# Patient Record
Sex: Female | Born: 1970
Health system: Southern US, Community
[De-identification: ages and names within clinical notes are randomized; demographics above are authoritative.]

## PROBLEM LIST (undated history)

## (undated) DIAGNOSIS — A599 Trichomoniasis, unspecified: Secondary | ICD-10-CM

## (undated) DIAGNOSIS — K219 Gastro-esophageal reflux disease without esophagitis: Secondary | ICD-10-CM

## (undated) DIAGNOSIS — Z9889 Other specified postprocedural states: Secondary | ICD-10-CM

## (undated) DIAGNOSIS — E559 Vitamin D deficiency, unspecified: Secondary | ICD-10-CM

## (undated) DIAGNOSIS — I639 Cerebral infarction, unspecified: Secondary | ICD-10-CM

## (undated) DIAGNOSIS — F4310 Post-traumatic stress disorder, unspecified: Secondary | ICD-10-CM

## (undated) DIAGNOSIS — I739 Peripheral vascular disease, unspecified: Secondary | ICD-10-CM

## (undated) DIAGNOSIS — I83891 Varicose veins of right lower extremities with other complications: Secondary | ICD-10-CM

## (undated) DIAGNOSIS — F32A Depression, unspecified: Secondary | ICD-10-CM

## (undated) DIAGNOSIS — G43909 Migraine, unspecified, not intractable, without status migrainosus: Secondary | ICD-10-CM

## (undated) DIAGNOSIS — R1031 Right lower quadrant pain: Secondary | ICD-10-CM

## (undated) DIAGNOSIS — F419 Anxiety disorder, unspecified: Secondary | ICD-10-CM

## (undated) DIAGNOSIS — E669 Obesity, unspecified: Secondary | ICD-10-CM

## (undated) DIAGNOSIS — M797 Fibromyalgia: Secondary | ICD-10-CM

## (undated) DIAGNOSIS — R112 Nausea with vomiting, unspecified: Secondary | ICD-10-CM

## (undated) DIAGNOSIS — F329 Major depressive disorder, single episode, unspecified: Secondary | ICD-10-CM

## (undated) DIAGNOSIS — M1990 Unspecified osteoarthritis, unspecified site: Secondary | ICD-10-CM

## (undated) HISTORY — DX: Right lower quadrant pain: R10.31

## (undated) HISTORY — PX: FRACTURE SURGERY: SHX138

## (undated) HISTORY — DX: Obesity, unspecified: E66.9

## (undated) HISTORY — DX: Vitamin D deficiency, unspecified: E55.9

## (undated) HISTORY — PX: VEIN SURGERY: SHX48

---

## 1997-11-10 ENCOUNTER — Other Ambulatory Visit: Admission: RE | Admit: 1997-11-10 | Discharge: 1997-11-10 | Payer: Self-pay | Admitting: Family Medicine

## 1997-11-11 ENCOUNTER — Inpatient Hospital Stay (HOSPITAL_COMMUNITY): Admission: EM | Admit: 1997-11-11 | Discharge: 1997-11-13 | Payer: Self-pay | Admitting: Emergency Medicine

## 1998-01-28 ENCOUNTER — Emergency Department (HOSPITAL_COMMUNITY): Admission: EM | Admit: 1998-01-28 | Discharge: 1998-01-28 | Payer: Self-pay | Admitting: Emergency Medicine

## 1998-12-10 ENCOUNTER — Emergency Department (HOSPITAL_COMMUNITY): Admission: EM | Admit: 1998-12-10 | Discharge: 1998-12-10 | Payer: Self-pay | Admitting: Emergency Medicine

## 1998-12-25 ENCOUNTER — Emergency Department (HOSPITAL_COMMUNITY): Admission: EM | Admit: 1998-12-25 | Discharge: 1998-12-25 | Payer: Self-pay | Admitting: Emergency Medicine

## 1999-07-19 ENCOUNTER — Encounter: Payer: Self-pay | Admitting: Surgery

## 1999-07-20 ENCOUNTER — Encounter (INDEPENDENT_AMBULATORY_CARE_PROVIDER_SITE_OTHER): Payer: Self-pay | Admitting: *Deleted

## 1999-07-20 ENCOUNTER — Ambulatory Visit (HOSPITAL_COMMUNITY): Admission: RE | Admit: 1999-07-20 | Discharge: 1999-07-20 | Payer: Self-pay | Admitting: Surgery

## 2000-01-12 ENCOUNTER — Emergency Department (HOSPITAL_COMMUNITY): Admission: EM | Admit: 2000-01-12 | Discharge: 2000-01-12 | Payer: Self-pay | Admitting: Internal Medicine

## 2000-02-21 ENCOUNTER — Emergency Department (HOSPITAL_COMMUNITY): Admission: EM | Admit: 2000-02-21 | Discharge: 2000-02-21 | Payer: Self-pay | Admitting: Emergency Medicine

## 2000-05-05 ENCOUNTER — Emergency Department (HOSPITAL_COMMUNITY): Admission: EM | Admit: 2000-05-05 | Discharge: 2000-05-06 | Payer: Self-pay | Admitting: Emergency Medicine

## 2000-05-06 ENCOUNTER — Other Ambulatory Visit: Admission: RE | Admit: 2000-05-06 | Discharge: 2000-05-06 | Payer: Self-pay | Admitting: Obstetrics

## 2002-07-11 ENCOUNTER — Emergency Department (HOSPITAL_COMMUNITY): Admission: EM | Admit: 2002-07-11 | Discharge: 2002-07-11 | Payer: Self-pay | Admitting: *Deleted

## 2002-07-11 ENCOUNTER — Encounter: Payer: Self-pay | Admitting: *Deleted

## 2003-01-05 ENCOUNTER — Emergency Department (HOSPITAL_COMMUNITY): Admission: EM | Admit: 2003-01-05 | Discharge: 2003-01-05 | Payer: Self-pay | Admitting: Emergency Medicine

## 2003-01-05 ENCOUNTER — Encounter: Payer: Self-pay | Admitting: Emergency Medicine

## 2003-08-19 ENCOUNTER — Emergency Department (HOSPITAL_COMMUNITY): Admission: EM | Admit: 2003-08-19 | Discharge: 2003-08-19 | Payer: Self-pay | Admitting: Emergency Medicine

## 2004-10-18 ENCOUNTER — Encounter: Admission: RE | Admit: 2004-10-18 | Discharge: 2004-10-18 | Payer: Self-pay | Admitting: Internal Medicine

## 2004-11-10 ENCOUNTER — Inpatient Hospital Stay (HOSPITAL_COMMUNITY): Admission: EM | Admit: 2004-11-10 | Discharge: 2004-11-13 | Payer: Self-pay | Admitting: Emergency Medicine

## 2005-07-21 ENCOUNTER — Emergency Department (HOSPITAL_COMMUNITY): Admission: EM | Admit: 2005-07-21 | Discharge: 2005-07-21 | Payer: Self-pay | Admitting: Emergency Medicine

## 2007-10-06 ENCOUNTER — Inpatient Hospital Stay (HOSPITAL_COMMUNITY): Admission: AD | Admit: 2007-10-06 | Discharge: 2007-10-06 | Payer: Self-pay | Admitting: Obstetrics & Gynecology

## 2009-07-15 DIAGNOSIS — M797 Fibromyalgia: Secondary | ICD-10-CM | POA: Insufficient documentation

## 2010-02-19 ENCOUNTER — Emergency Department (HOSPITAL_COMMUNITY): Admission: EM | Admit: 2010-02-19 | Discharge: 2010-02-20 | Payer: Self-pay | Admitting: Emergency Medicine

## 2010-07-15 DIAGNOSIS — A599 Trichomoniasis, unspecified: Secondary | ICD-10-CM

## 2010-07-15 HISTORY — DX: Trichomoniasis, unspecified: A59.9

## 2010-10-08 ENCOUNTER — Other Ambulatory Visit: Payer: Self-pay | Admitting: Obstetrics & Gynecology

## 2010-10-08 DIAGNOSIS — R1031 Right lower quadrant pain: Secondary | ICD-10-CM

## 2010-10-09 ENCOUNTER — Other Ambulatory Visit: Payer: Self-pay | Admitting: Internal Medicine

## 2010-10-09 DIAGNOSIS — K8020 Calculus of gallbladder without cholecystitis without obstruction: Secondary | ICD-10-CM

## 2010-10-12 ENCOUNTER — Inpatient Hospital Stay: Admission: RE | Admit: 2010-10-12 | Payer: Self-pay | Source: Ambulatory Visit

## 2010-10-19 ENCOUNTER — Ambulatory Visit (HOSPITAL_COMMUNITY)
Admission: RE | Admit: 2010-10-19 | Discharge: 2010-10-19 | Disposition: A | Discharge status: Home / Self Care | Payer: Self-pay | Source: Ambulatory Visit | Attending: Obstetrics & Gynecology | Admitting: Obstetrics & Gynecology

## 2010-10-19 DIAGNOSIS — R1031 Right lower quadrant pain: Secondary | ICD-10-CM | POA: Insufficient documentation

## 2010-10-19 MED ORDER — IOHEXOL 300 MG/ML  SOLN
100.0000 mL | Freq: Once | INTRAMUSCULAR | Status: AC | PRN
  Administered 2010-10-19: 100 mL via INTRAVENOUS

## 2010-11-20 ENCOUNTER — Ambulatory Visit: Payer: Self-pay | Admitting: Gastroenterology

## 2010-11-30 NOTE — Discharge Summary (Signed)
NAMEMARISELLA, PUCCIO                 ACCOUNT NO.:  [id number]   MEDICAL RECORD NO.:  [id number]          PATIENT TYPE:  INP   LOCATION:  0469                         FACILITY:  [facility]   PHYSICIAN:  Fleet Contras, M.D.    DATE OF BIRTH:  09-Oct-1970   DATE OF ADMISSION:  11/10/2004  DATE OF DISCHARGE:  11/13/2004                                 DISCHARGE SUMMARY   PRESENTATION:  Caitlin Valdez is a 40 year old African-American lady who was  admitted via the emergency room at [facility] with one day of  nausea, vomiting, and abdominal pain associated with diarrhea. She was seen  by Dr. Jeri Cos and felt to have developed some gastritis secondary  to alcohol use. She admitted to using alcohol the night before in the night  club of up to about four drinks. She has no history of peptic ulcer disease  and had not been using any NSAIDs. Due to the persistent nature of her  symptomatology, a CT scan of the abdomen was obtained, and this was  essentially within normal limits. Her initial laboratory data showed a white  count of 11.1, hemoglobin was 11.8, platelets 244, potassium 3.6, BUN 7,  creatinine 1.0, glucose 108. She was therefore admitted for intravenous IV  fluids and protein pump inhibitors.   HOSPITAL COURSE:  On admission, the patient was started on intravenous  fluids with D5 normal saline. She was initially kept on clear liquids only,  and this was slowly advanced as tolerated. Protonix 40 mg IV was started.  This was changed to p.o. She reported that she was feeling depressed, had  been out of her medication Paxil, and this was restarted. Also received  Phenergan 12.5 mg q.6h. for nausea and vomiting. Lipase and amylase were  within normal limits. Her symptoms improved, and on Nov 13, 2004, he had no  further nausea or vomiting. She still has some burning pain in her stomach.  No further diarrhea. She was tolerating full oral intake. Vital signs were  stable, and she was  well hydrated. She was slightly tender in the  epigastrium with no masses, no guarding, and no rebound. She was therefore  considered stable for discharge.   DISCHARGE DIAGNOSES:  1.  Acute alcoholic gastritis.  2.  Dehydration.  3.  History of depression.   CONDITION ON DISCHARGE:  Stable.   DISPOSITION:  For home.   FOLLOW UP:  With Dr. Fleet Contras in one week.   DISCHARGE MEDICATIONS:  1.  Protonix 40 mg once a day.  2.  Imodium 2 mg po qloose bm.   She was to continue her regular home medications that included Paxil 40 mg  once a day.      EA/MEDQ  D:  11/28/2004  T:  11/28/2004  Job:  [id number]

## 2011-01-22 ENCOUNTER — Emergency Department (HOSPITAL_COMMUNITY)
Admission: EM | Admit: 2011-01-22 | Discharge: 2011-01-22 | Disposition: A | Discharge status: Home / Self Care | Payer: Self-pay | Attending: Emergency Medicine | Admitting: Emergency Medicine

## 2011-01-22 DIAGNOSIS — R209 Unspecified disturbances of skin sensation: Secondary | ICD-10-CM | POA: Insufficient documentation

## 2011-01-22 DIAGNOSIS — IMO0001 Reserved for inherently not codable concepts without codable children: Secondary | ICD-10-CM | POA: Insufficient documentation

## 2011-01-22 DIAGNOSIS — M79609 Pain in unspecified limb: Secondary | ICD-10-CM | POA: Insufficient documentation

## 2011-03-24 ENCOUNTER — Emergency Department (HOSPITAL_COMMUNITY): Payer: Self-pay

## 2011-03-24 ENCOUNTER — Emergency Department (HOSPITAL_COMMUNITY)
Admission: EM | Admit: 2011-03-24 | Discharge: 2011-03-24 | Disposition: A | Discharge status: Home / Self Care | Payer: Self-pay | Attending: Emergency Medicine | Admitting: Emergency Medicine

## 2011-03-24 DIAGNOSIS — J069 Acute upper respiratory infection, unspecified: Secondary | ICD-10-CM | POA: Insufficient documentation

## 2011-03-24 DIAGNOSIS — IMO0001 Reserved for inherently not codable concepts without codable children: Secondary | ICD-10-CM | POA: Insufficient documentation

## 2011-03-24 DIAGNOSIS — A499 Bacterial infection, unspecified: Secondary | ICD-10-CM | POA: Insufficient documentation

## 2011-03-24 DIAGNOSIS — H9209 Otalgia, unspecified ear: Secondary | ICD-10-CM | POA: Insufficient documentation

## 2011-03-24 DIAGNOSIS — B9689 Other specified bacterial agents as the cause of diseases classified elsewhere: Secondary | ICD-10-CM | POA: Insufficient documentation

## 2011-03-24 DIAGNOSIS — N760 Acute vaginitis: Secondary | ICD-10-CM | POA: Insufficient documentation

## 2011-03-24 DIAGNOSIS — N949 Unspecified condition associated with female genital organs and menstrual cycle: Secondary | ICD-10-CM | POA: Insufficient documentation

## 2011-03-24 DIAGNOSIS — B9789 Other viral agents as the cause of diseases classified elsewhere: Secondary | ICD-10-CM | POA: Insufficient documentation

## 2011-03-24 DIAGNOSIS — N83209 Unspecified ovarian cyst, unspecified side: Secondary | ICD-10-CM | POA: Insufficient documentation

## 2011-03-24 LAB — WET PREP, GENITAL
Trich, Wet Prep: NONE SEEN
Yeast Wet Prep HPF POC: NONE SEEN

## 2011-03-24 LAB — URINALYSIS, ROUTINE W REFLEX MICROSCOPIC
Bilirubin Urine: NEGATIVE
Glucose, UA: NEGATIVE mg/dL
Ketones, ur: NEGATIVE mg/dL
Leukocytes, UA: NEGATIVE
Nitrite: NEGATIVE
Protein, ur: NEGATIVE mg/dL
Specific Gravity, Urine: 1.02 (ref 1.005–1.030)
Urobilinogen, UA: 0.2 mg/dL (ref 0.0–1.0)
pH: 6 (ref 5.0–8.0)

## 2011-03-24 LAB — DIFFERENTIAL
Basophils Absolute: 0 10*3/uL (ref 0.0–0.1)
Basophils Relative: 0 % (ref 0–1)
Eosinophils Absolute: 0.2 10*3/uL (ref 0.0–0.7)
Eosinophils Relative: 2 % (ref 0–5)
Lymphocytes Relative: 41 % (ref 12–46)
Lymphs Abs: 3.5 10*3/uL (ref 0.7–4.0)
Monocytes Absolute: 1 10*3/uL (ref 0.1–1.0)
Monocytes Relative: 11 % (ref 3–12)
Neutro Abs: 3.8 10*3/uL (ref 1.7–7.7)
Neutrophils Relative %: 45 % (ref 43–77)

## 2011-03-24 LAB — CBC
HCT: 39.6 % (ref 36.0–46.0)
Hemoglobin: 14 g/dL (ref 12.0–15.0)
MCH: 33.2 pg (ref 26.0–34.0)
MCHC: 35.4 g/dL (ref 30.0–36.0)
MCV: 93.8 fL (ref 78.0–100.0)
Platelets: 277 10*3/uL (ref 150–400)
RBC: 4.22 MIL/uL (ref 3.87–5.11)
RDW: 12.7 % (ref 11.5–15.5)
WBC: 8.6 10*3/uL (ref 4.0–10.5)

## 2011-03-24 LAB — BASIC METABOLIC PANEL
BUN: 11 mg/dL (ref 6–23)
CO2: 25 mEq/L (ref 19–32)
Calcium: 9.7 mg/dL (ref 8.4–10.5)
Chloride: 99 mEq/L (ref 96–112)
Creatinine, Ser: 0.78 mg/dL (ref 0.50–1.10)
GFR calc Af Amer: >60 mL/min (ref >60)
GFR calc non Af Amer: >60 mL/min (ref >60)
Glucose, Bld: 91 mg/dL (ref 70–99)
Potassium: 4.1 mEq/L (ref 3.5–5.1)
Sodium: 133 mEq/L — ABNORMAL LOW (ref 135–145)

## 2011-03-24 LAB — POCT PREGNANCY, URINE: Preg Test, Ur: NEGATIVE

## 2011-03-24 LAB — URINE MICROSCOPIC-ADD ON

## 2011-03-25 LAB — GC/CHLAMYDIA PROBE AMP, GENITAL
Chlamydia, DNA Probe: NEGATIVE
GC Probe Amp, Genital: NEGATIVE

## 2011-04-08 LAB — URINALYSIS, ROUTINE W REFLEX MICROSCOPIC
Bilirubin Urine: NEGATIVE
Glucose, UA: NEGATIVE
Ketones, ur: NEGATIVE
Leukocytes, UA: NEGATIVE
Nitrite: NEGATIVE
Protein, ur: NEGATIVE
Specific Gravity, Urine: 1.025
Urobilinogen, UA: 0.2
pH: 6

## 2011-04-08 LAB — URINE MICROSCOPIC-ADD ON

## 2011-04-08 LAB — POCT PREGNANCY, URINE
Operator id: 22333
Preg Test, Ur: NEGATIVE

## 2011-06-19 ENCOUNTER — Encounter (HOSPITAL_COMMUNITY): Payer: Self-pay | Admitting: Emergency Medicine

## 2011-06-19 ENCOUNTER — Emergency Department (HOSPITAL_COMMUNITY)
Admission: EM | Admit: 2011-06-19 | Discharge: 2011-06-19 | Disposition: A | Discharge status: Home / Self Care | Payer: Self-pay | Attending: Emergency Medicine | Admitting: Emergency Medicine

## 2011-06-19 DIAGNOSIS — B86 Scabies: Secondary | ICD-10-CM | POA: Insufficient documentation

## 2011-06-19 DIAGNOSIS — F17200 Nicotine dependence, unspecified, uncomplicated: Secondary | ICD-10-CM | POA: Insufficient documentation

## 2011-06-19 DIAGNOSIS — M5412 Radiculopathy, cervical region: Secondary | ICD-10-CM | POA: Insufficient documentation

## 2011-06-19 HISTORY — DX: Fibromyalgia: M79.7

## 2011-06-19 MED ORDER — PREDNISONE 50 MG PO TABS: 50.0000 mg | ORAL_TABLET | Freq: Every day | ORAL | Status: AC

## 2011-06-19 MED ORDER — NAPROXEN 500 MG PO TABS: 500.0000 mg | ORAL_TABLET | Freq: Two times a day (BID) | ORAL | Status: DC

## 2011-06-19 MED ORDER — PERMETHRIN 5 % EX CREA: TOPICAL_CREAM | CUTANEOUS | Status: AC

## 2011-06-19 MED ORDER — HYDROCODONE-ACETAMINOPHEN 5-325 MG PO TABS: 1.0000 | ORAL_TABLET | ORAL | Status: AC | PRN

## 2011-06-19 MED ORDER — DIAZEPAM 5 MG PO TABS: 5.0000 mg | ORAL_TABLET | Freq: Two times a day (BID) | ORAL | Status: AC

## 2011-06-19 MED ORDER — PREDNISONE 20 MG PO TABS
60.0000 mg | ORAL_TABLET | Freq: Once | ORAL | Status: AC
  Administered 2011-06-19: 60 mg via ORAL
  Filled 2011-06-19: qty 3

## 2011-06-19 NOTE — ED Notes (Signed)
States that she has a tingling sensation that originates in her hands, up her arms, down to legs. States that she also has some areas of dry skin generalized over her body that itch. States that when she got into a tub of water to soothe tingling in body she felt as though her body was on fire.

## 2011-06-19 NOTE — ED Notes (Signed)
Pt reports one week of tingling and burning in left extremities; Pt also has small dark "sores" developing on various parts of body; reports she is concerned she has lupus

## 2011-06-19 NOTE — ED Notes (Signed)
Pt. Resting quietly. Awaiting initial MD assessment. Continues to c/o tingling to L arm.

## 2011-06-19 NOTE — ED Provider Notes (Signed)
History     CSN: [id number] Arrival date & time: 06/19/2011  4:51 PM   First MD Initiated Contact with Patient 06/19/11 1904      Chief Complaint  Patient presents with  . Skin Ulcer  . Tingling    (Consider location/radiation/quality/duration/timing/severity/associated sxs/prior treatment) HPI Comments: Patient complains of one to 2 weeks of diffuse rash over her body that is pruritic in nature.  She states she initially noticed it between her fingers and on her heels.  She is now noted coming up her wrists upper arms and on her back as well.  She has no fevers, nausea, vomiting, abdominal pain or other systemic symptoms of illness with this.  Patient has not tried any medications to help with this besides moisturizers at home.  Patient secondarily also complains of left arm tingling and pain.  She notes that certain positions make it worse such as raising her arm overhead.  She's not a trauma or injuries.  No prior neck surgeries or injuries.  Patient has no weakness in that arm but is noting that she's difficulty sleeping due to the pain.  Patient is a 40 y.o. female presenting with rash. The history is provided by the patient.  Rash  This is a new problem. The problem has been gradually worsening. The problem is associated with nothing. There has been no fever.    Past Medical History  Diagnosis Date  . Abdominal pain, right lower quadrant   . Fibromyalgia     Past Surgical History  Procedure Date  . Cesarean section   . Vein surgery   . Fracture surgery     No family history on file.  History  Substance Use Topics  . Smoking status: Current Everyday Smoker -- 1.0 packs/day  . Smokeless tobacco: Not on file  . Alcohol Use: No    OB History    Grav Para Term Preterm Abortions TAB SAB Ect Mult Living                  Review of Systems  Constitutional: Negative.  Negative for fever and chills.  HENT: Negative.   Eyes: Negative.  Negative for discharge and  redness.  Respiratory: Negative.  Negative for cough and shortness of breath.   Cardiovascular: Negative.  Negative for chest pain.  Gastrointestinal: Negative.  Negative for nausea, vomiting, abdominal pain and diarrhea.  Genitourinary: Negative.  Negative for dysuria and vaginal discharge.  Musculoskeletal: Negative for back pain.  Skin: Positive for rash. Negative for color change.  Neurological: Negative.  Negative for syncope and headaches.  Hematological: Negative.  Negative for adenopathy.  Psychiatric/Behavioral: Negative.  Negative for confusion.  All other systems reviewed and are negative.    Allergies  Review of patient's allergies indicates no known allergies.  Home Medications   Current Outpatient Rx  Name Route Sig Dispense Refill  . CYCLOBENZAPRINE HCL 10 MG PO TABS Oral Take 10 mg by mouth 3 (three) times daily as needed. For pain.    [name] GABAPENTIN 300 MG PO CAPS Oral Take 300 mg by mouth 3 (three) times daily.      Carma Leaven M PLUS PO TABS Oral Take 1 tablet by mouth daily.        BP 136/90  Pulse 85  Temp(Src) 97.9 F (36.6 C) (Oral)  Resp 18  Ht 5\' 3"  (1.6 m)  Wt 165 lb (74.844 kg)  BMI 29.23 kg/m2  SpO2 100%  LMP 05/26/2011  Physical Exam  Constitutional: She  is oriented to person, place, and time. She appears well-developed and well-nourished.  Non-toxic appearance. She does not have a sickly appearance.  HENT:  Head: Normocephalic and atraumatic.  Eyes: Conjunctivae, EOM and lids are normal. Pupils are equal, round, and reactive to light. No scleral icterus.  Neck: Trachea normal and normal range of motion. Neck supple.  Cardiovascular: Normal rate, regular rhythm and normal heart sounds.   Pulmonary/Chest: Effort normal and breath sounds normal. No respiratory distress. She has no wheezes. She has no rales.  Abdominal: Soft. Normal appearance. There is no tenderness. There is no rebound, no guarding and no CVA tenderness.  Musculoskeletal: Normal  range of motion.       Patient with no significant change in pain with axial loading but does have worsening left arm pain with raising her arm above shoulder height.  Palpable radial pulse left arm.  Capillary refill less than 2 seconds.  Sensation is intact.  Neurological: She is alert and oriented to person, place, and time. She has normal strength.  Skin: Skin is warm, dry and intact. Rash noted.       Patient has diffuse rash between her fingers on her wrists and upper arms, legs and on her back.  There is no significant fluctuance or erythema to these wounds.  They're small bumps with mild excoriation and a few spots where she admits to scratching more frequent  Psychiatric: She has a normal mood and affect. Her behavior is normal. Thought content normal.    ED Course  Procedures (including critical care time)  Labs Reviewed - No data to display No results found.   No diagnosis found.    MDM  Patient presents with symptoms concerning for possible scabies.  Patient has diffuse rash and itching that she initially noticed between her fingers and toes.  Patient has no fevers or other systemic signs of illness.  I will treat patient with permethrin cream.  She secondarily also notes some tingling and pain down her left arm that is worse with certain positions such as raising her arm overhead.  This could be consistent with a possible cervical radiculopathy patient has no weakness or neurologic deficit at this time to necessitate emergent imaging.  Will place patient on steroids and pain medication and I've advised her to followup with her primary care physician if this is not improving.        Nat Christen, MD 06/19/11 [phone]

## 2011-06-19 NOTE — ED Notes (Signed)
MD at bedside. 

## 2011-08-12 ENCOUNTER — Encounter (HOSPITAL_COMMUNITY): Payer: Self-pay | Admitting: Emergency Medicine

## 2011-08-12 ENCOUNTER — Emergency Department (HOSPITAL_COMMUNITY)
Admission: EM | Admit: 2011-08-12 | Discharge: 2011-08-12 | Disposition: A | Discharge status: Home / Self Care | Payer: Self-pay | Attending: Emergency Medicine | Admitting: Emergency Medicine

## 2011-08-12 DIAGNOSIS — F17200 Nicotine dependence, unspecified, uncomplicated: Secondary | ICD-10-CM | POA: Insufficient documentation

## 2011-08-12 DIAGNOSIS — R112 Nausea with vomiting, unspecified: Secondary | ICD-10-CM | POA: Insufficient documentation

## 2011-08-12 LAB — DIFFERENTIAL
Basophils Absolute: 0 10*3/uL (ref 0.0–0.1)
Basophils Relative: 0 % (ref 0–1)
Eosinophils Absolute: 0 10*3/uL (ref 0.0–0.7)
Eosinophils Relative: 1 % (ref 0–5)
Lymphocytes Relative: 12 % (ref 12–46)
Lymphs Abs: 0.7 10*3/uL (ref 0.7–4.0)
Monocytes Absolute: 0.5 10*3/uL (ref 0.1–1.0)
Monocytes Relative: 9 % (ref 3–12)
Neutro Abs: 4.7 10*3/uL (ref 1.7–7.7)
Neutrophils Relative %: 79 % — ABNORMAL HIGH (ref 43–77)

## 2011-08-12 LAB — URINALYSIS, ROUTINE W REFLEX MICROSCOPIC
Bilirubin Urine: NEGATIVE
Glucose, UA: NEGATIVE mg/dL
Leukocytes, UA: NEGATIVE
Nitrite: NEGATIVE
Protein, ur: NEGATIVE mg/dL
Specific Gravity, Urine: 1.026 (ref 1.005–1.030)
Urobilinogen, UA: 0.2 mg/dL (ref 0.0–1.0)
pH: 5.5 (ref 5.0–8.0)

## 2011-08-12 LAB — COMPREHENSIVE METABOLIC PANEL
ALT: 17 U/L (ref 0–35)
AST: 21 U/L (ref 0–37)
Albumin: 4.1 g/dL (ref 3.5–5.2)
Alkaline Phosphatase: 86 U/L (ref 39–117)
BUN: 9 mg/dL (ref 6–23)
CO2: 23 mEq/L (ref 19–32)
Calcium: 9.5 mg/dL (ref 8.4–10.5)
Chloride: 100 mEq/L (ref 96–112)
Creatinine, Ser: 0.76 mg/dL (ref 0.50–1.10)
GFR calc Af Amer: >90 mL/min (ref >90)
GFR calc non Af Amer: >90 mL/min (ref >90)
Glucose, Bld: 90 mg/dL (ref 70–99)
Potassium: 4.2 mEq/L (ref 3.5–5.1)
Sodium: 133 mEq/L — ABNORMAL LOW (ref 135–145)
Total Bilirubin: 0.3 mg/dL (ref 0.3–1.2)
Total Protein: 8.3 g/dL (ref 6.0–8.3)

## 2011-08-12 LAB — CBC
HCT: 37.8 % (ref 36.0–46.0)
Hemoglobin: 12.7 g/dL (ref 12.0–15.0)
MCH: 31.4 pg (ref 26.0–34.0)
MCHC: 33.6 g/dL (ref 30.0–36.0)
MCV: 93.3 fL (ref 78.0–100.0)
Platelets: 241 10*3/uL (ref 150–400)
RBC: 4.05 MIL/uL (ref 3.87–5.11)
RDW: 12 % (ref 11.5–15.5)
WBC: 5.9 10*3/uL (ref 4.0–10.5)

## 2011-08-12 LAB — URINE MICROSCOPIC-ADD ON

## 2011-08-12 LAB — PREGNANCY, URINE: Preg Test, Ur: NEGATIVE

## 2011-08-12 MED ORDER — ONDANSETRON HCL 4 MG/2ML IJ SOLN
4.0000 mg | Freq: Once | INTRAMUSCULAR | Status: AC
  Administered 2011-08-12: 4 mg via INTRAVENOUS
  Filled 2011-08-12: qty 2

## 2011-08-12 MED ORDER — DIPHENHYDRAMINE HCL 50 MG/ML IJ SOLN
25.0000 mg | Freq: Once | INTRAMUSCULAR | Status: AC
  Administered 2011-08-12: 25 mg via INTRAVENOUS
  Filled 2011-08-12: qty 1

## 2011-08-12 MED ORDER — DROPERIDOL 2.5 MG/ML IJ SOLN
1.2500 mg | Freq: Once | INTRAMUSCULAR | Status: AC
  Administered 2011-08-12: 1.25 mg via INTRAVENOUS
  Filled 2011-08-12: qty 0.5

## 2011-08-12 MED ORDER — LORAZEPAM 1 MG PO TABS
1.0000 mg | ORAL_TABLET | Freq: Once | ORAL | Status: AC
  Administered 2011-08-12: 1 mg via ORAL
  Filled 2011-08-12: qty 1

## 2011-08-12 MED ORDER — PANTOPRAZOLE SODIUM 40 MG IV SOLR
40.0000 mg | Freq: Once | INTRAVENOUS | Status: AC
  Administered 2011-08-12: 40 mg via INTRAVENOUS
  Filled 2011-08-12: qty 40

## 2011-08-12 MED ORDER — ONDANSETRON HCL 4 MG PO TABS: 4.0000 mg | ORAL_TABLET | Freq: Four times a day (QID) | ORAL | Status: AC

## 2011-08-12 MED ORDER — KETOROLAC TROMETHAMINE 30 MG/ML IJ SOLN
30.0000 mg | Freq: Once | INTRAMUSCULAR | Status: AC
  Administered 2011-08-12: 30 mg via INTRAVENOUS
  Filled 2011-08-12: qty 1

## 2011-08-12 NOTE — ED Notes (Signed)
Per ems, the patient ate some food that she cooked last night and was fine with it until 3pm today when she ate it.. States took 8 mg zofran at home prior to coming in this evening  1830.

## 2011-08-12 NOTE — ED Provider Notes (Signed)
History     CSN: [id number]  Arrival date & time 08/12/11  1903   First MD Initiated Contact with Patient 08/12/11 1915      Chief Complaint  Patient presents with  . Emesis    (Consider location/radiation/quality/duration/timing/severity/associated sxs/prior treatment) HPI Comments: Patient arrives via EMS with nausea and vomiting started around 3 PM today states she ate some ribs that she cut herself. No one else has gotten sick. She denies any diarrhea, abdominal pain, fever, chest pain or shortness of breath. She denies any difficulty with urination about vaginal bleeding or discharge. She's vomited countless times and seen some streaks of blood.  The history is provided by the patient.    Past Medical History  Diagnosis Date  . Abdominal pain, right lower quadrant   . Fibromyalgia     Past Surgical History  Procedure Date  . Cesarean section   . Vein surgery   . Fracture surgery     History reviewed. No pertinent family history.  History  Substance Use Topics  . Smoking status: Current Everyday Smoker -- 1.0 packs/day  . Smokeless tobacco: Not on file  . Alcohol Use: No    OB History    Grav Para Term Preterm Abortions TAB SAB Ect Mult Living                  Review of Systems  Constitutional: Positive for activity change and appetite change. Negative for fever and fatigue.  HENT: Negative for congestion and rhinorrhea.   Respiratory: Negative for cough, chest tightness and shortness of breath.   Cardiovascular: Negative for chest pain.  Gastrointestinal: Positive for nausea and vomiting. Negative for abdominal pain and diarrhea.  Genitourinary: Negative for dysuria, hematuria, vaginal bleeding and vaginal discharge.  Musculoskeletal: Negative for back pain.  Skin: Negative for rash.  Neurological: Negative for weakness and headaches.    Allergies  Review of patient's allergies indicates no known allergies.  Home Medications   Current Outpatient  Rx  Name Route Sig Dispense Refill  . CYCLOBENZAPRINE HCL 10 MG PO TABS Oral Take 10 mg by mouth 3 (three) times daily as needed. For pain.    [name] GABAPENTIN 300 MG PO CAPS Oral Take 300 mg by mouth 3 (three) times daily.      Carma Leaven M PLUS PO TABS Oral Take 1 tablet by mouth daily.      [name] NAPROXEN 500 MG PO TABS Oral Take 1 tablet (500 mg total) by mouth 2 (two) times daily. 30 tablet 0  . ONDANSETRON HCL 4 MG PO TABS Oral Take 1 tablet (4 mg total) by mouth every 6 (six) hours. 12 tablet 0    BP 107/57  Pulse 93  Temp(Src) 98.3 F (36.8 C) (Oral)  Resp 20  SpO2 100%  Physical Exam  Constitutional: She is oriented to person, place, and time. She appears well-developed and well-nourished. No distress.  HENT:  Head: Normocephalic and atraumatic.  Mouth/Throat: Oropharynx is clear and moist. No oropharyngeal exudate.  Eyes: Conjunctivae are normal. Pupils are equal, round, and reactive to light.  Neck: Normal range of motion. Neck supple.  Cardiovascular: Normal rate, regular rhythm and normal heart sounds.   Pulmonary/Chest: Effort normal and breath sounds normal. No respiratory distress.  Abdominal: Soft. There is no tenderness. There is no rebound and no guarding.  Musculoskeletal: Normal range of motion. She exhibits no edema and no tenderness.  Neurological: She is alert and oriented to person, place, and time. No cranial  nerve deficit.  Skin: Skin is warm.    ED Course  Procedures (including critical care time)  Labs Reviewed  DIFFERENTIAL - Abnormal; Notable for the following:    Neutrophils Relative 79 (*)    All other components within normal limits  COMPREHENSIVE METABOLIC PANEL - Abnormal; Notable for the following:    Sodium 133 (*)    All other components within normal limits  URINALYSIS, ROUTINE W REFLEX MICROSCOPIC - Abnormal; Notable for the following:    Hgb urine dipstick LARGE (*)    Ketones, ur TRACE (*)    All other components within normal limits  URINE  MICROSCOPIC-ADD ON - Abnormal; Notable for the following:    Squamous Epithelial / LPF FEW (*)    Bacteria, UA MANY (*)    All other components within normal limits  CBC  PREGNANCY, URINE   No results found.   1. Nausea and vomiting       MDM  Nausea and vomiting. Abdominal exam is soft and benign.  We'll initiate IV hydration, antiemetics.  Patient given IV hydration and his has tolerated by mouth in ED. No further vomiting. Abdomen is soft and nontender.       Glynn Octave, MD 08/12/11 [phone]

## 2011-08-12 NOTE — Discharge Instructions (Signed)
Nausea and Vomiting Nausea is a sick feeling that often comes before throwing up (vomiting). Vomiting is a reflex where stomach contents come out of your mouth. Vomiting can cause severe loss of body fluids (dehydration). Children and elderly adults can become dehydrated quickly, especially if they also have diarrhea. Nausea and vomiting are symptoms of a condition or disease. It is important to find the cause of your symptoms. CAUSES   Direct irritation of the stomach lining. This irritation can result from increased acid production (gastroesophageal reflux disease), infection, food poisoning, taking certain medicines (such as nonsteroidal anti-inflammatory drugs), alcohol use, or tobacco use.   Signals from the brain.These signals could be caused by a headache, heat exposure, an inner ear disturbance, increased pressure in the brain from injury, infection, a tumor, or a concussion, pain, emotional stimulus, or metabolic problems.   An obstruction in the gastrointestinal tract (bowel obstruction).   Illnesses such as diabetes, hepatitis, gallbladder problems, appendicitis, kidney problems, cancer, sepsis, atypical symptoms of a heart attack, or eating disorders.   Medical treatments such as chemotherapy and radiation.   Receiving medicine that makes you sleep (general anesthetic) during surgery.  DIAGNOSIS Your caregiver may ask for tests to be done if the problems do not improve after a few days. Tests may also be done if symptoms are severe or if the reason for the nausea and vomiting is not clear. Tests may include:  Urine tests.   Blood tests.   Stool tests.   Cultures (to look for evidence of infection).   X-rays or other imaging studies.  Test results can help your caregiver make decisions about treatment or the need for additional tests. TREATMENT You need to stay well hydrated. Drink frequently but in small amounts.You may wish to drink water, sports drinks, clear broth, or  eat frozen ice pops or gelatin dessert to help stay hydrated.When you eat, eating slowly may help prevent nausea.There are also some antinausea medicines that may help prevent nausea. HOME CARE INSTRUCTIONS   Take all medicine as directed by your caregiver.   If you do not have an appetite, do not force yourself to eat. However, you must continue to drink fluids.   If you have an appetite, eat a normal diet unless your caregiver tells you differently.   Eat a variety of complex carbohydrates (rice, wheat, potatoes, bread), lean meats, yogurt, fruits, and vegetables.   Avoid high-fat foods because they are more difficult to digest.   Drink enough water and fluids to keep your urine clear or pale yellow.   If you are dehydrated, ask your caregiver for specific rehydration instructions. Signs of dehydration may include:   Severe thirst.   Dry lips and mouth.   Dizziness.   Dark urine.   Decreasing urine frequency and amount.   Confusion.   Rapid breathing or pulse.  SEEK IMMEDIATE MEDICAL CARE IF:   You have blood or brown flecks (like coffee grounds) in your vomit.   You have black or bloody stools.   You have a severe headache or stiff neck.   You are confused.   You have severe abdominal pain.   You have chest pain or trouble breathing.   You do not urinate at least once every 8 hours.   You develop cold or clammy skin.   You continue to vomit for longer than 24 to 48 hours.   You have a fever.  MAKE SURE YOU:   Understand these instructions.   Will watch your   condition.   Will get help right away if you are not doing well or get worse.  Document Released: 07/01/2005 Document Revised: 03/13/2011 Document Reviewed: 11/28/2010 [employer] Patient Information 2012 [employer]. 

## 2011-08-12 NOTE — ED Notes (Signed)
Patient discharged home with son

## 2011-12-18 ENCOUNTER — Encounter (HOSPITAL_COMMUNITY): Payer: Self-pay

## 2011-12-18 ENCOUNTER — Emergency Department (HOSPITAL_COMMUNITY)
Admission: EM | Admit: 2011-12-18 | Discharge: 2011-12-18 | Disposition: A | Discharge status: Home / Self Care | Payer: Self-pay | Attending: Emergency Medicine | Admitting: Emergency Medicine

## 2011-12-18 ENCOUNTER — Emergency Department (HOSPITAL_COMMUNITY): Payer: Self-pay

## 2011-12-18 DIAGNOSIS — R110 Nausea: Secondary | ICD-10-CM | POA: Insufficient documentation

## 2011-12-18 DIAGNOSIS — B86 Scabies: Secondary | ICD-10-CM | POA: Insufficient documentation

## 2011-12-18 DIAGNOSIS — F17200 Nicotine dependence, unspecified, uncomplicated: Secondary | ICD-10-CM | POA: Insufficient documentation

## 2011-12-18 DIAGNOSIS — R51 Headache: Secondary | ICD-10-CM | POA: Insufficient documentation

## 2011-12-18 HISTORY — DX: Migraine, unspecified, not intractable, without status migrainosus: G43.909

## 2011-12-18 LAB — URINE MICROSCOPIC-ADD ON

## 2011-12-18 LAB — POCT PREGNANCY, URINE: Preg Test, Ur: NEGATIVE

## 2011-12-18 LAB — URINALYSIS, ROUTINE W REFLEX MICROSCOPIC
Bilirubin Urine: NEGATIVE
Glucose, UA: NEGATIVE mg/dL
Ketones, ur: NEGATIVE mg/dL
Leukocytes, UA: NEGATIVE
Nitrite: NEGATIVE
Protein, ur: NEGATIVE mg/dL
Specific Gravity, Urine: 1.019 (ref 1.005–1.030)
Urobilinogen, UA: 0.2 mg/dL (ref 0.0–1.0)
pH: 5.5 (ref 5.0–8.0)

## 2011-12-18 LAB — POCT I-STAT, CHEM 8
BUN: 4 mg/dL — ABNORMAL LOW (ref 6–23)
Calcium, Ion: 1.28 mmol/L (ref 1.12–1.32)
Chloride: 106 mEq/L (ref 96–112)
Creatinine, Ser: 0.8 mg/dL (ref 0.50–1.10)
Glucose, Bld: 87 mg/dL (ref 70–99)
HCT: 41 % (ref 36.0–46.0)
Hemoglobin: 13.9 g/dL (ref 12.0–15.0)
Potassium: 4.3 mEq/L (ref 3.5–5.1)
Sodium: 141 mEq/L (ref 135–145)
TCO2: 25 mmol/L (ref 0–100)

## 2011-12-18 MED ORDER — PERMETHRIN 5 % EX CREA: TOPICAL_CREAM | CUTANEOUS | Status: AC

## 2011-12-18 MED ORDER — BUTALBITAL-APAP-CAFFEINE 50-325-40 MG PO TABS: 1.0000 | ORAL_TABLET | Freq: Four times a day (QID) | ORAL | Status: DC | PRN

## 2011-12-18 NOTE — Discharge Instructions (Signed)

## 2011-12-18 NOTE — ED Notes (Signed)
Pt informed Caitlin Valdez, Charity fundraiser, that she had run out of her headache medications and had a migraine and that is why she is here.

## 2011-12-18 NOTE — ED Notes (Signed)
Pt given ginger ale and crackers per request.  

## 2011-12-18 NOTE — ED Notes (Signed)
Pt. Had a white drainage coming from her breast yesterday and they are tender

## 2011-12-18 NOTE — ED Notes (Signed)
Pt returned from CT scan; asking for ginger ale with no ice

## 2011-12-18 NOTE — ED Provider Notes (Signed)
History     CSN: [id number]  Arrival date & time 12/18/11  1019   None     Chief Complaint  Patient presents with  . Headache    (Consider location/radiation/quality/duration/timing/severity/associated sxs/prior treatment) Patient is a 41 y.o. female presenting with headaches. The history is provided by the patient. A language interpreter was used.  Headache  This is a new problem. The problem has been gradually worsening. The headache is associated with bright light. The pain is located in the frontal region. The quality of the pain is described as sharp. The pain is at a severity of 8/10. The pain is moderate. The pain radiates to the upper back. Associated symptoms include nausea. Pertinent negatives include no anorexia, no orthopnea and no palpitations. She has tried nothing for the symptoms.  Pt has multiple concerns.  Pt has an itching rash on fingers.  Pt has a history of scabies earlier this year.  Pt is worried that she is diabetic.  Pt is worried that she has lupus. Pt concerned about headaches.  Pt has seen Dr. Concepcion Elk in the past.  Pt also reports notice milk from breast when she squeezes.    Past Medical History  Diagnosis Date  . Abdominal pain, right lower quadrant   . Fibromyalgia   . Migraine     Past Surgical History  Procedure Date  . Cesarean section   . Vein surgery   . Fracture surgery     History reviewed. No pertinent family history.  History  Substance Use Topics  . Smoking status: Current Everyday Smoker -- 1.0 packs/day  . Smokeless tobacco: Not on file  . Alcohol Use: No    OB History    Grav Para Term Preterm Abortions TAB SAB Ect Mult Living                  Review of Systems  Constitutional: Positive for fatigue.  Cardiovascular: Negative for palpitations and orthopnea.  Gastrointestinal: Positive for nausea. Negative for anorexia.  Skin: Positive for rash.  Neurological: Positive for light-headedness and headaches.    Allergies    Review of patient's allergies indicates no known allergies.  Home Medications   Current Outpatient Rx  Name Route Sig Dispense Refill  . EXCEDRIN MIGRAINE PO Oral Take 1-2 tablets by mouth 3 (three) times daily as needed. For headache    . GABAPENTIN 300 MG PO CAPS Oral Take 300 mg by mouth 3 (three) times daily.        BP 120/62  Pulse 75  Temp(Src) 98.7 F (37.1 C) (Oral)  Resp 18  SpO2 99%  LMP 12/06/2011  Physical Exam  Nursing note and vitals reviewed. Constitutional: She is oriented to person, place, and time. She appears well-developed and well-nourished.  HENT:  Head: Normocephalic and atraumatic.  Right Ear: External ear normal.  Left Ear: External ear normal.  Nose: Nose normal.  Mouth/Throat: Oropharynx is clear and moist.  Eyes: Conjunctivae and EOM are normal. Pupils are equal, round, and reactive to light.  Neck: Normal range of motion. Neck supple.  Cardiovascular: Normal rate and normal heart sounds.   Pulmonary/Chest: Effort normal and breath sounds normal.  Abdominal: Soft. Bowel sounds are normal.  Musculoskeletal: Normal range of motion.  Neurological: She is alert and oriented to person, place, and time. She has normal reflexes. She displays normal reflexes. No cranial nerve deficit. Coordination normal.  Skin: Skin is warm and dry.       Rash between fingers and  on hands  Psychiatric: She has a normal mood and affect.    ED Course  Procedures (including critical care time)   Labs Reviewed  POCT PREGNANCY, URINE  URINALYSIS, ROUTINE W REFLEX MICROSCOPIC   No results found.   No diagnosis found.    MDM  Rash looks like scabies,   Pt's labs are normal.   Glucose is 87.  I advised we do not do lupus testing in ED.  Ct scan reviewed.  I advised follow up with Dr. Concepcion Elk for recheck, referral for mamogram, lupus testing.   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

## 2011-12-18 NOTE — ED Notes (Signed)
Pt. Having intermittent headaches since May 5th, hx of migraines and fibromyalgia also was in her garden this week and now has a rash on her hands.

## 2011-12-18 NOTE — ED Notes (Signed)
Pt. Also having vaginal discharge

## 2012-01-20 ENCOUNTER — Inpatient Hospital Stay (HOSPITAL_COMMUNITY)
Admission: AD | Admit: 2012-01-20 | Discharge: 2012-01-20 | Disposition: A | Discharge status: Home / Self Care | Payer: Self-pay | Source: Ambulatory Visit | Attending: Obstetrics & Gynecology | Admitting: Obstetrics & Gynecology

## 2012-01-20 ENCOUNTER — Encounter (HOSPITAL_COMMUNITY): Payer: Self-pay | Admitting: *Deleted

## 2012-01-20 DIAGNOSIS — N760 Acute vaginitis: Secondary | ICD-10-CM | POA: Insufficient documentation

## 2012-01-20 DIAGNOSIS — B9689 Other specified bacterial agents as the cause of diseases classified elsewhere: Secondary | ICD-10-CM | POA: Insufficient documentation

## 2012-01-20 DIAGNOSIS — R109 Unspecified abdominal pain: Secondary | ICD-10-CM | POA: Insufficient documentation

## 2012-01-20 DIAGNOSIS — A499 Bacterial infection, unspecified: Secondary | ICD-10-CM

## 2012-01-20 HISTORY — DX: Trichomoniasis, unspecified: A59.9

## 2012-01-20 LAB — WET PREP, GENITAL
Trich, Wet Prep: NONE SEEN
Yeast Wet Prep HPF POC: NONE SEEN

## 2012-01-20 LAB — URINALYSIS, ROUTINE W REFLEX MICROSCOPIC
Bilirubin Urine: NEGATIVE
Glucose, UA: NEGATIVE mg/dL
Ketones, ur: NEGATIVE mg/dL
Leukocytes, UA: NEGATIVE
Nitrite: NEGATIVE
Protein, ur: NEGATIVE mg/dL
Specific Gravity, Urine: <1.005 — ABNORMAL LOW (ref 1.005–1.030)
Urobilinogen, UA: 0.2 mg/dL (ref 0.0–1.0)
pH: 6.5 (ref 5.0–8.0)

## 2012-01-20 LAB — POCT PREGNANCY, URINE: Preg Test, Ur: NEGATIVE

## 2012-01-20 LAB — URINE MICROSCOPIC-ADD ON

## 2012-01-20 MED ORDER — METRONIDAZOLE 500 MG PO TABS: 500.0000 mg | ORAL_TABLET | Freq: Two times a day (BID) | ORAL | Status: AC

## 2012-01-20 NOTE — MAU Note (Signed)
Pt shut rt index finger in door last Thurs.  Pt able to move finger and swelling has decrease but would like the nail looked at.  Nail is trying to come off.

## 2012-01-20 NOTE — MAU Provider Note (Signed)
History     CSN: [id number]  Arrival date & time 01/20/12  0948   None     Chief Complaint  Patient presents with  . Abdominal Pain  . Nausea  . Nail Problem   HPI Caitlin Valdez is a 41 y.o. female who presents to MAU for abdominal pain. The pain started this morning about 6 o'clock. Woke with the pain. She describes the pain as sharp that comes and goes. She rates the pain as 7/10 when it comes lasting for a few seconds.  Frequent urination but no pain. Nausea but no vomiting. Had sex with a friend 4 days ago and he removed the condom without the patient knowing. She is having some vaginal discharge since then and now the abdominal pain. No birth control other than condoms. Patient has been very upset since the episode and states she has been crying all weekend. LMP 01/06/12, last pap smear one year ago and was normal. Hx of trichomonas last year. The history was provided by the patient.  BP 132/96  Pulse 75  Temp 98 F (36.7 C) (Oral)  Resp 16  Ht 5\' 3"  (1.6 m)  Wt 150 lb (68.04 kg)  BMI 26.57 kg/m2  SpO2 100%  LMP 01/06/2012   Past Medical History  Diagnosis Date  . Abdominal pain, right lower quadrant   . Fibromyalgia   . Migraine     Past Surgical History  Procedure Date  . Cesarean section   . Vein surgery   . Fracture surgery     Family History  Problem Relation Age of Onset  . Other Neg Hx     History  Substance Use Topics  . Smoking status: Current Everyday Smoker -- 0.2 packs/day    Types: Cigarettes  . Smokeless tobacco: Never Used  . Alcohol Use: No    OB History    Grav Para Term Preterm Abortions TAB SAB Ect Mult Living   2 2 2       2       Review of Systems  Constitutional: Negative for fever, chills, diaphoresis and fatigue.  HENT: Negative for ear pain, congestion, sore throat, facial swelling, neck pain, neck stiffness, dental problem and sinus pressure.   Eyes: Negative for photophobia, pain, discharge and visual disturbance.    Respiratory: Negative for cough, chest tightness and wheezing.   Cardiovascular: Negative for chest pain and palpitations.  Gastrointestinal: Positive for nausea and abdominal pain. Negative for vomiting, diarrhea, constipation and abdominal distention.  Genitourinary: Positive for frequency, vaginal discharge and vaginal pain. Negative for dysuria, urgency, flank pain, vaginal bleeding and difficulty urinating.  Musculoskeletal: Negative for myalgias, back pain and gait problem.  Skin: Negative for color change and rash.  Neurological: Negative for dizziness, speech difficulty, weakness, light-headedness, numbness and headaches.  Psychiatric/Behavioral: Negative for confusion and agitation. Nervous/anxious: hx of anxiety.     Allergies  Review of patient's allergies indicates no known allergies.  Home Medications  No current outpatient prescriptions on file.  BP 132/96  Pulse 75  Temp 98 F (36.7 C) (Oral)  Resp 16  Ht 5\' 3"  (1.6 m)  Wt 150 lb (68.04 kg)  BMI 26.57 kg/m2  SpO2 100%  LMP 01/06/2012  Physical Exam  Nursing note and vitals reviewed. Constitutional: She is oriented to person, place, and time. She appears well-developed and well-nourished. No distress.  HENT:  Head: Normocephalic.  Eyes: EOM are normal.  Neck: Neck supple.  Cardiovascular: Normal rate and regular rhythm.  No murmur heard. Pulmonary/Chest: Effort normal and breath sounds normal. She has no wheezes. She has no rales.  Abdominal: Soft. Bowel sounds are normal. She exhibits no distension and no mass. There is no rebound, no guarding and no CVA tenderness.       Minimal tenderness LLQ.  Genitourinary:       External genitalia without lesions. Small amount of white discharge vaginal vault. No CMT, no adnexal mass or tenderness palpated. Uterus without palpable enlargement.  Musculoskeletal: Normal range of motion.  Neurological: She is alert and oriented to person, place, and time. No cranial nerve  deficit.  Skin: Skin is warm and dry.  Psychiatric: She has a normal mood and affect. Her behavior is normal. Judgment and thought content normal.   Results for orders placed during the hospital encounter of 01/20/12 (from the past 24 hour(s))  URINALYSIS, ROUTINE W REFLEX MICROSCOPIC     Status: Abnormal   Collection Time   01/20/12 10:08 AM      Component Value Range   Color, Urine YELLOW  YELLOW   APPearance CLEAR  CLEAR   Specific Gravity, Urine <1.005 (*) 1.005 - 1.030   pH 6.5  5.0 - 8.0   Glucose, UA NEGATIVE  NEGATIVE mg/dL   Hgb urine dipstick MODERATE (*) NEGATIVE   Bilirubin Urine NEGATIVE  NEGATIVE   Ketones, ur NEGATIVE  NEGATIVE mg/dL   Protein, ur NEGATIVE  NEGATIVE mg/dL   Urobilinogen, UA 0.2  0.0 - 1.0 mg/dL   Nitrite NEGATIVE  NEGATIVE   Leukocytes, UA NEGATIVE  NEGATIVE  URINE MICROSCOPIC-ADD ON     Status: Abnormal   Collection Time   01/20/12 10:08 AM      Component Value Range   Squamous Epithelial / LPF FEW (*) RARE   RBC / HPF 0-2  <3 RBC/hpf  POCT PREGNANCY, URINE     Status: Normal   Collection Time   01/20/12 10:24 AM      Component Value Range   Preg Test, Ur NEGATIVE  NEGATIVE  WET PREP, GENITAL     Status: Abnormal   Collection Time   01/20/12 10:40 AM      Component Value Range   Yeast Wet Prep HPF POC NONE SEEN  NONE SEEN   Trich, Wet Prep NONE SEEN  NONE SEEN   Clue Cells Wet Prep HPF POC MODERATE (*) NONE SEEN   WBC, Wet Prep HPF POC FEW (*) NONE SEEN   Assessment: Unprotected intercourse   Bacterial vaginosis  Plan:  Rx Flagyl   Follow up with PCP @ [facility]   Return here as needed. ED Course  Procedures    MDM

## 2012-01-20 NOTE — MAU Provider Note (Signed)
Attestation of Attending Supervision of Advanced Practitioner (CNM/NP): Evaluation and management procedures were performed by the Advanced Practitioner under my supervision and collaboration.  I have reviewed the Advanced Practitioner's note and chart, and I agree with the management and plan.  Jaynie Collins, M.D. 01/20/2012 1:26 PM

## 2012-01-20 NOTE — MAU Note (Signed)
Lower Abd pain started this am at 0600.  Pt describes as intermittent sharp pains.  Pt had unprotected intercourse without her knowledge until after ejaculation this past Thurs. (Started intercourse with condom and friend took it off without her knowledge).  Pt denies any vaginal bleeding but states she has had vaginal discharge since contact.

## 2012-01-21 LAB — GC/CHLAMYDIA PROBE AMP, GENITAL
Chlamydia, DNA Probe: NEGATIVE
GC Probe Amp, Genital: NEGATIVE

## 2012-04-29 ENCOUNTER — Emergency Department (HOSPITAL_COMMUNITY)
Admission: EM | Admit: 2012-04-29 | Discharge: 2012-04-29 | Disposition: A | Discharge status: Home / Self Care | Payer: Self-pay | Attending: Emergency Medicine | Admitting: Emergency Medicine

## 2012-04-29 ENCOUNTER — Encounter (HOSPITAL_COMMUNITY): Payer: Self-pay | Admitting: *Deleted

## 2012-04-29 DIAGNOSIS — R51 Headache: Secondary | ICD-10-CM | POA: Insufficient documentation

## 2012-04-29 DIAGNOSIS — IMO0001 Reserved for inherently not codable concepts without codable children: Secondary | ICD-10-CM | POA: Insufficient documentation

## 2012-04-29 NOTE — ED Notes (Signed)
Pt sts she is going to [facility]

## 2012-04-29 NOTE — ED Notes (Signed)
The pt has fibromyalgia and she has been hurting all over her body for some months and has been seen here in the past for the same.  Headache.

## 2012-07-23 ENCOUNTER — Encounter (HOSPITAL_COMMUNITY): Payer: Self-pay | Admitting: Emergency Medicine

## 2012-07-23 ENCOUNTER — Emergency Department (HOSPITAL_COMMUNITY)
Admission: EM | Admit: 2012-07-23 | Discharge: 2012-07-23 | Disposition: A | Discharge status: Home / Self Care | Payer: Self-pay | Attending: Emergency Medicine | Admitting: Emergency Medicine

## 2012-07-23 DIAGNOSIS — F17200 Nicotine dependence, unspecified, uncomplicated: Secondary | ICD-10-CM | POA: Insufficient documentation

## 2012-07-23 DIAGNOSIS — IMO0001 Reserved for inherently not codable concepts without codable children: Secondary | ICD-10-CM | POA: Insufficient documentation

## 2012-07-23 DIAGNOSIS — Z8619 Personal history of other infectious and parasitic diseases: Secondary | ICD-10-CM | POA: Insufficient documentation

## 2012-07-23 DIAGNOSIS — M797 Fibromyalgia: Secondary | ICD-10-CM

## 2012-07-23 DIAGNOSIS — Z8679 Personal history of other diseases of the circulatory system: Secondary | ICD-10-CM | POA: Insufficient documentation

## 2012-07-23 MED ORDER — OXYCODONE-ACETAMINOPHEN 5-325 MG PO TABS
2.0000 | ORAL_TABLET | Freq: Once | ORAL | Status: AC
  Administered 2012-07-23: 2 via ORAL
  Filled 2012-07-23: qty 2

## 2012-07-23 MED ORDER — OXYCODONE-ACETAMINOPHEN 5-325 MG PO TABS: ORAL_TABLET | ORAL | Status: DC

## 2012-07-23 NOTE — ED Provider Notes (Signed)
Medical screening examination/treatment/procedure(s) were performed by non-physician practitioner and as supervising physician I was immediately available for consultation/collaboration.   Richardean Canal, MD 07/23/12 [phone]

## 2012-07-23 NOTE — ED Provider Notes (Signed)
History     CSN: [id number]  Arrival date & time 07/23/12  4098   First MD Initiated Contact with Patient 07/23/12 [phone]      Chief Complaint  Patient presents with  . Fibromyalgia    (Consider location/radiation/quality/duration/timing/severity/associated sxs/prior treatment) HPI  Caitlin Valdez is a 42 y.o. female complaining of exacerbation of typical myalgia pain. Patient has been uninsured and unable to afford primary care and pain management. Pain is typical of her prior exacerbations occurring mainly on the left side it is associated with a aching numbness rated at 7/10 and not exacerbated by movement it is exacerbated by palpation. Patient denies any fever, cough, chest pain, abdominal pain, N/V, change in bowel or bladder habits.  Past Medical History  Diagnosis Date  . Abdominal pain, right lower quadrant   . Fibromyalgia   . Migraine   . Trichomonas 2012    Past Surgical History  Procedure Date  . Cesarean section   . Vein surgery   . Fracture surgery     Family History  Problem Relation Age of Onset  . Other Neg Hx     History  Substance Use Topics  . Smoking status: Current Every Day Smoker -- 0.2 packs/day    Types: Cigarettes  . Smokeless tobacco: Never Used  . Alcohol Use: No    OB History    Grav Para Term Preterm Abortions TAB SAB Ect Mult Living   2 2 2       2       Review of Systems  Constitutional: Negative for fever.  Respiratory: Negative for shortness of breath.   Cardiovascular: Negative for chest pain.  Gastrointestinal: Negative for nausea, vomiting, abdominal pain and diarrhea.  All other systems reviewed and are negative.    Allergies  Review of patient's allergies indicates no known allergies.  Home Medications   Current Outpatient Rx  Name  Route  Sig  Dispense  Refill  . OXYCODONE-ACETAMINOPHEN 5-325 MG PO TABS      1 to 2 tabs PO q6hrs  PRN for pain   15 tablet   0     BP 158/100  Pulse 82  Temp 98.1 F (36.7  C) (Oral)  Resp 18  SpO2 100%  Physical Exam  Nursing note and vitals reviewed. Constitutional: She is oriented to person, place, and time. She appears well-developed and well-nourished. No distress.  HENT:  Head: Normocephalic.  Eyes: Conjunctivae normal and EOM are normal.  Cardiovascular: Normal rate and intact distal pulses.   Pulmonary/Chest: Effort normal and breath sounds normal. No stridor. No respiratory distress. She has no wheezes. She has no rales. She exhibits no tenderness.  Abdominal: Soft. Bowel sounds are normal. She exhibits no distension and no mass. There is no tenderness. There is no rebound and no guarding.  Musculoskeletal: Normal range of motion.       Tender to palpation of left occiput, left shoulder, left hip.  Neurological: She is alert and oriented to person, place, and time.  Psychiatric: She has a normal mood and affect.    ED Course  Procedures (including critical care time)  Labs Reviewed - No data to display No results found.   1. Fibromyalgia       MDM  Patient with exacerbation of chronic fibromyalgia, do not suspect any new or acute etiologies.   Pt verbalized understanding and agrees with care plan. Outpatient follow-up and return precautions given.    New Prescriptions   OXYCODONE-ACETAMINOPHEN (PERCOCET/ROXICET) 5-325  MG PER TABLET    1 to 2 tabs PO q6hrs  PRN for pain          Wynetta Emery, PA-C 07/23/12 [phone]

## 2012-07-23 NOTE — ED Notes (Signed)
Onset last night general body achy numbness stats history of fibromyalgia Pain currently 8-10/10 achy dull and intermittent tearful due to pain.

## 2012-10-05 ENCOUNTER — Inpatient Hospital Stay (HOSPITAL_COMMUNITY): Payer: Self-pay

## 2012-10-05 ENCOUNTER — Inpatient Hospital Stay (HOSPITAL_COMMUNITY)
Admission: AD | Admit: 2012-10-05 | Discharge: 2012-10-05 | Disposition: A | Discharge status: Home / Self Care | Payer: Self-pay | Source: Ambulatory Visit | Attending: Obstetrics & Gynecology | Admitting: Obstetrics & Gynecology

## 2012-10-05 DIAGNOSIS — N926 Irregular menstruation, unspecified: Secondary | ICD-10-CM | POA: Insufficient documentation

## 2012-10-05 DIAGNOSIS — R109 Unspecified abdominal pain: Secondary | ICD-10-CM | POA: Insufficient documentation

## 2012-10-05 DIAGNOSIS — B9689 Other specified bacterial agents as the cause of diseases classified elsewhere: Secondary | ICD-10-CM

## 2012-10-05 DIAGNOSIS — G43909 Migraine, unspecified, not intractable, without status migrainosus: Secondary | ICD-10-CM

## 2012-10-05 DIAGNOSIS — N921 Excessive and frequent menstruation with irregular cycle: Secondary | ICD-10-CM

## 2012-10-05 DIAGNOSIS — R51 Headache: Secondary | ICD-10-CM | POA: Insufficient documentation

## 2012-10-05 DIAGNOSIS — IMO0001 Reserved for inherently not codable concepts without codable children: Secondary | ICD-10-CM | POA: Insufficient documentation

## 2012-10-05 DIAGNOSIS — N920 Excessive and frequent menstruation with regular cycle: Secondary | ICD-10-CM

## 2012-10-05 LAB — URINALYSIS, ROUTINE W REFLEX MICROSCOPIC
Bilirubin Urine: NEGATIVE
Glucose, UA: NEGATIVE mg/dL
Ketones, ur: NEGATIVE mg/dL
Leukocytes, UA: NEGATIVE
Nitrite: NEGATIVE
Protein, ur: NEGATIVE mg/dL
Specific Gravity, Urine: <1.005 — ABNORMAL LOW (ref 1.005–1.030)
Urobilinogen, UA: 0.2 mg/dL (ref 0.0–1.0)
pH: 5.5 (ref 5.0–8.0)

## 2012-10-05 LAB — WET PREP, GENITAL
Trich, Wet Prep: NONE SEEN
WBC, Wet Prep HPF POC: NONE SEEN
Yeast Wet Prep HPF POC: NONE SEEN

## 2012-10-05 LAB — POCT PREGNANCY, URINE: Preg Test, Ur: NEGATIVE

## 2012-10-05 LAB — URINE MICROSCOPIC-ADD ON

## 2012-10-05 MED ORDER — SODIUM CHLORIDE 0.9 % IV BOLUS (SEPSIS)
1000.0000 mL | Freq: Once | INTRAVENOUS | Status: AC
  Administered 2012-10-05: 1000 mL via INTRAVENOUS

## 2012-10-05 MED ORDER — METOCLOPRAMIDE HCL 5 MG/ML IJ SOLN
10.0000 mg | INTRAMUSCULAR | Status: AC
  Administered 2012-10-05: 10 mg via INTRAVENOUS
  Filled 2012-10-05: qty 2

## 2012-10-05 MED ORDER — DEXAMETHASONE SODIUM PHOSPHATE 10 MG/ML IJ SOLN
10.0000 mg | INTRAMUSCULAR | Status: AC
  Administered 2012-10-05: 10 mg via INTRAVENOUS
  Filled 2012-10-05: qty 1

## 2012-10-05 MED ORDER — METRONIDAZOLE 500 MG PO TABS: 500.0000 mg | ORAL_TABLET | Freq: Two times a day (BID) | ORAL | Status: DC

## 2012-10-05 MED ORDER — DIPHENHYDRAMINE HCL 50 MG/ML IJ SOLN
25.0000 mg | INTRAMUSCULAR | Status: AC
  Administered 2012-10-05: 25 mg via INTRAVENOUS
  Filled 2012-10-05: qty 1

## 2012-10-05 NOTE — MAU Note (Signed)
Patient states she has had irregular period since January. States they will last for seven days, stop then start again, at times very heavy. Having slight right lower abdominal pain. Patient states she has fibromyalgia and has been having headaches.

## 2012-10-05 NOTE — MAU Provider Note (Signed)
History     CSN: [id number]  Arrival date and time: 10/05/12 1148   First Provider Initiated Contact with Patient 10/05/12 1359      Chief Complaint  Patient presents with  . Headache  . Abdominal Pain   HPI Ms. Ek is a 42 y/o female G2P2002 w/ PMHx of migraines and fibromyalgia who presents to MAU w/ complaint of headache and abnormal periods. She has been experiencing a throbbing, left frontal headache that began gradually 2 nights ago and has persisted since. The pain is currently 7/10. Yesterday she was concerned because the pain was radiating down the left side of her body w/ associated numbness and tingling. She denies any currently altered sensation. She has tried Aleve w/o relief. No improvement w/ sleep. She has been attempting to remain adequately hydrated. She is currently not on any regular migraine therapy. She notes associated photophobia, phonophobia, blurry vision, nausea and vomiting yesterday. She has history of migraines, reports 8 over the last several months. This episode concerned her due to the altered sensation that occurred yesterday.  She also reports that she has had abnormal menstrual cycle since January. Previously she would have a regular, 3 day period with heavy bleeding and cramping. Since January she has had heavy vaginal bleeding and cramping that will last for 7 days, subside, and then resume within a few days. She notes associated right lower quadrant abdominal pain. No history of similar episodes of abnormal bleeding.   OB History   Grav Para Term Preterm Abortions TAB SAB Ect Mult Living   2 2 2       2       Past Medical History  Diagnosis Date  . Abdominal pain, right lower quadrant   . Fibromyalgia   . Migraine   . Trichomonas 2012    Past Surgical History  Procedure Laterality Date  . Cesarean section    . Vein surgery    . Fracture surgery      Family History  Problem Relation Age of Onset  . Other Neg Hx     History   Substance Use Topics  . Smoking status: Current Every Day Smoker -- 0.25 packs/day    Types: Cigarettes  . Smokeless tobacco: Never Used  . Alcohol Use: No    Allergies: No Known Allergies  Prescriptions prior to admission  Medication Sig Dispense Refill  . naproxen sodium (ANAPROX) 220 MG tablet Take 660 mg by mouth 3 (three) times daily as needed (headaches).        Review of Systems  Constitutional: Negative for fever, chills, weight loss and diaphoresis.  HENT: Negative for hearing loss and tinnitus.   Eyes: Positive for blurred vision and photophobia. Negative for discharge and redness.  Respiratory: Negative for cough, hemoptysis, sputum production, shortness of breath and wheezing.   Cardiovascular: Negative for chest pain, palpitations, orthopnea, leg swelling and PND.  Gastrointestinal: Positive for nausea and vomiting. Negative for heartburn, diarrhea, constipation, blood in stool and melena.  Genitourinary: Negative for dysuria, urgency, frequency, hematuria and flank pain.  Skin: Negative for itching and rash.  Neurological: Positive for tingling (left side) and headaches. Negative for dizziness, speech change, focal weakness, seizures and loss of consciousness.  Endo/Heme/Allergies: Negative for polydipsia. Does not bruise/bleed easily.   Physical Exam   Blood pressure 138/102, pulse 72, temperature 98.1 F (36.7 C), temperature source Oral, resp. rate 18, height 5\' 4"  (1.626 m), weight 85.458 kg (188 lb 6.4 oz), last menstrual period 09/25/2012, SpO2 100.00%.  Physical Exam  Nursing note and vitals reviewed. Constitutional: She is oriented to person, place, and time. She appears well-developed and well-nourished. No distress.  HENT:  Head: Normocephalic and atraumatic.  Mouth/Throat: No oropharyngeal exudate.  Eyes: Conjunctivae and EOM are normal. Pupils are equal, round, and reactive to light. Right eye exhibits no discharge. Left eye exhibits no discharge.   Neck: No JVD present.  Cardiovascular: Normal rate, regular rhythm, normal heart sounds and intact distal pulses.   Respiratory: Effort normal and breath sounds normal. No respiratory distress. She has no wheezes. She has no rales. She exhibits no tenderness.  GI: Soft. Bowel sounds are normal. She exhibits no distension and no mass. There is tenderness in the right lower quadrant and epigastric area. There is no rigidity, no rebound, no guarding, no CVA tenderness, no tenderness at McBurney's point and negative Murphy's sign.  Genitourinary: There is no tenderness on the right labia. There is no tenderness on the left labia. Uterus is tender. Cervix exhibits no motion tenderness, no discharge and no friability. Right adnexum displays tenderness. Right adnexum displays no mass. Left adnexum displays no mass and no tenderness. No erythema, tenderness or bleeding around the vagina. No vaginal discharge found.  Neurological: She is alert and oriented to person, place, and time. She has normal strength. No cranial nerve deficit or sensory deficit. She displays a negative Romberg sign. Coordination and gait normal. GCS eye subscore is 4. GCS verbal subscore is 5. GCS motor subscore is 6.  Skin: Skin is warm and dry.  Psychiatric: She has a normal mood and affect. Her behavior is normal. Judgment and thought content normal.    Results for orders placed during the hospital encounter of 10/05/12 (from the past 24 hour(s))  URINALYSIS, ROUTINE W REFLEX MICROSCOPIC     Status: Abnormal   Collection Time    10/05/12 12:10 PM      Result Value Range   Color, Urine YELLOW  YELLOW   APPearance CLEAR  CLEAR   Specific Gravity, Urine <1.005 (*) 1.005 - 1.030   pH 5.5  5.0 - 8.0   Glucose, UA NEGATIVE  NEGATIVE mg/dL   Hgb urine dipstick MODERATE (*) NEGATIVE   Bilirubin Urine NEGATIVE  NEGATIVE   Ketones, ur NEGATIVE  NEGATIVE mg/dL   Protein, ur NEGATIVE  NEGATIVE mg/dL   Urobilinogen, UA 0.2  0.0 - 1.0  mg/dL   Nitrite NEGATIVE  NEGATIVE   Leukocytes, UA NEGATIVE  NEGATIVE  URINE MICROSCOPIC-ADD ON     Status: Abnormal   Collection Time    10/05/12 12:10 PM      Result Value Range   Squamous Epithelial / LPF FEW (*) RARE   WBC, UA 0-2  <3 WBC/hpf   RBC / HPF 3-6  <3 RBC/hpf  POCT PREGNANCY, URINE     Status: None   Collection Time    10/05/12 12:24 PM      Result Value Range   Preg Test, Ur NEGATIVE  NEGATIVE  WET PREP, GENITAL     Status: Abnormal   Collection Time    10/05/12  3:06 PM      Result Value Range   Yeast Wet Prep HPF POC NONE SEEN  NONE SEEN   Trich, Wet Prep NONE SEEN  NONE SEEN   Clue Cells Wet Prep HPF POC MODERATE (*) NONE SEEN   WBC, Wet Prep HPF POC NONE SEEN  NONE SEEN   Ultrasound *RADIOLOGY REPORT*  Clinical Data: Irregular menses, pain  TRANSABDOMINAL AND TRANSVAGINAL ULTRASOUND OF PELVIS  Technique: Both transabdominal and transvaginal ultrasound  examinations of the pelvis were performed. Transabdominal  technique was performed for global imaging of the pelvis including  uterus, ovaries, adnexal regions, and pelvic cul-de-sac.  It was necessary to proceed with endovaginal exam following the  transabdominal exam to visualize the endometrium and ovaries.  Comparison: 03/24/2011  Findings:  Uterus: Anteverted, anteflexed. 8.0 x 3.5 x 3.5 cm. No focal  abnormality.  Endometrium: 9 mm. Trilaminar in appearance without focal  abnormality.  Right ovary: 3.0 x 2.0 x 1.5 cm. Normal.  Left ovary: 2.9 x 2.6 x 1.6 cm. Normal.  Other Findings: Trace free fluid identified.  IMPRESSION:  Normal study. No evidence of pelvic mass or other significant  abnormality.  Original Report Authenticated By: Christiana Pellant, M.D.   MAU Course  Procedures  Assessment and Plan   1. Menometrorrhagia   2. Headache, migraine   3. BV (bacterial vaginosis)     Meds ordered this encounter  Medications  . naproxen sodium (ANAPROX) 220 MG tablet    Sig: Take 660 mg  by mouth 3 (three) times daily as needed (headaches).  . sodium chloride 0.9 % bolus 1,000 mL    Sig:   . diphenhydrAMINE (BENADRYL) injection 25 mg    Sig:   . dexamethasone (DECADRON) injection 10 mg    Sig:   . metoCLOPramide (REGLAN) injection 10 mg    Sig:   . metroNIDAZOLE (FLAGYL) 500 MG tablet    Sig: Take 1 tablet (500 mg total) by mouth 2 (two) times daily.    Dispense:  14 tablet    Refill:  0    Order Specific Question:  Supervising Provider    Answer:  Elsie Lincoln H [[id number]]   Follow-up Information   Follow up with [facility]. (The clinic will call you or call the number below.  )    Contact information:   [address] [place] [address] [phone]      Schedule an appointment as soon as possible for a visit with [facility]. (Please make a primary care appointment as needed)    Contact information:   [address] [place] [address] [phone]      Dorrene German 10/05/2012, 4:46 PM   I have seen this patient and agree with the above PA student's note.  Pt had full relief of h/a with MAU management.  Message sent to gyn clinic for f/u on irregular bleeding.  Pt not bleeding today in MAU so no treatment given.  Pt to f/u with primary care as she has recently moved here and will need management for migraines and fibromyalgia.   LEFTWICH-KIRBY, Jerett Odonohue Certified Nurse-Midwife

## 2012-10-06 ENCOUNTER — Other Ambulatory Visit: Payer: Self-pay | Admitting: Advanced Practice Midwife

## 2012-10-06 ENCOUNTER — Telehealth (HOSPITAL_COMMUNITY): Payer: Self-pay | Admitting: *Deleted

## 2012-10-06 LAB — GC/CHLAMYDIA PROBE AMP
CT Probe RNA: NEGATIVE
GC Probe RNA: NEGATIVE

## 2012-10-06 MED ORDER — NAPROXEN SODIUM 220 MG PO TABS: 220.0000 mg | ORAL_TABLET | Freq: Two times a day (BID) | ORAL | Status: DC

## 2012-10-06 MED ORDER — BUTALBITAL-APAP-CAFFEINE 50-325-40 MG PO TABS: 1.0000 | ORAL_TABLET | Freq: Four times a day (QID) | ORAL | Status: DC | PRN

## 2012-10-06 NOTE — Progress Notes (Signed)
Pt contacted MAU to report migraine h/a continues, desires medication until she can get in to primary care.  Fioricet 20 tabs printed, pt to come by MAU and pick up.

## 2012-10-08 NOTE — MAU Provider Note (Signed)
Attestation of Attending Supervision of Advanced Practitioner (CNM/NP): Evaluation and management procedures were performed by the Advanced Practitioner under my supervision and collaboration. I have reviewed the Advanced Practitioner's note and chart, and I agree with the management and plan.  Christhoper Busbee H. 9:55 AM

## 2013-01-08 ENCOUNTER — Encounter (HOSPITAL_COMMUNITY): Payer: Self-pay | Admitting: *Deleted

## 2013-01-08 ENCOUNTER — Emergency Department (HOSPITAL_COMMUNITY)
Admission: EM | Admit: 2013-01-08 | Discharge: 2013-01-08 | Disposition: A | Discharge status: Home / Self Care | Payer: Self-pay | Attending: Emergency Medicine | Admitting: Emergency Medicine

## 2013-01-08 DIAGNOSIS — Z8619 Personal history of other infectious and parasitic diseases: Secondary | ICD-10-CM | POA: Insufficient documentation

## 2013-01-08 DIAGNOSIS — M545 Low back pain, unspecified: Secondary | ICD-10-CM | POA: Insufficient documentation

## 2013-01-08 DIAGNOSIS — IMO0001 Reserved for inherently not codable concepts without codable children: Secondary | ICD-10-CM | POA: Insufficient documentation

## 2013-01-08 DIAGNOSIS — M79605 Pain in left leg: Secondary | ICD-10-CM

## 2013-01-08 DIAGNOSIS — R5381 Other malaise: Secondary | ICD-10-CM | POA: Insufficient documentation

## 2013-01-08 DIAGNOSIS — G43909 Migraine, unspecified, not intractable, without status migrainosus: Secondary | ICD-10-CM | POA: Insufficient documentation

## 2013-01-08 DIAGNOSIS — IMO0002 Reserved for concepts with insufficient information to code with codable children: Secondary | ICD-10-CM | POA: Insufficient documentation

## 2013-01-08 DIAGNOSIS — M797 Fibromyalgia: Secondary | ICD-10-CM

## 2013-01-08 DIAGNOSIS — R209 Unspecified disturbances of skin sensation: Secondary | ICD-10-CM | POA: Insufficient documentation

## 2013-01-08 DIAGNOSIS — M79604 Pain in right leg: Secondary | ICD-10-CM

## 2013-01-08 DIAGNOSIS — F17200 Nicotine dependence, unspecified, uncomplicated: Secondary | ICD-10-CM | POA: Insufficient documentation

## 2013-01-08 DIAGNOSIS — H53149 Visual discomfort, unspecified: Secondary | ICD-10-CM | POA: Insufficient documentation

## 2013-01-08 MED ORDER — OXYCODONE-ACETAMINOPHEN 5-325 MG PO TABS: ORAL_TABLET | ORAL | Status: DC

## 2013-01-08 MED ORDER — PROCHLORPERAZINE EDISYLATE 5 MG/ML IJ SOLN
10.0000 mg | Freq: Once | INTRAMUSCULAR | Status: AC
  Administered 2013-01-08: 10 mg via INTRAMUSCULAR
  Filled 2013-01-08: qty 2

## 2013-01-08 MED ORDER — SODIUM CHLORIDE 0.9 % IV BOLUS (SEPSIS)
1000.0000 mL | Freq: Once | INTRAVENOUS | Status: AC
  Administered 2013-01-08: 1000 mL via INTRAVENOUS

## 2013-01-08 MED ORDER — HYDROMORPHONE HCL PF 1 MG/ML IJ SOLN
1.0000 mg | Freq: Once | INTRAMUSCULAR | Status: AC
  Administered 2013-01-08: 1 mg via INTRAVENOUS
  Filled 2013-01-08: qty 1

## 2013-01-08 MED ORDER — DIPHENHYDRAMINE HCL 50 MG/ML IJ SOLN
25.0000 mg | Freq: Once | INTRAMUSCULAR | Status: AC
  Administered 2013-01-08: 25 mg via INTRAVENOUS
  Filled 2013-01-08: qty 1

## 2013-01-08 NOTE — ED Provider Notes (Signed)
History    CSN: [id number] Arrival date & time 01/08/13  1153  First MD Initiated Contact with Patient 01/08/13 1242     Chief Complaint  Patient presents with  . Fibromyalgia   (Consider location/radiation/quality/duration/timing/severity/associated sxs/prior Treatment) HPI Pt is a 42yo female with hx of migraine and fibromyalgia c/o LBP and migraine that started when she woke up this morning.  Pt states LBP is intermittent sharp pains, 8/10, causing it difficult to move. Pt had a hard time getting out of bed due to pain.  Pt also c/o gradual onset of migraine that is typical per pt, 7/10, sharp throbbing pain in front of head, made worse with light.  Pt states she has tried Advil w/o relief.  She is out of her percocet which normally controls her pain but she does not have PCP to get refills due to lack of insurance.  Denies change in vision or balance. Denies fever, n/v/d.   Past Medical History  Diagnosis Date  . Abdominal pain, right lower quadrant   . Fibromyalgia   . Migraine   . Trichomonas 2012   Past Surgical History  Procedure Laterality Date  . Cesarean section    . Vein surgery    . Fracture surgery     Family History  Problem Relation Age of Onset  . Other Neg Hx    History  Substance Use Topics  . Smoking status: Current Every Day Smoker -- 0.25 packs/day    Types: Cigarettes  . Smokeless tobacco: Never Used  . Alcohol Use: No   OB History   Grav Para Term Preterm Abortions TAB SAB Ect Mult Living   2 2 2       2      Review of Systems  Constitutional: Positive for fatigue. Negative for fever, chills and diaphoresis.  Eyes: Positive for photophobia. Negative for pain, redness and visual disturbance.  Musculoskeletal: Positive for myalgias, back pain and gait problem ( difficult, due to pain). Negative for joint swelling and arthralgias.  Neurological: Positive for numbness and headaches. Negative for dizziness, tremors, seizures, syncope, speech  difficulty, weakness and light-headedness.  All other systems reviewed and are negative.    Allergies  Review of patient's allergies indicates no known allergies.  Home Medications   Current Outpatient Rx  Name  Route  Sig  Dispense  Refill  . oxyCODONE-acetaminophen (PERCOCET/ROXICET) 5-325 MG per tablet      Take 1-2 pills every 6 hours as needed for pain.   6 tablet   0    BP 101/53  Pulse 68  Temp(Src) 98.5 F (36.9 C) (Oral)  Resp 18  SpO2 100%  LMP 12/25/2012 Physical Exam  Nursing note and vitals reviewed. Constitutional: She is oriented to person, place, and time. She appears well-developed and well-nourished. No distress.  HENT:  Head: Normocephalic and atraumatic.  Mouth/Throat: Oropharynx is clear and moist. No oropharyngeal exudate.  Eyes: Conjunctivae and EOM are normal. Pupils are equal, round, and reactive to light. Right eye exhibits no discharge. Left eye exhibits no discharge. No scleral icterus.  Neck: Normal range of motion. Neck supple.  No nuchal rigidity or meningeal signs.    Cardiovascular: Normal rate, regular rhythm and normal heart sounds.   Pulmonary/Chest: Effort normal and breath sounds normal. No respiratory distress. She has no wheezes. She has no rales. She exhibits no tenderness.  Musculoskeletal: Normal range of motion. She exhibits tenderness ( across muscles of lower back).       Arms:  No bony tenderness, step offs or crepitus  Neurological: She is alert and oriented to person, place, and time. No cranial nerve deficit. Coordination normal.  CN II-XII in tact, no focal deficit, nl finger to nose coordination. Nl sensation, 5/5 strength in all major muscle groups. Neg romberg and nl gait.    Skin: Skin is warm and dry. She is not diaphoretic.  Psychiatric: She has a normal mood and affect. Her behavior is normal.    ED Course  Procedures (including critical care time) Labs Reviewed - No data to display No results found. 1.  Fibromyalgia   2. Migraine   3. LBP radiating to both legs     MDM  Pt presenting with flare of fibromyalgia with LBP and Migraine. HA is typical per pt.  Neuro exam: unremarkable.  LBP tender along lumbar muscles. Pt has antalgic gait.  Will tx pain and discharge pt home with f/[facility] with [facility].    Pt states feeling better after pain tx.  Rx: percocet (6) Pt is to f/u with [facility] info provided for ongoing medical care and management of fibromyalgia. Return precautions given. Pt verbalized understanding and agreement with tx plan. Vitals: unremarkable. Discharged in stable condition.       Junius Finner, PA-C 01/08/13 1405

## 2013-01-08 NOTE — ED Notes (Signed)
[id number]<IO> Expected date:<BR> Expected time:<BR> Means of arrival:<BR> Comments:<BR> Chronic pain

## 2013-01-08 NOTE — ED Notes (Signed)
Per EMS pt having pain from fibromyalgia, been out of percocet's, having pain in back and legs, pain started yesterday. BP 124/78, HR 80.

## 2013-01-08 NOTE — ED Notes (Signed)
Pt states woke up this morning and was unable to get out of bed d/t pain in lower back and legs from fibromyalgia, pt also states she has a migraine, has a hx of both, states she is out of her medications, states she usually comes here and gets a prescription, does not have a PCP or insurance to see a PCP.

## 2013-01-09 NOTE — ED Provider Notes (Signed)
Medical screening examination/treatment/procedure(s) were performed by non-physician practitioner and as supervising physician I was immediately available for consultation/collaboration.  Raeford Razor, MD 01/09/13 [id number]

## 2013-03-27 ENCOUNTER — Emergency Department (HOSPITAL_COMMUNITY)
Admission: EM | Admit: 2013-03-27 | Discharge: 2013-03-27 | Disposition: A | Discharge status: Home / Self Care | Payer: Self-pay | Attending: Emergency Medicine | Admitting: Emergency Medicine

## 2013-03-27 ENCOUNTER — Encounter (HOSPITAL_COMMUNITY): Payer: Self-pay | Admitting: Emergency Medicine

## 2013-03-27 DIAGNOSIS — Z8619 Personal history of other infectious and parasitic diseases: Secondary | ICD-10-CM | POA: Insufficient documentation

## 2013-03-27 DIAGNOSIS — M549 Dorsalgia, unspecified: Secondary | ICD-10-CM | POA: Insufficient documentation

## 2013-03-27 DIAGNOSIS — Z8679 Personal history of other diseases of the circulatory system: Secondary | ICD-10-CM | POA: Insufficient documentation

## 2013-03-27 DIAGNOSIS — IMO0001 Reserved for inherently not codable concepts without codable children: Secondary | ICD-10-CM | POA: Insufficient documentation

## 2013-03-27 DIAGNOSIS — M791 Myalgia, unspecified site: Secondary | ICD-10-CM

## 2013-03-27 DIAGNOSIS — J329 Chronic sinusitis, unspecified: Secondary | ICD-10-CM | POA: Insufficient documentation

## 2013-03-27 DIAGNOSIS — F17200 Nicotine dependence, unspecified, uncomplicated: Secondary | ICD-10-CM | POA: Insufficient documentation

## 2013-03-27 MED ORDER — IBUPROFEN 800 MG PO TABS: 800.0000 mg | ORAL_TABLET | Freq: Three times a day (TID) | ORAL | Status: DC

## 2013-03-27 MED ORDER — HYDROCODONE-ACETAMINOPHEN 7.5-325 MG/15ML PO SOLN: 10.0000 mL | Freq: Two times a day (BID) | ORAL | Status: DC

## 2013-03-27 MED ORDER — SULFAMETHOXAZOLE-TRIMETHOPRIM 400-80 MG PO TABS: 1.0000 | ORAL_TABLET | Freq: Two times a day (BID) | ORAL | Status: DC

## 2013-03-27 MED ORDER — IBUPROFEN 800 MG PO TABS
800.0000 mg | ORAL_TABLET | Freq: Once | ORAL | Status: AC
  Administered 2013-03-27: 800 mg via ORAL
  Filled 2013-03-27: qty 1

## 2013-03-27 MED ORDER — SULFAMETHOXAZOLE-TMP DS 800-160 MG PO TABS
1.0000 | ORAL_TABLET | Freq: Once | ORAL | Status: AC
  Administered 2013-03-27: 1 via ORAL
  Filled 2013-03-27: qty 1

## 2013-03-27 NOTE — ED Provider Notes (Signed)
Medical screening examination/treatment/procedure(s) were performed by non-physician practitioner and as supervising physician I was immediately available for consultation/collaboration.  Shon Baton, MD 03/27/13 2024

## 2013-03-27 NOTE — ED Provider Notes (Signed)
CSN: [id number]     Arrival date & time 03/27/13  1421 History   First MD Initiated Contact with Patient 03/27/13 1456     Chief Complaint  Patient presents with  . Facial Pain  . Back Pain   (Consider location/radiation/quality/duration/timing/severity/associated sxs/prior Treatment) Patient is a 42 y.o. female presenting with URI. The history is provided by the patient. No language interpreter was used.  URI Presenting symptoms: congestion, cough, facial pain and rhinorrhea   Associated symptoms: myalgias   Associated symptoms comment:  URI symptoms as above including productive cough, chills and muscle aches to upper back, shoulders and arms that feel like exacerbation of her fibromyalgia.   Past Medical History  Diagnosis Date  . Abdominal pain, right lower quadrant   . Fibromyalgia   . Migraine   . Trichomonas 2012   Past Surgical History  Procedure Laterality Date  . Cesarean section    . Vein surgery    . Fracture surgery     Family History  Problem Relation Age of Onset  . Other Neg Hx   . Diabetes Mother   . Hypertension Mother   . Diabetes Father   . Hypertension Father   . Diabetes Other   . Hypertension Other    History  Substance Use Topics  . Smoking status: Current Every Day Smoker -- 0.25 packs/day    Types: Cigarettes  . Smokeless tobacco: Never Used  . Alcohol Use: No   OB History   Grav Para Term Preterm Abortions TAB SAB Ect Mult Living   2 2 2       2      Review of Systems  HENT: Positive for congestion, rhinorrhea and sinus pressure.   Respiratory: Positive for cough. Negative for shortness of breath.   Cardiovascular: Negative for chest pain.  Gastrointestinal: Negative for nausea and abdominal pain.  Musculoskeletal: Positive for myalgias and back pain.    Allergies  Review of patient's allergies indicates no known allergies.  Home Medications   Current Outpatient Rx  Name  Route  Sig  Dispense  Refill  . Aspirin-Acetaminophen  (GOODYS BODY PAIN PO)   Oral   Take 1 packet by mouth 3 (three) times daily as needed.          BP 124/77  Pulse 66  Temp(Src) 98.3 F (36.8 C) (Oral)  Resp 16  SpO2 100%  LMP 03/21/2013 Physical Exam  Constitutional: She is oriented to person, place, and time. She appears well-developed and well-nourished.  HENT:  Head: Normocephalic.  Right Ear: External ear normal.  Left Ear: External ear normal.  Nose: Mucosal edema present. Right sinus exhibits frontal sinus tenderness. Left sinus exhibits frontal sinus tenderness.  Mouth/Throat: Oropharynx is clear and moist.  Neck: Normal range of motion. Neck supple.  Cardiovascular: Normal rate and normal heart sounds.   No murmur heard. Pulmonary/Chest: Effort normal and breath sounds normal. She has no wheezes. She has no rales.  Abdominal: Soft. Bowel sounds are normal. She exhibits no distension. There is no tenderness.  Musculoskeletal: Normal range of motion.  Lymphadenopathy:    She has no cervical adenopathy.  Neurological: She is alert and oriented to person, place, and time.  Skin: Skin is warm and dry. No pallor.    ED Course  Procedures (including critical care time) Labs Review Labs Reviewed - No data to display Imaging Review No results found.  MDM  No diagnosis found. 1. Sinusitis 2. Myalgia  Will treat with abx, supportive care  and PCP follow up prn.    Arnoldo Hooker, PA-C 03/27/13 1537

## 2013-03-27 NOTE — ED Notes (Signed)
Pain and pressure in nasal sinus x 2 days

## 2013-04-02 ENCOUNTER — Encounter (HOSPITAL_COMMUNITY): Payer: Self-pay | Admitting: *Deleted

## 2013-04-02 ENCOUNTER — Inpatient Hospital Stay (HOSPITAL_COMMUNITY)
Admission: AD | Admit: 2013-04-02 | Discharge: 2013-04-02 | Disposition: A | Discharge status: Home / Self Care | Payer: Self-pay | Source: Ambulatory Visit | Attending: Obstetrics & Gynecology | Admitting: Obstetrics & Gynecology

## 2013-04-02 DIAGNOSIS — N898 Other specified noninflammatory disorders of vagina: Secondary | ICD-10-CM | POA: Insufficient documentation

## 2013-04-02 DIAGNOSIS — N949 Unspecified condition associated with female genital organs and menstrual cycle: Secondary | ICD-10-CM | POA: Insufficient documentation

## 2013-04-02 LAB — URINALYSIS, ROUTINE W REFLEX MICROSCOPIC
Bilirubin Urine: NEGATIVE
Glucose, UA: NEGATIVE mg/dL
Ketones, ur: NEGATIVE mg/dL
Leukocytes, UA: NEGATIVE
Nitrite: NEGATIVE
Protein, ur: NEGATIVE mg/dL
Specific Gravity, Urine: <1.005 — ABNORMAL LOW (ref 1.005–1.030)
Urobilinogen, UA: 0.2 mg/dL (ref 0.0–1.0)
pH: 6 (ref 5.0–8.0)

## 2013-04-02 LAB — POCT PREGNANCY, URINE: Preg Test, Ur: NEGATIVE

## 2013-04-02 LAB — WET PREP, GENITAL
Clue Cells Wet Prep HPF POC: NONE SEEN
Trich, Wet Prep: NONE SEEN
Yeast Wet Prep HPF POC: NONE SEEN

## 2013-04-02 LAB — URINE MICROSCOPIC-ADD ON

## 2013-04-02 MED ORDER — LIDOCAINE 5 % EX OINT: TOPICAL_OINTMENT | CUTANEOUS | Status: DC | PRN

## 2013-04-02 NOTE — MAU Note (Signed)
Pt states she shaved her perineum last week, then had her period & thinks the pads have irritated her.  Is very sore & irritated, ? Cut.

## 2013-04-02 NOTE — MAU Provider Note (Signed)
History     CSN: [id number]  Arrival date and time: 04/02/13 4098   First Provider Initiated Contact with Patient 04/02/13 1040      Chief Complaint  Patient presents with  . Vaginal Pain   HPI Caitlin Valdez is a 42yo G2P2 who presents to the MAU with vaginal pain after shaving 1 week ago. The lesion is located between her labia anteriorly. The pain worsened yesterday with irritation from the pad she was wearing for her period and is also worse when wiping after urination. The pain is burning in nature, and feels similar to a cut. She has not tried anything to relieve the symptoms. She also has some tenderness of her perineum and reports vaginal d/c. She is sexually active and uses condoms. Denies feeling dehydrated, feverish or flu-like. Denies dysuria, and swelling of the lesion.    OB History   Grav Para Term Preterm Abortions TAB SAB Ect Mult Living   2 2 2       2       Past Medical History  Diagnosis Date  . Abdominal pain, right lower quadrant   . Fibromyalgia   . Migraine   . Trichomonas 2012    Past Surgical History  Procedure Laterality Date  . Cesarean section    . Vein surgery    . Fracture surgery      Family History  Problem Relation Age of Onset  . Other Neg Hx   . Diabetes Mother   . Hypertension Mother   . Diabetes Father   . Hypertension Father   . Diabetes Other   . Hypertension Other     History  Substance Use Topics  . Smoking status: Current Every Day Smoker -- 0.25 packs/day    Types: Cigarettes  . Smokeless tobacco: Never Used  . Alcohol Use: No    Allergies: No Known Allergies  Prescriptions prior to admission  Medication Sig Dispense Refill  . Aspirin-Acetaminophen (GOODYS BODY PAIN PO) Take 1 packet by mouth 3 (three) times daily as needed (pain).       [name] OVER THE COUNTER MEDICATION Take 1 tablet by mouth 2 (two) times daily. Otc, allergy relief medication      . HYDROcodone-acetaminophen (HYCET) 7.5-325 mg/15 ml solution Take  10 mLs by mouth 2 (two) times daily.  90 mL  0  . ibuprofen (ADVIL,MOTRIN) 800 MG tablet Take 1 tablet (800 mg total) by mouth 3 (three) times daily.  21 tablet  0  . sulfamethoxazole-trimethoprim (BACTRIM) 400-80 MG per tablet Take 1 tablet by mouth 2 (two) times daily.  14 tablet  0    Review of Systems  Constitutional: Negative for fever, malaise/fatigue and diaphoresis.  Cardiovascular: Negative for chest pain and palpitations.  Gastrointestinal: Negative for abdominal pain.  Genitourinary: Negative for dysuria.  Neurological: Negative for dizziness.   Physical Exam   Blood pressure 108/74, pulse 89, temperature 98.5 F (36.9 C), temperature source Oral, resp. rate 18, height 5\' 3"  (1.6 m), weight 181 lb (82.101 kg), last menstrual period 03/21/2013.  Physical Exam  Constitutional: She appears well-developed and well-nourished. No distress.  Genitourinary:    There is no rash, tenderness, lesion or injury on the right labia. There is rash, tenderness and lesion on the left labia. No vaginal discharge found.  Skin: She is not diaphoretic.    MAU Course  Procedures Results for orders placed during the hospital encounter of 04/02/13 (from the past 24 hour(s))  URINALYSIS, ROUTINE W REFLEX MICROSCOPIC  Status: Abnormal   Collection Time    04/02/13 10:03 AM      Result Value Range   Color, Urine YELLOW  YELLOW   APPearance CLEAR  CLEAR   Specific Gravity, Urine <1.005 (*) 1.005 - 1.030   pH 6.0  5.0 - 8.0   Glucose, UA NEGATIVE  NEGATIVE mg/dL   Hgb urine dipstick SMALL (*) NEGATIVE   Bilirubin Urine NEGATIVE  NEGATIVE   Ketones, ur NEGATIVE  NEGATIVE mg/dL   Protein, ur NEGATIVE  NEGATIVE mg/dL   Urobilinogen, UA 0.2  0.0 - 1.0 mg/dL   Nitrite NEGATIVE  NEGATIVE   Leukocytes, UA NEGATIVE  NEGATIVE  URINE MICROSCOPIC-ADD ON     Status: Abnormal   Collection Time    04/02/13 10:03 AM      Result Value Range   Squamous Epithelial / LPF FEW (*) RARE   WBC, UA 0-2  <3  WBC/hpf   RBC / HPF 3-6  <3 RBC/hpf  POCT PREGNANCY, URINE     Status: None   Collection Time    04/02/13 10:13 AM      Result Value Range   Preg Test, Ur NEGATIVE  NEGATIVE  WET PREP, GENITAL     Status: Abnormal   Collection Time    04/02/13 12:13 PM      Result Value Range   Yeast Wet Prep HPF POC NONE SEEN  NONE SEEN   Trich, Wet Prep NONE SEEN  NONE SEEN   Clue Cells Wet Prep HPF POC NONE SEEN  NONE SEEN   WBC, Wet Prep HPF POC FEW (*) NONE SEEN    MDM Lesion is most likely due to trauma from shaving; however it also vaguely resembles an early herpetic outbreak. A herpes culture was obtained. Due to her reporting recent vaginal d/c, a wet prep was also obtained and was negative.   Assessment and Plan  #Vaginal lesion -Follow results of herpes culture, and treat appropriately -Begina Lidocaine gel for pain relief -Educated on proper methods of hygiene  Joellyn Rued, Student-PA  04/02/2013, 12:36 PM

## 2013-04-05 LAB — HERPES SIMPLEX VIRUS CULTURE: Culture: NOT DETECTED

## 2013-04-06 ENCOUNTER — Emergency Department (HOSPITAL_COMMUNITY)
Admission: EM | Admit: 2013-04-06 | Discharge: 2013-04-06 | Disposition: A | Discharge status: Home / Self Care | Payer: Self-pay | Attending: Emergency Medicine | Admitting: Emergency Medicine

## 2013-04-06 ENCOUNTER — Encounter (HOSPITAL_COMMUNITY): Payer: Self-pay | Admitting: Emergency Medicine

## 2013-04-06 DIAGNOSIS — Z3202 Encounter for pregnancy test, result negative: Secondary | ICD-10-CM | POA: Insufficient documentation

## 2013-04-06 DIAGNOSIS — R209 Unspecified disturbances of skin sensation: Secondary | ICD-10-CM | POA: Insufficient documentation

## 2013-04-06 DIAGNOSIS — F17200 Nicotine dependence, unspecified, uncomplicated: Secondary | ICD-10-CM | POA: Insufficient documentation

## 2013-04-06 DIAGNOSIS — Z792 Long term (current) use of antibiotics: Secondary | ICD-10-CM | POA: Insufficient documentation

## 2013-04-06 DIAGNOSIS — R202 Paresthesia of skin: Secondary | ICD-10-CM

## 2013-04-06 DIAGNOSIS — IMO0001 Reserved for inherently not codable concepts without codable children: Secondary | ICD-10-CM | POA: Insufficient documentation

## 2013-04-06 DIAGNOSIS — J3489 Other specified disorders of nose and nasal sinuses: Secondary | ICD-10-CM | POA: Insufficient documentation

## 2013-04-06 DIAGNOSIS — Z8619 Personal history of other infectious and parasitic diseases: Secondary | ICD-10-CM | POA: Insufficient documentation

## 2013-04-06 DIAGNOSIS — Z791 Long term (current) use of non-steroidal anti-inflammatories (NSAID): Secondary | ICD-10-CM | POA: Insufficient documentation

## 2013-04-06 DIAGNOSIS — Z8679 Personal history of other diseases of the circulatory system: Secondary | ICD-10-CM | POA: Insufficient documentation

## 2013-04-06 DIAGNOSIS — Z79899 Other long term (current) drug therapy: Secondary | ICD-10-CM | POA: Insufficient documentation

## 2013-04-06 LAB — POCT I-STAT, CHEM 8
BUN: 5 mg/dL — ABNORMAL LOW (ref 6–23)
Calcium, Ion: 1.17 mmol/L (ref 1.12–1.23)
Chloride: 108 meq/L (ref 96–112)
Creatinine, Ser: 1.1 mg/dL (ref 0.50–1.10)
Glucose, Bld: 104 mg/dL — ABNORMAL HIGH (ref 70–99)
HCT: 43 % (ref 36.0–46.0)
Hemoglobin: 14.6 g/dL (ref 12.0–15.0)
Potassium: 4.3 meq/L (ref 3.5–5.1)
Sodium: 137 meq/L (ref 135–145)
TCO2: 21 mmol/L (ref 0–100)

## 2013-04-06 LAB — URINALYSIS, ROUTINE W REFLEX MICROSCOPIC
Bilirubin Urine: NEGATIVE
Glucose, UA: NEGATIVE mg/dL
Ketones, ur: NEGATIVE mg/dL
Leukocytes, UA: NEGATIVE
Nitrite: NEGATIVE
Protein, ur: NEGATIVE mg/dL
Specific Gravity, Urine: 1.007 (ref 1.005–1.030)
Urobilinogen, UA: 0.2 mg/dL (ref 0.0–1.0)
pH: 7 (ref 5.0–8.0)

## 2013-04-06 LAB — URINE MICROSCOPIC-ADD ON

## 2013-04-06 LAB — POCT PREGNANCY, URINE: Preg Test, Ur: NEGATIVE

## 2013-04-06 NOTE — Progress Notes (Signed)
[facility] provided pt with a list of primary care resources.  °

## 2013-04-06 NOTE — ED Notes (Signed)
Pt states yesterday was on a train coming back in town when she went to get up to get coffee and got a sharp pain in L thigh, states then when she got off the train both her feet started burning and she had to take her shoes off, then once she got home she felt like her whole body was numb/tingling especially her L side, states she's had some numbness in both her hands for a while but hasn't seen anyone about it, pt states she feels like she has shooting pains throughout her whole body, states at this time both her thighs hurt, pt in no distress, stroke scale negative.

## 2013-04-06 NOTE — ED Notes (Signed)
For the past 2 days now pt has been having a sinus infection and then noticed lt side facial tingling and numbness to lt side. Alert x4, no headache, no n/v no hx HTN, able to walk in triage. No obvious facial droop, no slurred speech.

## 2013-04-06 NOTE — ED Provider Notes (Addendum)
CSN: [id number]     Arrival date & time 04/06/13  1115 History   First MD Initiated Contact with Patient 04/06/13 1134     Chief Complaint  Patient presents with  . Numbness  . Tingling   (Consider location/radiation/quality/duration/timing/severity/associated sxs/prior Treatment) HPI  Patient complains of spasm in both hands and both feet for approximately one month, and a burning sensation in both feet for one month. She presents today she's developed numbness over her entire body left side greater than right, gradual onset 2 days ago. No weakness. No other complaint. Nothing makes symptoms better or worse. No treatment prior to coming here. No headache. No other associated symptoms. Patient currently under treatment for sinusitis which she's had for a week. She fell her prescription 2 days ago is. States sinus congestion is improving. Past Medical History  Diagnosis Date  . Abdominal pain, right lower quadrant   . Fibromyalgia   . Migraine   . Trichomonas 2012   Past Surgical History  Procedure Laterality Date  . Cesarean section    . Vein surgery    . Fracture surgery     Family History  Problem Relation Age of Onset  . Other Neg Hx   . Diabetes Mother   . Hypertension Mother   . Diabetes Father   . Hypertension Father   . Diabetes Other   . Hypertension Other    History  Substance Use Topics  . Smoking status: Current Every Day Smoker -- 0.25 packs/day    Types: Cigarettes  . Smokeless tobacco: Never Used  . Alcohol Use: No   OB History   Grav Para Term Preterm Abortions TAB SAB Ect Mult Living   2 2 2       2      Review of Systems  Constitutional: Negative.   HENT: Positive for congestion.   Respiratory: Negative.   Cardiovascular: Negative.   Gastrointestinal: Negative.   Genitourinary:       Irregular menses  Musculoskeletal: Positive for myalgias.  Skin: Negative.   Neurological: Positive for numbness.  Psychiatric/Behavioral: Negative.   All other  systems reviewed and are negative.    Allergies  Review of patient's allergies indicates no known allergies.  Home Medications   Current Outpatient Rx  Name  Route  Sig  Dispense  Refill  . Aspirin-Acetaminophen (GOODYS BODY PAIN PO)   Oral   Take 1 packet by mouth 3 (three) times daily as needed (pain).          [name] HYDROcodone-acetaminophen (HYCET) 7.5-325 mg/15 ml solution   Oral   Take 10 mLs by mouth 2 (two) times daily.   90 mL   0   . ibuprofen (ADVIL,MOTRIN) 800 MG tablet   Oral   Take 1 tablet (800 mg total) by mouth 3 (three) times daily.   21 tablet   0   . lidocaine (XYLOCAINE) 5 % ointment   Topical   Apply topically as needed.   35.44 g   0   . OVER THE COUNTER MEDICATION   Oral   Take 1 tablet by mouth 2 (two) times daily. Otc, allergy relief medication         . sulfamethoxazole-trimethoprim (BACTRIM) 400-80 MG per tablet   Oral   Take 1 tablet by mouth 2 (two) times daily.   14 tablet   0    BP 119/76  Pulse 81  Temp(Src) 97.7 F (36.5 C) (Oral)  Resp 16  SpO2 100%  LMP 03/21/2013  Physical Exam  Nursing note and vitals reviewed. Constitutional: She is oriented to person, place, and time. She appears well-developed and well-nourished.  HENT:  Head: Normocephalic and atraumatic.  Eyes: Conjunctivae are normal. Pupils are equal, round, and reactive to light.  Neck: Neck supple. No tracheal deviation present. No thyromegaly present.  Cardiovascular: Normal rate and regular rhythm.   No murmur heard. Pulmonary/Chest: Effort normal and breath sounds normal.  Abdominal: Soft. Bowel sounds are normal. She exhibits no distension. There is no tenderness.  Musculoskeletal: Normal range of motion. She exhibits no edema and no tenderness.  Neurological: She is alert and oriented to person, place, and time. No cranial nerve deficit. Coordination normal.  Motor strength 5 over 5 overall gait normal Romberg normal pronator drift normal  Skin: Skin  is warm and dry. No rash noted.  Psychiatric: She has a normal mood and affect.    ED Course  Procedures (including critical care time) Labs Review Labs Reviewed - No data to display Imaging Review  1:30 PM patient resting comfortably No results found.  Results for orders placed during the hospital encounter of 04/06/13  URINALYSIS, ROUTINE W REFLEX MICROSCOPIC      Result Value Range   Color, Urine YELLOW  YELLOW   APPearance CLEAR  CLEAR   Specific Gravity, Urine 1.007  1.005 - 1.030   pH 7.0  5.0 - 8.0   Glucose, UA NEGATIVE  NEGATIVE mg/dL   Hgb urine dipstick SMALL (*) NEGATIVE   Bilirubin Urine NEGATIVE  NEGATIVE   Ketones, ur NEGATIVE  NEGATIVE mg/dL   Protein, ur NEGATIVE  NEGATIVE mg/dL   Urobilinogen, UA 0.2  0.0 - 1.0 mg/dL   Nitrite NEGATIVE  NEGATIVE   Leukocytes, UA NEGATIVE  NEGATIVE  URINE MICROSCOPIC-ADD ON      Result Value Range   Squamous Epithelial / LPF RARE  RARE   RBC / HPF 3-6  <3 RBC/hpf   Bacteria, UA RARE  RARE  POCT PREGNANCY, URINE      Result Value Range   Preg Test, Ur NEGATIVE  NEGATIVE  POCT I-STAT, CHEM 8      Result Value Range   Sodium 137  135 - 145 mEq/L   Potassium 4.3  3.5 - 5.1 mEq/L   Chloride 108  96 - 112 mEq/L   BUN 5 (*) 6 - 23 mg/dL   Creatinine, Ser 4.78  0.50 - 1.10 mg/dL   Glucose, Bld 295 (*) 70 - 99 mg/dL   Calcium, Ion 6.21  3.08 - 1.23 mmol/L   TCO2 21  0 - 100 mmol/L   Hemoglobin 14.6  12.0 - 15.0 g/dL   HCT 65.7  84.6 - 96.2 %   No results found.  MDM  No diagnosis found.  Etiology of symptoms not obvious. She has a normal physical exam. Plan referral [facility] Consultation for 5 minutes on smoking cesation.  diagnosis #1 paresthesias #2 myalgias #3 tobacco abuse   Doug Sou, MD 04/06/13 1340  Doug Sou, MD 04/06/13 1341

## 2013-04-14 ENCOUNTER — Ambulatory Visit: Payer: Self-pay

## 2013-04-23 ENCOUNTER — Ambulatory Visit: Payer: Self-pay | Attending: Internal Medicine | Admitting: Internal Medicine

## 2013-04-23 ENCOUNTER — Encounter: Payer: Self-pay | Admitting: Internal Medicine

## 2013-04-23 VITALS — BP 118/82 | HR 75 | Temp 98.6°F | Resp 18 | Ht 64.57 in | Wt 178.0 lb

## 2013-04-23 DIAGNOSIS — IMO0002 Reserved for concepts with insufficient information to code with codable children: Secondary | ICD-10-CM

## 2013-04-23 DIAGNOSIS — Z0000 Encounter for general adult medical examination without abnormal findings: Secondary | ICD-10-CM

## 2013-04-23 DIAGNOSIS — M329 Systemic lupus erythematosus, unspecified: Secondary | ICD-10-CM

## 2013-04-23 LAB — BASIC METABOLIC PANEL
BUN: 10 mg/dL (ref 6–23)
CO2: 24 mEq/L (ref 19–32)
Calcium: 9.5 mg/dL (ref 8.4–10.5)
Chloride: 104 mEq/L (ref 96–112)
Creat: 0.87 mg/dL (ref 0.50–1.10)
Glucose, Bld: 64 mg/dL — ABNORMAL LOW (ref 70–99)
Potassium: 4.3 mEq/L (ref 3.5–5.3)
Sodium: 138 mEq/L (ref 135–145)

## 2013-04-23 LAB — CBC WITH DIFFERENTIAL/PLATELET
Basophils Absolute: 0 10*3/uL (ref 0.0–0.1)
Basophils Relative: 0 % (ref 0–1)
Eosinophils Absolute: 0.1 10*3/uL (ref 0.0–0.7)
Eosinophils Relative: 1 % (ref 0–5)
HCT: 37.4 % (ref 36.0–46.0)
Hemoglobin: 13.4 g/dL (ref 12.0–15.0)
Lymphocytes Relative: 47 % — ABNORMAL HIGH (ref 12–46)
Lymphs Abs: 3.2 10*3/uL (ref 0.7–4.0)
MCH: 33.3 pg (ref 26.0–34.0)
MCHC: 35.8 g/dL (ref 30.0–36.0)
MCV: 93 fL (ref 78.0–100.0)
Monocytes Absolute: 0.6 10*3/uL (ref 0.1–1.0)
Monocytes Relative: 9 % (ref 3–12)
Neutro Abs: 3 10*3/uL (ref 1.7–7.7)
Neutrophils Relative %: 43 % (ref 43–77)
Platelets: 309 10*3/uL (ref 150–400)
RBC: 4.02 MIL/uL (ref 3.87–5.11)
RDW: 12.6 % (ref 11.5–15.5)
WBC: 6.8 10*3/uL (ref 4.0–10.5)

## 2013-04-23 LAB — LIPID PANEL
Cholesterol: 170 mg/dL (ref 0–200)
HDL: 68 mg/dL (ref >39)
LDL Cholesterol: 84 mg/dL (ref 0–99)
Total CHOL/HDL Ratio: 2.5 Ratio
Triglycerides: 89 mg/dL (ref <150)
VLDL: 18 mg/dL (ref 0–40)

## 2013-04-23 LAB — RHEUMATOID FACTOR: Rhuematoid fact SerPl-aCnc: <10 IU/mL (ref <=14)

## 2013-04-23 LAB — TSH: TSH: 0.761 u[IU]/mL (ref 0.350–4.500)

## 2013-04-23 MED ORDER — PREGABALIN 50 MG PO CAPS: 50.0000 mg | ORAL_CAPSULE | Freq: Two times a day (BID) | ORAL | Status: DC

## 2013-04-23 NOTE — Progress Notes (Signed)
Patient ID: Caitlin Valdez, female   DOB: [birth year], 42 y.o.   MRN: [id number]  CC: New patient  HPI: 42 year old female with no significant past medical history who presented to clinic for evaluation of ongoing muscular cramps in hands and legs for past couple of months. Patient reports having tremors in hands. She also reports having lost strength in upper extremities and sometimes is unable to hold a cup in her hands. Patient reports different bumps on the skin and was told by emergency room physician some time ago she may be having lupus. Patient does not have a discoid type of rash. Patient reports no fevers or chills. No heat or cold intolerance. No chest pain or shortness of breath.  No Known Allergies Past Medical History  Diagnosis Date  . Abdominal pain, right lower quadrant   . Fibromyalgia   . Migraine   . Trichomonas 2012   No current outpatient prescriptions on file prior to visit.   No current facility-administered medications on file prior to visit.   Family History  Problem Relation Age of Onset  . Other Neg Hx   . Diabetes Mother   . Hypertension Mother   . Diabetes Father   . Hypertension Father   . Diabetes Other   . Hypertension Other    History   Social History  . Marital Status: Divorced    Spouse Name: N/A    Number of Children: N/A  . Years of Education: N/A   Occupational History  . Not on file.   Social History Main Topics  . Smoking status: Current Every Day Smoker -- 0.25 packs/day    Types: Cigarettes  . Smokeless tobacco: Never Used  . Alcohol Use: No  . Drug Use: No  . Sexual Activity: Yes    Birth Control/ Protection: Condom   Other Topics Concern  . Not on file   Social History Narrative  . No narrative on file    Review of Systems  Constitutional: Negative for fever, chills, diaphoresis, activity change, appetite change and fatigue.  HENT: Negative for ear pain, nosebleeds, congestion, facial swelling, rhinorrhea, neck pain,  neck stiffness and ear discharge.   Eyes: Negative for pain, discharge, redness, itching and visual disturbance.  Respiratory: Negative for cough, choking, chest tightness, shortness of breath, wheezing and stridor.   Cardiovascular: Negative for chest pain, palpitations and leg swelling.  Gastrointestinal: Negative for abdominal distention.  Genitourinary: Negative for dysuria, urgency, frequency, hematuria, flank pain, decreased urine volume, difficulty urinating and dyspareunia.  Musculoskeletal: Positive for muscular weakness, cramping in upper and lower extremities, no falls.  Neurological: Positive for tingling sensation in feet and hands, positive for tremors. No seizures.  Hematological: Negative for adenopathy. Does not bruise/bleed easily.  Psychiatric/Behavioral: Negative for hallucinations, behavioral problems, confusion, dysphoric mood, decreased concentration and agitation.    Objective:   Filed Vitals:   04/23/13 1122  BP: 118/82  Pulse: 75  Temp: 98.6 F (37 C)  Resp: 18    Physical Exam  Constitutional: Appears well-developed and well-nourished. No distress.  HENT: Normocephalic. External right and left ear normal. Oropharynx is clear and moist.  Eyes: Conjunctivae and EOM are normal. PERRLA, no scleral icterus.  Neck: Normal ROM. Neck supple. No JVD. No tracheal deviation. No thyromegaly.  CVS: RRR, S1/S2 +, no murmurs, no gallops, no carotid bruit.  Pulmonary: Effort and breath sounds normal, no stridor, rhonchi, wheezes, rales.  Abdominal: Soft. BS +,  no distension, tenderness, rebound or guarding.  Musculoskeletal: Normal  range of motion. No edema and no tenderness.  Lymphadenopathy: No lymphadenopathy noted, cervical, inguinal. Neuro: Alert. Normal reflexes, muscle tone coordination. No cranial nerve deficit. Skin: Skin is warm and dry. No rash noted. Not diaphoretic. No erythema. No pallor.  Psychiatric: Normal mood and affect. Behavior, judgment, thought  content normal.   Lab Results  Component Value Date   WBC 5.9 08/12/2011   HGB 14.6 04/06/2013   HCT 43.0 04/06/2013   MCV 93.3 08/12/2011   PLT 241 08/12/2011   Lab Results  Component Value Date   CREATININE 1.10 04/06/2013   BUN 5* 04/06/2013   NA 137 04/06/2013   K 4.3 04/06/2013   CL 108 04/06/2013   CO2 23 08/12/2011    No results found for this basename: HGBA1C   Lipid Panel  No results found for this basename: chol, trig, hdl, cholhdl, vldl, ldlcalc       Assessment and plan:   Patient Active Problem List   Diagnosis Date Noted  . Preventative health care 04/23/2013    Priority: High - Concerns for possible lupus and/or multiple sclerosis  - Obtain ANA, this antibody panel, rheumatoid factor  - Referral provided for neurology  - Patient reports previous history of fibromyalgia but has not been taking any medications and would prefer to be started on something because of ongoing pain. Prescription provided for Lyrica 50 mg twice daily.

## 2013-04-23 NOTE — Patient Instructions (Signed)

## 2013-04-23 NOTE — Progress Notes (Signed)
Pt is here today to establish care. Pt reports having fibromyalgia. Pain is always an 8, aching, shooting, stabbing and tingling.

## 2013-04-26 LAB — ANTI-DNA ANTIBODY, DOUBLE-STRANDED: ds DNA Ab: 1 IU/mL (ref <30)

## 2013-04-26 LAB — LUPUS ANTICOAGULANT PANEL
DRVVT: 29.7 secs (ref <42.9)
Lupus Anticoagulant: NOT DETECTED
PTT Lupus Anticoagulant: 27.5 secs — ABNORMAL LOW (ref 28.0–43.0)

## 2013-04-26 LAB — ANA: Anti Nuclear Antibody(ANA): NEGATIVE

## 2013-05-03 ENCOUNTER — Telehealth: Payer: Self-pay | Admitting: Internal Medicine

## 2013-05-03 NOTE — Telephone Encounter (Signed)
Pt requesting lab results.

## 2013-05-03 NOTE — Telephone Encounter (Signed)
Pt calling for blood work results.  Please f/u with pt.  °

## 2013-05-03 NOTE — Telephone Encounter (Signed)
Pt calling about lab results.  Says she was talking to nurse but call dropped.

## 2013-05-03 NOTE — Telephone Encounter (Signed)
Pt given lab results. Pt concerned with Lupus test due to increased numbness and pain

## 2013-05-04 NOTE — Telephone Encounter (Signed)
Pt given negative lab results. States she is having so much nerve pain all over. Pt has hx. Fibromyalgia and takes  Lyrica 50 mg BID. Scheduled f/u appt.

## 2013-05-04 NOTE — Telephone Encounter (Signed)
Please call to inform patient that all her her lab results are within normal limits. The high labs for lupus are negative, and rheumatoid factor all is also negative for rheumatoid arthritis. In summary, from the lab results there is no evidence that she has lupus or rheumatoid arthritis

## 2013-05-05 ENCOUNTER — Telehealth: Payer: Self-pay

## 2013-05-11 NOTE — Telephone Encounter (Signed)
Pt called in again requesting script for c/o pain all over uncontrolled with Lyrica 50 mg tab.

## 2013-05-27 ENCOUNTER — Ambulatory Visit: Payer: Self-pay | Admitting: Internal Medicine

## 2013-06-02 NOTE — Telephone Encounter (Signed)
Patient will need to be reevaluated

## 2013-06-16 ENCOUNTER — Encounter (HOSPITAL_COMMUNITY): Payer: Self-pay | Admitting: Emergency Medicine

## 2013-06-16 ENCOUNTER — Emergency Department (HOSPITAL_COMMUNITY)
Admission: EM | Admit: 2013-06-16 | Discharge: 2013-06-17 | Disposition: A | Discharge status: Home / Self Care | Payer: Self-pay | Attending: Emergency Medicine | Admitting: Emergency Medicine

## 2013-06-16 DIAGNOSIS — Z87448 Personal history of other diseases of urinary system: Secondary | ICD-10-CM | POA: Insufficient documentation

## 2013-06-16 DIAGNOSIS — IMO0001 Reserved for inherently not codable concepts without codable children: Secondary | ICD-10-CM | POA: Insufficient documentation

## 2013-06-16 DIAGNOSIS — G43909 Migraine, unspecified, not intractable, without status migrainosus: Secondary | ICD-10-CM | POA: Insufficient documentation

## 2013-06-16 DIAGNOSIS — F17200 Nicotine dependence, unspecified, uncomplicated: Secondary | ICD-10-CM | POA: Insufficient documentation

## 2013-06-16 MED ORDER — DIPHENHYDRAMINE HCL 50 MG/ML IJ SOLN
50.0000 mg | Freq: Once | INTRAMUSCULAR | Status: AC
  Administered 2013-06-16: 50 mg via INTRAVENOUS
  Filled 2013-06-16: qty 1

## 2013-06-16 MED ORDER — KETOROLAC TROMETHAMINE 30 MG/ML IJ SOLN
30.0000 mg | Freq: Once | INTRAMUSCULAR | Status: AC
  Administered 2013-06-16: 30 mg via INTRAVENOUS
  Filled 2013-06-16: qty 1

## 2013-06-16 MED ORDER — ONDANSETRON 4 MG PO TBDP: ORAL_TABLET | ORAL | Status: DC

## 2013-06-16 MED ORDER — SODIUM CHLORIDE 0.9 % IV BOLUS (SEPSIS)
1000.0000 mL | Freq: Once | INTRAVENOUS | Status: AC
  Administered 2013-06-16: 1000 mL via INTRAVENOUS

## 2013-06-16 MED ORDER — METOCLOPRAMIDE HCL 5 MG/ML IJ SOLN
10.0000 mg | Freq: Once | INTRAMUSCULAR | Status: AC
  Administered 2013-06-16: 10 mg via INTRAVENOUS
  Filled 2013-06-16: qty 2

## 2013-06-16 NOTE — ED Provider Notes (Signed)
CSN: [id number]     Arrival date & time 06/16/13  2129 History   First MD Initiated Contact with Patient 06/16/13 2143     Chief Complaint  Patient presents with  . Migraine   (Consider location/radiation/quality/duration/timing/severity/associated sxs/prior Treatment) HPI Comments: 42 yo female with smoking hx presents with migraine sxs since 2 pm today.  Similar to previous.  Pt has neuro fup outpatient.  Gradual onset, no injuries.  No blood clot hx.  Improves with migraine cocktail in the past. Light worsens.   Patient is a 42 y.o. female presenting with migraines. The history is provided by the patient.  Migraine This is a recurrent problem. Associated symptoms include headaches. Pertinent negatives include no chest pain, no abdominal pain and no shortness of breath.    Past Medical History  Diagnosis Date  . Abdominal pain, right lower quadrant   . Fibromyalgia   . Migraine   . Trichomonas 2012   Past Surgical History  Procedure Laterality Date  . Cesarean section    . Vein surgery    . Fracture surgery     Family History  Problem Relation Age of Onset  . Other Neg Hx   . Diabetes Mother   . Hypertension Mother   . Diabetes Father   . Hypertension Father   . Diabetes Other   . Hypertension Other    History  Substance Use Topics  . Smoking status: Current Every Day Smoker -- 0.25 packs/day    Types: Cigarettes  . Smokeless tobacco: Never Used  . Alcohol Use: No   OB History   Grav Para Term Preterm Abortions TAB SAB Ect Mult Living   2 2 2       2      Review of Systems  Constitutional: Negative for fever and chills.  HENT: Negative for congestion.   Eyes: Positive for photophobia. Negative for visual disturbance.  Respiratory: Negative for shortness of breath.   Cardiovascular: Negative for chest pain.  Gastrointestinal: Negative for vomiting and abdominal pain.  Genitourinary: Negative for dysuria and flank pain.  Musculoskeletal: Negative for neck pain  and neck stiffness.  Skin: Negative for rash.  Neurological: Positive for headaches. Negative for weakness and light-headedness.    Allergies  Review of patient's allergies indicates no known allergies.  Home Medications   Current Outpatient Rx  Name  Route  Sig  Dispense  Refill  . Aspirin-Acetaminophen-Caffeine (GOODY HEADACHE PO)   Oral   Take 1 packet by mouth every 4 (four) hours as needed (for headache).         . OVER THE COUNTER MEDICATION   Oral   Take 1 tablet by mouth every 4 (four) hours as needed (for headache). [employer] tree brand headache medication         . pregabalin (LYRICA) 50 MG capsule   Oral   Take 50 mg by mouth 2 (two) times daily.          BP 125/82  Pulse 68  Temp(Src) 97.8 F (36.6 C) (Oral)  Resp 20  SpO2 100%  LMP 05/18/2013 Physical Exam  Nursing note and vitals reviewed. Constitutional: She is oriented to person, place, and time. She appears well-developed and well-nourished.  HENT:  Head: Normocephalic and atraumatic.  Eyes: Conjunctivae are normal. Right eye exhibits no discharge. Left eye exhibits no discharge.  Neck: Normal range of motion. Neck supple. No tracheal deviation present.  Cardiovascular: Normal rate and regular rhythm.   Pulmonary/Chest: Effort normal.  Abdominal: Soft.  Musculoskeletal: She exhibits no edema.  Neurological: She is alert and oriented to person, place, and time. GCS eye subscore is 4. GCS verbal subscore is 5. GCS motor subscore is 6.  5+ strength in UE and LE with f/e at major joints. Sensation to palpation intact in UE and LE. CNs 2-12 grossly intact.  EOMFI.  PERRL.   Finger nose and coordination intact bilateral.   Visual fields intact to finger testing.   Skin: Skin is warm. No rash noted.  Psychiatric: She has a normal mood and affect.    ED Course  Procedures (including critical care time) Labs Review Labs Reviewed - No data to display Imaging Review No results found.  EKG  Interpretation   None       MDM  No diagnosis found. Similar to previous. Migraine cocktail and fluids given. No signs of meningitis or SAH Pt has outpt fup.   Results and differential diagnosis were discussed with the patient. Close follow up outpatient was discussed, patient comfortable with the plan.   Signed out to recheck and dispo.   Migraine HA    Enid Skeens, MD 06/16/13 2340

## 2013-06-16 NOTE — ED Notes (Signed)
Pt states that her migraine started 200 p.m today. Pt states she took OTC meds and goody powder with no relief. Pt denies any n/v. Pt reports blurred vision, photosensitivity and noise that makes head hurt worse. Appt to see neuro Friday but got rescheduled.

## 2013-06-17 NOTE — ED Provider Notes (Signed)
Patient signed out to me by Dr. Jodi Mourning. Reassessed and her headache is greatly improved. She is stable for discharge  Toy Baker, MD 06/17/13 [phone]

## 2013-06-21 ENCOUNTER — Telehealth: Payer: Self-pay | Admitting: Internal Medicine

## 2013-06-21 DIAGNOSIS — M4802 Spinal stenosis, cervical region: Secondary | ICD-10-CM

## 2013-06-21 NOTE — Telephone Encounter (Signed)
Pt has been taking Lyrica, now has bumps on gums and tongue feels swollen.  Pt has been taking it about 2-3 weeks and would like to know what to do.  Pt was ED in the week due to headaches and blurry vision.  Pt rescheduled missed f/u appt for 06/22/13 but would like to speak to nurse beforehand.  Please f/u with pt.

## 2013-06-22 ENCOUNTER — Ambulatory Visit: Payer: Self-pay | Attending: Internal Medicine | Admitting: Internal Medicine

## 2013-06-22 ENCOUNTER — Encounter: Payer: Self-pay | Admitting: Internal Medicine

## 2013-06-22 VITALS — BP 122/89 | HR 71 | Temp 98.7°F | Resp 14 | Ht 63.0 in | Wt 172.8 lb

## 2013-06-22 DIAGNOSIS — G43909 Migraine, unspecified, not intractable, without status migrainosus: Secondary | ICD-10-CM

## 2013-06-22 DIAGNOSIS — K089 Disorder of teeth and supporting structures, unspecified: Secondary | ICD-10-CM | POA: Insufficient documentation

## 2013-06-22 LAB — SEDIMENTATION RATE: Sed Rate: 6 mm/hr (ref 0–22)

## 2013-06-22 LAB — URIC ACID: Uric Acid, Serum: 4.7 mg/dL (ref 2.4–7.0)

## 2013-06-22 MED ORDER — DULOXETINE HCL 60 MG PO CPEP: 60.0000 mg | ORAL_CAPSULE | Freq: Every day | ORAL | Status: DC

## 2013-06-22 MED ORDER — TRAZODONE HCL 50 MG PO TABS: 25.0000 mg | ORAL_TABLET | Freq: Every evening | ORAL | Status: DC | PRN

## 2013-06-22 MED ORDER — CLINDAMYCIN HCL 300 MG PO CAPS: 300.0000 mg | ORAL_CAPSULE | Freq: Four times a day (QID) | ORAL | Status: DC

## 2013-06-22 MED ORDER — TRAZODONE HCL 50 MG PO TABS
25.0000 mg | ORAL_TABLET | Freq: Every evening | ORAL | Status: DC | PRN
Start: 2013-06-22 — End: 2013-06-22

## 2013-06-22 MED ORDER — CLINDAMYCIN HCL 300 MG PO CAPS: 300.0000 mg | ORAL_CAPSULE | Freq: Four times a day (QID) | ORAL | Status: AC
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
## 2013-06-22 NOTE — Progress Notes (Signed)
Pt is her for a f/u from last visit. Suffers from fibromyalgia.  Pt complains that Lyrica makes her tongue tingles and doesn't help. Also complains of trouble sleeping.

## 2013-06-30 ENCOUNTER — Ambulatory Visit (HOSPITAL_COMMUNITY): Admission: RE | Admit: 2013-06-30 | Payer: Self-pay | Source: Ambulatory Visit

## 2013-06-30 ENCOUNTER — Ambulatory Visit (HOSPITAL_COMMUNITY)
Admission: RE | Admit: 2013-06-30 | Discharge: 2013-06-30 | Disposition: A | Discharge status: Home / Self Care | Payer: Self-pay | Source: Ambulatory Visit | Attending: Internal Medicine | Admitting: Internal Medicine

## 2013-06-30 DIAGNOSIS — M549 Dorsalgia, unspecified: Secondary | ICD-10-CM | POA: Insufficient documentation

## 2013-06-30 DIAGNOSIS — G43909 Migraine, unspecified, not intractable, without status migrainosus: Secondary | ICD-10-CM

## 2013-06-30 DIAGNOSIS — R209 Unspecified disturbances of skin sensation: Secondary | ICD-10-CM | POA: Insufficient documentation

## 2013-06-30 DIAGNOSIS — M4802 Spinal stenosis, cervical region: Secondary | ICD-10-CM | POA: Insufficient documentation

## 2013-06-30 DIAGNOSIS — R51 Headache: Secondary | ICD-10-CM | POA: Insufficient documentation

## 2013-06-30 DIAGNOSIS — M542 Cervicalgia: Secondary | ICD-10-CM | POA: Insufficient documentation

## 2013-06-30 MED ORDER — GADOBENATE DIMEGLUMINE 529 MG/ML IV SOLN
15.0000 mL | Freq: Once | INTRAVENOUS | Status: AC | PRN
  Administered 2013-06-30: 15 mL via INTRAVENOUS

## 2013-07-01 ENCOUNTER — Telehealth: Payer: Self-pay | Admitting: *Deleted

## 2013-07-01 NOTE — Telephone Encounter (Signed)
notified patient that there were abnormal MRI of the C-spine with cervical spinal stenosis and need for urgent neurosurgery consultation. Consultation has been requested, please ask the patient to coordinate her appointment with our scheduler NORA to expedite the process. Forwarded pt to Arna Medici for further assistance.

## 2013-07-23 ENCOUNTER — Other Ambulatory Visit: Payer: Self-pay | Admitting: Internal Medicine

## 2013-07-23 MED ORDER — DULOXETINE HCL 60 MG PO CPEP: 60.0000 mg | ORAL_CAPSULE | Freq: Every day | ORAL | Status: DC

## 2013-08-03 ENCOUNTER — Ambulatory Visit: Payer: Self-pay | Admitting: Internal Medicine

## 2013-08-09 ENCOUNTER — Telehealth: Payer: Self-pay | Admitting: Internal Medicine

## 2013-08-09 NOTE — Telephone Encounter (Signed)
Pt called in today to cancel her appt.; pt is scripted DULoxetine (CYMBALTA) 60 MG capsule and wanted to know if she could come in earlier than her 11:30 appt to receive a refill; pt is unable to make appt because she has just acquired a new job; please advise pt on medication;

## 2013-08-10 ENCOUNTER — Ambulatory Visit: Payer: Self-pay

## 2013-08-13 ENCOUNTER — Ambulatory Visit: Payer: Self-pay

## 2013-10-26 ENCOUNTER — Ambulatory Visit: Payer: Self-pay

## 2013-10-28 ENCOUNTER — Encounter (HOSPITAL_COMMUNITY): Payer: Self-pay | Admitting: Emergency Medicine

## 2013-10-28 ENCOUNTER — Emergency Department (HOSPITAL_COMMUNITY)
Admission: EM | Admit: 2013-10-28 | Discharge: 2013-10-28 | Disposition: A | Discharge status: Home / Self Care | Payer: Self-pay | Attending: Emergency Medicine | Admitting: Emergency Medicine

## 2013-10-28 DIAGNOSIS — M797 Fibromyalgia: Secondary | ICD-10-CM

## 2013-10-28 DIAGNOSIS — F17200 Nicotine dependence, unspecified, uncomplicated: Secondary | ICD-10-CM | POA: Insufficient documentation

## 2013-10-28 DIAGNOSIS — IMO0001 Reserved for inherently not codable concepts without codable children: Secondary | ICD-10-CM | POA: Insufficient documentation

## 2013-10-28 DIAGNOSIS — M791 Myalgia, unspecified site: Secondary | ICD-10-CM

## 2013-10-28 DIAGNOSIS — Z79899 Other long term (current) drug therapy: Secondary | ICD-10-CM | POA: Insufficient documentation

## 2013-10-28 DIAGNOSIS — R110 Nausea: Secondary | ICD-10-CM | POA: Insufficient documentation

## 2013-10-28 DIAGNOSIS — Z8619 Personal history of other infectious and parasitic diseases: Secondary | ICD-10-CM | POA: Insufficient documentation

## 2013-10-28 DIAGNOSIS — G43909 Migraine, unspecified, not intractable, without status migrainosus: Secondary | ICD-10-CM | POA: Insufficient documentation

## 2013-10-28 DIAGNOSIS — Z3202 Encounter for pregnancy test, result negative: Secondary | ICD-10-CM | POA: Insufficient documentation

## 2013-10-28 LAB — POC URINE PREG, ED: Preg Test, Ur: NEGATIVE

## 2013-10-28 MED ORDER — HYDROCODONE-ACETAMINOPHEN 5-325 MG PO TABS: 1.0000 | ORAL_TABLET | ORAL | Status: DC | PRN

## 2013-10-28 MED ORDER — OXYCODONE-ACETAMINOPHEN 5-325 MG PO TABS
1.0000 | ORAL_TABLET | Freq: Once | ORAL | Status: AC
  Administered 2013-10-28: 1 via ORAL
  Filled 2013-10-28: qty 1

## 2013-10-28 MED ORDER — KETOROLAC TROMETHAMINE 15 MG/ML IJ SOLN
15.0000 mg | Freq: Once | INTRAMUSCULAR | Status: AC
  Administered 2013-10-28: 15 mg via INTRAMUSCULAR
  Filled 2013-10-28: qty 1

## 2013-10-28 NOTE — Discharge Instructions (Signed)
Call for a follow up appointment with a Family or Primary Care Provider.  Return if Symptoms worsen.   Take medication as prescribed.   Call [facility] [place], [place] [phone]

## 2013-10-28 NOTE — ED Provider Notes (Signed)
CSN: [id number]     Arrival date & time 10/28/13  1816 History   First MD Initiated Contact with Patient 10/28/13 2015     Chief Complaint  Patient presents with  . Generalized Body Aches     (Consider location/radiation/quality/duration/timing/severity/associated sxs/prior Treatment) HPI Comments: Caitlin Valdez is a 43 y.o. female with a past medical history of fibromyalgia presenting the Emergency Department with a chief complaint of generalize myalgias for 2 days.  She reports neck pain, bilateral upper extremity pain, lower extremity pain.  She reports taking cymbalta without releif of symptoms.  She reports waking up to upper extremity paresthesias for almost a year, dneis numbness or weakness in the ED. PCP: [facility]   The history is provided by the patient. No language interpreter was used.    Past Medical History  Diagnosis Date  . Abdominal pain, right lower quadrant   . Fibromyalgia   . Migraine   . Trichomonas 2012   Past Surgical History  Procedure Laterality Date  . Cesarean section    . Vein surgery    . Fracture surgery     Family History  Problem Relation Age of Onset  . Other Neg Hx   . Diabetes Mother   . Hypertension Mother   . Diabetes Father   . Hypertension Father   . Diabetes Other   . Hypertension Other    History  Substance Use Topics  . Smoking status: Current Every Day Smoker -- 0.25 packs/day    Types: Cigarettes  . Smokeless tobacco: Never Used  . Alcohol Use: No   OB History   Grav Para Term Preterm Abortions TAB SAB Ect Mult Living   2 2 2       2      Review of Systems  Constitutional: Negative for fever and chills.  HENT: Negative for congestion.   Respiratory: Negative for cough.   Gastrointestinal: Positive for nausea. Negative for vomiting, abdominal pain and diarrhea.  Musculoskeletal: Positive for myalgias and neck pain. Negative for arthralgias and back pain.  Neurological: Negative for weakness and numbness.       Allergies  Review of patient's allergies indicates no known allergies.  Home Medications   Prior to Admission medications   Medication Sig Start Date End Date Taking? Authorizing Provider  DULoxetine (CYMBALTA) 60 MG capsule Take 1 capsule (60 mg total) by mouth daily. 07/23/13  Yes Angelica Chessman, MD  traZODone (DESYREL) 50 MG tablet Take 0.5-1 tablets (25-50 mg total) by mouth at bedtime as needed for sleep. 06/22/13  Yes Reyne Dumas, MD   BP 134/83  Pulse 77  Temp(Src) 98 F (36.7 C) (Oral)  Resp 16  SpO2 100%  LMP 10/16/2013 Physical Exam  Nursing note and vitals reviewed. Constitutional: She is oriented to person, place, and time. She appears well-developed and well-nourished. No distress.  HENT:  Head: Normocephalic and atraumatic.  Eyes: EOM are normal. Pupils are equal, round, and reactive to light. No scleral icterus.  Neck: Normal range of motion. Neck supple. Muscular tenderness present.  Cardiovascular: Normal rate, regular rhythm and normal heart sounds.   No murmur heard. Pulmonary/Chest: Effort normal and breath sounds normal. No respiratory distress. She has no decreased breath sounds. She has no wheezes. She has no rhonchi. She has no rales.  Abdominal: Soft. Bowel sounds are normal. There is no tenderness. There is no rebound and no guarding.  Musculoskeletal: Normal range of motion. She exhibits no edema.  Tenderness to palpation of bilateral paravertebral  C-spine. No midline C-spine, T-spine, or L-spine tenderness with no step-offs, crepitus, or deformities noted.  Moves all 4 extremities. Good strength and sensation in upper and lower extremities.  Lymphadenopathy:       Head (right side): No submental, no submandibular, no tonsillar, no preauricular, no posterior auricular and no occipital adenopathy present.       Head (left side): No submental, no submandibular, no tonsillar, no preauricular, no posterior auricular and no occipital adenopathy  present.       Right cervical: No superficial cervical and no posterior cervical adenopathy present.      Left cervical: No superficial cervical and no posterior cervical adenopathy present.  Neurological: She is alert and oriented to person, place, and time.  Skin: Skin is warm and dry. No rash noted.  Psychiatric: She has a normal mood and affect.    ED Course  Procedures (including critical care time) Labs Review Labs Reviewed  POC URINE PREG, ED    Imaging Review No results found.   EKG Interpretation None      MDM   Final diagnoses:  Myalgia  Fibromyalgia   Pt with fibromyalgia pain.  [facility].  Negative urine pregnancy. Cervical muscle tenders on palpation, no decrease ROM, good ROM and strength to bilateral upper extremities.  Discussed pain management doctor and a neurologist for further evaluation of symptoms, with the patient. Discussed treatment plan with the patient. Return precautions given. Reports understanding and no other concerns at this time.  Patient is stable for discharge at this time.  Meds given in ED:  Medications  oxyCODONE-acetaminophen (PERCOCET/ROXICET) 5-325 MG per tablet 1 tablet (1 tablet Oral Given 10/28/13 2131)  ketorolac (TORADOL) 15 MG/ML injection 15 mg (15 mg Intramuscular Given 10/28/13 2131)    Discharge Medication List as of 10/28/2013  9:41 PM    START taking these medications   Details  HYDROcodone-acetaminophen (NORCO/VICODIN) 5-325 MG per tablet Take 1 tablet by mouth every 4 (four) hours as needed., Starting 10/28/2013, Until Discontinued, Print            Lorrine Kin, PA-C 10/29/13 1445

## 2013-10-28 NOTE — ED Notes (Signed)
Per pt, hx of fibromyalgia.  Pt having increased generalized body.  Pt decreased appetite and nausea 2 days ago.  No new fever, vomiting or diarrhea.  Pt takes Cymbalta without relief.

## 2013-11-01 NOTE — ED Provider Notes (Signed)
Medical screening examination/treatment/procedure(s) were performed by non-physician practitioner and as supervising physician I was immediately available for consultation/collaboration.   EKG Interpretation None       Babette Relic, MD 11/01/13 2231

## 2013-11-09 ENCOUNTER — Ambulatory Visit: Payer: Self-pay | Attending: Internal Medicine

## 2013-12-13 ENCOUNTER — Encounter: Payer: Self-pay | Admitting: Internal Medicine

## 2013-12-22 ENCOUNTER — Encounter: Payer: Self-pay | Admitting: Internal Medicine

## 2013-12-23 ENCOUNTER — Encounter: Payer: Self-pay | Admitting: Internal Medicine

## 2013-12-23 ENCOUNTER — Ambulatory Visit: Payer: Self-pay | Attending: Internal Medicine | Admitting: Internal Medicine

## 2013-12-23 VITALS — BP 117/81 | HR 78 | Temp 98.6°F | Resp 16 | Ht 63.0 in | Wt 175.0 lb

## 2013-12-23 DIAGNOSIS — M4802 Spinal stenosis, cervical region: Secondary | ICD-10-CM

## 2013-12-23 DIAGNOSIS — Z0000 Encounter for general adult medical examination without abnormal findings: Secondary | ICD-10-CM

## 2013-12-23 DIAGNOSIS — M797 Fibromyalgia: Secondary | ICD-10-CM

## 2013-12-23 DIAGNOSIS — Z124 Encounter for screening for malignant neoplasm of cervix: Secondary | ICD-10-CM

## 2013-12-23 DIAGNOSIS — Z1239 Encounter for other screening for malignant neoplasm of breast: Secondary | ICD-10-CM

## 2013-12-23 DIAGNOSIS — Z1272 Encounter for screening for malignant neoplasm of vagina: Secondary | ICD-10-CM | POA: Insufficient documentation

## 2013-12-23 DIAGNOSIS — Z01419 Encounter for gynecological examination (general) (routine) without abnormal findings: Secondary | ICD-10-CM | POA: Insufficient documentation

## 2013-12-23 DIAGNOSIS — Z1231 Encounter for screening mammogram for malignant neoplasm of breast: Secondary | ICD-10-CM | POA: Insufficient documentation

## 2013-12-23 DIAGNOSIS — IMO0001 Reserved for inherently not codable concepts without codable children: Secondary | ICD-10-CM | POA: Insufficient documentation

## 2013-12-23 DIAGNOSIS — G894 Chronic pain syndrome: Secondary | ICD-10-CM | POA: Insufficient documentation

## 2013-12-23 LAB — CBC WITH DIFFERENTIAL/PLATELET
Basophils Absolute: 0 10*3/uL (ref 0.0–0.1)
Basophils Relative: 0 % (ref 0–1)
Eosinophils Absolute: 0.1 10*3/uL (ref 0.0–0.7)
Eosinophils Relative: 1 % (ref 0–5)
HCT: 37.8 % (ref 36.0–46.0)
Hemoglobin: 13.5 g/dL (ref 12.0–15.0)
Lymphocytes Relative: 55 % — ABNORMAL HIGH (ref 12–46)
Lymphs Abs: 4.6 10*3/uL — ABNORMAL HIGH (ref 0.7–4.0)
MCH: 32.3 pg (ref 26.0–34.0)
MCHC: 35.7 g/dL (ref 30.0–36.0)
MCV: 90.4 fL (ref 78.0–100.0)
Monocytes Absolute: 0.7 10*3/uL (ref 0.1–1.0)
Monocytes Relative: 9 % (ref 3–12)
Neutro Abs: 2.9 10*3/uL (ref 1.7–7.7)
Neutrophils Relative %: 35 % — ABNORMAL LOW (ref 43–77)
Platelets: 311 10*3/uL (ref 150–400)
RBC: 4.18 MIL/uL (ref 3.87–5.11)
RDW: 12.9 % (ref 11.5–15.5)
WBC: 8.3 10*3/uL (ref 4.0–10.5)

## 2013-12-23 LAB — POCT GLYCOSYLATED HEMOGLOBIN (HGB A1C): Hemoglobin A1C: 5.2

## 2013-12-23 MED ORDER — ACETAMINOPHEN-CODEINE #3 300-30 MG PO TABS: 1.0000 | ORAL_TABLET | ORAL | Status: DC | PRN

## 2013-12-23 NOTE — Progress Notes (Signed)
Patient ID: Caitlin Valdez, female   DOB: 1971-01-25, 43 y.o.   MRN: [id number]   Caitlin Valdez, is a 43 y.o. female  [id number]:[id number]  [id number]  DOB - 1970-12-13  Chief Complaint  Patient presents with  . Follow-up        Subjective:   Caitlin Valdez is a 43 y.o. female here today for a follow up visit. Patient has medical history significant for fibromyalgia with chronic pain syndrome. She is here today for routine followup and to have annual physical examination including Pap smear done. She has no new complaint but ongoing pain in all the joints especially the neck. She feels sometimes her neck is stiff. Last MRI of her cervical spine showed disc and endplate degeneration from C4-C5 to C6-C7. There is moderate multifactorial spinal stenosis with spinal cord mass effect at C6 and C7. No cord signal abnormality. Mild spinal stenosis at the other 2 levels. Patient has not seen a neurosurgery. She does not have new weakness or tingling or numbness. Patient has No headache, No chest pain, No abdominal pain - No Nausea, No Cough - SOB.  Problem  Fibromyalgia  Pap Smear for Cervical Cancer Screening  Breast Cancer Screening    ALLERGIES: No Known Allergies  PAST MEDICAL HISTORY: Past Medical History  Diagnosis Date  . Abdominal pain, right lower quadrant   . Fibromyalgia   . Migraine   . Trichomonas 2012    MEDICATIONS AT HOME: Prior to Admission medications   Medication Sig Start Date End Date Taking? Authorizing Provider  DULoxetine (CYMBALTA) 60 MG capsule Take 1 capsule (60 mg total) by mouth daily. 07/23/13  Yes Angelica Chessman, MD  acetaminophen-codeine (TYLENOL #3) 300-30 MG per tablet Take 1 tablet by mouth every 4 (four) hours as needed. 12/23/13   Angelica Chessman, MD  HYDROcodone-acetaminophen (NORCO/VICODIN) 5-325 MG per tablet Take 1 tablet by mouth every 4 (four) hours as needed. 10/28/13   Lauren Burnetta Sabin, PA-C  traZODone (DESYREL) 50 MG tablet Take 0.5-1 tablets (25-50  mg total) by mouth at bedtime as needed for sleep. 06/22/13   Reyne Dumas, MD     Objective:   Filed Vitals:   12/23/13 1619  BP: 117/81  Pulse: 78  Temp: 98.6 F (37 C)  TempSrc: Oral  Resp: 16  Height: 5\' 3"  (1.6 m)  Weight: 175 lb (79.379 kg)  SpO2: 94%    Exam General appearance : Awake, alert, not in any distress. Speech Clear. Not toxic looking HEENT: Atraumatic and Normocephalic, pupils equally reactive to light and accomodation Neck: supple, no JVD. No cervical lymphadenopathy.  Chest:Good air entry bilaterally, no added sounds  CVS: S1 S2 regular, no murmurs.  Abdomen: Bowel sounds present, Non tender and not distended with no gaurding, rigidity or rebound. Extremities: B/L Lower Ext shows no edema, both legs are warm to touch Neurology: Awake alert, and oriented X 3, CN II-XII intact, Non focal Skin:No Rash Wounds:N/A  Data Review No results found for this basename: HGBA1C     Assessment & Plan   1. Fibromyalgia Continue Cymbalta  2. Pap smear for cervical cancer screening  - Cytology - PAP - Cervicovaginal ancillary only  3. Breast cancer screening  - MM Digital Screening; Future  4. Spinal stenosis in cervical region  - Ambulatory referral to Neurosurgery - acetaminophen-codeine (TYLENOL #3) 300-30 MG per tablet; Take 1 tablet by mouth every 4 (four) hours as needed.  Dispense: 60 tablet; Refill: 0  5. Annual physical exam  -  COMPLETE METABOLIC PANEL WITH GFR - POCT glycosylated hemoglobin (Hb A1C) - Lipid panel - TSH - Urinalysis, Complete - CBC with Differential   Patient was counseled extensively about nutrition and exercise  Return in about 6 months (around 06/24/2014), or if symptoms worsen or fail to improve, for Follow up Pain and comorbidities.  The patient was given clear instructions to go to ER or return to medical center if symptoms don't improve, worsen or new problems develop. The patient verbalized understanding. The  patient was told to call to get lab results if they haven't heard anything in the next week.   This note has been created with Surveyor, quantity. Any transcriptional errors are unintentional.    Angelica Chessman, MD, [place], [place], [place] and [place], [place]   12/23/2013, 5:27 PM

## 2013-12-23 NOTE — Progress Notes (Signed)
Pt is here today following up on her fibromyalgia. Pt is requesting a Pap and physical.

## 2013-12-23 NOTE — Patient Instructions (Signed)

## 2013-12-24 ENCOUNTER — Telehealth: Payer: Self-pay | Admitting: *Deleted

## 2013-12-24 LAB — URINALYSIS, COMPLETE
Bacteria, UA: NONE SEEN
Bilirubin Urine: NEGATIVE
Casts: NONE SEEN
Crystals: NONE SEEN
Glucose, UA: NEGATIVE mg/dL
Ketones, ur: NEGATIVE mg/dL
Leukocytes, UA: NEGATIVE
Nitrite: NEGATIVE
Protein, ur: NEGATIVE mg/dL
Specific Gravity, Urine: 1.015 (ref 1.005–1.030)
Squamous Epithelial / LPF: NONE SEEN
Urobilinogen, UA: 1 mg/dL (ref 0.0–1.0)
pH: 6 (ref 5.0–8.0)

## 2013-12-24 LAB — LIPID PANEL
Cholesterol: 181 mg/dL (ref 0–200)
HDL: 75 mg/dL (ref >39)
LDL Cholesterol: 85 mg/dL (ref 0–99)
Total CHOL/HDL Ratio: 2.4 Ratio
Triglycerides: 103 mg/dL (ref <150)
VLDL: 21 mg/dL (ref 0–40)

## 2013-12-24 LAB — COMPLETE METABOLIC PANEL WITH GFR
ALT: 11 U/L (ref 0–35)
AST: 15 U/L (ref 0–37)
Albumin: 4.4 g/dL (ref 3.5–5.2)
Alkaline Phosphatase: 76 U/L (ref 39–117)
BUN: 6 mg/dL (ref 6–23)
CO2: 25 mEq/L (ref 19–32)
Calcium: 9.5 mg/dL (ref 8.4–10.5)
Chloride: 103 mEq/L (ref 96–112)
Creat: 0.84 mg/dL (ref 0.50–1.10)
GFR, Est African American: >89 mL/min
GFR, Est Non African American: 86 mL/min
Glucose, Bld: 85 mg/dL (ref 70–99)
Potassium: 4 mEq/L (ref 3.5–5.3)
Sodium: 137 mEq/L (ref 135–145)
Total Bilirubin: 0.4 mg/dL (ref 0.2–1.2)
Total Protein: 7.7 g/dL (ref 6.0–8.3)

## 2013-12-24 LAB — TSH: TSH: 0.78 u[IU]/mL (ref 0.350–4.500)

## 2013-12-24 NOTE — Telephone Encounter (Signed)
Message copied by Joan Mayans on Fri Dec 24, 2013 12:36 PM ------      Message from: Tresa Garter      Created: Fri Dec 24, 2013 10:03 AM       Please inform patient that her laboratory tests results are within normal limits ------

## 2013-12-24 NOTE — Telephone Encounter (Signed)
Pt is aware of her lab results.  

## 2013-12-27 ENCOUNTER — Telehealth: Payer: Self-pay | Admitting: Internal Medicine

## 2013-12-27 NOTE — Telephone Encounter (Signed)
I spoke to the pt she said that she was in extreme pain due to fibromyalgia. She said that its hard to get out of her bed. She was requesting a cortisone shot. I told her we didn't give that shot here that she should go to the E.R. or the urgent care  If her pain got any worse.

## 2013-12-27 NOTE — Telephone Encounter (Signed)
Pt called in to request that Nurse Rachel Bo call her at his earliest convenience;

## 2013-12-28 ENCOUNTER — Telehealth: Payer: Self-pay | Admitting: *Deleted

## 2013-12-28 ENCOUNTER — Telehealth: Payer: Self-pay | Admitting: Internal Medicine

## 2013-12-28 LAB — CYTOLOGY - PAP

## 2013-12-28 NOTE — Telephone Encounter (Signed)
I spoke to the pt she stated that she was able to go to work today but that she is in severe pain. I instructed the pt that if she still felt this bad that she should come in tomorrow and be seen by the doctor.

## 2013-12-28 NOTE — Telephone Encounter (Signed)
Pt would like to speak to [place], [place].  Please f/u with pt.

## 2014-01-04 ENCOUNTER — Telehealth: Payer: Self-pay | Admitting: Emergency Medicine

## 2014-01-04 MED ORDER — METRONIDAZOLE 500 MG PO TABS: 2000.0000 mg | ORAL_TABLET | Freq: Once | ORAL | Status: DC

## 2014-01-04 NOTE — Telephone Encounter (Signed)
Pt given pap smear results with new medication instructions to start taking prescribed Flagyl 2 g once Script e-scribed to preferred [facility]

## 2014-01-04 NOTE — Telephone Encounter (Signed)
Message copied by Ricci Barker on Tue Jan 04, 2014 10:45 AM ------      Message from: Tresa Garter      Created: Sun Jan 02, 2014  5:20 PM       Please inform patient that her Pap smear cytology is negative for malignancy. There is evidence of infection due to bacteria vaginosis, this is not a sexually transmitted disease and is easily treatable with antibiotics.             Please call in prescription metronidazole tablet 500 mg take 4 tablets at once, a total of 2 g. ------

## 2014-02-22 ENCOUNTER — Ambulatory Visit: Payer: Self-pay | Attending: Internal Medicine | Admitting: Internal Medicine

## 2014-02-22 ENCOUNTER — Encounter: Payer: Self-pay | Admitting: Internal Medicine

## 2014-02-22 VITALS — BP 131/86 | HR 68 | Temp 98.3°F | Resp 16 | Ht 63.0 in | Wt 185.0 lb

## 2014-02-22 DIAGNOSIS — A599 Trichomoniasis, unspecified: Secondary | ICD-10-CM | POA: Insufficient documentation

## 2014-02-22 DIAGNOSIS — IMO0001 Reserved for inherently not codable concepts without codable children: Secondary | ICD-10-CM | POA: Insufficient documentation

## 2014-02-22 DIAGNOSIS — Z124 Encounter for screening for malignant neoplasm of cervix: Secondary | ICD-10-CM | POA: Insufficient documentation

## 2014-02-22 DIAGNOSIS — G43909 Migraine, unspecified, not intractable, without status migrainosus: Secondary | ICD-10-CM | POA: Insufficient documentation

## 2014-02-22 DIAGNOSIS — M4802 Spinal stenosis, cervical region: Secondary | ICD-10-CM | POA: Insufficient documentation

## 2014-02-22 DIAGNOSIS — M797 Fibromyalgia: Secondary | ICD-10-CM

## 2014-02-22 MED ORDER — ACETAMINOPHEN-CODEINE #3 300-30 MG PO TABS: 1.0000 | ORAL_TABLET | ORAL | Status: DC | PRN

## 2014-02-22 MED ORDER — KETOROLAC TROMETHAMINE 30 MG/ML IJ SOLN
30.0000 mg | Freq: Once | INTRAMUSCULAR | Status: AC
  Administered 2014-02-22: 30 mg via INTRAMUSCULAR
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
## 2014-02-22 NOTE — Patient Instructions (Signed)
Fibromyalgia Fibromyalgia is a disorder that is often misunderstood. It is associated with muscular pains and tenderness that comes and goes. It is often associated with fatigue and sleep disturbances. Though it tends to be long-lasting, fibromyalgia is not life-threatening. CAUSES  The exact cause of fibromyalgia is unknown. People with certain gene types are predisposed to developing fibromyalgia and other conditions. Certain factors can play a role as triggers, such as:  Spine disorders.  Arthritis.  Severe injury (trauma) and other physical stressors.  Emotional stressors. SYMPTOMS   The main symptom is pain and stiffness in the muscles and joints, which can vary over time.  Sleep and fatigue problems. Other related symptoms may include:  Bowel and bladder problems.  Headaches.  Visual problems.  Problems with odors and noises.  Depression or mood changes.  Painful periods (dysmenorrhea).  Dryness of the skin or eyes. DIAGNOSIS  There are no specific tests for diagnosing fibromyalgia. Patients can be diagnosed accurately from the specific symptoms they have. The diagnosis is made by determining that nothing else is causing the problems. TREATMENT  There is no cure. Management includes medicines and an active, healthy lifestyle. The goal is to enhance physical fitness, decrease pain, and improve sleep. HOME CARE INSTRUCTIONS   Only take over-the-counter or prescription medicines as directed by your caregiver. Sleeping pills, tranquilizers, and pain medicines may make your problems worse.  Low-impact aerobic exercise is very important and advised for treatment. At first, it may seem to make pain worse. Gradually increasing your tolerance will overcome this feeling.  Learning relaxation techniques and how to control stress will help you. Biofeedback, visual imagery, hypnosis, muscle relaxation, yoga, and meditation are all options.  Anti-inflammatory medicines and  physical therapy may provide short-term help.  Acupuncture or massage treatments may help.  Take muscle relaxant medicines as suggested by your caregiver.  Avoid stressful situations.  Plan a healthy lifestyle. This includes your diet, sleep, rest, exercise, and friends.  Find and practice a hobby you enjoy.  Join a fibromyalgia support group for interaction, ideas, and sharing advice. This may be helpful. SEEK MEDICAL CARE IF:  You are not having good results or improvement from your treatment. FOR MORE INFORMATION  National Fibromyalgia Association: www.fmaware.org Arthritis Foundation: www.arthritis.org Document Released: 07/01/2005 Document Revised: 09/23/2011 Document Reviewed: 10/11/2009 ExitCare Patient Information 2015 [employer]. This information is not intended to replace advice given to you by your health care provider. Make sure you discuss any questions you have with your health care provider.  

## 2014-02-22 NOTE — Progress Notes (Signed)
Pt is here following up on her fibromyalgia. Pt is requesting a referral to pain management. Pt states that she is having severe migraines frequently.

## 2014-02-23 ENCOUNTER — Emergency Department (HOSPITAL_COMMUNITY)
Admission: EM | Admit: 2014-02-23 | Discharge: 2014-02-23 | Disposition: A | Discharge status: Home / Self Care | Payer: Self-pay | Attending: Emergency Medicine | Admitting: Emergency Medicine

## 2014-02-23 ENCOUNTER — Encounter (HOSPITAL_COMMUNITY): Payer: Self-pay | Admitting: Emergency Medicine

## 2014-02-23 DIAGNOSIS — G8929 Other chronic pain: Secondary | ICD-10-CM | POA: Insufficient documentation

## 2014-02-23 DIAGNOSIS — Z79899 Other long term (current) drug therapy: Secondary | ICD-10-CM | POA: Insufficient documentation

## 2014-02-23 DIAGNOSIS — IMO0001 Reserved for inherently not codable concepts without codable children: Secondary | ICD-10-CM | POA: Insufficient documentation

## 2014-02-23 DIAGNOSIS — Z8669 Personal history of other diseases of the nervous system and sense organs: Secondary | ICD-10-CM | POA: Insufficient documentation

## 2014-02-23 DIAGNOSIS — M79609 Pain in unspecified limb: Secondary | ICD-10-CM | POA: Insufficient documentation

## 2014-02-23 DIAGNOSIS — IMO0002 Reserved for concepts with insufficient information to code with codable children: Secondary | ICD-10-CM | POA: Insufficient documentation

## 2014-02-23 DIAGNOSIS — M797 Fibromyalgia: Secondary | ICD-10-CM

## 2014-02-23 DIAGNOSIS — M549 Dorsalgia, unspecified: Secondary | ICD-10-CM | POA: Insufficient documentation

## 2014-02-23 DIAGNOSIS — F17200 Nicotine dependence, unspecified, uncomplicated: Secondary | ICD-10-CM | POA: Insufficient documentation

## 2014-02-23 DIAGNOSIS — Z8619 Personal history of other infectious and parasitic diseases: Secondary | ICD-10-CM | POA: Insufficient documentation

## 2014-02-23 MED ORDER — OXYCODONE-ACETAMINOPHEN 5-325 MG PO TABS
2.0000 | ORAL_TABLET | Freq: Once | ORAL | Status: AC
Start: 2014-02-23 — End: 2014-02-23
  Administered 2014-02-23: 2 via ORAL
  Filled 2014-02-23: qty 2

## 2014-02-23 MED ORDER — DEXAMETHASONE SODIUM PHOSPHATE 10 MG/ML IJ SOLN
10.0000 mg | Freq: Once | INTRAMUSCULAR | Status: AC
  Administered 2014-02-23: 10 mg via INTRAMUSCULAR
  Filled 2014-02-23: qty 1

## 2014-02-23 MED ORDER — KETOROLAC TROMETHAMINE 30 MG/ML IJ SOLN
30.0000 mg | Freq: Once | INTRAMUSCULAR | Status: AC
  Administered 2014-02-23: 30 mg via INTRAMUSCULAR
  Filled 2014-02-23: qty 1

## 2014-02-23 NOTE — ED Notes (Signed)
Per EMS-was seen at PCP yesterday for fibromyalgia-was given injection and referral to pain management doctor-here for pain meds

## 2014-02-23 NOTE — ED Provider Notes (Signed)
CSN: [id number]     Arrival date & time 02/23/14  1503 History  This chart was scribed for non-physician practitioner, Hyman Bible, PA-C,working with Ernestina Patches, MD, by Marlowe Kays, ED Scribe. This patient was seen in room WTR8/WTR8 and the patient's care was started at 4:15 PM.  Chief Complaint  Patient presents with  . pain meds    The history is provided by the patient. No language interpreter was used.   HPI Comments:  Caitlin Valdez is a 43 y.o. female brought in by EMS, who presents to the Emergency Department complaining of severe back and leg pain secondary to fibromyalgia onset earlier today. She states she went to her PCP yesterday and received an injection for pain. She states she contacted her PCP this morning and was instructed to come here to the ED. She states she wants something for pain and is usually given a steroid injection and a prescription for Percocet.  She reports that the pain today is typical to the pain that she has with Fibromyalgia.  No injury or trauma.  She denies fever or chills.  No numbness or tingling.  No swelling or erythema of the joints.   Past Medical History  Diagnosis Date  . Abdominal pain, right lower quadrant   . Fibromyalgia   . Migraine   . Trichomonas 2012   Past Surgical History  Procedure Laterality Date  . Cesarean section    . Vein surgery    . Fracture surgery     Family History  Problem Relation Age of Onset  . Other Neg Hx   . Diabetes Mother   . Hypertension Mother   . Diabetes Father   . Hypertension Father   . Diabetes Other   . Hypertension Other    History  Substance Use Topics  . Smoking status: Current Every Day Smoker -- 0.25 packs/day    Types: Cigarettes  . Smokeless tobacco: Never Used  . Alcohol Use: No   OB History   Grav Para Term Preterm Abortions TAB SAB Ect Mult Living   2 2 2       2      Review of Systems  Constitutional: Negative for fever and chills.  Musculoskeletal: Positive for  back pain and myalgias.  All other systems reviewed and are negative.   Allergies  Review of patient's allergies indicates no known allergies.  Home Medications   Prior to Admission medications   Medication Sig Start Date End Date Taking? Authorizing Provider  DULoxetine (CYMBALTA) 60 MG capsule Take 60 mg by mouth every morning.   Yes Historical Provider, MD  ibuprofen (ADVIL,MOTRIN) 200 MG tablet Take 400 mg by mouth every 6 (six) hours as needed for headache.   Yes Historical Provider, MD  lurasidone (LATUDA) 40 MG TABS tablet Take 40 mg by mouth at bedtime.    Yes Historical Provider, MD  Menthol, Topical Analgesic, (ICY HOT EX) Apply 1 application topically daily as needed (For pain.).   Yes Historical Provider, MD  acetaminophen-codeine (TYLENOL #3) 300-30 MG per tablet Take 1 tablet by mouth every 4 (four) hours as needed. 02/22/14   Angelica Chessman, MD   Triage Vitals: BP 130/86  Pulse 70  Temp(Src) 98.4 F (36.9 C) (Oral)  Resp 18  SpO2 100%  LMP 02/08/2014 Physical Exam  Nursing note and vitals reviewed. Constitutional: She is oriented to person, place, and time. She appears well-developed and well-nourished.  HENT:  Head: Normocephalic and atraumatic.  Eyes: EOM are normal.  Neck: Normal range of motion.  Cardiovascular: Normal rate, regular rhythm and normal heart sounds.  Exam reveals no gallop and no friction rub.   No murmur heard. Pulmonary/Chest: Effort normal and breath sounds normal. No respiratory distress. She has no wheezes. She has no rales.  Musculoskeletal: Normal range of motion.  Diffuse spinal tenderness to palpation. Full ROM of upper and lower extremities. No erythema, edema, or warmth of the joints.  Neurological: She is alert and oriented to person, place, and time.  Skin: Skin is warm and dry.  Psychiatric: She has a normal mood and affect. Her behavior is normal.    ED Course  Procedures (including critical care time) DIAGNOSTIC  STUDIES: Oxygen Saturation is 100% on RA, normal by my interpretation.   COORDINATION OF CARE: 4:18 PM- Will give injection of Decadron and Toradol. Pt verbalizes understanding and agrees to plan.  4:54 PM- Pt states pain is not resolved. Will order Percocet prior to discharge and instruct pt to follow up with PCP.   Medications  dexamethasone (DECADRON) injection 10 mg (not administered)  ketorolac (TORADOL) 30 MG/ML injection 30 mg (not administered)    Labs Review Labs Reviewed - No data to display  Imaging Review No results found.   EKG Interpretation None      MDM   Final diagnoses:  None   Patient presenting today requesting pain medication for Fibromyalgia.  She was seen by PCP yesterday.  Patient instructed to follow up with PCP for pain medication for chronic pain.  Patient informed that she would not get narcotics from the ED.  Patient demonstrates understanding.  Patient stable for discharge.  Return precautions given.  I personally performed the services described in this documentation, which was scribed in my presence. The recorded information has been reviewed and is accurate.    Hyman Bible, PA-C 02/26/14 1201

## 2014-02-28 ENCOUNTER — Telehealth: Payer: Self-pay | Admitting: Internal Medicine

## 2014-02-28 ENCOUNTER — Other Ambulatory Visit: Payer: Self-pay | Admitting: *Deleted

## 2014-02-28 MED ORDER — METRONIDAZOLE 500 MG PO TABS: 500.0000 mg | ORAL_TABLET | ORAL | Status: DC

## 2014-02-28 NOTE — Telephone Encounter (Signed)
I spoke with the pt and I instructed her how to use heat and ice on her back. I also reviewed her lab results and I prescribed her an antibiotic.

## 2014-02-28 NOTE — Telephone Encounter (Signed)
Pt. Called wanting advice in regards to pain....pt. Is also wanting to know about pain clinic referral.... Pt. Was transferred to Caitlin Valdez [facility]Mar[facility] Kitchen

## 2014-03-01 NOTE — ED Provider Notes (Signed)
Medical screening examination/treatment/procedure(s) were performed by non-physician practitioner and as supervising physician I was immediately available for consultation/collaboration.    Ernestina Patches, MD 03/01/14 1501

## 2014-03-02 ENCOUNTER — Telehealth: Payer: Self-pay | Admitting: Emergency Medicine

## 2014-03-02 NOTE — Telephone Encounter (Signed)
Message copied by Ricci Barker on Wed Mar 02, 2014  4:18 PM ------      Message from: Angelica Chessman E      Created: Wed Mar 02, 2014 10:51 AM       Please inform patient that her vagina swab wet prep is still positive for bacterial vaginosis, not a sexually transmitted disease. We advised that she take another dose of metronidazole.            Please call in prescription metronidazole 500 mg tablet, take 4 tablets at once (total of 2 g) ------

## 2014-04-14 ENCOUNTER — Other Ambulatory Visit: Payer: Self-pay

## 2014-04-14 DIAGNOSIS — M4802 Spinal stenosis, cervical region: Secondary | ICD-10-CM

## 2014-04-14 MED ORDER — ACETAMINOPHEN-CODEINE #3 300-30 MG PO TABS: 1.0000 | ORAL_TABLET | ORAL | Status: DC | PRN

## 2014-04-23 ENCOUNTER — Emergency Department (HOSPITAL_COMMUNITY)
Admission: EM | Admit: 2014-04-23 | Discharge: 2014-04-23 | Disposition: A | Discharge status: Home / Self Care | Payer: Self-pay | Attending: Emergency Medicine | Admitting: Emergency Medicine

## 2014-04-23 ENCOUNTER — Encounter (HOSPITAL_COMMUNITY): Payer: Self-pay | Admitting: Emergency Medicine

## 2014-04-23 ENCOUNTER — Emergency Department (HOSPITAL_COMMUNITY): Payer: Self-pay

## 2014-04-23 DIAGNOSIS — Z720 Tobacco use: Secondary | ICD-10-CM | POA: Insufficient documentation

## 2014-04-23 DIAGNOSIS — W010XXA Fall on same level from slipping, tripping and stumbling without subsequent striking against object, initial encounter: Secondary | ICD-10-CM | POA: Insufficient documentation

## 2014-04-23 DIAGNOSIS — Z8619 Personal history of other infectious and parasitic diseases: Secondary | ICD-10-CM | POA: Insufficient documentation

## 2014-04-23 DIAGNOSIS — Z79899 Other long term (current) drug therapy: Secondary | ICD-10-CM | POA: Insufficient documentation

## 2014-04-23 DIAGNOSIS — Y9389 Activity, other specified: Secondary | ICD-10-CM | POA: Insufficient documentation

## 2014-04-23 DIAGNOSIS — S8991XA Unspecified injury of right lower leg, initial encounter: Secondary | ICD-10-CM | POA: Insufficient documentation

## 2014-04-23 DIAGNOSIS — S63501A Unspecified sprain of right wrist, initial encounter: Secondary | ICD-10-CM | POA: Insufficient documentation

## 2014-04-23 DIAGNOSIS — Y92192 Bathroom in other specified residential institution as the place of occurrence of the external cause: Secondary | ICD-10-CM | POA: Insufficient documentation

## 2014-04-23 DIAGNOSIS — W19XXXA Unspecified fall, initial encounter: Secondary | ICD-10-CM

## 2014-04-23 DIAGNOSIS — Z8679 Personal history of other diseases of the circulatory system: Secondary | ICD-10-CM | POA: Insufficient documentation

## 2014-04-23 DIAGNOSIS — M797 Fibromyalgia: Secondary | ICD-10-CM | POA: Insufficient documentation

## 2014-04-23 DIAGNOSIS — Z792 Long term (current) use of antibiotics: Secondary | ICD-10-CM | POA: Insufficient documentation

## 2014-04-23 MED ORDER — HYDROCODONE-ACETAMINOPHEN 5-325 MG PO TABS: 1.0000 | ORAL_TABLET | Freq: Four times a day (QID) | ORAL | Status: DC | PRN

## 2014-04-23 NOTE — ED Notes (Addendum)
Patient was cleaning the bathroom and slipped and fell. Did not hit head and did not lose consciousness. Broke fall with right arm. Experiencing severe pain in right forearm and right wrist (noticeable swelling). Also c/o right knee pain with bruising. No obvious deformity. Has not taken any medications. Limited ROM with right wrist. No other complaints/concerns.

## 2014-04-23 NOTE — ED Provider Notes (Signed)
CSN: [id number]     Arrival date & time 04/23/14  1817 History  This chart was scribed for non-physician practitioner working with Orlie Dakin, MD by Mercy Moore, ED Scribe. This patient was seen in room [place] and the patient's care was started at 6:34 PM.   Chief Complaint  Patient presents with  . Fall  . Arm Pain    right   The history is provided by the patient. No language interpreter was used.   HPI Comments: Caitlin Valdez is a 43 y.o. female with PMHx of fibromyalgia who presents to the Emergency Department complaining of right wrist and hand pain due to recent fall two hours ago. Patient reports slipping and falling while cleaning her bathtub. Patient landed on flexed right wrist and right knee. Patient denies head injury or loss of consciousness.  Patient is now complaining of right wrist swelling and pain with radiation in to her right hand. Patient's pain is exacerbated with movement and applied pressure. Patient reports bruising and some pain to her right knee which she is less concerned about.  Patient has not taken any medication, but reports treatment with ice.  Patient reports previous left wrist injury years ago.    Past Medical History  Diagnosis Date  . Abdominal pain, right lower quadrant   . Fibromyalgia   . Migraine   . Trichomonas 2012   Past Surgical History  Procedure Laterality Date  . Cesarean section    . Vein surgery    . Fracture surgery     Family History  Problem Relation Age of Onset  . Other Neg Hx   . Diabetes Mother   . Hypertension Mother   . Diabetes Father   . Hypertension Father   . Diabetes Other   . Hypertension Other    History  Substance Use Topics  . Smoking status: Current Every Day Smoker -- 0.25 packs/day    Types: Cigarettes  . Smokeless tobacco: Never Used  . Alcohol Use: No   OB History   Grav Para Term Preterm Abortions TAB SAB Ect Mult Living   2 2 2       2      Review of Systems    Allergies   Review of patient's allergies indicates no known allergies.  Home Medications   Prior to Admission medications   Medication Sig Start Date End Date Taking? Authorizing Provider  acetaminophen-codeine (TYLENOL #3) 300-30 MG per tablet Take 1 tablet by mouth every 4 (four) hours as needed. 04/14/14   Tresa Garter, MD  DULoxetine (CYMBALTA) 60 MG capsule Take 60 mg by mouth every morning.    Historical Provider, MD  ibuprofen (ADVIL,MOTRIN) 200 MG tablet Take 400 mg by mouth every 6 (six) hours as needed for headache.    Historical Provider, MD  lurasidone (LATUDA) 40 MG TABS tablet Take 40 mg by mouth at bedtime.     Historical Provider, MD  Menthol, Topical Analgesic, (ICY HOT EX) Apply 1 application topically daily as needed (For pain.).    Historical Provider, MD  metroNIDAZOLE (FLAGYL) 500 MG tablet Take 1 tablet (500 mg total) by mouth as directed. 02/28/14   Tresa Garter, MD   Triage Vitals: BP 97/69  Pulse 93  Temp(Src) 98.7 F (37.1 C) (Oral)  Resp 18  SpO2 99%  Physical Exam  Nursing note and vitals reviewed. Constitutional: She is oriented to person, place, and time. She appears well-developed and well-nourished. No distress.  HENT:  Head: Normocephalic  and atraumatic.  Eyes: EOM are normal.  Neck: Neck supple.  Cardiovascular: Normal rate.   Pulmonary/Chest: Effort normal. No respiratory distress.  Musculoskeletal: Normal range of motion. She exhibits tenderness.  Right wrist: obvious swelling on lateral aspect of right wrist. Unable to pronate or supinate. Tenderness along 4th and 5th metacarpal.  Right knee: No obvious swelling. Full ROM.   Neurological: She is alert and oriented to person, place, and time.  Skin: Skin is warm and dry.  Psychiatric: She has a normal mood and affect. Her behavior is normal.    ED Course  Procedures (including critical care time)  COORDINATION OF CARE: 6:43 PM- Discussed treatment plan with patient at bedside and  patient agreed to plan.   Labs Review Labs Reviewed - No data to display  Imaging Review Dg Wrist Complete Right  04/23/2014   CLINICAL DATA:  Patient slipped and fell in the bathroom with pain in the right arm.  EXAM: RIGHT WRIST - COMPLETE 3+ VIEW  COMPARISON:  None.  FINDINGS: There is no evidence of fracture or dislocation. There is no evidence of arthropathy or other focal bone abnormality. Soft tissues are unremarkable.  IMPRESSION: No acute fracture dislocation.   Electronically Signed   By: Abelardo Diesel M.D.   On: 04/23/2014 19:08   Dg Hand Complete Right  04/23/2014   CLINICAL DATA:  Slipped and fell cleaning bathroom. Right hand and wrist pain and swelling.  EXAM: RIGHT HAND - COMPLETE 3+ VIEW  COMPARISON:  None.  FINDINGS: There is no evidence of fracture or dislocation. There is no evidence of arthropathy or other focal bone abnormality. Soft tissues are unremarkable.  IMPRESSION: Negative.   Electronically Signed   By: Earle Gell M.D.   On: 04/23/2014 19:08     EKG Interpretation None      MDM   Final diagnoses:  Wrist sprain, right, initial encounter  Fall, initial encounter    No bony abnormality noted. Pt splinted for comfort.given ortho follow up as needed  I personally performed the services described in this documentation, which was scribed in my presence. The recorded information has been reviewed and is accurate.    Glendell Docker, NP 04/23/14 1918

## 2014-04-23 NOTE — Discharge Instructions (Signed)
Fall Prevention and Home Safety °Falls cause injuries and can affect all age groups. It is possible to prevent falls.  °HOW TO PREVENT FALLS °· Wear shoes with rubber soles that do not have an opening for your toes. °· Keep the inside and outside of your house well lit. °· Use night lights throughout your home. °· Remove clutter from floors. °· Clean up floor spills. °· Remove throw rugs or fasten them to the floor with carpet tape. °· Do not place electrical cords across pathways. °· Put grab bars by your tub, shower, and toilet. Do not use towel bars as grab bars. °· Put handrails on both sides of the stairway. Fix loose handrails. °· Do not climb on stools or stepladders, if possible. °· Do not wax your floors. °· Repair uneven or unsafe sidewalks, walkways, or stairs. °· Keep items you use a lot within reach. °· Be aware of pets. °· Keep emergency numbers next to the telephone. °· Put smoke detectors in your home and near bedrooms. °Ask your doctor what other things you can do to prevent falls. °Document Released: 04/27/2009 Document Revised: 12/31/2011 Document Reviewed: 10/01/2011 °ExitCare® Patient Information ©2015 [employer]. This information is not intended to replace advice given to you by your health care provider. Make sure you discuss any questions you have with your health care provider. ° °

## 2014-04-23 NOTE — ED Notes (Signed)
Ortho tech at bedside to splint wrist and explain splint care. AVS explained in detail. Knows not to take extra tylenol and not to drink alcohol, drive, operate heavy machinery with prescribed medications.

## 2014-04-24 NOTE — ED Provider Notes (Signed)
Medical screening examination/treatment/procedure(s) were performed by non-physician practitioner and as supervising physician I was immediately available for consultation/collaboration.   EKG Interpretation None       Orlie Dakin, MD 04/24/14 0006

## 2014-05-02 ENCOUNTER — Other Ambulatory Visit: Payer: Self-pay | Admitting: *Deleted

## 2014-05-02 DIAGNOSIS — M4802 Spinal stenosis, cervical region: Secondary | ICD-10-CM

## 2014-05-02 MED ORDER — ACETAMINOPHEN-CODEINE #3 300-30 MG PO TABS
1.0000 | ORAL_TABLET | Freq: Three times a day (TID) | ORAL | Status: DC | PRN
Start: 2014-05-02 — End: 2014-05-09

## 2014-05-09 ENCOUNTER — Encounter: Payer: Self-pay | Admitting: Internal Medicine

## 2014-05-09 ENCOUNTER — Ambulatory Visit: Payer: Self-pay | Attending: Internal Medicine | Admitting: Internal Medicine

## 2014-05-09 VITALS — BP 139/90 | HR 98 | Temp 98.3°F | Resp 16 | Ht 63.0 in | Wt 186.0 lb

## 2014-05-09 DIAGNOSIS — N920 Excessive and frequent menstruation with regular cycle: Secondary | ICD-10-CM

## 2014-05-09 DIAGNOSIS — M797 Fibromyalgia: Secondary | ICD-10-CM

## 2014-05-09 DIAGNOSIS — M4802 Spinal stenosis, cervical region: Secondary | ICD-10-CM

## 2014-05-09 DIAGNOSIS — Z202 Contact with and (suspected) exposure to infections with a predominantly sexual mode of transmission: Secondary | ICD-10-CM

## 2014-05-09 MED ORDER — ACETAMINOPHEN-CODEINE #3 300-30 MG PO TABS: 1.0000 | ORAL_TABLET | Freq: Three times a day (TID) | ORAL | Status: DC | PRN

## 2014-05-09 NOTE — Progress Notes (Signed)
Caitlin Valdez, is a 43 y.o. female  ZOX:[id number]  WJX:[id number]  DOB - 30-Dec-1970  Chief Complaint  Patient presents with  . Follow-up        Subjective:   Caitlin Valdez is a 43 y.o. female here today for a follow up visit. Patient has history of fibromyalgia here today in the clinic for follow-up. Patient was recently seen in the ED for what she described as fibromyalgia with pains mostly in the hands and wrist joint. Patient has possible spinal stenosis of the cervical spine. She also recently was exposed to STD control and unprotected sexual intercourse, she would like to be tested because she thinks it might be infected.Patient has No headache, No chest pain, No abdominal pain - No Nausea, No new weakness tingling or numbness, No Cough - SOB.  Problem  Std Exposure  Polymenorrhea    ALLERGIES: No Known Allergies  PAST MEDICAL HISTORY: Past Medical History  Diagnosis Date  . Abdominal pain, right lower quadrant   . Fibromyalgia   . Migraine   . Trichomonas 2012    MEDICATIONS AT HOME: Prior to Admission medications   Medication Sig Start Date End Date Taking? Authorizing Provider  acetaminophen-codeine (TYLENOL #3) 300-30 MG per tablet Take 1 tablet by mouth every 8 (eight) hours as needed. 05/09/14  Yes Tresa Garter, MD  DULoxetine (CYMBALTA) 60 MG capsule Take 60 mg by mouth every morning.   Yes Historical Provider, MD  HYDROcodone-acetaminophen (NORCO/VICODIN) 5-325 MG per tablet Take 1-2 tablets by mouth every 6 (six) hours as needed. 04/23/14  Yes Glendell Docker, NP  ibuprofen (ADVIL,MOTRIN) 200 MG tablet Take 400 mg by mouth every 6 (six) hours as needed for headache.   Yes Historical Provider, MD  lurasidone (LATUDA) 40 MG TABS tablet Take 40 mg by mouth at bedtime.    Yes Historical Provider, MD  Menthol, Topical Analgesic, (ICY HOT EX) Apply 1 application topically daily as needed (For pain.).   Yes Historical Provider, MD  metroNIDAZOLE (FLAGYL) 500 MG  tablet Take 1 tablet (500 mg total) by mouth as directed. 02/28/14   Tresa Garter, MD     Objective:   Filed Vitals:   05/09/14 1718  BP: 139/90  Pulse: 98  Temp: 98.3 F (36.8 C)  TempSrc: Oral  Resp: 16  Height: 5\' 3"  (1.6 m)  Weight: 186 lb (84.369 kg)  SpO2: 98%    Exam General appearance : Awake, alert, not in any distress. Speech Clear. Not toxic looking HEENT: Atraumatic and Normocephalic, pupils equally reactive to light and accomodation Neck: supple, no JVD. No cervical lymphadenopathy.  Chest:Good air entry bilaterally, no added sounds  CVS: S1 S2 regular, no murmurs.  Abdomen: Bowel sounds present, Non tender and not distended with no gaurding, rigidity or rebound. Extremities: B/L Lower Ext shows no edema, both legs are warm to touch Neurology: Awake alert, and oriented X 3, CN II-XII intact, Non focal Pelvic Exam: Cervix normal in appearance, external genitalia normal, no adnexal masses or tenderness, no cervical motion tenderness, rectovaginal septum normal, uterus normal size, shape, and consistency and vagina normal without discharge    Data Review Lab Results  Component Value Date   HGBA1C 5.2 12/23/2013     Assessment & Plan   1. Spinal stenosis in cervical region  - acetaminophen-codeine (TYLENOL #3) 300-30 MG per tablet; Take 1 tablet by mouth every 8 (eight) hours as needed.  Dispense: 60 tablet; Refill: 0  2. STD exposure  - Cervicovaginal  ancillary only - HIV antibody (with reflex) - Urinalysis, Complete   Return in about 6 months (around 11/08/2014), or if symptoms worsen or fail to improve, for Follow up Pain and comorbidities.  The patient was given clear instructions to go to ER or return to medical center if symptoms don't improve, worsen or new problems develop. The patient verbalized understanding. The patient was told to call to get lab results if they haven't heard anything in the next week.   This note has been created with  Surveyor, quantity. Any transcriptional errors are unintentional.    Angelica Chessman, MD, [place], [place], [place] and [place], [place]   05/09/2014, 6:03 PM

## 2014-05-09 NOTE — Patient Instructions (Signed)
Fibromyalgia Fibromyalgia is a disorder that is often misunderstood. It is associated with muscular pains and tenderness that comes and goes. It is often associated with fatigue and sleep disturbances. Though it tends to be long-lasting, fibromyalgia is not life-threatening. CAUSES  The exact cause of fibromyalgia is unknown. People with certain gene types are predisposed to developing fibromyalgia and other conditions. Certain factors can play a role as triggers, such as:  Spine disorders.  Arthritis.  Severe injury (trauma) and other physical stressors.  Emotional stressors. SYMPTOMS   The main symptom is pain and stiffness in the muscles and joints, which can vary over time.  Sleep and fatigue problems. Other related symptoms may include:  Bowel and bladder problems.  Headaches.  Visual problems.  Problems with odors and noises.  Depression or mood changes.  Painful periods (dysmenorrhea).  Dryness of the skin or eyes. DIAGNOSIS  There are no specific tests for diagnosing fibromyalgia. Patients can be diagnosed accurately from the specific symptoms they have. The diagnosis is made by determining that nothing else is causing the problems. TREATMENT  There is no cure. Management includes medicines and an active, healthy lifestyle. The goal is to enhance physical fitness, decrease pain, and improve sleep. HOME CARE INSTRUCTIONS   Only take over-the-counter or prescription medicines as directed by your caregiver. Sleeping pills, tranquilizers, and pain medicines may make your problems worse.  Low-impact aerobic exercise is very important and advised for treatment. At first, it may seem to make pain worse. Gradually increasing your tolerance will overcome this feeling.  Learning relaxation techniques and how to control stress will help you. Biofeedback, visual imagery, hypnosis, muscle relaxation, yoga, and meditation are all options.  Anti-inflammatory medicines and  physical therapy may provide short-term help.  Acupuncture or massage treatments may help.  Take muscle relaxant medicines as suggested by your caregiver.  Avoid stressful situations.  Plan a healthy lifestyle. This includes your diet, sleep, rest, exercise, and friends.  Find and practice a hobby you enjoy.  Join a fibromyalgia support group for interaction, ideas, and sharing advice. This may be helpful. SEEK MEDICAL CARE IF:  You are not having good results or improvement from your treatment. FOR MORE INFORMATION  National Fibromyalgia Association: www.fmaware.org Arthritis Foundation: www.arthritis.org Document Released: 07/01/2005 Document Revised: 09/23/2011 Document Reviewed: 10/11/2009 ExitCare Patient Information 2015 [employer]. This information is not intended to replace advice given to you by your health care provider. Make sure you discuss any questions you have with your health care provider.  

## 2014-05-09 NOTE — Progress Notes (Signed)
Pt is here following up on her fibromyalgia.  Pt states that she has pain in her mouth while she eats. She thinks that she may have an infection.

## 2014-05-10 ENCOUNTER — Ambulatory Visit: Payer: Self-pay | Attending: Internal Medicine | Admitting: Internal Medicine

## 2014-05-10 DIAGNOSIS — M797 Fibromyalgia: Secondary | ICD-10-CM

## 2014-05-10 LAB — URINALYSIS, COMPLETE
Bacteria, UA: NONE SEEN
Bilirubin Urine: NEGATIVE
Casts: NONE SEEN
Crystals: NONE SEEN
Glucose, UA: NEGATIVE mg/dL
Ketones, ur: NEGATIVE mg/dL
Leukocytes, UA: NEGATIVE
Nitrite: NEGATIVE
Protein, ur: NEGATIVE mg/dL
Specific Gravity, Urine: 1.019 (ref 1.005–1.030)
Urobilinogen, UA: 1 mg/dL (ref 0.0–1.0)
pH: 5.5 (ref 5.0–8.0)

## 2014-05-10 LAB — HIV ANTIBODY (ROUTINE TESTING W REFLEX): HIV 1&2 Ab, 4th Generation: NONREACTIVE

## 2014-05-10 MED ORDER — CYCLOBENZAPRINE HCL 10 MG PO TABS: 10.0000 mg | ORAL_TABLET | Freq: Three times a day (TID) | ORAL | Status: DC | PRN

## 2014-05-10 MED ORDER — GABAPENTIN 300 MG PO CAPS: 300.0000 mg | ORAL_CAPSULE | Freq: Three times a day (TID) | ORAL | Status: DC

## 2014-05-10 MED ORDER — KETOROLAC TROMETHAMINE 30 MG/ML IJ SOLN
60.0000 mg | Freq: Once | INTRAMUSCULAR | Status: AC
  Administered 2014-05-10: 60 mg via INTRAMUSCULAR

## 2014-05-11 ENCOUNTER — Encounter: Payer: Self-pay | Admitting: Obstetrics & Gynecology

## 2014-05-11 LAB — CERVICOVAGINAL ANCILLARY ONLY
Chlamydia: NEGATIVE
Neisseria Gonorrhea: NEGATIVE
Wet Prep (BD Affirm): NEGATIVE
Wet Prep (BD Affirm): NEGATIVE
Wet Prep (BD Affirm): POSITIVE — AB

## 2014-05-12 ENCOUNTER — Telehealth: Payer: Self-pay | Admitting: Internal Medicine

## 2014-05-12 NOTE — Telephone Encounter (Signed)
Pt. Called to get the results of blood work. Please f/u with.

## 2014-05-13 NOTE — Telephone Encounter (Signed)
Pt calling back regarding lab results. Please f/u with pt.

## 2014-05-16 ENCOUNTER — Ambulatory Visit (HOSPITAL_COMMUNITY): Payer: Self-pay

## 2014-05-16 ENCOUNTER — Encounter: Payer: Self-pay | Admitting: Internal Medicine

## 2014-05-17 ENCOUNTER — Telehealth: Payer: Self-pay | Admitting: Emergency Medicine

## 2014-05-17 MED ORDER — METRONIDAZOLE 500 MG PO TABS: 500.0000 mg | ORAL_TABLET | Freq: Two times a day (BID) | ORAL | Status: DC

## 2014-05-17 NOTE — Telephone Encounter (Signed)
Pt given culture results/wet prep Instructed pt to take Flagyl 500 mg tab BID x7 dys Medication e-scribed @ [facility] Informed pt to refrain from alcohol while taking medication

## 2014-05-20 ENCOUNTER — Ambulatory Visit: Payer: Self-pay

## 2014-05-30 ENCOUNTER — Telehealth: Payer: Self-pay | Admitting: Emergency Medicine

## 2014-05-30 NOTE — Telephone Encounter (Signed)
Pt given results  

## 2014-05-30 NOTE — Telephone Encounter (Signed)
-----   Message from Tresa Garter, MD sent at 05/22/2014  2:41 PM EST ----- Please inform patient that her HIV test is negative, her other result shows positive bacteria vaginosis which has been treated with Flagyl.

## 2014-06-03 ENCOUNTER — Ambulatory Visit (HOSPITAL_COMMUNITY)
Admission: RE | Admit: 2014-06-03 | Discharge: 2014-06-03 | Disposition: A | Discharge status: Home / Self Care | Payer: Self-pay | Source: Ambulatory Visit | Attending: Internal Medicine | Admitting: Internal Medicine

## 2014-06-03 DIAGNOSIS — N920 Excessive and frequent menstruation with regular cycle: Secondary | ICD-10-CM | POA: Insufficient documentation

## 2014-06-03 DIAGNOSIS — D259 Leiomyoma of uterus, unspecified: Secondary | ICD-10-CM | POA: Insufficient documentation

## 2014-06-03 DIAGNOSIS — N8320 Unspecified ovarian cysts: Secondary | ICD-10-CM | POA: Insufficient documentation

## 2014-06-13 ENCOUNTER — Ambulatory Visit: Payer: Self-pay

## 2014-06-15 ENCOUNTER — Telehealth: Payer: Self-pay | Admitting: Internal Medicine

## 2014-06-15 NOTE — Telephone Encounter (Signed)
Patient calling to speak to nurse in regards to lab results. Patient states she viewed her lab results on MyChart, but wants a nurse to go over them for her. Please follow up.

## 2014-06-21 ENCOUNTER — Telehealth: Payer: Self-pay | Admitting: Emergency Medicine

## 2014-06-21 DIAGNOSIS — D259 Leiomyoma of uterus, unspecified: Secondary | ICD-10-CM

## 2014-06-21 NOTE — Telephone Encounter (Signed)
Pt given ultrasound results with further instructions to f/u with GYN GYN referral placed

## 2014-06-21 NOTE — Telephone Encounter (Signed)
-----   Message from Tresa Garter, MD sent at 06/15/2014 12:40 PM EST ----- Please inform patient that her pelvic ultrasound shows uterine fibroid and thickened uterine wall. We will refer her to gynecologist for possible endometrial biopsy and further management.  Please place a referral to gynecologist for uterine fibroids, endometrial thickening and polymenorrhagia.

## 2014-06-27 ENCOUNTER — Ambulatory Visit: Payer: Self-pay

## 2014-06-29 ENCOUNTER — Ambulatory Visit (INDEPENDENT_AMBULATORY_CARE_PROVIDER_SITE_OTHER): Payer: Self-pay | Admitting: Obstetrics & Gynecology

## 2014-06-29 ENCOUNTER — Other Ambulatory Visit (HOSPITAL_COMMUNITY)
Admission: RE | Admit: 2014-06-29 | Discharge: 2014-06-29 | Disposition: A | Discharge status: Home / Self Care | Payer: Self-pay | Source: Ambulatory Visit | Attending: Obstetrics & Gynecology | Admitting: Obstetrics & Gynecology

## 2014-06-29 ENCOUNTER — Encounter: Payer: Self-pay | Admitting: Obstetrics & Gynecology

## 2014-06-29 VITALS — BP 122/83 | HR 77 | Temp 97.7°F | Ht 63.0 in | Wt 185.8 lb

## 2014-06-29 DIAGNOSIS — D259 Leiomyoma of uterus, unspecified: Secondary | ICD-10-CM

## 2014-06-29 DIAGNOSIS — N938 Other specified abnormal uterine and vaginal bleeding: Secondary | ICD-10-CM

## 2014-06-29 DIAGNOSIS — Z0000 Encounter for general adult medical examination without abnormal findings: Secondary | ICD-10-CM

## 2014-06-29 DIAGNOSIS — Z01812 Encounter for preprocedural laboratory examination: Secondary | ICD-10-CM

## 2014-06-29 DIAGNOSIS — N939 Abnormal uterine and vaginal bleeding, unspecified: Secondary | ICD-10-CM | POA: Insufficient documentation

## 2014-06-29 DIAGNOSIS — E669 Obesity, unspecified: Secondary | ICD-10-CM

## 2014-06-29 LAB — POCT PREGNANCY, URINE: Preg Test, Ur: NEGATIVE

## 2014-06-29 NOTE — Addendum Note (Signed)
Addended by: Christiana Pellant A on: 06/29/2014 04:12 PM   Modules accepted: Orders

## 2014-06-29 NOTE — Progress Notes (Signed)
   Subjective:    Patient ID: Caitlin Valdez, female    DOB: 1970-08-30, 43 y.o.   MRN: [id number]  HPI  43 yo D AA P2 (56 and 64 yo kids) here today to discuss that she is having periods 2 times per month, lasting 7 days each, has been going on for 5 months. Unusually painful (RLQ) as well. Dyspareunia for the last 6 weeks. Monogamous for "couple of months".  Her u/s showed 1.6 cm fibroid near to the endometrium. Also thickened endometrium.  Review of Systems Both of her kids were born via C/S. She uses condoms for birth control.    Objective:   Physical Exam   UPT negative, consent signed, time out done Cervix prepped with betadine and grasped with a single tooth tenaculum Uterus sounded to 9 cm Pipelle used for 3 passes with a large amount of tissue obtained. She tolerated the procedure well.        Assessment & Plan:  Preventative care- schedule mammogram DUB- await EMBX results RTC 2 weeks

## 2014-06-29 NOTE — Progress Notes (Signed)
Patient here today for abnormal uterine bleeding and fibroids. U/S done on 06/03/14. Set up for endometrial biopsy- if necessary. Upt obtained and negative. Patient also c/o of vaginal irritation due to having found a condom had been stuck inside her vagina for 6 days.

## 2014-06-30 ENCOUNTER — Telehealth: Payer: Self-pay

## 2014-06-30 LAB — WET PREP, GENITAL
Trich, Wet Prep: NONE SEEN
WBC, Wet Prep HPF POC: NONE SEEN
Yeast Wet Prep HPF POC: NONE SEEN

## 2014-06-30 NOTE — Telephone Encounter (Addendum)
Called pt to inform her that she needed to come in to fill out the mammogram scholarship for the scheduling of her screening.  Pt stated that she would be able to come in tomorrow on 06/30/14 to fill out scholarship.  I informed pt that our office closes at noon.  Pt stated that she would be around the area so she would be able to come in a fill out scholarship. I stated understanding.  Pt did not have any other questions.

## 2014-07-01 ENCOUNTER — Encounter: Payer: Self-pay | Admitting: *Deleted

## 2014-07-27 ENCOUNTER — Ambulatory Visit (INDEPENDENT_AMBULATORY_CARE_PROVIDER_SITE_OTHER): Payer: Self-pay | Admitting: Obstetrics & Gynecology

## 2014-07-27 VITALS — BP 111/67 | HR 87 | Temp 98.2°F | Wt 185.3 lb

## 2014-07-27 DIAGNOSIS — Z0000 Encounter for general adult medical examination without abnormal findings: Secondary | ICD-10-CM

## 2014-07-27 DIAGNOSIS — N938 Other specified abnormal uterine and vaginal bleeding: Secondary | ICD-10-CM

## 2014-07-27 DIAGNOSIS — J029 Acute pharyngitis, unspecified: Secondary | ICD-10-CM

## 2014-07-27 MED ORDER — METRONIDAZOLE 500 MG PO TABS: 500.0000 mg | ORAL_TABLET | Freq: Two times a day (BID) | ORAL | Status: DC

## 2014-07-27 NOTE — Progress Notes (Signed)
   Subjective:    Patient ID: Caitlin Valdez, female    DOB: 01-29-1971, 44 y.o.   MRN: [id number]  HPI  44 yo AA lady here to go over her EMBX results. She was having 2 periods per month and her EMBX was normal. She uses condoms for contraception even though she has had a tubal ligation. Smoker.   She also complains of recurrent BV.  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    Objective:   Physical Exam  Heart- rrr Lungs- CTAB Abd- benign      Assessment & Plan:  DUB- cannot use OCPs. She declines trial of provera but is interested in ablation. I will send [name] an email to schedule Novasure. Recurrent BV- treat with flagyl. Several refills given.  Rec a probiotic

## 2014-07-28 ENCOUNTER — Telehealth: Payer: Self-pay

## 2014-07-28 NOTE — Telephone Encounter (Signed)
Patient called stating she is still in pain and Dr. Hulan Fray can send RX [facility]fen [facility]. Per chart review, patient seen yesterday. No mention of prescribing pain meds. Called patient and left message stating we recommend OTC ibuprofen--may take up to 800mg  Ibuprofen every 6 hours, call clinic with any further questions.

## 2014-07-29 LAB — CULTURE, GROUP A STREP: Organism ID, Bacteria: NORMAL

## 2014-08-02 ENCOUNTER — Ambulatory Visit (HOSPITAL_COMMUNITY)
Admission: RE | Admit: 2014-08-02 | Discharge: 2014-08-02 | Disposition: A | Discharge status: Home / Self Care | Payer: Self-pay | Source: Ambulatory Visit | Attending: Obstetrics & Gynecology | Admitting: Obstetrics & Gynecology

## 2014-08-02 ENCOUNTER — Encounter (HOSPITAL_COMMUNITY)
Admission: RE | Admit: 2014-08-02 | Discharge: 2014-08-02 | Disposition: A | Discharge status: Home / Self Care | Payer: Self-pay | Source: Ambulatory Visit | Attending: Obstetrics & Gynecology | Admitting: Obstetrics & Gynecology

## 2014-08-02 ENCOUNTER — Encounter (HOSPITAL_COMMUNITY): Payer: Self-pay

## 2014-08-02 ENCOUNTER — Other Ambulatory Visit: Payer: Self-pay | Admitting: Obstetrics & Gynecology

## 2014-08-02 DIAGNOSIS — Z1231 Encounter for screening mammogram for malignant neoplasm of breast: Secondary | ICD-10-CM

## 2014-08-02 DIAGNOSIS — N938 Other specified abnormal uterine and vaginal bleeding: Secondary | ICD-10-CM | POA: Insufficient documentation

## 2014-08-02 DIAGNOSIS — Z01818 Encounter for other preprocedural examination: Secondary | ICD-10-CM | POA: Insufficient documentation

## 2014-08-02 DIAGNOSIS — Z0000 Encounter for general adult medical examination without abnormal findings: Secondary | ICD-10-CM

## 2014-08-02 HISTORY — DX: Depression, unspecified: F32.A

## 2014-08-02 HISTORY — DX: Peripheral vascular disease, unspecified: I73.9

## 2014-08-02 HISTORY — DX: Nausea with vomiting, unspecified: R11.2

## 2014-08-02 HISTORY — DX: Major depressive disorder, single episode, unspecified: F32.9

## 2014-08-02 HISTORY — DX: Other specified postprocedural states: Z98.890

## 2014-08-02 HISTORY — DX: Cerebral infarction, unspecified: I63.9

## 2014-08-02 HISTORY — DX: Gastro-esophageal reflux disease without esophagitis: K21.9

## 2014-08-02 HISTORY — DX: Anxiety disorder, unspecified: F41.9

## 2014-08-02 LAB — CBC
HCT: 40.9 % (ref 36.0–46.0)
Hemoglobin: 13.9 g/dL (ref 12.0–15.0)
MCH: 32.6 pg (ref 26.0–34.0)
MCHC: 34 g/dL (ref 30.0–36.0)
MCV: 95.8 fL (ref 78.0–100.0)
Platelets: 277 10*3/uL (ref 150–400)
RBC: 4.27 MIL/uL (ref 3.87–5.11)
RDW: 12.6 % (ref 11.5–15.5)
WBC: 9 10*3/uL (ref 4.0–10.5)

## 2014-08-02 NOTE — Patient Instructions (Addendum)
Your procedure is scheduled on: Monday, August 08, 2014  Enter through the Main Entrance of [facility] at: 10:00 am  Pick up the phone at the desk and dial [phone].  Call this number if you have problems the morning of surgery: [phone].  Remember: Do NOT eat food: AFTER MIDNIGHT SUNDAY Do NOT drink clear liquids after:  AFTER MIDNIGHT SUNDAY Take these medicines the morning of surgery with a SIP OF WATER: CYMBALTA  Do NOT wear jewelry (body piercing), metal hair clips/bobby pins, make-up, or nail polish. Do NOT wear lotions, powders, or perfumes.  You may wear deoderant. Do NOT shave for 48 hours prior to surgery. Do NOT bring valuables to the hospital. Contacts, dentures, or bridgework may not be worn into surgery.  Have a responsible adult drive you home and stay with you for 24 hours after your procedure

## 2014-08-03 ENCOUNTER — Ambulatory Visit: Payer: Self-pay

## 2014-08-04 ENCOUNTER — Other Ambulatory Visit: Payer: Self-pay | Admitting: Internal Medicine

## 2014-08-04 ENCOUNTER — Ambulatory Visit: Payer: Self-pay | Admitting: Internal Medicine

## 2014-08-08 ENCOUNTER — Encounter (HOSPITAL_COMMUNITY): Payer: Self-pay | Admitting: Emergency Medicine

## 2014-08-08 ENCOUNTER — Emergency Department (HOSPITAL_COMMUNITY)
Admission: EM | Admit: 2014-08-08 | Discharge: 2014-08-08 | Disposition: A | Discharge status: Home / Self Care | Payer: Self-pay | Attending: Emergency Medicine | Admitting: Emergency Medicine

## 2014-08-08 ENCOUNTER — Ambulatory Visit (HOSPITAL_COMMUNITY): Payer: Self-pay | Admitting: Anesthesiology

## 2014-08-08 ENCOUNTER — Ambulatory Visit (HOSPITAL_COMMUNITY)
Admission: RE | Admit: 2014-08-08 | Discharge: 2014-08-08 | Disposition: A | Discharge status: Home / Self Care | Payer: Self-pay | Source: Ambulatory Visit | Attending: Obstetrics & Gynecology | Admitting: Obstetrics & Gynecology

## 2014-08-08 ENCOUNTER — Encounter (HOSPITAL_COMMUNITY)
Admission: RE | Disposition: A | Discharge status: Home / Self Care | Payer: Self-pay | Source: Ambulatory Visit | Attending: Obstetrics & Gynecology

## 2014-08-08 ENCOUNTER — Encounter (HOSPITAL_COMMUNITY): Payer: Self-pay | Admitting: Obstetrics & Gynecology

## 2014-08-08 DIAGNOSIS — G43909 Migraine, unspecified, not intractable, without status migrainosus: Secondary | ICD-10-CM | POA: Insufficient documentation

## 2014-08-08 DIAGNOSIS — Z6833 Body mass index (BMI) 33.0-33.9, adult: Secondary | ICD-10-CM | POA: Insufficient documentation

## 2014-08-08 DIAGNOSIS — F329 Major depressive disorder, single episode, unspecified: Secondary | ICD-10-CM | POA: Insufficient documentation

## 2014-08-08 DIAGNOSIS — Z8719 Personal history of other diseases of the digestive system: Secondary | ICD-10-CM | POA: Insufficient documentation

## 2014-08-08 DIAGNOSIS — M797 Fibromyalgia: Secondary | ICD-10-CM | POA: Insufficient documentation

## 2014-08-08 DIAGNOSIS — N938 Other specified abnormal uterine and vaginal bleeding: Secondary | ICD-10-CM | POA: Diagnosis present

## 2014-08-08 DIAGNOSIS — Z720 Tobacco use: Secondary | ICD-10-CM | POA: Insufficient documentation

## 2014-08-08 DIAGNOSIS — Z8673 Personal history of transient ischemic attack (TIA), and cerebral infarction without residual deficits: Secondary | ICD-10-CM | POA: Insufficient documentation

## 2014-08-08 DIAGNOSIS — F419 Anxiety disorder, unspecified: Secondary | ICD-10-CM | POA: Insufficient documentation

## 2014-08-08 DIAGNOSIS — Z8619 Personal history of other infectious and parasitic diseases: Secondary | ICD-10-CM | POA: Insufficient documentation

## 2014-08-08 DIAGNOSIS — Z9889 Other specified postprocedural states: Secondary | ICD-10-CM

## 2014-08-08 DIAGNOSIS — E669 Obesity, unspecified: Secondary | ICD-10-CM | POA: Insufficient documentation

## 2014-08-08 DIAGNOSIS — Z79899 Other long term (current) drug therapy: Secondary | ICD-10-CM | POA: Insufficient documentation

## 2014-08-08 DIAGNOSIS — K219 Gastro-esophageal reflux disease without esophagitis: Secondary | ICD-10-CM | POA: Insufficient documentation

## 2014-08-08 DIAGNOSIS — I739 Peripheral vascular disease, unspecified: Secondary | ICD-10-CM | POA: Insufficient documentation

## 2014-08-08 DIAGNOSIS — R112 Nausea with vomiting, unspecified: Secondary | ICD-10-CM | POA: Insufficient documentation

## 2014-08-08 DIAGNOSIS — F17210 Nicotine dependence, cigarettes, uncomplicated: Secondary | ICD-10-CM | POA: Insufficient documentation

## 2014-08-08 HISTORY — PX: HYSTEROSCOPY WITH NOVASURE: SHX5574

## 2014-08-08 LAB — CBC WITH DIFFERENTIAL/PLATELET
Basophils Absolute: 0 10*3/uL (ref 0.0–0.1)
Basophils Relative: 0 % (ref 0–1)
Eosinophils Absolute: 0 10*3/uL (ref 0.0–0.7)
Eosinophils Relative: 0 % (ref 0–5)
HCT: 37.9 % (ref 36.0–46.0)
Hemoglobin: 12.9 g/dL (ref 12.0–15.0)
Lymphocytes Relative: 14 % (ref 12–46)
Lymphs Abs: 1.7 10*3/uL (ref 0.7–4.0)
MCH: 32 pg (ref 26.0–34.0)
MCHC: 34 g/dL (ref 30.0–36.0)
MCV: 94 fL (ref 78.0–100.0)
Monocytes Absolute: 0.6 10*3/uL (ref 0.1–1.0)
Monocytes Relative: 5 % (ref 3–12)
Neutro Abs: 9.4 10*3/uL — ABNORMAL HIGH (ref 1.7–7.7)
Neutrophils Relative %: 81 % — ABNORMAL HIGH (ref 43–77)
Platelets: 272 10*3/uL (ref 150–400)
RBC: 4.03 MIL/uL (ref 3.87–5.11)
RDW: 12.3 % (ref 11.5–15.5)
WBC: 11.6 10*3/uL — ABNORMAL HIGH (ref 4.0–10.5)

## 2014-08-08 LAB — COMPREHENSIVE METABOLIC PANEL
ALT: 17 U/L (ref 0–35)
AST: 26 U/L (ref 0–37)
Albumin: 4 g/dL (ref 3.5–5.2)
Alkaline Phosphatase: 66 U/L (ref 39–117)
Anion gap: 10 (ref 5–15)
BUN: 10 mg/dL (ref 6–23)
CO2: 21 mmol/L (ref 19–32)
Calcium: 9.4 mg/dL (ref 8.4–10.5)
Chloride: 109 mmol/L (ref 96–112)
Creatinine, Ser: 0.71 mg/dL (ref 0.50–1.10)
GFR calc Af Amer: >90 mL/min (ref >90)
GFR calc non Af Amer: >90 mL/min (ref >90)
Glucose, Bld: 110 mg/dL — ABNORMAL HIGH (ref 70–99)
Potassium: 4.3 mmol/L (ref 3.5–5.1)
Sodium: 140 mmol/L (ref 135–145)
Total Bilirubin: 0.9 mg/dL (ref 0.3–1.2)
Total Protein: 7.4 g/dL (ref 6.0–8.3)

## 2014-08-08 LAB — URINE MICROSCOPIC-ADD ON

## 2014-08-08 LAB — URINALYSIS, ROUTINE W REFLEX MICROSCOPIC
Bilirubin Urine: NEGATIVE
Glucose, UA: NEGATIVE mg/dL
Ketones, ur: NEGATIVE mg/dL
Leukocytes, UA: NEGATIVE
Nitrite: NEGATIVE
Protein, ur: NEGATIVE mg/dL
Specific Gravity, Urine: 1.013 (ref 1.005–1.030)
Urobilinogen, UA: 1 mg/dL (ref 0.0–1.0)
pH: 7.5 (ref 5.0–8.0)

## 2014-08-08 LAB — PREGNANCY, URINE: Preg Test, Ur: NEGATIVE

## 2014-08-08 SURGERY — HYSTEROSCOPY WITH NOVASURE
Anesthesia: General | Site: Vagina

## 2014-08-08 MED ORDER — LIDOCAINE HCL (CARDIAC) 20 MG/ML IV SOLN
INTRAVENOUS | Status: AC
  Filled 2014-08-08: qty 5

## 2014-08-08 MED ORDER — SODIUM CHLORIDE 0.9 % IV BOLUS (SEPSIS)
1000.0000 mL | Freq: Once | INTRAVENOUS | Status: AC
  Administered 2014-08-08: 1000 mL via INTRAVENOUS

## 2014-08-08 MED ORDER — ONDANSETRON HCL 4 MG/2ML IJ SOLN
INTRAMUSCULAR | Status: DC | PRN
  Administered 2014-08-08: 4 mg via INTRAVENOUS

## 2014-08-08 MED ORDER — OXYCODONE HCL 5 MG/5ML PO SOLN: 5.0000 mg | Freq: Once | ORAL | Status: AC | PRN

## 2014-08-08 MED ORDER — IBUPROFEN 600 MG PO TABS: 600.0000 mg | ORAL_TABLET | Freq: Four times a day (QID) | ORAL | Status: DC | PRN

## 2014-08-08 MED ORDER — LIDOCAINE HCL (CARDIAC) 20 MG/ML IV SOLN
INTRAVENOUS | Status: DC | PRN
  Administered 2014-08-08: 80 mg via INTRAVENOUS

## 2014-08-08 MED ORDER — SCOPOLAMINE 1 MG/3DAYS TD PT72
MEDICATED_PATCH | TRANSDERMAL | Status: AC
  Administered 2014-08-08: 1.5 mg via TRANSDERMAL
  Filled 2014-08-08: qty 1

## 2014-08-08 MED ORDER — MEPERIDINE HCL 25 MG/ML IJ SOLN: 6.2500 mg | INTRAMUSCULAR | Status: DC | PRN

## 2014-08-08 MED ORDER — DEXAMETHASONE SODIUM PHOSPHATE 10 MG/ML IJ SOLN
INTRAMUSCULAR | Status: AC
  Filled 2014-08-08: qty 1

## 2014-08-08 MED ORDER — MIDAZOLAM HCL 2 MG/2ML IJ SOLN
INTRAMUSCULAR | Status: AC
  Filled 2014-08-08: qty 2

## 2014-08-08 MED ORDER — FENTANYL CITRATE 0.05 MG/ML IJ SOLN
INTRAMUSCULAR | Status: AC
  Filled 2014-08-08: qty 2

## 2014-08-08 MED ORDER — METOCLOPRAMIDE HCL 5 MG/ML IJ SOLN: 10.0000 mg | Freq: Once | INTRAMUSCULAR | Status: DC | PRN

## 2014-08-08 MED ORDER — ONDANSETRON HCL 4 MG/2ML IJ SOLN
4.0000 mg | Freq: Once | INTRAMUSCULAR | Status: AC
  Administered 2014-08-08: 4 mg via INTRAVENOUS
  Filled 2014-08-08: qty 2

## 2014-08-08 MED ORDER — LACTATED RINGERS IV SOLN
INTRAVENOUS | Status: DC
  Administered 2014-08-08 (×2): via INTRAVENOUS

## 2014-08-08 MED ORDER — ONDANSETRON HCL 4 MG/2ML IJ SOLN
INTRAMUSCULAR | Status: AC
  Filled 2014-08-08: qty 2

## 2014-08-08 MED ORDER — KETOROLAC TROMETHAMINE 30 MG/ML IJ SOLN
INTRAMUSCULAR | Status: AC
  Filled 2014-08-08: qty 1

## 2014-08-08 MED ORDER — OXYCODONE HCL 5 MG PO TABS
ORAL_TABLET | ORAL | Status: AC
  Filled 2014-08-08: qty 1

## 2014-08-08 MED ORDER — PROMETHAZINE HCL 25 MG PO TABS: 25.0000 mg | ORAL_TABLET | Freq: Four times a day (QID) | ORAL | Status: DC | PRN

## 2014-08-08 MED ORDER — OXYCODONE HCL 5 MG PO TABS
5.0000 mg | ORAL_TABLET | Freq: Once | ORAL | Status: AC | PRN
  Administered 2014-08-08: 5 mg via ORAL

## 2014-08-08 MED ORDER — MIDAZOLAM HCL 5 MG/5ML IJ SOLN
INTRAMUSCULAR | Status: DC | PRN
  Administered 2014-08-08: 2 mg via INTRAVENOUS

## 2014-08-08 MED ORDER — BUPIVACAINE HCL (PF) 0.5 % IJ SOLN
INTRAMUSCULAR | Status: AC
Start: 2014-08-08 — End: 2014-08-08
  Filled 2014-08-08: qty 30

## 2014-08-08 MED ORDER — KETOROLAC TROMETHAMINE 30 MG/ML IJ SOLN
INTRAMUSCULAR | Status: DC | PRN
  Administered 2014-08-08: 30 mg via INTRAVENOUS

## 2014-08-08 MED ORDER — PROPOFOL 10 MG/ML IV BOLUS
INTRAVENOUS | Status: AC
  Filled 2014-08-08: qty 20

## 2014-08-08 MED ORDER — HYDROMORPHONE HCL 1 MG/ML IJ SOLN
1.0000 mg | Freq: Once | INTRAMUSCULAR | Status: AC
Start: 2014-08-08 — End: 2014-08-08
  Administered 2014-08-08: 1 mg via INTRAVENOUS
  Filled 2014-08-08: qty 1

## 2014-08-08 MED ORDER — MORPHINE SULFATE 4 MG/ML IJ SOLN
1.0000 mg | INTRAMUSCULAR | Status: DC | PRN
  Administered 2014-08-08 (×2): 2 mg via INTRAVENOUS

## 2014-08-08 MED ORDER — PROPOFOL 10 MG/ML IV BOLUS
INTRAVENOUS | Status: DC | PRN
  Administered 2014-08-08: 150 mg via INTRAVENOUS

## 2014-08-08 MED ORDER — FENTANYL CITRATE 0.05 MG/ML IJ SOLN
INTRAMUSCULAR | Status: DC | PRN
  Administered 2014-08-08 (×2): 50 ug via INTRAVENOUS

## 2014-08-08 MED ORDER — BUPIVACAINE HCL (PF) 0.5 % IJ SOLN
INTRAMUSCULAR | Status: DC | PRN
  Administered 2014-08-08: 30 mL

## 2014-08-08 MED ORDER — MORPHINE SULFATE 4 MG/ML IJ SOLN
INTRAMUSCULAR | Status: AC
  Administered 2014-08-08: 2 mg via INTRAVENOUS
  Filled 2014-08-08: qty 1

## 2014-08-08 MED ORDER — HYDROCODONE-ACETAMINOPHEN 5-325 MG PO TABS
ORAL_TABLET | ORAL | Status: AC
  Filled 2014-08-08: qty 1

## 2014-08-08 MED ORDER — DEXAMETHASONE SODIUM PHOSPHATE 10 MG/ML IJ SOLN
INTRAMUSCULAR | Status: DC | PRN
  Administered 2014-08-08: 4 mg via INTRAVENOUS

## 2014-08-08 MED ORDER — SCOPOLAMINE 1 MG/3DAYS TD PT72
1.0000 | MEDICATED_PATCH | Freq: Once | TRANSDERMAL | Status: DC
  Administered 2014-08-08: 1.5 mg via TRANSDERMAL

## 2014-08-08 SURGICAL SUPPLY — 12 items
ABLATOR ENDOMETRIAL BIPOLAR (ABLATOR) ×4 IMPLANT
CATH ROBINSON RED A/P 16FR (CATHETERS) ×2 IMPLANT
CLOTH BEACON ORANGE TIMEOUT ST (SAFETY) ×2 IMPLANT
DRAPE HYSTEROSCOPY (DRAPE) ×2 IMPLANT
DRSG TELFA 3X8 NADH (GAUZE/BANDAGES/DRESSINGS) IMPLANT
GLOVE BIO SURGEON STRL SZ 6.5 (GLOVE) ×2 IMPLANT
GOWN STRL REUS W/TWL LRG LVL3 (GOWN DISPOSABLE) ×6 IMPLANT
PACK VAGINAL MINOR WOMEN LF (CUSTOM PROCEDURE TRAY) ×2 IMPLANT
PAD OB MATERNITY 4.3X12.25 (PERSONAL CARE ITEMS) ×2 IMPLANT
TOWEL OR 17X24 6PK STRL BLUE (TOWEL DISPOSABLE) ×4 IMPLANT
TUBING AQUILEX INFLOW (TUBING) ×2 IMPLANT
WATER STERILE IRR 1000ML POUR (IV SOLUTION) ×2 IMPLANT

## 2014-08-08 NOTE — Transfer of Care (Signed)
Immediate Anesthesia Transfer of Care Note  Patient: Caitlin Valdez  Procedure(s) Performed: Procedure(s):  NOVASURE (N/A)  Patient Location: PACU  Anesthesia Type:General  Level of Consciousness: sedated  Airway & Oxygen Therapy: Patient Spontanous Breathing and Patient connected to nasal cannula oxygen  Post-op Assessment: Report given to PACU RN and Post -op Vital signs reviewed and stable  Post vital signs: stable  Complications: No apparent anesthesia complications

## 2014-08-08 NOTE — Discharge Instructions (Addendum)
No ibuprofen containing products (ie Advil, Aleve, Motrin, etc) until after 5:15 pm tonight.  Endometrial Ablation Endometrial ablation removes the lining of the uterus (endometrium). It is usually a same-day, outpatient treatment. Ablation helps avoid major surgery, such as surgery to remove the cervix and uterus (hysterectomy). After endometrial ablation, you will have little or no menstrual bleeding and may not be able to have children. However, if you are premenopausal, you will need to use a reliable method of birth control following the procedure because of the small chance that pregnancy can occur. There are different reasons to have this procedure, which include:  Heavy periods.  Bleeding that is causing anemia.  Irregular bleeding.  Bleeding fibroids on the lining inside the uterus if they are smaller than 3 centimeters. This procedure should not be done if:  You want children in the future.  You have severe cramps with your menstrual period.  You have precancerous or cancerous cells in your uterus.  You were recently pregnant.  You have gone through menopause.  You have had major surgery on the uterus, such as a cesarean delivery. LET [facility] CARE PROVIDER KNOW ABOUT:  Any allergies you have.  All medicines you are taking, including vitamins, herbs, eye drops, creams, and over-the-counter medicines.  Previous problems you or members of your family have had with the use of anesthetics.  Any blood disorders you have.  Previous surgeries you have had.  Medical conditions you have. RISKS AND COMPLICATIONS  Generally, this is a safe procedure. However, as with any procedure, complications can occur. Possible complications include:  Perforation of the uterus.  Bleeding.  Infection of the uterus, bladder, or vagina.  Injury to surrounding organs.  An air bubble to the lung (air embolus).  Pregnancy following the procedure.  Failure of the procedure to  help the problem, requiring hysterectomy.  Decreased ability to diagnose cancer in the lining of the uterus. BEFORE THE PROCEDURE  The lining of the uterus must be tested to make sure there is no pre-cancerous or cancer cells present.  An ultrasound may be performed to look at the size of the uterus and to check for abnormalities.  Medicines may be given to thin the lining of the uterus. PROCEDURE  During the procedure, your health care provider will use a tool called a resectoscope to help see inside your uterus. There are different ways to remove the lining of your uterus.   Radiofrequency - This method uses a radiofrequency-alternating electric current to remove the lining of the uterus.  Cryotherapy - This method uses extreme cold to freeze the lining of the uterus.  Heated-Free Liquid - This method uses heated salt (saline) solution to remove the lining of the uterus.  Microwave - This method uses high-energy microwaves to heat up the lining of the uterus to remove it.  Thermal balloon - This method involves inserting a catheter with a balloon tip into the uterus. The balloon tip is filled with heated fluid to remove the lining of the uterus. AFTER THE PROCEDURE  After your procedure, do not have sexual intercourse or insert anything into your vagina until permitted by your health care provider. After the procedure, you may experience:  Cramps.  Vaginal discharge.  Frequent urination. Document Released: 05/10/2004 Document Revised: 03/03/2013 Document Reviewed: 12/02/2012 [facility] Patient Information 2015 [place], [place]. This information is not intended to replace advice given to you by your health care provider. Make sure you discuss any questions you have with your health  care provider.  ENDOMETRIAL ABLATION The following instructions have been prepared to help you care for yourself upon your return home.  Personal hygiene:  Use sanitary pads for vaginal drainage, not  tampons.  Shower the day after your procedure.  NO tub baths, pools or Jacuzzis for 2-3 weeks.  Wipe front to back after using the bathroom.  Activity and limitations:  Do NOT drive or operate any equipment for 24 hours. The effects of anesthesia are still present and drowsiness may result.  Do NOT rest in bed all day.  Walking is encouraged.  Walk up and down stairs slowly.  You may resume your normal activity in one to two days or as indicated by your physician. Sexual activity: NO intercourse for at least 2 weeks after the procedure, or as indicated by your Doctor.  Diet: Eat a light meal as desired this evening. You may resume your usual diet tomorrow.  Return to Work: You may resume your work activities in one to two days or as indicated by Marine scientist.  What to expect after your surgery: Expect to have vaginal bleeding/discharge for 2-3 days and spotting for up to 10 days. It is not unusual to have soreness for up to 1-2 weeks. You may have a slight burning sensation when you urinate for the first day. Mild cramps may continue for a couple of days. You may have a regular period in 2-6 weeks.  Call your doctor for any of the following:  Excessive vaginal bleeding or clotting, saturating and changing one pad every hour.  Inability to urinate 6 hours after discharge from hospital.  Pain not relieved by pain medication.  Fever of 100.4 F or greater.  Unusual vaginal discharge or odor.  Return to office _________________Call for an appointment ___________________ Patients signature: ______________________ Nurses signature ________________________  [place] [phone]

## 2014-08-08 NOTE — ED Notes (Signed)
Patient told we will need urine.

## 2014-08-08 NOTE — Discharge Instructions (Signed)

## 2014-08-08 NOTE — ED Provider Notes (Signed)
CSN: [id number]     Arrival date & time 08/08/14  1946 History   First MD Initiated Contact with Patient 08/08/14 1946     Chief Complaint  Patient presents with  . Post-op Problem     The history is provided by the patient. No language interpreter was used.   Ms. Caitlin Valdez presents for evaluation of lower abdominal pain. This morning she had an endometrial ablation at [facility]. After the procedure she reported that she had lower abdominal pain and was given morphine 2 sent home with Roxicet. Since discharge from the hospital she's had continued lower abdominal pain and vomiting anytime she attempts to take anything by mouth. She denies any fevers. She is able to empty her bladder without difficulty.  She has slight spotting. Sxs are worse with movement.  She is unable to pass gas currently. Symptoms are moderate, constant, worsening.  Past Medical History  Diagnosis Date  . Abdominal pain, right lower quadrant   . Fibromyalgia   . Migraine   . Trichomonas 2012  . Stroke   . Peripheral vascular disease     RIGHT LEG  . Anxiety   . Depression   . GERD (gastroesophageal reflux disease)   . PONV (postoperative nausea and vomiting)    Past Surgical History  Procedure Laterality Date  . Cesarean section    . Vein surgery    . Fracture surgery     Family History  Problem Relation Age of Onset  . Other Neg Hx   . Diabetes Mother   . Hypertension Mother   . Diabetes Father   . Hypertension Father   . Diabetes Other   . Hypertension Other    History  Substance Use Topics  . Smoking status: Current Every Day Smoker -- 0.25 packs/day for 5 years    Types: Cigarettes  . Smokeless tobacco: Never Used  . Alcohol Use: No   OB History    Gravida Para Term Preterm AB TAB SAB Ectopic Multiple Living   2 2 2       2      Review of Systems  All other systems reviewed and are negative.     Allergies  Review of patient's allergies indicates no known allergies.  Home  Medications   Prior to Admission medications   Medication Sig Start Date End Date Taking? Authorizing Provider  acetaminophen-codeine (TYLENOL #3) 300-30 MG per tablet Take 1 tablet by mouth every 8 (eight) hours as needed. Patient not taking: Reported on 08/01/2014 05/09/14   Tresa Garter, MD  cyclobenzaprine (FLEXERIL) 10 MG tablet Take 1 tablet (10 mg total) by mouth 3 (three) times daily as needed for muscle spasms. 05/10/14   Tresa Garter, MD  DULoxetine (CYMBALTA) 60 MG capsule Take 60 mg by mouth every morning.    Historical Provider, MD  gabapentin (NEURONTIN) 300 MG capsule Take 1 capsule (300 mg total) by mouth 3 (three) times daily. Patient not taking: Reported on 06/29/2014 05/10/14   Tresa Garter, MD  HYDROcodone-acetaminophen (NORCO/VICODIN) 5-325 MG per tablet Take 1-2 tablets by mouth every 6 (six) hours as needed. Patient not taking: Reported on 08/01/2014 04/23/14   Glendell Docker, NP  ibuprofen (ADVIL,MOTRIN) 600 MG tablet Take 1 tablet (600 mg total) by mouth every 6 (six) hours as needed. 08/08/14   Emily Filbert, MD  lurasidone (LATUDA) 40 MG TABS tablet Take 40 mg by mouth at bedtime.     Historical Provider, MD  metroNIDAZOLE (FLAGYL) 500  MG tablet Take 1 tablet (500 mg total) by mouth 2 (two) times daily. Patient not taking: Reported on 08/01/2014 07/27/14   Emily Filbert, MD   BP 137/62 mmHg  Pulse 57  Temp(Src) 98.3 F (36.8 C) (Oral)  Resp 18  SpO2 100%  LMP 07/11/2014 Physical Exam  Constitutional: She is oriented to person, place, and time. She appears well-developed and well-nourished.  HENT:  Head: Normocephalic and atraumatic.  Cardiovascular: Normal rate and regular rhythm.   No murmur heard. Pulmonary/Chest: Effort normal and breath sounds normal. No respiratory distress.  Abdominal: Soft.  Moderate lower abdominal tenderness without guarding or rebound tenderness.  Musculoskeletal: She exhibits no edema or tenderness.  Neurological:  She is alert and oriented to person, place, and time.  Skin: Skin is warm and dry.  Psychiatric: She has a normal mood and affect. Her behavior is normal.  Nursing note and vitals reviewed.   ED Course  Procedures (including critical care time) Labs Review Labs Reviewed  COMPREHENSIVE METABOLIC PANEL - Abnormal; Notable for the following:    Glucose, Bld 110 (*)    All other components within normal limits  CBC WITH DIFFERENTIAL/PLATELET - Abnormal; Notable for the following:    WBC 11.6 (*)    Neutrophils Relative % 81 (*)    Neutro Abs 9.4 (*)    All other components within normal limits  URINALYSIS, ROUTINE W REFLEX MICROSCOPIC - Abnormal; Notable for the following:    Hgb urine dipstick MODERATE (*)    All other components within normal limits  URINE MICROSCOPIC-ADD ON    Imaging Review No results found.   EKG Interpretation None      MDM   Final diagnoses:  Post-operative nausea and vomiting    Patient here for evaluation of lower abdominal pain and vomiting following endometrial ablation. Patient's symptoms improved after 1 tenderness of pain and nausea medicines. She is tolerating oral fluids in the emergency department. Discussed the case with gynecology on-call, given improvement in patient's exam and that she saw tolerating oral fluids will have her DC home with outpatient follow-up. Discussed the patient home care and return precautions. Clinical picture is not consistent with acute perforation or infectious process.   Quintella Reichert, MD 08/09/14 1610

## 2014-08-08 NOTE — Anesthesia Postprocedure Evaluation (Signed)
Anesthesia Post Note  Patient: Caitlin Valdez  Procedure(s) Performed: Procedure(s) (LRB):  NOVASURE (N/A)  Anesthesia type: General  Patient location: PACU  Post pain: Pain level controlled  Post assessment: Post-op Vital signs reviewed  Last Vitals:  Filed Vitals:   08/08/14 1230  BP: 128/75  Pulse:   Temp: 36.9 C  Resp:     Post vital signs: Reviewed  Level of consciousness: sedated  Complications: No apparent anesthesia complications

## 2014-08-08 NOTE — ED Notes (Signed)
Bed: WA04 Expected date: 08/08/14 Expected time: 7:36 PM Means of arrival: Ambulance Comments: 44 yo F  abd pain

## 2014-08-08 NOTE — Op Note (Signed)
08/08/2014  11:26 AM  PATIENT:  Caitlin Valdez  44 y.o. female  PRE-OPERATIVE DIAGNOSIS:  difuctional uterine bleeding  POST-OPERATIVE DIAGNOSIS:  difuctional uterine bleeding  PROCEDURE:  Procedure(s):  NOVASURE (N/A)  SURGEON:  Surgeon(s) and Role:     Emily Filbert, MD - Primary   ANESTHESIA:   general  EBL:  Total I/O In: 1000 [I.V.:1000] Out: 75 [Urine:75]  BLOOD ADMINISTERED:none  DRAINS: none   LOCAL MEDICATIONS USED:  MARCAINE     SPECIMEN:  No Specimen  DISPOSITION OF SPECIMEN:  N/A  COUNTS:  YES  TOURNIQUET:  * No tourniquets in log *  DICTATION: .Dragon Dictation  PLAN OF CARE: Discharge to home after PACU  PATIENT DISPOSITION:  PACU - hemodynamically stable.   Delay start of Pharmacological VTE agent (>24hrs) due to surgical blood loss or risk of bleeding: not applicable    The risks, benefits, and alternatives of surgery were explained, understood, and accepted. I quoted her a 90% satisfaction rate for the NovaSure endometrial ablation. All questions were answered. She was taken to the operating room and placed in the dorsal lithotomy position. General anesthesia was applied without complication. Her vagina was prepped and draped in the usual sterile fashion.  A bimanual exam revealed a normal size and shaped mobile uterus is anteverted. Her adnexa were non-enlarged. A speculum was placed posteriorly and the anterior lip of her cervix was grasped with a single-tooth tenaculum. A paracervical block was performed using 30 mL of 0.5% Marcaine. Her uterus sounded to 10 cm. Her cervical length measured 5 cm. This gives her uterine cavity length of 5 cm. The cervix was gently dilated with Hegar dilators to accommodate the NovaSure device. The arms of the device was deployed and the uterine cavity width measured 4.0 cm. The device passed its test. An it ran for 83 seconds. I removed the tenaculum and no bleeding was noted. The NovaSure device was removed and no  bleeding was noted from the endocervix. She was extubated and taken to the recovery room in stable condition. She tolerated the procedure well.

## 2014-08-08 NOTE — ED Notes (Signed)
Patient had endometrial ablation today at [facility] and now has N/V and pain. Feels like she cannot urinate. Has not urinated since surgery. Alert and oriented. Ambulatory.

## 2014-08-08 NOTE — Anesthesia Preprocedure Evaluation (Signed)
Anesthesia Evaluation  Patient identified by MRN, date of birth, ID band Patient awake    Reviewed: Allergy & Precautions, NPO status , Patient's Chart, lab work & pertinent test results  History of Anesthesia Complications (+) PONV and history of anesthetic complications  Airway Mallampati: II  TM Distance: >3 FB Neck ROM: Full    Dental no notable dental hx. (+) Teeth Intact   Pulmonary Current Smoker,  breath sounds clear to auscultation  Pulmonary exam normal       Cardiovascular + Peripheral Vascular Disease negative cardio ROS  Rhythm:Regular Rate:Normal     Neuro/Psych  Headaches, PSYCHIATRIC DISORDERS Anxiety Depression  Neuromuscular disease CVA, No Residual Symptoms    GI/Hepatic Neg liver ROS, GERD-  Medicated and Controlled,  Endo/Other  Obesity  Renal/GU negative Renal ROS  negative genitourinary   Musculoskeletal  (+) Fibromyalgia -  Abdominal (+) + obese,   Peds  Hematology negative hematology ROS (+)   Anesthesia Other Findings   Reproductive/Obstetrics DUB                             Anesthesia Physical Anesthesia Plan  ASA: II  Anesthesia Plan: General   Post-op Pain Management:    Induction: Intravenous  Airway Management Planned: LMA  Additional Equipment:   Intra-op Plan:   Post-operative Plan: Extubation in OR  Informed Consent: I have reviewed the patients History and Physical, chart, labs and discussed the procedure including the risks, benefits and alternatives for the proposed anesthesia with the patient or authorized representative who has indicated his/her understanding and acceptance.   Dental advisory given  Plan Discussed with: CRNA, Anesthesiologist and Surgeon  Anesthesia Plan Comments:         Anesthesia Quick Evaluation

## 2014-08-08 NOTE — H&P (Signed)
Caitlin Valdez is an 44 y.o. female. She has had DUB for years. She uses condoms and BTL for contraception. Her EMBX was normal. Her u/s showed a 1.6 cm fibroid.  OB History: P2   Menstrual History: Menarche age: 54 Patient's last menstrual period was 07/11/2014 (exact date).    Past Medical History  Diagnosis Date  . Abdominal pain, right lower quadrant   . Fibromyalgia   . Migraine   . Trichomonas 2012  . Stroke   . Peripheral vascular disease     RIGHT LEG  . Anxiety   . Depression   . GERD (gastroesophageal reflux disease)   . PONV (postoperative nausea and vomiting)     Past Surgical History  Procedure Laterality Date  . Cesarean section    . Vein surgery    . Fracture surgery      Family History  Problem Relation Age of Onset  . Other Neg Hx   . Diabetes Mother   . Hypertension Mother   . Diabetes Father   . Hypertension Father   . Diabetes Other   . Hypertension Other     Social History:  reports that she has been smoking Cigarettes.  She has a 1.25 pack-year smoking history. She has never used smokeless tobacco. She reports that she does not drink alcohol or use illicit drugs.  Allergies: No Known Allergies  Prescriptions prior to admission  Medication Sig Dispense Refill Last Dose  . ibuprofen (ADVIL,MOTRIN) 200 MG tablet Take 400 mg by mouth every 6 (six) hours as needed for headache.     [name] acetaminophen-codeine (TYLENOL #3) 300-30 MG per tablet Take 1 tablet by mouth every 8 (eight) hours as needed. (Patient not taking: Reported on 08/01/2014) 60 tablet 0 Not Taking at Unknown time  . cyclobenzaprine (FLEXERIL) 10 MG tablet Take 1 tablet (10 mg total) by mouth 3 (three) times daily as needed for muscle spasms. 30 tablet 0 Taking  . DULoxetine (CYMBALTA) 60 MG capsule Take 60 mg by mouth every morning.   Taking  . gabapentin (NEURONTIN) 300 MG capsule Take 1 capsule (300 mg total) by mouth 3 (three) times daily. (Patient not taking: Reported on 06/29/2014)  90 capsule 3 Not Taking at Unknown time  . HYDROcodone-acetaminophen (NORCO/VICODIN) 5-325 MG per tablet Take 1-2 tablets by mouth every 6 (six) hours as needed. (Patient not taking: Reported on 08/01/2014) 10 tablet 0 Not Taking at Unknown time  . lurasidone (LATUDA) 40 MG TABS tablet Take 40 mg by mouth at bedtime.    Taking  . metroNIDAZOLE (FLAGYL) 500 MG tablet Take 1 tablet (500 mg total) by mouth 2 (two) times daily. (Patient not taking: Reported on 06/29/2014) 14 tablet 0 Completed Course at Unknown time  . metroNIDAZOLE (FLAGYL) 500 MG tablet Take 1 tablet (500 mg total) by mouth 2 (two) times daily. 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  Last menstrual period 07/11/2014. Physical Exam  Heart- rrr Lungs- CTAB Abd- benign  No results found for this or any previous visit (from the past 24 hour(s)).  No results found.  Assessment/Plan: DUB- plan for endometrial ablation.  She understands the risks of surgery, including, but not to infection, bleeding, DVTs, damage to bowel, bladder, ureters. She wishes to proceed.     Kadiatou Oplinger C. 08/08/2014, 10:05 AM

## 2014-08-09 ENCOUNTER — Encounter (HOSPITAL_COMMUNITY): Payer: Self-pay | Admitting: Obstetrics & Gynecology

## 2014-09-28 NOTE — Progress Notes (Signed)
Patient ID: Caitlin Valdez, female   DOB: 10/29/1970, 44 y.o.   MRN: [id number]   Caitlin Valdez, is a 44 y.o. female  QIW:[id number]  ERD:[id number]  DOB - [birth year]  No chief complaint on file.       Subjective:   Caitlin Valdez is a 44 y.o. female here today for a follow up visit. Patient has past medical history of fibromyalgia here today for routine follow-up. She is requesting an injection for pain. Patient has No headache, No chest pain, No abdominal pain - No Nausea, No new weakness tingling or numbness, No Cough - SOB.  No problems updated.  ALLERGIES: No Known Allergies  PAST MEDICAL HISTORY: Past Medical History  Diagnosis Date  . Abdominal pain, right lower quadrant   . Fibromyalgia   . Migraine   . Trichomonas 2012  . Stroke   . Peripheral vascular disease     RIGHT LEG  . Anxiety   . Depression   . GERD (gastroesophageal reflux disease)   . PONV (postoperative nausea and vomiting)     MEDICATIONS AT HOME: Prior to Admission medications   Medication Sig Start Date End Date Taking? Authorizing Provider  acetaminophen-codeine (TYLENOL #3) 300-30 MG per tablet Take 1 tablet by mouth every 8 (eight) hours as needed. 05/09/14   Tresa Garter, MD  cyclobenzaprine (FLEXERIL) 10 MG tablet Take 1 tablet (10 mg total) by mouth 3 (three) times daily as needed for muscle spasms. 05/10/14   Tresa Garter, MD  DULoxetine (CYMBALTA) 60 MG capsule Take 60 mg by mouth every morning.    Historical Provider, MD  gabapentin (NEURONTIN) 300 MG capsule Take 1 capsule (300 mg total) by mouth 3 (three) times daily. 05/10/14   Tresa Garter, MD  HYDROcodone-acetaminophen (NORCO/VICODIN) 5-325 MG per tablet Take 1-2 tablets by mouth every 6 (six) hours as needed. 04/23/14   Glendell Docker, NP  ibuprofen (ADVIL,MOTRIN) 600 MG tablet Take 1 tablet (600 mg total) by mouth every 6 (six) hours as needed. 08/08/14   Emily Filbert, MD  lurasidone (LATUDA) 40 MG TABS tablet Take 40 mg  by mouth at bedtime.     Historical Provider, MD  metroNIDAZOLE (FLAGYL) 500 MG tablet Take 1 tablet (500 mg total) by mouth 2 (two) times daily. 07/27/14   Emily Filbert, MD  promethazine (PHENERGAN) 25 MG tablet Take 1 tablet (25 mg total) by mouth every 6 (six) hours as needed for nausea or vomiting. 08/08/14   Quintella Reichert, MD     Objective:   There were no vitals filed for this visit.  Exam General appearance : Awake, alert, not in any distress. Speech Clear. Not toxic looking HEENT: Atraumatic and Normocephalic, pupils equally reactive to light and accomodation Neck: supple, no JVD. No cervical lymphadenopathy.  Chest:Good air entry bilaterally, no added sounds  CVS: S1 S2 regular, no murmurs.  Abdomen: Bowel sounds present, Non tender and not distended with no gaurding, rigidity or rebound. Extremities: B/L Lower Ext shows no edema, both legs are warm to touch Neurology: Awake alert, and oriented X 3, CN II-XII intact, Non focal Skin:No Rash  Data Review Lab Results  Component Value Date   HGBA1C 5.2 12/23/2013     Assessment & Plan   1. Fibromyalgia  - ketorolac (TORADOL) 30 MG/ML injection 60 mg; Inject 2 mLs (60 mg total) into the muscle once.   Patient have been counseled extensively about nutrition and exercise  Return if symptoms worsen or fail to  improve, for Follow up Pain and comorbidities.  The patient was given clear instructions to go to ER or return to medical center if symptoms don't improve, worsen or new problems develop. The patient verbalized understanding. The patient was told to call to get lab results if they haven't heard anything in the next week.   This note has been created with Surveyor, quantity. Any transcriptional errors are unintentional.    Angelica Chessman, MD, [place], [place], [place] and [facility] [place], [place]

## 2014-10-25 ENCOUNTER — Telehealth: Payer: Self-pay | Admitting: Internal Medicine

## 2014-10-25 NOTE — Telephone Encounter (Signed)
Patient is calling to request a prescription for Tylenol #3, patient states that she is in a lot of pain and needs something to treat. Please f/u

## 2014-11-10 NOTE — Telephone Encounter (Signed)
Patient needs follow up appointment to get refill of this

## 2014-11-22 ENCOUNTER — Encounter: Payer: Self-pay | Admitting: Family Medicine

## 2014-11-22 ENCOUNTER — Ambulatory Visit: Payer: Self-pay | Attending: Family Medicine | Admitting: Family Medicine

## 2014-11-22 ENCOUNTER — Telehealth: Payer: Self-pay | Admitting: *Deleted

## 2014-11-22 VITALS — BP 109/79 | HR 81 | Temp 98.3°F | Resp 16 | Ht 63.0 in | Wt 181.0 lb

## 2014-11-22 DIAGNOSIS — F17200 Nicotine dependence, unspecified, uncomplicated: Secondary | ICD-10-CM | POA: Insufficient documentation

## 2014-11-22 DIAGNOSIS — E559 Vitamin D deficiency, unspecified: Secondary | ICD-10-CM | POA: Insufficient documentation

## 2014-11-22 DIAGNOSIS — M4802 Spinal stenosis, cervical region: Secondary | ICD-10-CM

## 2014-11-22 DIAGNOSIS — N938 Other specified abnormal uterine and vaginal bleeding: Secondary | ICD-10-CM

## 2014-11-22 DIAGNOSIS — F17210 Nicotine dependence, cigarettes, uncomplicated: Secondary | ICD-10-CM

## 2014-11-22 DIAGNOSIS — M797 Fibromyalgia: Secondary | ICD-10-CM | POA: Insufficient documentation

## 2014-11-22 MED ORDER — KETOROLAC TROMETHAMINE 60 MG/2ML IM SOLN
60.0000 mg | Freq: Once | INTRAMUSCULAR | Status: AC
  Administered 2014-11-22: 60 mg via INTRAMUSCULAR

## 2014-11-22 MED ORDER — ACETAMINOPHEN-CODEINE #3 300-30 MG PO TABS: 1.0000 | ORAL_TABLET | Freq: Three times a day (TID) | ORAL | Status: DC | PRN

## 2014-11-22 MED ORDER — DULOXETINE HCL 60 MG PO CPEP: 60.0000 mg | ORAL_CAPSULE | Freq: Every morning | ORAL | Status: DC

## 2014-11-22 MED ORDER — GABAPENTIN 300 MG PO CAPS: 300.0000 mg | ORAL_CAPSULE | Freq: Three times a day (TID) | ORAL | Status: DC

## 2014-11-22 NOTE — Assessment & Plan Note (Signed)
Fibromyalgia:  Refilled tylenol #3  Cymbalta 60 mg daily Gabapentin 300 mg three times daily Toradol shot given  Sugar free tart cherry juice is a natural antiinflammatory that you can drink to help with pain 6 oz 1-3 times a day  Stay active  Checking vitamin D level   Check out this website: [email or web address].php  Counseling recommended if you develop depression or anxiety symptoms   F/u in 4 weeks with RN for Toradol injection if needed F/u with me in 2 months for fibromyalgia

## 2014-11-22 NOTE — Progress Notes (Signed)
Complaining of body ache due fibromyalgia Requesting Rx Tylenol #3

## 2014-11-22 NOTE — Telephone Encounter (Signed)
Called Pt informed letter ready to be pick up Pt stated will pick up on Friday

## 2014-11-22 NOTE — Patient Instructions (Signed)
Caitlin Valdez,  Thank you for coming in today. It was a pleasure meeting you. I look forward to being your primary doctor.   1. Fibromyalgia:  Refilled tylenol #3  Cymbalta 60 mg daily Gabapentin 300 mg three times daily Toradol shot given  Sugar free tart cherry juice is a natural antiinflammatory that you can drink to help with pain 6 oz 1-3 times a day  Stay active  Checking vitamin D level   Check out this website: [email or web address].[email or web address].php  Counseling recommended if you develop depression or anxiety symptoms   F/u in 4 weeks with RN for Toradol injection if needed F/u with me in 2 months for fibromyalgia   Dr. Adrian Blackwater
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
## 2014-11-23 DIAGNOSIS — E559 Vitamin D deficiency, unspecified: Secondary | ICD-10-CM | POA: Insufficient documentation

## 2014-11-23 LAB — VITAMIN D 25 HYDROXY (VIT D DEFICIENCY, FRACTURES): Vit D, 25-Hydroxy: 16 ng/mL — ABNORMAL LOW (ref 30–100)

## 2014-11-23 MED ORDER — VITAMIN D (ERGOCALCIFEROL) 1.25 MG (50000 UNIT) PO CAPS: 50000.0000 [IU] | ORAL_CAPSULE | ORAL | Status: DC

## 2014-11-23 NOTE — Addendum Note (Signed)
Addended by: Boykin Nearing on: 11/23/2014 09:25 AM   Modules accepted: Orders

## 2014-11-23 NOTE — Assessment & Plan Note (Signed)
Vit D deficiency will replace with 50 K U of vit D x 12 weeks

## 2014-12-09 ENCOUNTER — Telehealth: Payer: Self-pay | Admitting: *Deleted

## 2014-12-09 NOTE — Telephone Encounter (Signed)
Unable to contact pt Subscriber not in service

## 2014-12-09 NOTE — Telephone Encounter (Signed)
-----   Message from Boykin Nearing, MD sent at 11/23/2014  9:23 AM EDT ----- Vit D deficiency will replace with 50 K U of vit D x 12 weeks

## 2014-12-19 ENCOUNTER — Ambulatory Visit: Payer: Self-pay | Attending: Internal Medicine

## 2014-12-29 ENCOUNTER — Encounter: Payer: Self-pay | Admitting: Family Medicine

## 2014-12-29 ENCOUNTER — Ambulatory Visit: Payer: Self-pay | Attending: Family Medicine | Admitting: Family Medicine

## 2014-12-29 VITALS — BP 104/70 | HR 98 | Temp 98.0°F | Resp 16 | Ht 63.0 in | Wt 178.0 lb

## 2014-12-29 DIAGNOSIS — Z23 Encounter for immunization: Secondary | ICD-10-CM

## 2014-12-29 DIAGNOSIS — J309 Allergic rhinitis, unspecified: Secondary | ICD-10-CM | POA: Insufficient documentation

## 2014-12-29 DIAGNOSIS — M4802 Spinal stenosis, cervical region: Secondary | ICD-10-CM | POA: Insufficient documentation

## 2014-12-29 DIAGNOSIS — F17200 Nicotine dependence, unspecified, uncomplicated: Secondary | ICD-10-CM | POA: Insufficient documentation

## 2014-12-29 MED ORDER — CYCLOBENZAPRINE HCL 10 MG PO TABS: 10.0000 mg | ORAL_TABLET | Freq: Three times a day (TID) | ORAL | Status: DC | PRN

## 2014-12-29 MED ORDER — CETIRIZINE HCL 10 MG PO TABS: 10.0000 mg | ORAL_TABLET | Freq: Every day | ORAL | Status: DC

## 2014-12-29 NOTE — Addendum Note (Signed)
Addended by: Betti Cruz on: 12/29/2014 02:37 PM   Modules accepted: Orders

## 2014-12-29 NOTE — Progress Notes (Signed)
   Subjective:    Patient ID: Caitlin Valdez, female    DOB: [birth year], 44 y.o.   MRN: [id number] CC; headache, neck pain  HPI 44 yo F with fibromyalgia  1. Headache: L sided. Pressure. No fever, chills, dizziness. No improvement with current pain regimen. Out of flexeril.  2. Neck pain: posterior neck pain. Pain radiates to arms. Patient has poor posture. Patient has known cervical DJD. Patient denies arm and hand weakness.   Soc Hx: current smoker  Review of Systems  Eyes: Negative for visual disturbance.  Musculoskeletal: Positive for neck pain and neck stiffness.  Neurological: Positive for headaches.       Objective:   Physical Exam BP 104/70 mmHg  Pulse 98  Temp(Src) 98 F (36.7 C) (Oral)  Resp 16  Ht 5\' 3"  (1.6 m)  Wt 178 lb (80.74 kg)  BMI 31.54 kg/m2  SpO2 97% General appearance: alert, cooperative and no distress Head: Normocephalic, without obvious abnormality, atraumatic Eyes: conjunctivae/corneas clear. PERRL, EOM's intact.  Ears: normal TM's and external ear canals both ears Nose: no discharge, turbinates pink, swollen Throat: lips, mucosa, and tongue normal; teeth and gums normal Neck: no adenopathy, no carotid bruit, no JVD, supple, symmetrical, trachea midline and thyroid not enlarged, symmetric, no tenderness/mass/nodules        Assessment & Plan:

## 2014-12-29 NOTE — Assessment & Plan Note (Signed)
Allergic rhinitis with headache Start zyrtec 10 mg once daily

## 2014-12-29 NOTE — Assessment & Plan Note (Signed)
Neck pain with cervical disc disease: Referral to neurosurgery Refilled flexeril

## 2014-12-29 NOTE — Progress Notes (Signed)
HA worsen in the morning Complaining of body ache no injury  Hx fibromyalgia

## 2014-12-29 NOTE — Patient Instructions (Signed)
Caitlin Valdez,  Thank you for coming in today  1. Neck pain with cervical disc disease: Referral to neurosurgery Refilled flexeril   2. Allergic rhinitis with headache Start zyrtec 10 mg once daily   F/u in 2 months for fibromyalgia   Dr. Adrian Blackwater

## 2015-01-20 ENCOUNTER — Ambulatory Visit: Payer: Self-pay | Admitting: Family Medicine

## 2015-03-17 ENCOUNTER — Ambulatory Visit: Payer: Self-pay

## 2015-03-24 ENCOUNTER — Other Ambulatory Visit: Payer: Self-pay | Admitting: Family Medicine

## 2015-03-24 NOTE — Telephone Encounter (Signed)
Patient called requesting a refill for acetaminophen-codeine (TYLENOL #3) 300-30 MG per tablet, please follow up with pt.

## 2015-03-29 ENCOUNTER — Other Ambulatory Visit: Payer: Self-pay | Admitting: Family Medicine

## 2015-03-29 NOTE — Telephone Encounter (Signed)
Tylenol #3 refilled  

## 2015-03-31 ENCOUNTER — Encounter: Payer: Self-pay | Admitting: Family Medicine

## 2015-03-31 ENCOUNTER — Ambulatory Visit: Payer: Self-pay | Attending: Family Medicine | Admitting: Family Medicine

## 2015-03-31 VITALS — BP 104/70 | HR 70 | Temp 98.0°F | Resp 16 | Ht 63.0 in | Wt 187.0 lb

## 2015-03-31 DIAGNOSIS — E559 Vitamin D deficiency, unspecified: Secondary | ICD-10-CM | POA: Insufficient documentation

## 2015-03-31 DIAGNOSIS — M797 Fibromyalgia: Secondary | ICD-10-CM | POA: Insufficient documentation

## 2015-03-31 DIAGNOSIS — N938 Other specified abnormal uterine and vaginal bleeding: Secondary | ICD-10-CM | POA: Insufficient documentation

## 2015-03-31 DIAGNOSIS — Z23 Encounter for immunization: Secondary | ICD-10-CM | POA: Insufficient documentation

## 2015-03-31 MED ORDER — GABAPENTIN 300 MG PO CAPS: 300.0000 mg | ORAL_CAPSULE | Freq: Three times a day (TID) | ORAL | Status: DC

## 2015-03-31 MED ORDER — KETOROLAC TROMETHAMINE 60 MG/2ML IM SOLN
60.0000 mg | Freq: Once | INTRAMUSCULAR | Status: AC
  Administered 2015-03-31: 60 mg via INTRAMUSCULAR

## 2015-03-31 NOTE — Assessment & Plan Note (Signed)
Chronic pain and fibromyalgia: persistent with msk tenderness   Med: not taking gabapentin   Normal exam today except for muscle tenderness toradol shot today Start gabapentin   Unfortunately, fibromyalgia is not considered a medical disability

## 2015-03-31 NOTE — Progress Notes (Signed)
C/C body ache "body hurt all over" Pain Scale #8 Student loan form.

## 2015-03-31 NOTE — Progress Notes (Signed)
Patient ID: Caitlin Valdez, female   DOB: [birth year], 44 y.o.   MRN: [id number]   Subjective:  Patient ID: Caitlin Valdez, female    DOB: 1970-10-27  Age: 44 y.o. MRN: [id number]  CC: Leg Pain  HPI KIANNI LHEUREUX presents for pain related to fibromyalgia. She has chronic MSK pain. She has tenderness. No joint swelling. No trauma. She has a form today that she would like me to fill out if possible.   Outpatient Prescriptions Prior to Visit  Medication Sig Dispense Refill  . acetaminophen-codeine (TYLENOL #3) 300-30 MG per tablet TAKE 1 TABLET BY MOUTH EVERY 8 HOURS AS NEEDED 60 tablet 1  . cetirizine (ZYRTEC) 10 MG tablet Take 1 tablet (10 mg total) by mouth daily. 30 tablet 11  . cyclobenzaprine (FLEXERIL) 10 MG tablet Take 1 tablet (10 mg total) by mouth 3 (three) times daily as needed for muscle spasms. 30 tablet 3  . DULoxetine (CYMBALTA) 60 MG capsule Take 1 capsule (60 mg total) by mouth every morning. 30 capsule 5  . ibuprofen (ADVIL,MOTRIN) 600 MG tablet Take 1 tablet (600 mg total) by mouth every 6 (six) hours as needed. 30 tablet 1  . lurasidone (LATUDA) 40 MG TABS tablet Take 40 mg by mouth at bedtime.     . Vitamin D, Ergocalciferol, (DRISDOL) 50000 UNITS CAPS capsule Take 1 capsule (50,000 Units total) by mouth every 7 (seven) days. For 12 total weeks 4 capsule 2  . gabapentin (NEURONTIN) 300 MG capsule Take 1 capsule (300 mg total) by mouth 3 (three) times daily. (Patient not taking: Reported on 03/31/2015) 90 capsule 3  . metroNIDAZOLE (FLAGYL) 500 MG tablet Take 1 tablet (500 mg total) by mouth 2 (two) times daily. (Patient not taking: Reported on 03/31/2015) 14 tablet 6   No facility-administered medications prior to visit.   Social History  Substance Use Topics  . Smoking status: Current Every Day Smoker -- 0.25 packs/day for 5 years    Types: Cigarettes  . Smokeless tobacco: Never Used  . Alcohol Use: No   ROS Review of Systems  Constitutional: Negative for fever and chills.    Eyes: Negative for visual disturbance.  Respiratory: Negative for shortness of breath.   Cardiovascular: Negative for chest pain.  Gastrointestinal: Negative for abdominal pain and blood in stool.  Musculoskeletal: Positive for myalgias. Negative for back pain and arthralgias.  Skin: Negative for rash.  Allergic/Immunologic: Negative for immunocompromised state.  Neurological: Positive for numbness.  Hematological: Negative for adenopathy. Does not bruise/bleed easily.  Psychiatric/Behavioral: Negative for suicidal ideas and dysphoric mood.    Objective:  BP 104/70 mmHg  Pulse 70  Temp(Src) 98 F (36.7 C) (Oral)  Resp 16  Ht 5\' 3"  (1.6 m)  Wt 187 lb (84.823 kg)  BMI 33.13 kg/m2  SpO2 95%  BP/Weight 03/31/2015 12/29/2014 0/76/8088  Systolic BP 110 315 945  Diastolic BP 70 70 79  Wt. (Lbs) 187 178 181  BMI 33.13 31.54 32.07   Physical Exam  Constitutional: She is oriented to person, place, and time. She appears well-developed and well-nourished. No distress.  Cardiovascular: Normal rate, regular rhythm, normal heart sounds and intact distal pulses.   Pulmonary/Chest: Effort normal and breath sounds normal.  Musculoskeletal: She exhibits tenderness. She exhibits no edema.  MSK tenderness in multiple site on calf, arms, upper back   Neurological: She is alert and oriented to person, place, and time.  Skin: Skin is warm and dry. No rash noted.  Psychiatric: She  has a normal mood and affect.    Assessment & Plan:   Problem List Items Addressed This Visit    DUB (dysfunctional uterine bleeding)    Resolved after novasure       Fibromyalgia - Primary (Chronic)    Chronic pain and fibromyalgia: persistent with msk tenderness   Med: not taking gabapentin   Normal exam today except for muscle tenderness toradol shot today Start gabapentin   Unfortunately, fibromyalgia is not considered a medical disability          Relevant Medications   gabapentin (NEURONTIN) 300  MG capsule   ketorolac (TORADOL) injection 60 mg (Completed)   Other Relevant Orders   Ambulatory referral to Pain Clinic   Vitamin D deficiency     Vit d deficiency: rechecking vit D today        Relevant Orders   Vitamin D, 25-hydroxy    Other Visit Diagnoses    Encounter for immunization           No orders of the defined types were placed in this encounter.    Follow-up: Return in about 3 months (around 06/30/2015) for fibromyalgia .   Boykin Nearing MD

## 2015-03-31 NOTE — Assessment & Plan Note (Signed)
Vit d deficiency: rechecking vit D today

## 2015-03-31 NOTE — Assessment & Plan Note (Signed)
Resolved after novasure

## 2015-03-31 NOTE — Patient Instructions (Addendum)
Ms. Lodato,  1. Chronic pain and fibromyalgia: Normal exam today except for muscle tenderness toradol shot today Start gabapentin   Unfortunately, fibromyalgia is not considered a medical disability   2. Vit d deficiency: rechecking vit D today   F/u in 3 months for chronic pain and fibromyalgia  Dr. Adrian Blackwater

## 2015-04-01 LAB — VITAMIN D 25 HYDROXY (VIT D DEFICIENCY, FRACTURES): Vit D, 25-Hydroxy: 25 ng/mL — ABNORMAL LOW (ref 30–100)

## 2015-04-05 ENCOUNTER — Telehealth: Payer: Self-pay | Admitting: *Deleted

## 2015-04-05 NOTE — Telephone Encounter (Signed)
-----   Message from Boykin Nearing, MD sent at 04/03/2015  1:54 PM EDT ----- Vit D insufficiency Take 2000 IU D3 daily

## 2015-04-05 NOTE — Telephone Encounter (Signed)
Date of birth verified by pt  Vit D results given  Pt advised to take Vit D 2000unit daily

## 2015-05-04 ENCOUNTER — Ambulatory Visit: Payer: Self-pay | Admitting: Obstetrics and Gynecology

## 2015-05-17 ENCOUNTER — Encounter: Payer: Self-pay | Admitting: Obstetrics & Gynecology

## 2015-05-17 ENCOUNTER — Ambulatory Visit (INDEPENDENT_AMBULATORY_CARE_PROVIDER_SITE_OTHER): Payer: Self-pay | Admitting: Obstetrics & Gynecology

## 2015-05-17 VITALS — BP 122/74 | HR 76 | Ht 63.0 in | Wt 185.0 lb

## 2015-05-17 DIAGNOSIS — N898 Other specified noninflammatory disorders of vagina: Secondary | ICD-10-CM

## 2015-05-17 DIAGNOSIS — Z113 Encounter for screening for infections with a predominantly sexual mode of transmission: Secondary | ICD-10-CM

## 2015-05-17 MED ORDER — FLUCONAZOLE 150 MG PO TABS: 150.0000 mg | ORAL_TABLET | Freq: Once | ORAL | Status: DC

## 2015-05-17 NOTE — Progress Notes (Signed)
   Subjective:    Patient ID: Caitlin Valdez, female    DOB: May 28, 1971, 44 y.o.   MRN: [id number]  HPI  44 yo S AA P2 is here with 2 issues.  1) She has a vaginal discharge that is sometimes itchy, no smell. Denies new partner, uses condoms, sometimes withdrawal. She is aware that an endometrial ablation is NOT contraception.  Her other issue is that of moliminal symptoms. I explained that even though she doesn't bleed due to the ablation, she will still ovulate and have symptoms associated with a period. She reports that IBU relieves her   Review of Systems She reports a normal and UTD mammogram. She has occasional hot flashes.    Objective:   Physical Exam  WNWHBFNAD EG, vagina normal There is a thick curdy discharge c/w yeast and a little bit of frothy discharge also      Assessment & Plan:  Vaginitis- I will treat with diflucan and await her cervical cultures and wet prep. I have reassured her about her ovulation symptoms

## 2015-05-18 LAB — WET PREP, GENITAL
Trich, Wet Prep: NONE SEEN
Yeast Wet Prep HPF POC: NONE SEEN

## 2015-05-19 LAB — URINE CYTOLOGY ANCILLARY ONLY
Chlamydia: NEGATIVE
Neisseria Gonorrhea: NEGATIVE

## 2015-05-22 ENCOUNTER — Telehealth: Payer: Self-pay | Admitting: General Practice

## 2015-05-22 DIAGNOSIS — B9689 Other specified bacterial agents as the cause of diseases classified elsewhere: Secondary | ICD-10-CM

## 2015-05-22 DIAGNOSIS — N760 Acute vaginitis: Principal | ICD-10-CM

## 2015-05-22 MED ORDER — METRONIDAZOLE 500 MG PO TABS: 500.0000 mg | ORAL_TABLET | Freq: Two times a day (BID) | ORAL | Status: DC

## 2015-05-22 NOTE — Telephone Encounter (Signed)
Per Dr Hulan Fray patient has BV and should avoid alcohol while on flagyl. Med ordered. Called patient, no answer- left message stating we are trying to reach you with non urgent results, please call us back at the clinics. Patient called back into front office and I informed her of results and medication sent to pharmacy. Patient verbalized understanding and states she keeps getting these all the time. Discussed BV prevention protocol with patient. Patient verbalized understanding to all and is aware to avoid alcohol while on the medication. Patient had no questions

## 2015-06-21 ENCOUNTER — Other Ambulatory Visit: Payer: Self-pay | Admitting: Family Medicine

## 2015-06-23 ENCOUNTER — Telehealth: Payer: Self-pay | Admitting: Family Medicine

## 2015-06-23 NOTE — Telephone Encounter (Signed)
Patient called requesting a medication refill for Tylenol 3 Please follow up.

## 2015-06-26 MED ORDER — ACETAMINOPHEN-CODEINE #3 300-30 MG PO TABS: 1.0000 | ORAL_TABLET | Freq: Three times a day (TID) | ORAL | Status: DC | PRN

## 2015-06-26 NOTE — Telephone Encounter (Signed)
Pt notified, Rx at front office

## 2015-06-26 NOTE — Telephone Encounter (Signed)
Prescription is ready for pickup. 

## 2015-07-27 ENCOUNTER — Encounter (HOSPITAL_COMMUNITY): Payer: Self-pay | Admitting: Emergency Medicine

## 2015-07-27 ENCOUNTER — Emergency Department (HOSPITAL_COMMUNITY)
Admission: EM | Admit: 2015-07-27 | Discharge: 2015-07-27 | Disposition: A | Discharge status: Home / Self Care | Payer: Self-pay | Attending: Emergency Medicine | Admitting: Emergency Medicine

## 2015-07-27 ENCOUNTER — Emergency Department (HOSPITAL_COMMUNITY): Payer: Self-pay

## 2015-07-27 DIAGNOSIS — F329 Major depressive disorder, single episode, unspecified: Secondary | ICD-10-CM | POA: Insufficient documentation

## 2015-07-27 DIAGNOSIS — Z79899 Other long term (current) drug therapy: Secondary | ICD-10-CM | POA: Insufficient documentation

## 2015-07-27 DIAGNOSIS — M797 Fibromyalgia: Secondary | ICD-10-CM | POA: Insufficient documentation

## 2015-07-27 DIAGNOSIS — F17210 Nicotine dependence, cigarettes, uncomplicated: Secondary | ICD-10-CM | POA: Insufficient documentation

## 2015-07-27 DIAGNOSIS — Z8742 Personal history of other diseases of the female genital tract: Secondary | ICD-10-CM | POA: Insufficient documentation

## 2015-07-27 DIAGNOSIS — F419 Anxiety disorder, unspecified: Secondary | ICD-10-CM | POA: Insufficient documentation

## 2015-07-27 DIAGNOSIS — G43909 Migraine, unspecified, not intractable, without status migrainosus: Secondary | ICD-10-CM | POA: Insufficient documentation

## 2015-07-27 DIAGNOSIS — Z8719 Personal history of other diseases of the digestive system: Secondary | ICD-10-CM | POA: Insufficient documentation

## 2015-07-27 DIAGNOSIS — R079 Chest pain, unspecified: Secondary | ICD-10-CM | POA: Insufficient documentation

## 2015-07-27 DIAGNOSIS — Z8679 Personal history of other diseases of the circulatory system: Secondary | ICD-10-CM | POA: Insufficient documentation

## 2015-07-27 DIAGNOSIS — Z8619 Personal history of other infectious and parasitic diseases: Secondary | ICD-10-CM | POA: Insufficient documentation

## 2015-07-27 DIAGNOSIS — Z8673 Personal history of transient ischemic attack (TIA), and cerebral infarction without residual deficits: Secondary | ICD-10-CM | POA: Insufficient documentation

## 2015-07-27 DIAGNOSIS — E669 Obesity, unspecified: Secondary | ICD-10-CM | POA: Insufficient documentation

## 2015-07-27 LAB — CBC
HCT: 43.1 % (ref 36.0–46.0)
Hemoglobin: 14.5 g/dL (ref 12.0–15.0)
MCH: 32.9 pg (ref 26.0–34.0)
MCHC: 33.6 g/dL (ref 30.0–36.0)
MCV: 97.7 fL (ref 78.0–100.0)
Platelets: 267 10*3/uL (ref 150–400)
RBC: 4.41 MIL/uL (ref 3.87–5.11)
RDW: 12.3 % (ref 11.5–15.5)
WBC: 9.9 10*3/uL (ref 4.0–10.5)

## 2015-07-27 LAB — BASIC METABOLIC PANEL
Anion gap: 7 (ref 5–15)
BUN: 7 mg/dL (ref 6–20)
CO2: 25 mmol/L (ref 22–32)
Calcium: 8.7 mg/dL — ABNORMAL LOW (ref 8.9–10.3)
Chloride: 107 mmol/L (ref 101–111)
Creatinine, Ser: 0.8 mg/dL (ref 0.44–1.00)
GFR calc Af Amer: >60 mL/min (ref >60)
GFR calc non Af Amer: >60 mL/min (ref >60)
Glucose, Bld: 99 mg/dL (ref 65–99)
Potassium: 4.1 mmol/L (ref 3.5–5.1)
Sodium: 139 mmol/L (ref 135–145)

## 2015-07-27 LAB — I-STAT TROPONIN, ED: Troponin i, poc: 0 ng/mL (ref 0.00–0.08)

## 2015-07-27 MED ORDER — OXYCODONE-ACETAMINOPHEN 5-325 MG PO TABS
1.0000 | ORAL_TABLET | Freq: Once | ORAL | Status: AC
  Administered 2015-07-27: 1 via ORAL
  Filled 2015-07-27: qty 1

## 2015-07-27 NOTE — Discharge Instructions (Signed)
You were seen and evaluated today for your chest pain.  Follow up with your primary care physician for additional evaluation.  You can also arrange follow up with the cardiologist office if needed.  Make sure you keep your appointment for your mammogram.    Nonspecific Chest Pain  Chest pain can be caused by many different conditions. There is always a chance that your pain could be related to something serious, such as a heart attack or a blood clot in your lungs. Chest pain can also be caused by conditions that are not life-threatening. If you have chest pain, it is very important to follow up with your health care provider. CAUSES  Chest pain can be caused by:  Heartburn.  Pneumonia or bronchitis.  Anxiety or stress.  Inflammation around your heart (pericarditis) or lung (pleuritis or pleurisy).  A blood clot in your lung.  A collapsed lung (pneumothorax). It can develop suddenly on its own (spontaneous pneumothorax) or from trauma to the chest.  Shingles infection (varicella-zoster virus).  Heart attack.  Damage to the bones, muscles, and cartilage that make up your chest wall. This can include:  Bruised bones due to injury.  Strained muscles or cartilage due to frequent or repeated coughing or overwork.  Fracture to one or more ribs.  Sore cartilage due to inflammation (costochondritis). RISK FACTORS  Risk factors for chest pain may include:  Activities that increase your risk for trauma or injury to your chest.  Respiratory infections or conditions that cause frequent coughing.  Medical conditions or overeating that can cause heartburn.  Heart disease or family history of heart disease.  Conditions or health behaviors that increase your risk of developing a blood clot.  Having had chicken pox (varicella zoster). SIGNS AND SYMPTOMS Chest pain can feel like:  Burning or tingling on the surface of your chest or deep in your chest.  Crushing, pressure, aching, or  squeezing pain.  Dull or sharp pain that is worse when you move, cough, or take a deep breath.  Pain that is also felt in your back, neck, shoulder, or arm, or pain that spreads to any of these areas. Your chest pain may come and go, or it may stay constant. DIAGNOSIS Lab tests or other studies may be needed to find the cause of your pain. Your health care provider may have you take a test called an ambulatory ECG (electrocardiogram). An ECG records your heartbeat patterns at the time the test is performed. You may also have other tests, such as:  Transthoracic echocardiogram (TTE). During echocardiography, sound waves are used to create a picture of all of the heart structures and to look at how blood flows through your heart.  Transesophageal echocardiogram (TEE).This is a more advanced imaging test that obtains images from inside your body. It allows your health care provider to see your heart in finer detail.  Cardiac monitoring. This allows your health care provider to monitor your heart rate and rhythm in real time.  Holter monitor. This is a portable device that records your heartbeat and can help to diagnose abnormal heartbeats. It allows your health care provider to track your heart activity for several days, if needed.  Stress tests. These can be done through exercise or by taking medicine that makes your heart beat more quickly.  Blood tests.  Imaging tests. TREATMENT  Your treatment depends on what is causing your chest pain. Treatment may include:  Medicines. These may include:  Acid blockers for heartburn.  Anti-inflammatory medicine.  Pain medicine for inflammatory conditions.  Antibiotic medicine, if an infection is present.  Medicines to dissolve blood clots.  Medicines to treat coronary artery disease.  Supportive care for conditions that do not require medicines. This may include:  Resting.  Applying heat or cold packs to injured areas.  Limiting  activities until pain decreases. HOME CARE INSTRUCTIONS  If you were prescribed an antibiotic medicine, finish it all even if you start to feel better.  Avoid any activities that bring on chest pain.  Do not use any tobacco products, including cigarettes, chewing tobacco, or electronic cigarettes. If you need help quitting, ask your health care provider.  Do not drink alcohol.  Take medicines only as directed by your health care provider.  Keep all follow-up visits as directed by your health care provider. This is important. This includes any further testing if your chest pain does not go away.  If heartburn is the cause for your chest pain, you may be told to keep your head raised (elevated) while sleeping. This reduces the chance that acid will go from your stomach into your esophagus.  Make lifestyle changes as directed by your health care provider. These may include:  Getting regular exercise. Ask your health care provider to suggest some activities that are safe for you.  Eating a heart-healthy diet. A registered dietitian can help you to learn healthy eating options.  Maintaining a healthy weight.  Managing diabetes, if necessary.  Reducing stress. SEEK MEDICAL CARE IF:  Your chest pain does not go away after treatment.  You have a rash with blisters on your chest.  You have a fever. SEEK IMMEDIATE MEDICAL CARE IF:   Your chest pain is worse.  You have an increasing cough, or you cough up blood.  You have severe abdominal pain.  You have severe weakness.  You faint.  You have chills.  You have sudden, unexplained chest discomfort.  You have sudden, unexplained discomfort in your arms, back, neck, or jaw.  You have shortness of breath at any time.  You suddenly start to sweat, or your skin gets clammy.  You feel nauseous or you vomit.  You suddenly feel light-headed or dizzy.  Your heart begins to beat quickly, or it feels like it is skipping  beats. These symptoms may represent a serious problem that is an emergency. Do not wait to see if the symptoms will go away. Get medical help right away. Call your local emergency services (911 in the [place] Do not drive yourself to the hospital.   This information is not intended to replace advice given to you by your health care provider. Make sure you discuss any questions you have with your health care provider.   Document Released: 04/10/2005 Document Revised: 07/22/2014 Document Reviewed: 02/04/2014 Elsevier Interactive Patient Education [employer].

## 2015-07-27 NOTE — ED Provider Notes (Signed)
CSN: [id number]     Arrival date & time 07/27/15  [id number] History   First MD Initiated Contact with Patient 07/27/15 2045     Chief Complaint  Patient presents with  . Chest Pain     (Consider location/radiation/quality/duration/timing/severity/associated sxs/prior Treatment) HPI Comments: 45 y.o. Female with history of fibromyalgia, obesity, DUB, tobacco abuse presents for chest pain.  The patient reports that over the last 3-4 weeks she has had intermittent sharp pain in her right chest just medial to her breast.  She denies fever, chills, cough, nausea, vomiting, shortness of breath.  The patient denies recent travel or leg swelling.  Denies hormone replacement therapy.     Past Medical History  Diagnosis Date  . Abdominal pain, right lower quadrant   . Fibromyalgia   . Migraine   . Trichomonas 2012  . Stroke ([facility])   . Peripheral vascular disease ([facility])     RIGHT LEG  . Anxiety   . Depression   . GERD (gastroesophageal reflux disease)   . PONV (postoperative nausea and vomiting)   . Obesity    Past Surgical History  Procedure Laterality Date  . Cesarean section    . Vein surgery    . Fracture surgery    . Hysteroscopy with novasure N/A 08/08/2014    Procedure:  Rebbeca Paul;  Surgeon: Emily Filbert, MD;  Location: [facility];  Service: Gynecology;  Laterality: N/A;   Family History  Problem Relation Age of Onset  . Other Neg Hx   . Diabetes Mother   . Hypertension Mother   . Diabetes Father   . Hypertension Father   . Diabetes Other   . Hypertension Other    Social History  Substance Use Topics  . Smoking status: Current Every Day Smoker -- 0.25 packs/day for 5 years    Types: Cigarettes  . Smokeless tobacco: Never Used  . Alcohol Use: No   OB History    Gravida Para Term Preterm AB TAB SAB Ectopic Multiple Living   2 2 2       2      Review of Systems  Constitutional: Negative for fever, chills, appetite change and fatigue.  HENT: Negative for congestion, postnasal  drip, rhinorrhea, sinus pressure, sore throat and voice change.   Eyes: Negative for pain and redness.  Respiratory: Negative for cough, chest tightness, shortness of breath and wheezing.   Cardiovascular: Positive for chest pain. Negative for palpitations and leg swelling.  Gastrointestinal: Negative for nausea, vomiting, abdominal pain, diarrhea and constipation.  Genitourinary: Negative for dysuria, urgency, frequency and flank pain.  Musculoskeletal: Negative for myalgias and back pain.  Skin: Negative for rash.  Neurological: Negative for dizziness, weakness, light-headedness and headaches.  Hematological: Does not bruise/bleed easily.      Allergies  Review of patient's allergies indicates no known allergies.  Home Medications   Prior to Admission medications   Medication Sig Start Date End Date Taking? Authorizing Provider  cetirizine (ZYRTEC) 10 MG tablet Take 1 tablet (10 mg total) by mouth daily. 12/29/14  Yes Josalyn Funches, MD  cyclobenzaprine (FLEXERIL) 10 MG tablet Take 1 tablet (10 mg total) by mouth 3 (three) times daily as needed for muscle spasms. 12/29/14  Yes Josalyn Funches, MD  DULoxetine (CYMBALTA) 60 MG capsule Take 1 capsule (60 mg total) by mouth every morning. 11/22/14  Yes Josalyn Funches, MD  fluconazole (DIFLUCAN) 150 MG tablet Take 1 tablet (150 mg total) by mouth once. 05/17/15  Yes Emily Filbert, MD  gabapentin (NEURONTIN) 300 MG capsule Take 1 capsule (300 mg total) by mouth 3 (three) times daily. 03/31/15  Yes Josalyn Funches, MD  ibuprofen (ADVIL,MOTRIN) 600 MG tablet Take 1 tablet (600 mg total) by mouth every 6 (six) hours as needed. Patient taking differently: Take 600 mg by mouth every 6 (six) hours as needed for moderate pain.  08/08/14  Yes Myra Marijo Sanes, MD  lurasidone (LATUDA) 40 MG TABS tablet Take 40 mg by mouth at bedtime.    Yes Historical Provider, MD  prazosin (MINIPRESS) 2 MG capsule Take 2 mg by mouth at bedtime.   Yes Historical Provider, MD    acetaminophen-codeine (TYLENOL #3) 300-30 MG tablet Take 1 tablet by mouth every 8 (eight) hours as needed. Patient not taking: Reported on 07/27/2015 06/26/15   Arnoldo Morale, MD  metroNIDAZOLE (FLAGYL) 500 MG tablet Take 1 tablet (500 mg total) by mouth 2 (two) times daily. Patient not taking: Reported on 07/27/2015 05/22/15   Emily Filbert, MD  Vitamin D, Ergocalciferol, (DRISDOL) 50000 UNITS CAPS capsule Take 1 capsule (50,000 Units total) by mouth every 7 (seven) days. For 12 total weeks Patient not taking: Reported on 07/27/2015 11/23/14   Josalyn Funches, MD   BP 114/76 mmHg  Pulse 92  Temp(Src) 98.3 F (36.8 C) (Oral)  Resp 17  SpO2 100% Physical Exam  Constitutional: She is oriented to person, place, and time. She appears well-developed and well-nourished. No distress.  HENT:  Head: Normocephalic and atraumatic.  Right Ear: External ear normal.  Left Ear: External ear normal.  Nose: Nose normal.  Mouth/Throat: Oropharynx is clear and moist. No oropharyngeal exudate.  Eyes: EOM are normal. Pupils are equal, round, and reactive to light.  Neck: Normal range of motion. Neck supple.  Cardiovascular: Normal rate, regular rhythm, normal heart sounds and intact distal pulses.   No murmur heard. Pulmonary/Chest: Effort normal. No respiratory distress. She has no wheezes. She has no rales. She exhibits tenderness. She exhibits no mass. Right breast exhibits no inverted nipple, no mass, no nipple discharge, no skin change and no tenderness. Left breast exhibits tenderness. Left breast exhibits no inverted nipple, no mass, no nipple discharge and no skin change. Breasts are symmetrical.    Abdominal: Soft. She exhibits no distension. There is no tenderness.  Musculoskeletal: Normal range of motion. She exhibits no edema or tenderness.  Neurological: She is alert and oriented to person, place, and time.  Skin: Skin is warm and dry. No rash noted. She is not diaphoretic.  Vitals  reviewed.   ED Course  Procedures (including critical care time) Labs Review Labs Reviewed  BASIC METABOLIC PANEL - Abnormal; Notable for the following:    Calcium 8.7 (*)    All other components within normal limits  CBC  I-STAT TROPOININ, ED    Imaging Review Dg Chest 2 View  07/27/2015  CLINICAL DATA:  Mid chest pain and shortness of breath for 3 weeks EXAM: CHEST - 2 VIEW COMPARISON:  None. FINDINGS: The heart size and mediastinal contours are within normal limits. Both lungs are clear. The visualized skeletal structures are unremarkable. IMPRESSION: No active disease. Electronically Signed   By: Inez Catalina M.D.   On: 07/27/2015 10:17   I have personally reviewed and evaluated these images and lab results as part of my medical decision-making.   EKG Interpretation   Date/Time:  Thursday July 27 2015 09:52:36 EST Ventricular Rate:  105 PR Interval:  150 QRS Duration: 72 QT Interval:  334  QTC Calculation: 441 R Axis:   65 Text Interpretation:  Sinus tachycardia Otherwise normal ECG Confirmed by  Wilson Singer  MD, STEPHEN ([id number]) on 07/27/2015 10:45:44 AM      MDM  Patient was seen and evaluated in stable condition.  HR normalized without intervention.  Wells criteria low risk and PERC negative.  Heart Score 1.  Patient with reproducible pain on palpation of chest.  No sign of acute process at this time.  Patient has a scheduled mammogram for the end of this month.  Patient was discharged home in stable condition with instruction to follow up with her PCP and to keep her appointment for the mammogram. Final diagnoses:  Chest pain, unspecified chest pain type    1. Chest pain    Harvel Quale, MD 07/28/15 [id number]

## 2015-08-07 ENCOUNTER — Ambulatory Visit: Payer: Self-pay | Admitting: Obstetrics & Gynecology

## 2015-08-07 ENCOUNTER — Ambulatory Visit: Payer: Self-pay

## 2015-08-08 ENCOUNTER — Ambulatory Visit: Payer: Self-pay | Admitting: Family

## 2015-08-16 ENCOUNTER — Ambulatory Visit: Payer: Self-pay

## 2015-09-01 ENCOUNTER — Ambulatory Visit: Payer: Self-pay | Admitting: Obstetrics & Gynecology

## 2015-09-01 ENCOUNTER — Telehealth: Payer: Self-pay | Admitting: *Deleted

## 2015-09-01 DIAGNOSIS — N760 Acute vaginitis: Principal | ICD-10-CM

## 2015-09-01 DIAGNOSIS — B9689 Other specified bacterial agents as the cause of diseases classified elsewhere: Secondary | ICD-10-CM

## 2015-09-01 MED ORDER — METRONIDAZOLE 500 MG PO TABS: 500.0000 mg | ORAL_TABLET | Freq: Two times a day (BID) | ORAL | Status: DC

## 2015-09-01 MED FILL — metroNIDAZOLE 500 MG TABS: 500 | 7 days supply | Qty: 14 | Fill #0

## 2015-09-01 NOTE — Telephone Encounter (Signed)
Called patient in regards to her appointment today. Patient states that she has bacterial vaginosis, she has had it many times. Advised patient that I can send in rx to her pharmacy and if symptoms persist she needs to reschedule followup.

## 2015-10-09 ENCOUNTER — Telehealth: Payer: Self-pay | Admitting: *Deleted

## 2015-10-09 ENCOUNTER — Other Ambulatory Visit: Payer: Self-pay | Admitting: Family Medicine

## 2015-10-09 DIAGNOSIS — M4802 Spinal stenosis, cervical region: Secondary | ICD-10-CM

## 2015-10-09 NOTE — Telephone Encounter (Signed)
Patient verified DOB Patient is aware of Tylenol prescription being placed at the front desk for pickup Patient expressed her understanding and had no further questions at this time.

## 2015-10-11 MED FILL — ACETAMINOPHEN/COD #3 TABLET: 300-30 | 20 days supply | Qty: 60 | Fill #0

## 2015-10-16 ENCOUNTER — Encounter: Payer: Self-pay | Admitting: Family Medicine

## 2015-10-16 ENCOUNTER — Encounter: Payer: Self-pay | Admitting: Clinical

## 2015-10-16 ENCOUNTER — Ambulatory Visit: Payer: Self-pay | Attending: Family Medicine | Admitting: Family Medicine

## 2015-10-16 VITALS — BP 94/64 | HR 81 | Temp 98.4°F | Resp 16 | Ht 63.0 in | Wt 190.0 lb

## 2015-10-16 DIAGNOSIS — R29898 Other symptoms and signs involving the musculoskeletal system: Secondary | ICD-10-CM

## 2015-10-16 DIAGNOSIS — F17210 Nicotine dependence, cigarettes, uncomplicated: Secondary | ICD-10-CM | POA: Insufficient documentation

## 2015-10-16 DIAGNOSIS — N898 Other specified noninflammatory disorders of vagina: Secondary | ICD-10-CM | POA: Insufficient documentation

## 2015-10-16 DIAGNOSIS — Z79899 Other long term (current) drug therapy: Secondary | ICD-10-CM | POA: Insufficient documentation

## 2015-10-16 DIAGNOSIS — Z7251 High risk heterosexual behavior: Secondary | ICD-10-CM

## 2015-10-16 DIAGNOSIS — R531 Weakness: Secondary | ICD-10-CM | POA: Insufficient documentation

## 2015-10-16 DIAGNOSIS — M4802 Spinal stenosis, cervical region: Secondary | ICD-10-CM | POA: Insufficient documentation

## 2015-10-16 DIAGNOSIS — E559 Vitamin D deficiency, unspecified: Secondary | ICD-10-CM | POA: Insufficient documentation

## 2015-10-16 DIAGNOSIS — M797 Fibromyalgia: Secondary | ICD-10-CM | POA: Insufficient documentation

## 2015-10-16 DIAGNOSIS — E538 Deficiency of other specified B group vitamins: Secondary | ICD-10-CM

## 2015-10-16 LAB — HIV ANTIBODY (ROUTINE TESTING W REFLEX): HIV 1&2 Ab, 4th Generation: NONREACTIVE

## 2015-10-16 LAB — VITAMIN B12: Vitamin B-12: 190 pg/mL — ABNORMAL LOW (ref 200–1100)

## 2015-10-16 MED ORDER — METRONIDAZOLE 0.75 % VA GEL: 1.0000 | Freq: Every day | VAGINAL | Status: DC

## 2015-10-16 MED ORDER — FLUCONAZOLE 150 MG PO TABS: 150.0000 mg | ORAL_TABLET | Freq: Once | ORAL | Status: DC

## 2015-10-16 MED ORDER — DULOXETINE HCL 60 MG PO CPEP: 60.0000 mg | ORAL_CAPSULE | Freq: Every morning | ORAL | Status: DC

## 2015-10-16 MED ORDER — CYCLOBENZAPRINE HCL 10 MG PO TABS: 10.0000 mg | ORAL_TABLET | Freq: Three times a day (TID) | ORAL | Status: DC | PRN

## 2015-10-16 MED FILL — ?CYCLOBENZAPRINE 10 MG TABL: 10 | 10 days supply | Qty: 30 | Fill #0

## 2015-10-16 MED FILL — ?CETIRIZINE HCL 10 MG TABLE: 10 | 30 days supply | Qty: 30 | Fill #2

## 2015-10-16 MED FILL — FLUCONAZOLE 150 MG TABLET: 150 | 1 days supply | Qty: 1 | Fill #0

## 2015-10-16 MED FILL — DULoxetine HCL 60 MG CPEP: 60 | 30 days supply | Qty: 30 | Fill #0

## 2015-10-16 MED FILL — VANDAZOLE VAGINAL 0.75% GEL: 0.75 | 7 days supply | Qty: 70 | Fill #0

## 2015-10-16 NOTE — Assessment & Plan Note (Signed)
Suspect BV + yeast metrogel followed by diflucan

## 2015-10-16 NOTE — Patient Instructions (Addendum)
Lenell was seen today for fibromyalgia.  Diagnoses and all orders for this visit:  Unprotected sexual intercourse -     Cervicovaginal ancillary only -     HIV antibody (with reflex) -     RPR  Vitamin D deficiency -     Vitamin D, 25-hydroxy  Weakness of both lower extremities -     Vitamin B12  Vaginal discharge -     fluconazole (DIFLUCAN) 150 MG tablet; Take 1 tablet (150 mg total) by mouth once. -     metroNIDAZOLE (METROGEL VAGINAL) 0.75 % vaginal gel; Place 1 Applicatorful vaginally at bedtime. For 5 days  Fibromyalgia -     Ambulatory referral to Pain Clinic -     DULoxetine (CYMBALTA) 60 MG capsule; Take 1 capsule (60 mg total) by mouth every morning. -     cyclobenzaprine (FLEXERIL) 10 MG tablet; Take 1 tablet (10 mg total) by mouth 3 (three) times daily as needed for muscle spasms.  Spinal stenosis in cervical region -     Ambulatory referral to Pain Clinic   Your results will be sent your mychart  F/u in 6 months, sooner if needed  Dr. Adrian Blackwater

## 2015-10-16 NOTE — Assessment & Plan Note (Signed)
Chronic and stable Referral to pain management

## 2015-10-16 NOTE — Progress Notes (Signed)
Subjective:  Patient ID: Caitlin Valdez, female    DOB: March 16, 1971  Age: 45 y.o. MRN: [id number]  CC: Fibromyalgia   HPI [name] presents for   1. Fibromyalgia: still with pain in upper back, medial knees. Ankles. Over past month has experienced weakness in ankles with joints giving out. No swelling or rash. She is not taking ibuprofen or gabapentin. Reports tylenol #3 is not a strong enough pain medicine. She is eating healthy and working out. Working to lose weight.   2. Unprotected sex: her partner removed the condom during last sexual encounter. She denies vaginal lesions  or pelvic pain. She admits scant vaginal discharge.   Social History  Substance Use Topics  . Smoking status: Current Every Day Smoker -- 0.25 packs/day for 5 years    Types: Cigarettes  . Smokeless tobacco: Never Used  . Alcohol Use: No    Outpatient Prescriptions Prior to Visit  Medication Sig Dispense Refill  . acetaminophen-codeine (TYLENOL #3) 300-30 MG tablet TAKE 1 TABLET BY MOUTH EVERY 8 HOURS AS NEEDED 60 tablet 0  . cetirizine (ZYRTEC) 10 MG tablet Take 1 tablet (10 mg total) by mouth daily. 30 tablet 11  . cyclobenzaprine (FLEXERIL) 10 MG tablet Take 1 tablet (10 mg total) by mouth 3 (three) times daily as needed for muscle spasms. 30 tablet 3  . DULoxetine (CYMBALTA) 60 MG capsule Take 1 capsule (60 mg total) by mouth every morning. 30 capsule 5  . ibuprofen (ADVIL,MOTRIN) 600 MG tablet Take 1 tablet (600 mg total) by mouth every 6 (six) hours as needed. (Patient taking differently: Take 600 mg by mouth every 6 (six) hours as needed for moderate pain. ) 30 tablet 1  . lurasidone (LATUDA) 40 MG TABS tablet Take 40 mg by mouth at bedtime.     . prazosin (MINIPRESS) 2 MG capsule Take 2 mg by mouth at bedtime.    . fluconazole (DIFLUCAN) 150 MG tablet Take 1 tablet (150 mg total) by mouth once. (Patient not taking: Reported on 10/16/2015) 1 tablet 3  . gabapentin (NEURONTIN) 300 MG capsule Take 1  capsule (300 mg total) by mouth 3 (three) times daily. (Patient not taking: Reported on 10/16/2015) 90 capsule 3  . metroNIDAZOLE (FLAGYL) 500 MG tablet Take 1 tablet (500 mg total) by mouth 2 (two) times daily. (Patient not taking: Reported on 10/16/2015) 14 tablet 0  . Vitamin D, Ergocalciferol, (DRISDOL) 50000 UNITS CAPS capsule Take 1 capsule (50,000 Units total) by mouth every 7 (seven) days. For 12 total weeks (Patient not taking: Reported on 10/16/2015) 4 capsule 2   No facility-administered medications prior to visit.    ROS Review of Systems  Constitutional: Negative for fever and chills.  Eyes: Negative for visual disturbance.  Respiratory: Negative for shortness of breath.   Cardiovascular: Negative for chest pain.  Gastrointestinal: Negative for abdominal pain and blood in stool.  Genitourinary: Positive for vaginal discharge.  Musculoskeletal: Positive for myalgias. Negative for back pain and arthralgias.  Skin: Negative for rash.  Allergic/Immunologic: Negative for immunocompromised state.  Neurological: Positive for numbness.  Hematological: Negative for adenopathy. Does not bruise/bleed easily.  Psychiatric/Behavioral: Negative for suicidal ideas and dysphoric mood.    Objective:  BP 94/64 mmHg  Pulse 81  Temp(Src) 98.4 F (36.9 C) (Oral)  Resp 16  Ht 5\' 3"  (1.6 m)  Wt 190 lb (86.183 kg)  BMI 33.67 kg/m2  SpO2 98%  BP/Weight 10/16/2015 07/27/2015 123XX123  Systolic BP 94 99991111 123XX123  Diastolic BP 64 76 74  Wt. (Lbs) 190 - 185  BMI 33.67 - 32.78   Physical Exam  Constitutional: She is oriented to person, place, and time. She appears well-developed and well-nourished. No distress.  HENT:  Head: Normocephalic and atraumatic.  Cardiovascular: Normal rate, regular rhythm, normal heart sounds and intact distal pulses.   Pulmonary/Chest: Effort normal and breath sounds normal.  Genitourinary: Vagina normal and uterus normal. Pelvic exam was performed with patient prone.  There is no rash, tenderness or lesion on the right labia. There is no rash, tenderness or lesion on the left labia. Cervix exhibits discharge (homogenous white vaginal discharge with mild odor ). Cervix exhibits no motion tenderness and no friability.  Musculoskeletal: She exhibits tenderness. She exhibits no edema.       Cervical back: She exhibits tenderness.       Legs: Lymphadenopathy:       Right: No inguinal adenopathy present.       Left: No inguinal adenopathy present.  Neurological: She is alert and oriented to person, place, and time.  Skin: Skin is warm and dry. No rash noted.  Psychiatric: She has a normal mood and affect.     Assessment & Plan:   There are no diagnoses linked to this encounter. [name] was seen today for fibromyalgia.  Diagnoses and all orders for this visit:  Unprotected sexual intercourse -     Cervicovaginal ancillary only -     HIV antibody (with reflex) -     RPR  Vitamin D deficiency -     Vitamin D, 25-hydroxy  Weakness of both lower extremities -     Vitamin B12  Vaginal discharge -     fluconazole (DIFLUCAN) 150 MG tablet; Take 1 tablet (150 mg total) by mouth once. -     metroNIDAZOLE (METROGEL VAGINAL) 0.75 % vaginal gel; Place 1 Applicatorful vaginally at bedtime. For 5 days  Fibromyalgia -     Ambulatory referral to Pain Clinic -     DULoxetine (CYMBALTA) 60 MG capsule; Take 1 capsule (60 mg total) by mouth every morning. -     cyclobenzaprine (FLEXERIL) 10 MG tablet; Take 1 tablet (10 mg total) by mouth 3 (three) times daily as needed for muscle spasms.  Spinal stenosis in cervical region -     Ambulatory referral to Pain Clinic   Meds ordered this encounter  Medications  . fluconazole (DIFLUCAN) 150 MG tablet    Sig: Take 1 tablet (150 mg total) by mouth once.    Dispense:  1 tablet    Refill:  0  . metroNIDAZOLE (METROGEL VAGINAL) 0.75 % vaginal gel    Sig: Place 1 Applicatorful vaginally at bedtime. For 5 days     Dispense:  70 g    Refill:  0  . DULoxetine (CYMBALTA) 60 MG capsule    Sig: Take 1 capsule (60 mg total) by mouth every morning.    Dispense:  30 capsule    Refill:  5  . cyclobenzaprine (FLEXERIL) 10 MG tablet    Sig: Take 1 tablet (10 mg total) by mouth 3 (three) times daily as needed for muscle spasms.    Dispense:  30 tablet    Refill:  5    Follow-up: No Follow-up on file.   Boykin Nearing MD

## 2015-10-16 NOTE — Progress Notes (Signed)
F/U fibromyalgia Vagina discharge clear no odor  Pain scale # 8  Tobacco user 1ppday Suicidal thoughts in the past two weeks

## 2015-10-16 NOTE — Progress Notes (Signed)
Depression screen [facility] 2/9 10/16/2015 12/29/2014 11/22/2014  Decreased Interest 0 0 0  Down, Depressed, Hopeless 1 0 0  PHQ - 2 Score 1 0 0  Altered sleeping 1 - -  Tired, decreased energy 1 - -  Change in appetite 1 - -  Feeling bad or failure about yourself  1 - -  Trouble concentrating 1 - -  Moving slowly or fidgety/restless 0 - -  Suicidal thoughts 0 - -  PHQ-9 Score 6 - -    GAD 7 : Generalized Anxiety Score 10/16/2015  Nervous, Anxious, on Edge 1  Control/stop worrying 1  Worry too much - different things 1  Trouble relaxing 1  Easily annoyed or irritable 1  Afraid - awful might happen 1

## 2015-10-17 DIAGNOSIS — E538 Deficiency of other specified B group vitamins: Secondary | ICD-10-CM | POA: Insufficient documentation

## 2015-10-17 LAB — VITAMIN D 25 HYDROXY (VIT D DEFICIENCY, FRACTURES): Vit D, 25-Hydroxy: 14 ng/mL — ABNORMAL LOW (ref 30–100)

## 2015-10-17 LAB — CERVICOVAGINAL ANCILLARY ONLY
Chlamydia: NEGATIVE
Neisseria Gonorrhea: NEGATIVE
Wet Prep (BD Affirm): POSITIVE — AB

## 2015-10-17 LAB — RPR

## 2015-10-17 MED ORDER — VITAMIN B-12 1000 MCG PO TABS: 1000.0000 ug | ORAL_TABLET | Freq: Every day | ORAL | Status: DC

## 2015-10-17 MED ORDER — VITAMIN D (ERGOCALCIFEROL) 1.25 MG (50000 UNIT) PO CAPS: 50000.0000 [IU] | ORAL_CAPSULE | ORAL | Status: DC

## 2015-10-17 MED FILL — VIT D2 1.25 MG (50,000 UNIT: 1.25 MG | 28 days supply | Qty: 4 | Fill #0

## 2015-10-17 NOTE — Addendum Note (Signed)
Addended by: Boykin Nearing on: 10/17/2015 08:10 AM   Modules accepted: Orders

## 2015-12-08 ENCOUNTER — Telehealth: Payer: Self-pay | Admitting: Family Medicine

## 2015-12-08 DIAGNOSIS — N898 Other specified noninflammatory disorders of vagina: Secondary | ICD-10-CM

## 2015-12-08 DIAGNOSIS — M4802 Spinal stenosis, cervical region: Secondary | ICD-10-CM

## 2015-12-08 NOTE — Telephone Encounter (Signed)
Pt. Called requesting a refill on the following medications:  metroNIDAZOLE (METROGEL VAGINAL) 0.75 % vaginal gel  acetaminophen-codeine (TYLENOL #3) 300-30 MG tablet  Please f/u with pt.

## 2015-12-12 MED ORDER — METRONIDAZOLE 0.75 % VA GEL: 1.0000 | Freq: Every day | VAGINAL | Status: DC

## 2015-12-12 MED ORDER — ACETAMINOPHEN-CODEINE #3 300-30 MG PO TABS: 1.0000 | ORAL_TABLET | Freq: Three times a day (TID) | ORAL | Status: DC | PRN

## 2015-12-12 MED FILL — VANDAZOLE VAGINAL 0.75% GEL: 0.75 | 5 days supply | Qty: 70 | Fill #0

## 2015-12-12 NOTE — Telephone Encounter (Signed)
metrogel refilled Patient to come in to pick up tylenol #3 and signed controlled substance contract

## 2015-12-13 MED FILL — ACETAMINOPHEN/COD #3 TABLET: 300-30 | 20 days supply | Qty: 60 | Fill #0

## 2015-12-13 NOTE — Telephone Encounter (Signed)
LVM Rx at front office ready to be pickup 

## 2016-01-10 ENCOUNTER — Other Ambulatory Visit: Payer: Self-pay | Admitting: Family Medicine

## 2016-01-10 DIAGNOSIS — N898 Other specified noninflammatory disorders of vagina: Secondary | ICD-10-CM

## 2016-01-10 NOTE — Telephone Encounter (Signed)
I cannot refill this - will forward the request to Dr. Adrian Blackwater

## 2016-01-10 NOTE — Telephone Encounter (Signed)
Patient called requesting medication refill on metroNIDAZOLE (METROGEL VAGINAL) 0.75 % vaginal gel , please f/up

## 2016-01-12 MED ORDER — METRONIDAZOLE 0.75 % VA GEL: 1.0000 | Freq: Every day | VAGINAL | Status: DC

## 2016-01-22 MED FILL — VANDAZOLE VAGINAL 0.75% GEL: 0.75 | 5 days supply | Qty: 70 | Fill #0

## 2016-02-09 ENCOUNTER — Encounter: Payer: Self-pay | Admitting: Family Medicine

## 2016-02-09 ENCOUNTER — Ambulatory Visit: Payer: Self-pay | Attending: Family Medicine | Admitting: Family Medicine

## 2016-02-09 VITALS — BP 105/71 | HR 107 | Temp 98.2°F | Resp 17 | Ht 63.0 in | Wt 190.4 lb

## 2016-02-09 DIAGNOSIS — Z113 Encounter for screening for infections with a predominantly sexual mode of transmission: Secondary | ICD-10-CM

## 2016-02-09 DIAGNOSIS — M654 Radial styloid tenosynovitis [de Quervain]: Secondary | ICD-10-CM | POA: Insufficient documentation

## 2016-02-09 DIAGNOSIS — E559 Vitamin D deficiency, unspecified: Secondary | ICD-10-CM

## 2016-02-09 DIAGNOSIS — Z720 Tobacco use: Secondary | ICD-10-CM

## 2016-02-09 DIAGNOSIS — M4802 Spinal stenosis, cervical region: Secondary | ICD-10-CM

## 2016-02-09 DIAGNOSIS — F17200 Nicotine dependence, unspecified, uncomplicated: Secondary | ICD-10-CM

## 2016-02-09 DIAGNOSIS — E538 Deficiency of other specified B group vitamins: Secondary | ICD-10-CM

## 2016-02-09 DIAGNOSIS — Z1231 Encounter for screening mammogram for malignant neoplasm of breast: Secondary | ICD-10-CM

## 2016-02-09 MED ORDER — ACETAMINOPHEN-CODEINE #3 300-30 MG PO TABS: 1.0000 | ORAL_TABLET | Freq: Three times a day (TID) | ORAL | 2 refills | Status: DC | PRN

## 2016-02-09 MED ORDER — NICOTINE 7 MG/24HR TD PT24: 7.0000 mg | MEDICATED_PATCH | Freq: Every day | TRANSDERMAL | 0 refills | Status: DC

## 2016-02-09 MED ORDER — NICOTINE 14 MG/24HR TD PT24: 14.0000 mg | MEDICATED_PATCH | Freq: Every day | TRANSDERMAL | 0 refills | Status: DC

## 2016-02-09 MED ORDER — NICOTINE 21 MG/24HR TD PT24: 21.0000 mg | MEDICATED_PATCH | Freq: Every day | TRANSDERMAL | 0 refills | Status: DC

## 2016-02-09 MED FILL — ACETAMINOPHEN/COD #3 TABLET: 300-30 | 20 days supply | Qty: 60 | Fill #0

## 2016-02-09 NOTE — Progress Notes (Signed)
Patient says she has a lump in right breast, patient has reached out to the breast center. Patient also states that the nerves in her left wrist are jumping. Pain also in left thigh.  Patient want to know if she can have a STD and HIV test.  Patient is ready to quit smoking.

## 2016-02-09 NOTE — Patient Instructions (Addendum)
Caitlin Valdez was seen today for follow-up.  Diagnoses and all orders for this visit:  Routine screening for STI (sexually transmitted infection) -     Urine cytology ancillary only -     HIV antibody (with reflex)  Vitamin B12 deficiency -     Vitamin B12  Vaginal discharge  Vitamin D deficiency -     Vitamin D, 25-hydroxy  Current smoker -     nicotine (NICODERM CQ - DOSED IN MG/24 HR) 7 mg/24hr patch; Place 1 patch (7 mg total) onto the skin daily. -     nicotine (NICODERM CQ - DOSED IN MG/24 HOURS) 21 mg/24hr patch; Place 1 patch (21 mg total) onto the skin daily. For 6 weeks -     nicotine (NICODERM CQ - DOSED IN MG/24 HOURS) 14 mg/24hr patch; Place 1 patch (14 mg total) onto the skin daily.  Spinal stenosis in cervical region -     acetaminophen-codeine (TYLENOL #3) 300-30 MG tablet; Take 1 tablet by mouth every 8 (eight) hours as needed.  Visit for screening mammogram -     MM DIGITAL SCREENING BILATERAL; Future  21 mg for 6 weeks 14 mg for 2 weeks 7 mg for 2 week Smoking cessation support: smoking cessation hotline: 1-800-QUIT-NOW.  Smoking cessation classes are available through [facility]. Call [phone] or visit our website at [email or web address]   F/u in 3 month for smoking cessation    [name] Disease [name] disease is inflammation of the tendon on the thumb side of the wrist. Tendons are cords of tissue that connect bones to muscles. The tendons in your hand pass through a tunnel, or sheath. A slippery layer of tissue (synovium) lets the tendons move smoothly in the sheath. With de Quervain disease, the sheath swells or thickens, causing friction and pain. The condition is also called de Quervain tendinosis and de Quervain syndrome. It occurs most often in women who are 46-12 years old. CAUSES  The exact cause of de Quervain disease is not known. It may result from:   Overusing your hands, especially with repetitive motions that  involve twisting your hand or using a forceful grip.  Pregnancy.  Rheumatoid disease. RISK FACTORS You may have a greater risk for de Quervain disease if you:  Are a middle-aged woman.  Are pregnant.  Have rheumatoid arthritis.  Have diabetes.  Use your hands far more than normal, especially with a tight grip or excessive twisting. SIGNS AND SYMPTOMS Pain on the thumb side of your wrist is the main symptom of de Quervain disease. Other signs and symptoms include:  Pain that gets worse when you grasp something or turn your wrist.  Pain that extends up the forearm.  Cysts in the area of the pain.  Swelling of your wrist and hand.  A sensation of snapping in the wrist.  Trouble moving the thumb and wrist. DIAGNOSIS  Your health care provider may diagnose de Quervain disease based on your signs and symptoms. A physical exam will also be done. A simple test Wynn Maudlin test) that involves pulling your thumb and wrist to see if this causes pain can help determine whether you have the condition. Sometimes you may need to have an X-ray.  TREATMENT  Avoiding any activity that causes pain and swelling is the best treatment. Other options include:  Wearing a splint.  Taking medicine. Anti-inflammatory medicines and corticosteroid injections may reduce inflammation and relieve pain.  Having surgery if other treatments do not  work. HOME CARE INSTRUCTIONS   Using ice can be helpful after doing activities that involve the sore wrist. To apply ice to the injured area:  Put ice in a plastic bag.  Place a towel between your skin and the bag.  Leave the ice on for 20 minutes, 2-3 times a day.  Take medicines only as directed by your health care provider.  Wear your splint as directed. This will allow your hand to rest and heal. SEEK MEDICAL CARE IF:   Your pain medicine does not help.   Your pain gets worse.  You develop new symptoms. MAKE SURE YOU:   Understand these  instructions.  Will watch your condition.  Will get help right away if you are not doing well or get worse.   This information is not intended to replace advice given to you by your health care provider. Make sure you discuss any questions you have with your health care provider.   Document Released: 03/26/2001 Document Revised: 07/22/2014 Document Reviewed: 11/03/2013 Elsevier Interactive Patient Education [employer].  Dr. Adrian Blackwater

## 2016-02-09 NOTE — Progress Notes (Signed)
Subjective:  Patient ID: Caitlin Valdez, female    DOB: [birth year]  Age: 45 y.o. MRN: [id number]  CC: Follow-up   HPI Caitlin Valdez has hx of chronic pain in her neck she  presents for    1. L radial wrist pain: for past 3 weeks. No recent injury. Slight swelling. Pain radiates up a bit up forearm.   2. Neck pain: this is chronic since 2014.  She request tylenol #3 refill.   MRI cervical spine  IMPRESSION: 1. 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 Moderatemultifactorial spinal stenosis with spinal cord mass effect at C6-C7. No cord signal abnormality. Mild spinal stenosis at the other 2 levels. 2. Multifactorial moderate bilateral C6 and severe bilateral C7 neural foraminal stenosis.  3. Smoking: she desires to quit. She is smoking 1 PPD.   4. Breast lump: R mid upper breast. Occurred a few weeks ago with tenderness. Lump has resolved. She had a normal screening mammogram on 08/02/14. No skin change or nipple discharge.   5. STI screening: she request STI screening. She was sexually active with one partner in the recent past. She has hx of BV and some ongoing vaginal discharge. She has no pelvic pain or sores.   Past Surgical History:  Procedure Laterality Date  . CESAREAN SECTION    . FRACTURE SURGERY    . HYSTEROSCOPY WITH NOVASURE N/A 08/08/2014   Procedure:  NOVASURE;  Surgeon: Caitlin Filbert, MD;  Location: [facility];  Service: Gynecology;  Laterality: N/A;  . VEIN SURGERY        Social History  Substance Use Topics  . Smoking status: Current Every Day Smoker    Packs/day: 0.25    Years: 5.00    Types: Cigarettes  . Smokeless tobacco: Never Used  . Alcohol use No    Outpatient Medications Prior to Visit  Medication Sig Dispense Refill  . acetaminophen-codeine (TYLENOL #3) 300-30 MG tablet Take 1 tablet by mouth every 8 (eight) hours as needed. 60 tablet 0  . cetirizine (ZYRTEC) 10 MG tablet Take 1 tablet (10 mg total) by mouth daily. 30 tablet 11  .  cyclobenzaprine (FLEXERIL) 10 MG tablet Take 1 tablet (10 mg total) by mouth 3 (three) times daily as needed for muscle spasms. 30 tablet 5  . DULoxetine (CYMBALTA) 60 MG capsule Take 1 capsule (60 mg total) by mouth every morning. 30 capsule 5  . fluconazole (DIFLUCAN) 150 MG tablet Take 1 tablet (150 mg total) by mouth once. 1 tablet 0  . lurasidone (LATUDA) 40 MG TABS tablet Take 40 mg by mouth at bedtime.     . metroNIDAZOLE (METROGEL VAGINAL) 0.75 % vaginal gel Place 1 Applicatorful vaginally at bedtime. For 5 days 70 g 0  . prazosin (MINIPRESS) 2 MG capsule Take 2 mg by mouth at bedtime.    . vitamin B-12 (CYANOCOBALAMIN) 1000 MCG tablet Take 1 tablet (1,000 mcg total) by mouth daily. 30 tablet 11  . Vitamin D, Ergocalciferol, (DRISDOL) 50000 units CAPS capsule Take 1 capsule (50,000 Units total) by mouth every 7 (seven) days. For 12 total weeks 4 capsule 2   No facility-administered medications prior to visit.     ROS Review of Systems  Constitutional: Negative for chills and fever.  Eyes: Negative for visual disturbance.  Respiratory: Negative for shortness of breath.   Cardiovascular: Negative for chest pain.  Gastrointestinal: Negative for abdominal pain and blood in stool.  Genitourinary: Positive for vaginal discharge.  Musculoskeletal: Positive  for arthralgias and myalgias. Negative for back pain.  Skin: Negative for rash.  Allergic/Immunologic: Negative for immunocompromised state.  Neurological: Positive for numbness.  Hematological: Negative for adenopathy. Does not bruise/bleed easily.  Psychiatric/Behavioral: Negative for dysphoric mood and suicidal ideas.    Objective:  BP 105/71 (BP Location: Right Arm, Patient Position: Sitting, Cuff Size: Large)   Pulse (!) 107   Temp 98.2 F (36.8 C) (Oral)   Resp 17   Ht 5\' 3"  (1.6 m)   Wt 190 lb 6.4 oz (86.4 kg)   SpO2 99%   BMI 33.73 kg/m   BP/Weight 02/09/2016 10/16/2015 [id number]  Systolic BP [id number] 94 [id number]  Diastolic BP  71 64 76  Wt. (Lbs) 190.4 190 -  BMI 33.73 33.67 -    Physical Exam  Constitutional: She is oriented to person, place, and time. She appears well-developed and well-nourished. No distress.  HENT:  Head: Normocephalic and atraumatic.  Cardiovascular: Normal rate, regular rhythm, normal heart sounds and intact distal pulses.   Pulmonary/Chest: Effort normal and breath sounds normal. Right breast exhibits no inverted nipple, no mass, no nipple discharge, no skin change and no tenderness. Left breast exhibits no inverted nipple, no mass, no nipple discharge, no skin change and no tenderness. Breasts are symmetrical.  Musculoskeletal: She exhibits no edema.       Left wrist: She exhibits tenderness and swelling. She exhibits normal range of motion, no bony tenderness, no crepitus, no deformity and no laceration.       Arms: Neurological: She is alert and oriented to person, place, and time.  Skin: Skin is warm and dry. No rash noted.  Psychiatric: She has a normal mood and affect.    Assessment & Plan:  Caitlin Valdez was seen today for follow-up.  Diagnoses and all orders for this visit:  Routine screening for STI (sexually transmitted infection) -     Urine cytology ancillary only -     HIV antibody (with reflex)  Vitamin B12 deficiency -     Vitamin B12  Vitamin D deficiency -     Vitamin D, 25-hydroxy  Current smoker -     nicotine (NICODERM CQ - DOSED IN MG/24 HR) 7 mg/24hr patch; Place 1 patch (7 mg total) onto the skin daily. -     nicotine (NICODERM CQ - DOSED IN MG/24 HOURS) 21 mg/24hr patch; Place 1 patch (21 mg total) onto the skin daily. For 6 weeks -     nicotine (NICODERM CQ - DOSED IN MG/24 HOURS) 14 mg/24hr patch; Place 1 patch (14 mg total) onto the skin daily.  Spinal stenosis in cervical region -     acetaminophen-codeine (TYLENOL #3) 300-30 MG tablet; Take 1 tablet by mouth every 8 (eight) hours as needed.  Visit for screening mammogram -     MM DIGITAL SCREENING  BILATERAL; Future  De Quervain's tenosynovitis, left  Vitamin D insufficiency -     Cholecalciferol (VITAMIN D3) 2000 units TABS; Take 2,000 Units by mouth daily.   There are no diagnoses linked to this encounter.  No orders of the defined types were placed in this encounter.   Follow-up: Return in about 3 months (around 05/11/2016) for smoking cessation .   Boykin Nearing MD

## 2016-02-10 LAB — VITAMIN D 25 HYDROXY (VIT D DEFICIENCY, FRACTURES): Vit D, 25-Hydroxy: 25 ng/mL — ABNORMAL LOW (ref 30–100)

## 2016-02-10 LAB — VITAMIN B12: Vitamin B-12: 256 pg/mL (ref 200–1100)

## 2016-02-10 LAB — HIV ANTIBODY (ROUTINE TESTING W REFLEX): HIV 1&2 Ab, 4th Generation: NONREACTIVE

## 2016-02-11 DIAGNOSIS — E559 Vitamin D deficiency, unspecified: Secondary | ICD-10-CM | POA: Insufficient documentation

## 2016-02-11 MED ORDER — VITAMIN D3 50 MCG (2000 UT) PO TABS: 2000.0000 [IU] | ORAL_TABLET | Freq: Every day | ORAL | 11 refills | Status: DC

## 2016-02-11 NOTE — Assessment & Plan Note (Signed)
Exam and hx consistent with De Quervain's tenosynovitis  NSAID Wrist brace Rest

## 2016-02-11 NOTE — Assessment & Plan Note (Signed)
Smoker  Desires to quit Nicotine patches ordered

## 2016-02-11 NOTE — Assessment & Plan Note (Signed)
Normal breast exam Screening mammogram ordered

## 2016-02-11 NOTE — Assessment & Plan Note (Signed)
Chronic cervical neck pain Refilled tylenol #3

## 2016-02-12 LAB — URINE CYTOLOGY ANCILLARY ONLY
Chlamydia: NEGATIVE
Neisseria Gonorrhea: NEGATIVE
Trichomonas: NEGATIVE

## 2016-02-13 ENCOUNTER — Telehealth: Payer: Self-pay | Admitting: Family Medicine

## 2016-02-13 DIAGNOSIS — B9689 Other specified bacterial agents as the cause of diseases classified elsewhere: Secondary | ICD-10-CM

## 2016-02-13 DIAGNOSIS — N760 Acute vaginitis: Secondary | ICD-10-CM

## 2016-02-13 DIAGNOSIS — M654 Radial styloid tenosynovitis [de Quervain]: Secondary | ICD-10-CM

## 2016-02-13 DIAGNOSIS — K219 Gastro-esophageal reflux disease without esophagitis: Secondary | ICD-10-CM

## 2016-02-13 NOTE — Telephone Encounter (Signed)
Pt. Called requesting to speak with her nurse regarding her OV on 02/09/16. Pt. Has some questions to ask her nurse. Please f/u.

## 2016-02-13 NOTE — Telephone Encounter (Signed)
Patients phone call was returned.

## 2016-02-14 LAB — URINE CYTOLOGY ANCILLARY ONLY
Bacterial vaginitis: NEGATIVE
Candida vaginitis: NEGATIVE

## 2016-02-15 ENCOUNTER — Ambulatory Visit: Payer: Self-pay

## 2016-02-20 ENCOUNTER — Other Ambulatory Visit (HOSPITAL_COMMUNITY): Payer: Self-pay | Admitting: *Deleted

## 2016-02-20 DIAGNOSIS — N644 Mastodynia: Secondary | ICD-10-CM

## 2016-03-01 NOTE — Telephone Encounter (Signed)
Pt. Called requesting to speak with her nurse regarding some test she had done. Please f/u with pt.

## 2016-03-04 NOTE — Telephone Encounter (Signed)
Pt calling again requesting to speak with nurse  Pt has questions regarding her last visit

## 2016-03-07 ENCOUNTER — Other Ambulatory Visit: Payer: Self-pay

## 2016-03-07 MED ORDER — NAPROXEN 500 MG PO TABS: 500.0000 mg | ORAL_TABLET | Freq: Two times a day (BID) | ORAL | 0 refills | Status: DC

## 2016-03-07 NOTE — Telephone Encounter (Signed)
Please call to patient  I have reviewed her last OV I am not sure what her question might be but I did see that her plan for L De Quervains tenosynovitis included NSAID and I did not order one at the time, I have ordered naproxen just in case that was the question

## 2016-03-07 NOTE — Telephone Encounter (Signed)
Pt calling again requesting to speak with nurse  Pt has questions regarding her last visit.  Please f/u

## 2016-03-08 ENCOUNTER — Ambulatory Visit (HOSPITAL_COMMUNITY)
Admission: RE | Admit: 2016-03-08 | Discharge: 2016-03-08 | Disposition: A | Discharge status: Home / Self Care | Payer: Self-pay | Source: Ambulatory Visit | Attending: Obstetrics and Gynecology | Admitting: Obstetrics and Gynecology

## 2016-03-08 ENCOUNTER — Ambulatory Visit
Admission: RE | Admit: 2016-03-08 | Discharge: 2016-03-08 | Disposition: A | Discharge status: Home / Self Care | Payer: No Typology Code available for payment source | Source: Ambulatory Visit | Attending: Obstetrics and Gynecology | Admitting: Obstetrics and Gynecology

## 2016-03-08 ENCOUNTER — Other Ambulatory Visit: Payer: Self-pay

## 2016-03-08 DIAGNOSIS — N644 Mastodynia: Secondary | ICD-10-CM

## 2016-03-08 MED FILL — NAPROXEN 500 MG TABLET: 500 | 15 days supply | Qty: 30 | Fill #0

## 2016-03-08 NOTE — Telephone Encounter (Signed)
Pt was called on 8/25 and left voicemail for patient to call back.

## 2016-03-08 NOTE — Telephone Encounter (Signed)
Pt want to know if a script can be sent in for her acid reflux, and she is having nausea. Pt also wants  Flagyl refilled.

## 2016-03-11 MED ORDER — RANITIDINE HCL 150 MG PO TABS: 150.0000 mg | ORAL_TABLET | Freq: Two times a day (BID) | ORAL | 2 refills | Status: DC

## 2016-03-11 MED FILL — raNITIdine HCL 150 MG TABS: 150 | 30 days supply | Qty: 60 | Fill #0

## 2016-03-11 NOTE — Telephone Encounter (Signed)
Pt states she want the flagyl for Bv. Pt stated that you are aware of her hang BV

## 2016-03-11 NOTE — Telephone Encounter (Signed)
Please call back to patient  Zantac sent in for acid reflux take twice daily Avoid common triggers: over eating, citrus, tomato, spicy foods.  Please ask patient what is the flagyl requested for, usually OV needed to determine if antibiotics or needed.  Also let her know that flagyl can be a very nauseating antibiotics and therefore not best to take it if she is already nauseated.

## 2016-03-14 MED ORDER — METRONIDAZOLE 500 MG PO TABS: 500.0000 mg | ORAL_TABLET | Freq: Two times a day (BID) | ORAL | 0 refills | Status: DC

## 2016-03-14 MED FILL — ACETAMINOPHEN/COD #3 TABLET: 300-30 | 20 days supply | Qty: 60 | Fill #1

## 2016-03-14 MED FILL — VIT D2 1.25 MG (50,000 UNIT: 1.25 MG | 28 days supply | Qty: 4 | Fill #1

## 2016-03-14 MED FILL — metroNIDAZOLE 500 MG TABS: 500 | 7 days supply | Qty: 14 | Fill #0

## 2016-03-14 NOTE — Telephone Encounter (Signed)
Flagyl sent in to onsite pharmacy

## 2016-04-17 ENCOUNTER — Ambulatory Visit: Payer: No Typology Code available for payment source

## 2016-04-17 ENCOUNTER — Telehealth: Payer: Self-pay

## 2016-04-17 NOTE — Telephone Encounter (Signed)
Left message requesting return call to [facility].  When patient calls we need to know if her PCP Dr. Adrian Blackwater ordered the Testosterone.  Did not locate in chart.

## 2016-04-18 ENCOUNTER — Ambulatory Visit: Payer: No Typology Code available for payment source

## 2016-04-26 MED FILL — ACETAMINOPHEN/COD #3 TABLET: 300-30 | 20 days supply | Qty: 60 | Fill #2

## 2016-04-26 MED FILL — VIT D2 1.25 MG (50,000 UNIT: 1.25 MG | 28 days supply | Qty: 4 | Fill #2

## 2016-06-16 ENCOUNTER — Encounter (HOSPITAL_COMMUNITY): Payer: Self-pay

## 2016-06-16 ENCOUNTER — Inpatient Hospital Stay (HOSPITAL_COMMUNITY)
Admission: AD | Admit: 2016-06-16 | Discharge: 2016-06-16 | Disposition: A | Discharge status: Home / Self Care | Payer: No Typology Code available for payment source | Source: Ambulatory Visit | Attending: Family Medicine | Admitting: Family Medicine

## 2016-06-16 DIAGNOSIS — R319 Hematuria, unspecified: Secondary | ICD-10-CM

## 2016-06-16 DIAGNOSIS — R110 Nausea: Secondary | ICD-10-CM | POA: Insufficient documentation

## 2016-06-16 DIAGNOSIS — R1900 Intra-abdominal and pelvic swelling, mass and lump, unspecified site: Secondary | ICD-10-CM | POA: Insufficient documentation

## 2016-06-16 LAB — CBC
HCT: 38.7 % (ref 36.0–46.0)
Hemoglobin: 13.5 g/dL (ref 12.0–15.0)
MCH: 32.9 pg (ref 26.0–34.0)
MCHC: 34.9 g/dL (ref 30.0–36.0)
MCV: 94.4 fL (ref 78.0–100.0)
Platelets: 226 10*3/uL (ref 150–400)
RBC: 4.1 MIL/uL (ref 3.87–5.11)
RDW: 12.7 % (ref 11.5–15.5)
WBC: 7.7 10*3/uL (ref 4.0–10.5)

## 2016-06-16 LAB — COMPREHENSIVE METABOLIC PANEL
ALT: 14 U/L (ref 14–54)
AST: 16 U/L (ref 15–41)
Albumin: 3.4 g/dL — ABNORMAL LOW (ref 3.5–5.0)
Alkaline Phosphatase: 55 U/L (ref 38–126)
Anion gap: 5 (ref 5–15)
BUN: 7 mg/dL (ref 6–20)
CO2: 25 mmol/L (ref 22–32)
Calcium: 8.7 mg/dL — ABNORMAL LOW (ref 8.9–10.3)
Chloride: 108 mmol/L (ref 101–111)
Creatinine, Ser: 0.84 mg/dL (ref 0.44–1.00)
GFR calc Af Amer: >60 mL/min (ref >60)
GFR calc non Af Amer: >60 mL/min (ref >60)
Glucose, Bld: 106 mg/dL — ABNORMAL HIGH (ref 65–99)
Potassium: 4 mmol/L (ref 3.5–5.1)
Sodium: 138 mmol/L (ref 135–145)
Total Bilirubin: 0.3 mg/dL (ref 0.3–1.2)
Total Protein: 6.1 g/dL — ABNORMAL LOW (ref 6.5–8.1)

## 2016-06-16 LAB — URINALYSIS, ROUTINE W REFLEX MICROSCOPIC
Bilirubin Urine: NEGATIVE
Glucose, UA: NEGATIVE mg/dL
Ketones, ur: NEGATIVE mg/dL
Leukocytes, UA: NEGATIVE
Nitrite: NEGATIVE
Protein, ur: NEGATIVE mg/dL
Specific Gravity, Urine: 1.025 (ref 1.005–1.030)
pH: 6 (ref 5.0–8.0)

## 2016-06-16 LAB — URINE MICROSCOPIC-ADD ON
Bacteria, UA: NONE SEEN
WBC, UA: NONE SEEN WBC/hpf (ref 0–5)

## 2016-06-16 LAB — AMYLASE: Amylase: 47 U/L (ref 28–100)

## 2016-06-16 LAB — LIPASE, BLOOD: Lipase: 33 U/L (ref 11–51)

## 2016-06-16 LAB — POCT PREGNANCY, URINE: Preg Test, Ur: NEGATIVE

## 2016-06-16 MED ORDER — ONDANSETRON HCL 4 MG PO TABS: 4.0000 mg | ORAL_TABLET | Freq: Three times a day (TID) | ORAL | 1 refills | Status: DC | PRN

## 2016-06-16 MED ORDER — ONDANSETRON 8 MG PO TBDP
8.0000 mg | ORAL_TABLET | Freq: Once | ORAL | Status: AC
  Administered 2016-06-16: 8 mg via ORAL
  Filled 2016-06-16: qty 1

## 2016-06-16 NOTE — MAU Provider Note (Signed)
Chief Complaint: Nausea   First Provider Initiated Contact with Patient 06/16/16 0930     SUBJECTIVE HPI: Caitlin Valdez is a 45 y.o. G30P2002 female who presents to [facility] Admissions reporting nausea and feeling a knot on her left side today. Having pain on left side too.  Location: Left side Quality: dull Severity: 7/10 on pain scale Duration: 1 day Context: None Timing: constant Modifying factors: None. Hasn't tried anything.  Associated signs and symptoms: Feeling knot on left side.   Past Medical History:  Diagnosis Date  . Abdominal pain, right lower quadrant   . Anxiety   . Depression   . Fibromyalgia   . GERD (gastroesophageal reflux disease)   . Migraine   . Obesity   . Peripheral vascular disease (HCC)    RIGHT LEG  . PONV (postoperative nausea and vomiting)   . Stroke ([place])   . Trichomonas 2012   OB History  Gravida Para Term Preterm AB Living  2 2 2     2   SAB TAB Ectopic Multiple Live Births               # Outcome Date GA Lbr Len/2nd Weight Sex Delivery Anes PTL Lv  2 Term           1 Term              Past Surgical History:  Procedure Laterality Date  . CESAREAN SECTION    . FRACTURE SURGERY    . HYSTEROSCOPY WITH NOVASURE N/A 08/08/2014   Procedure:  NOVASURE;  Surgeon: Emily Filbert, MD;  Location: [facility];  Service: Gynecology;  Laterality: N/A;  . VEIN SURGERY     Social History   Social History  . Marital status: Divorced    Spouse name: N/A  . Number of children: N/A  . Years of education: N/A   Occupational History  . Not on file.   Social History Main Topics  . Smoking status: Current Every Day Smoker    Packs/day: 1.00    Years: 5.00    Types: Cigarettes  . Smokeless tobacco: Never Used  . Alcohol use No  . Drug use: No  . Sexual activity: Yes    Birth control/ protection: Condom   Other Topics Concern  . Not on file   Social History Narrative  . No narrative on file   Family History  Problem Relation Age of Onset  .  Diabetes Mother   . Hypertension Mother   . Diabetes Father   . Hypertension Father   . Diabetes Other   . Hypertension Other   . Other Neg Hx    No current facility-administered medications on file prior to encounter.    Current Outpatient Prescriptions on File Prior to Encounter  Medication Sig Dispense Refill  . cetirizine (ZYRTEC) 10 MG tablet Take 1 tablet (10 mg total) by mouth daily. 30 tablet 11   No Known Allergies  I have reviewed patient's Past Medical Hx, Surgical Hx, Family Hx, Social Hx, medications and allergies.   Review of Systems  Constitutional: Negative for appetite change, chills and fever.  Gastrointestinal: Positive for nausea. Negative for abdominal distention, abdominal pain, constipation, diarrhea and vomiting.  Genitourinary: Positive for flank pain. Negative for difficulty urinating, dysuria, frequency, hematuria, menstrual problem, pelvic pain, urgency, vaginal bleeding and vaginal discharge.  Musculoskeletal: Negative for back pain.    OBJECTIVE Patient Vitals for the past 24 hrs:  BP Temp Pulse Resp Height Weight  06/16/16  0916 121/73 97.9 F (36.6 C) 77 16 5\' 3"  (1.6 m) 193 lb (87.5 kg)   Constitutional: Well-developed, well-nourished female in no acute distress.  Cardiovascular: normal rate Respiratory: normal rate and effort.  GI: Abd soft, non-tender, String of firm, mobile, ~1 cm, NT masses palpated on left side a few inches below ribs. Pos BS x 4 MS: Extremities nontender, no edema, normal ROM Neurologic: Alert and oriented x 4.  GU: Neg CVAT.  SPECULUM EXAM: Deferred  LAB RESULTS Results for orders placed or performed during the hospital encounter of 06/16/16 (from the past 24 hour(s))  Urinalysis, Routine w reflex microscopic (not at [facility])     Status: Abnormal   Collection Time: 06/16/16  9:00 AM  Result Value Ref Range   Color, Urine YELLOW YELLOW   APPearance CLEAR CLEAR   Specific Gravity, Urine 1.025 1.005 - 1.030   pH 6.0 5.0  - 8.0   Glucose, UA NEGATIVE NEGATIVE mg/dL   Hgb urine dipstick LARGE (A) NEGATIVE   Bilirubin Urine NEGATIVE NEGATIVE   Ketones, ur NEGATIVE NEGATIVE mg/dL   Protein, ur NEGATIVE NEGATIVE mg/dL   Nitrite NEGATIVE NEGATIVE   Leukocytes, UA NEGATIVE NEGATIVE  Urine microscopic-add on     Status: Abnormal   Collection Time: 06/16/16  9:00 AM  Result Value Ref Range   Squamous Epithelial / LPF 0-5 (A) NONE SEEN   WBC, UA NONE SEEN 0 - 5 WBC/hpf   RBC / HPF 6-30 0 - 5 RBC/hpf   Bacteria, UA NONE SEEN NONE SEEN   Urine-Other MUCOUS PRESENT   Pregnancy, urine POC     Status: None   Collection Time: 06/16/16  9:12 AM  Result Value Ref Range   Preg Test, Ur NEGATIVE NEGATIVE  CBC     Status: None   Collection Time: 06/16/16 10:02 AM  Result Value Ref Range   WBC 7.7 4.0 - 10.5 K/uL   RBC 4.10 3.87 - 5.11 MIL/uL   Hemoglobin 13.5 12.0 - 15.0 g/dL   HCT 38.7 36.0 - 46.0 %   MCV 94.4 78.0 - 100.0 fL   MCH 32.9 26.0 - 34.0 pg   MCHC 34.9 30.0 - 36.0 g/dL   RDW 12.7 11.5 - 15.5 %   Platelets 226 150 - 400 K/uL  Comprehensive metabolic panel     Status: Abnormal   Collection Time: 06/16/16 10:02 AM  Result Value Ref Range   Sodium 138 135 - 145 mmol/L   Potassium 4.0 3.5 - 5.1 mmol/L   Chloride 108 101 - 111 mmol/L   CO2 25 22 - 32 mmol/L   Glucose, Bld 106 (H) 65 - 99 mg/dL   BUN 7 6 - 20 mg/dL   Creatinine, Ser 0.84 0.44 - 1.00 mg/dL   Calcium 8.7 (L) 8.9 - 10.3 mg/dL   Total Protein 6.1 (L) 6.5 - 8.1 g/dL   Albumin 3.4 (L) 3.5 - 5.0 g/dL   AST 16 15 - 41 U/L   ALT 14 14 - 54 U/L   Alkaline Phosphatase 55 38 - 126 U/L   Total Bilirubin 0.3 0.3 - 1.2 mg/dL   GFR calc non Af Amer >60 >60 mL/min   GFR calc Af Amer >60 >60 mL/min   Anion gap 5 5 - 15  Amylase     Status: None   Collection Time: 06/16/16 10:02 AM  Result Value Ref Range   Amylase 47 28 - 100 U/L  Lipase, blood     Status: None  Collection Time: 06/16/16 10:02 AM  Result Value Ref Range   Lipase 33 11 - 51  U/L    IMAGING No results found.  MAU COURSE Orders Placed This Encounter  Procedures  . Culture, Urine  . Urinalysis, Routine w reflex microscopic (not at [facility])  . Urine microscopic-add on  . CBC  . Comprehensive metabolic panel  . Amylase  . Lipase, blood  . Pregnancy, urine POC  . Discharge patient   Meds ordered this encounter  Medications  . ondansetron (ZOFRAN-ODT) disintegrating tablet 8 mg  . acetaminophen-codeine (TYLENOL #3) 300-30 MG tablet    Sig: Take 1 tablet by mouth every 8 (eight) hours as needed for moderate pain.  . Vitamin D, Ergocalciferol, (DRISDOL) 50000 units CAPS capsule    Sig: Take 50,000 Units by mouth every 7 (seven) days.  . ranitidine (ZANTAC) 150 MG tablet    Sig: Take 150 mg by mouth 2 (two) times daily as needed for heartburn.  . ondansetron (ZOFRAN) 4 MG tablet    Sig: Take 1 tablet (4 mg total) by mouth every 8 (eight) hours as needed for nausea or vomiting.    Dispense:  20 tablet    Refill:  1    Order Specific Question:   Supervising Provider    Answer:   Silas [place] H [id number]   Upon review of chart pt has had blood in urine in every specimen since 2009. Asked her about this. States she has never been told about this and is not aware of ever having this addressed.   MDM - Acute nausea of unknown etiology. Tolerating PO's. Possibly viral.  - Masses on left flank area of unknown etiology. Discussed w/ MD. Recommend F/U w/ [facility] for further testing of masses and hematuria to see in related.  - Chronic hematuria of unknown etiology. No Sx UTI, doubt pyelo due to absence of urinary complaints, only mild nausea w/out vomiting and absence of leukocytosis, vomiting or CVAT. Will culture urine. Pyelo precautions reviewed.   ASSESSMENT 1. Nausea without vomiting   2. Hematuria, unspecified type   3. Left flank mass     PLAN Discharge home in stable condition. Pyelo precautions Rx Zofran.  Follow-up Information     Minerva Ends, MD Follow up.   Specialty:  Family Medicine Why:  schedule appointment for further evaluation of blood in you urine, nudules on your left side and GI complaints.  Contact information: [place] [address] [phone]        [place] Follow up.   Specialty:  Emergency Medicine Why:  as needed in emergencies Contact information: [address] [id number] [place] [phone]           Medication List    STOP taking these medications   acetaminophen-codeine 300-30 MG tablet Commonly known as:  TYLENOL #3   cetirizine 10 MG tablet Commonly known as:  ZYRTEC   ranitidine 150 MG tablet Commonly known as:  ZANTAC   Vitamin D (Ergocalciferol) 50000 units Caps capsule Commonly known as:  DRISDOL     TAKE these medications   ondansetron 4 MG tablet Commonly known as:  ZOFRAN Take 1 tablet (4 mg total) by mouth every 8 (eight) hours as needed for nausea or vomiting.        [place], CNM 06/16/2016  11:15 AM

## 2016-06-16 NOTE — MAU Note (Signed)
Pt was at work, felt nauseous and has a pain and knot in her left side. Pain 7/10

## 2016-06-16 NOTE — Discharge Instructions (Signed)
Nausea, Adult Nausea is the feeling of an upset stomach or having to vomit. Nausea on its own is not usually a serious concern, but it may be an early sign of a more serious medical problem. As nausea gets worse, it can lead to vomiting. If vomiting develops, or if you are not able to drink enough fluids, you are at risk of becoming dehydrated. Dehydration can make you tired and thirsty, cause you to have a dry mouth, and decrease how often you urinate. Older adults and people with other diseases or a weak immune system are at higher risk for dehydration. The main goals of treating your nausea are:  To limit repeated nausea episodes.  To prevent vomiting and dehydration. Follow these instructions at home: Follow instructions from your health care provider about how to care for yourself at home. Eating and drinking Follow these recommendations as told by your health care provider:  Take an oral rehydration solution (ORS). This is a drink that is sold at pharmacies and retail stores.  Drink clear fluids in small amounts as you are able. Clear fluids include water, ice chips, diluted fruit juice, and low-calorie sports drinks.  Eat bland, easy-to-digest foods in small amounts as you are able. These foods include bananas, applesauce, rice, lean meats, toast, and crackers.  Avoid drinking fluids that contain a lot of sugar or caffeine, such as energy drinks, sports drinks, and soda.  Avoid alcohol.  Avoid spicy or fatty foods. General instructions  Drink enough fluid to keep your urine clear or pale yellow.  Wash your hands often. If soap and water are not available, use hand sanitizer.  Make sure that all people in your household wash their hands well and often.  Rest at home while you recover.  Take over-the-counter and prescription medicines only as told by your health care provider.  Breathe slowly and deeply when you feel nauseous.  Watch your condition for any changes.  Keep  all follow-up visits as told by your health care provider. This is important. Contact a health care provider if:  You have a headache.  You have new symptoms.  Your nausea gets worse.  You have a fever.  You feel light-headed or dizzy.  You vomit.  You cannot keep fluids down. Get help right away if:  You have pain in your chest, neck, arm, or jaw.  You feel extremely weak or you faint.  You have vomit that is bright red or looks like coffee grounds.  You have bloody or black stools or stools that look like tar.  You have a severe headache, a stiff neck, or both.  You have severe pain, cramping, or bloating in your abdomen.  You have a rash.  You have difficulty breathing or are breathing very quickly.  Your heart is beating very quickly.  Your skin feels cold and clammy.  You feel confused.  You have pain when you urinate.  You have signs of dehydration, such as:  Dark urine, very little, or no urine.  Cracked lips.  Dry mouth.  Sunken eyes.  Sleepiness.  Weakness. These symptoms may represent a serious problem that is an emergency. Do not wait to see if the symptoms will go away. Get medical help right away. Call your local emergency services (911 in the [place] Do not drive yourself to the hospital.  This information is not intended to replace advice given to you by your health care provider. Make sure you discuss any questions you have with  your health care provider. Document Released: 08/08/2004 Document Revised: 12/04/2015 Document Reviewed: 03/07/2015 Elsevier Interactive Patient Education  2017 [employer] Inc[employer]   Hematuria, Adult Hematuria is blood in your urine. It can be caused by a bladder infection, kidney infection, prostate infection, kidney stone, or cancer of your urinary tract. Infections can usually be treated with medicine, and a kidney stone usually will pass through your urine. If neither of these is the cause of your hematuria, further  workup to find out the reason may be needed. It is very important that you tell your health care provider about any blood you see in your urine, even if the blood stops without treatment or happens without causing pain. Blood in your urine that happens and then stops and then happens again can be a symptom of a very serious condition. Also, pain is not a symptom in the initial stages of many urinary cancers. Follow these instructions at home:  Drink lots of fluid, 3-4 quarts a day. If you have been diagnosed with an infection, cranberry juice is especially recommended, in addition to large amounts of water.  Avoid caffeine, tea, and carbonated beverages because they tend to irritate the bladder.  Avoid alcohol because it may irritate the prostate.  Take all medicines as directed by your health care provider.  If you were prescribed an antibiotic medicine, finish it all even if you start to feel better.  If you have been diagnosed with a kidney stone, follow your health care provider's instructions regarding straining your urine to catch the stone.  Empty your bladder often. Avoid holding urine for long periods of time.  After a bowel movement, women should cleanse front to back. Use each tissue only once.  Empty your bladder before and after sexual intercourse if you are a female. Contact a health care provider if:  You develop back pain.  You have a fever.  You have a feeling of sickness in your stomach (nausea) or vomiting.  Your symptoms are not better in 3 days. Return sooner if you are getting worse. Get help right away if:  You develop severe vomiting and are unable to keep the medicine down.  You develop severe back or abdominal pain despite taking your medicines.  You begin passing a large amount of blood or clots in your urine.  You feel extremely weak or faint, or you pass out. This information is not intended to replace advice given to you by your health care  provider. Make sure you discuss any questions you have with your health care provider. Document Released: 07/01/2005 Document Revised: 12/07/2015 Document Reviewed: 03/01/2013 Elsevier Interactive Patient Education  2017 [employer].

## 2016-06-16 NOTE — MAU Note (Signed)
Discharge instructions reviewed with pt. Signature pad not working, had pt sign discharge papers and filed with paper chart.

## 2016-06-17 LAB — URINE CULTURE: Culture: NO GROWTH

## 2016-06-18 ENCOUNTER — Encounter (HOSPITAL_COMMUNITY): Payer: Self-pay | Admitting: Emergency Medicine

## 2016-06-18 ENCOUNTER — Emergency Department (HOSPITAL_COMMUNITY)
Admission: EM | Admit: 2016-06-18 | Discharge: 2016-06-18 | Disposition: A | Discharge status: Home / Self Care | Payer: No Typology Code available for payment source | Attending: Emergency Medicine | Admitting: Emergency Medicine

## 2016-06-18 DIAGNOSIS — F17210 Nicotine dependence, cigarettes, uncomplicated: Secondary | ICD-10-CM | POA: Insufficient documentation

## 2016-06-18 DIAGNOSIS — Z8673 Personal history of transient ischemic attack (TIA), and cerebral infarction without residual deficits: Secondary | ICD-10-CM | POA: Insufficient documentation

## 2016-06-18 DIAGNOSIS — G43809 Other migraine, not intractable, without status migrainosus: Secondary | ICD-10-CM | POA: Insufficient documentation

## 2016-06-18 LAB — URINALYSIS, ROUTINE W REFLEX MICROSCOPIC
Bacteria, UA: NONE SEEN
Bilirubin Urine: NEGATIVE
Glucose, UA: NEGATIVE mg/dL
Ketones, ur: NEGATIVE mg/dL
Leukocytes, UA: NEGATIVE
Nitrite: NEGATIVE
Protein, ur: NEGATIVE mg/dL
Specific Gravity, Urine: 1.002 — ABNORMAL LOW (ref 1.005–1.030)
pH: 6 (ref 5.0–8.0)

## 2016-06-18 LAB — COMPREHENSIVE METABOLIC PANEL
ALT: 17 U/L (ref 14–54)
AST: 19 U/L (ref 15–41)
Albumin: 4 g/dL (ref 3.5–5.0)
Alkaline Phosphatase: 67 U/L (ref 38–126)
Anion gap: 5 (ref 5–15)
BUN: 8 mg/dL (ref 6–20)
CO2: 27 mmol/L (ref 22–32)
Calcium: 9.3 mg/dL (ref 8.9–10.3)
Chloride: 106 mmol/L (ref 101–111)
Creatinine, Ser: 0.84 mg/dL (ref 0.44–1.00)
GFR calc Af Amer: >60 mL/min (ref >60)
GFR calc non Af Amer: >60 mL/min (ref >60)
Glucose, Bld: 109 mg/dL — ABNORMAL HIGH (ref 65–99)
Potassium: 4.1 mmol/L (ref 3.5–5.1)
Sodium: 138 mmol/L (ref 135–145)
Total Bilirubin: 0.4 mg/dL (ref 0.3–1.2)
Total Protein: 7 g/dL (ref 6.5–8.1)

## 2016-06-18 LAB — CBC
HCT: 42.5 % (ref 36.0–46.0)
Hemoglobin: 14.5 g/dL (ref 12.0–15.0)
MCH: 32.8 pg (ref 26.0–34.0)
MCHC: 34.1 g/dL (ref 30.0–36.0)
MCV: 96.2 fL (ref 78.0–100.0)
Platelets: 250 10*3/uL (ref 150–400)
RBC: 4.42 MIL/uL (ref 3.87–5.11)
RDW: 12.6 % (ref 11.5–15.5)
WBC: 9.6 10*3/uL (ref 4.0–10.5)

## 2016-06-18 LAB — LIPASE, BLOOD: Lipase: 34 U/L (ref 11–51)

## 2016-06-18 MED ORDER — DIPHENHYDRAMINE HCL 50 MG/ML IJ SOLN
25.0000 mg | Freq: Once | INTRAMUSCULAR | Status: AC
  Administered 2016-06-18: 25 mg via INTRAVENOUS
  Filled 2016-06-18: qty 1

## 2016-06-18 MED ORDER — OXYCODONE-ACETAMINOPHEN 5-325 MG PO TABS
1.0000 | ORAL_TABLET | ORAL | Status: DC | PRN
  Administered 2016-06-18: 1 via ORAL
  Filled 2016-06-18: qty 1

## 2016-06-18 MED ORDER — SODIUM CHLORIDE 0.9 % IV BOLUS (SEPSIS)
1000.0000 mL | Freq: Once | INTRAVENOUS | Status: AC
  Administered 2016-06-18: 1000 mL via INTRAVENOUS

## 2016-06-18 MED ORDER — KETOROLAC TROMETHAMINE 30 MG/ML IJ SOLN
30.0000 mg | Freq: Once | INTRAMUSCULAR | Status: AC
  Administered 2016-06-18: 30 mg via INTRAVENOUS
  Filled 2016-06-18: qty 1

## 2016-06-18 MED ORDER — ONDANSETRON 4 MG PO TBDP
4.0000 mg | ORAL_TABLET | Freq: Once | ORAL | Status: AC | PRN
  Administered 2016-06-18: 4 mg via ORAL
  Filled 2016-06-18: qty 1

## 2016-06-18 MED ORDER — DEXAMETHASONE SODIUM PHOSPHATE 10 MG/ML IJ SOLN
10.0000 mg | Freq: Once | INTRAMUSCULAR | Status: AC
  Administered 2016-06-18: 10 mg via INTRAVENOUS
  Filled 2016-06-18: qty 1

## 2016-06-18 MED ORDER — METOCLOPRAMIDE HCL 5 MG/ML IJ SOLN
10.0000 mg | Freq: Once | INTRAMUSCULAR | Status: AC
  Administered 2016-06-18: 10 mg via INTRAVENOUS
  Filled 2016-06-18: qty 2

## 2016-06-18 NOTE — ED Notes (Signed)
Patient made aware medication can make her drowsy. Patient advised to not drive after taking this medication. Patient verbalized understanding.

## 2016-06-18 NOTE — ED Provider Notes (Signed)
[facility] DEPT Provider Note   CSN: [id number] Arrival date & time: 06/18/16  1626     History   Chief Complaint Chief Complaint  Patient presents with  . Headache  . Abdominal Pain    HPI Caitlin Valdez is a 45 y.o. female.  Patient is a 45 year old female with past medical history of migraines, depression, and fibromyalgia. She presents for evaluation of headache and nausea that started yesterday. She reports vomiting. She also reports being seen 2 days ago by her primary Dr. She was told she had blood in her urine. She denies any dysuria, flank pain, or fevers.   The history is provided by the patient.  Headache   This is a recurrent problem. The current episode started yesterday. The problem occurs constantly. The problem has been gradually worsening. The headache is associated with bright light, activity and loud noise. The pain is located in the frontal region. The quality of the pain is described as throbbing. The pain is severe. The pain does not radiate. She has tried nothing for the symptoms. The treatment provided no relief.  Abdominal Pain   Associated symptoms include headaches.    Past Medical History:  Diagnosis Date  . Abdominal pain, right lower quadrant   . Anxiety   . Depression   . Fibromyalgia   . GERD (gastroesophageal reflux disease)   . Migraine   . Obesity   . Peripheral vascular disease (HCC)    RIGHT LEG  . PONV (postoperative nausea and vomiting)   . Stroke ([place])   . Trichomonas 2012    Patient Active Problem List   Diagnosis Date Noted  . Vitamin D insufficiency 02/11/2016  . De Quervain's tenosynovitis, left 02/09/2016  . Vitamin B12 deficiency 10/17/2015  . Allergic rhinitis 12/29/2014  . Current smoker 11/22/2014  . DUB (dysfunctional uterine bleeding) 08/08/2014  . Obesity 06/29/2014  . Spinal stenosis in cervical region 02/22/2014  . Visit for screening mammogram 12/23/2013  . Fibromyalgia 07/15/2009    Past Surgical  History:  Procedure Laterality Date  . CESAREAN SECTION    . FRACTURE SURGERY    . HYSTEROSCOPY WITH NOVASURE N/A 08/08/2014   Procedure:  NOVASURE;  Surgeon: Emily Filbert, MD;  Location: [facility];  Service: Gynecology;  Laterality: N/A;  . VEIN SURGERY      OB History    Gravida Para Term Preterm AB Living   2 2 2     2    SAB TAB Ectopic Multiple Live Births                   Home Medications    Prior to Admission medications   Medication Sig Start Date End Date Taking? Authorizing Provider  ondansetron (ZOFRAN) 4 MG tablet Take 1 tablet (4 mg total) by mouth every 8 (eight) hours as needed for nausea or vomiting. 06/16/16  Yes Manya Silvas, CNM    Family History Family History  Problem Relation Age of Onset  . Diabetes Mother   . Hypertension Mother   . Diabetes Father   . Hypertension Father   . Diabetes Other   . Hypertension Other   . Other Neg Hx     Social History Social History  Substance Use Topics  . Smoking status: Current Every Day Smoker    Packs/day: 1.00    Years: 5.00    Types: Cigarettes  . Smokeless tobacco: Never Used  . Alcohol use No     Allergies   Patient  has no known allergies.   Review of Systems Review of Systems  Gastrointestinal: Positive for abdominal pain.  Neurological: Positive for headaches.  All other systems reviewed and are negative.    Physical Exam Updated Vital Signs BP 125/72 (BP Location: Left Arm)   Pulse 73   Temp 98.6 F (37 C) (Oral)   Resp 18   Ht 5\' 3"  (1.6 m)   Wt 193 lb (87.5 kg)   SpO2 100%   BMI 34.19 kg/m   Physical Exam  Constitutional: She is oriented to person, place, and time. She appears well-developed and well-nourished. No distress.  HENT:  Head: Normocephalic and atraumatic.  Eyes: EOM are normal. Pupils are equal, round, and reactive to light.  Neck: Normal range of motion. Neck supple.  Cardiovascular: Normal rate and regular rhythm.  Exam reveals no gallop and no friction rub.     No murmur heard. Pulmonary/Chest: Effort normal and breath sounds normal. No respiratory distress. She has no wheezes.  Abdominal: Soft. Bowel sounds are normal. She exhibits no distension. There is no tenderness.  Musculoskeletal: Normal range of motion.  Neurological: She is alert and oriented to person, place, and time. No cranial nerve deficit. She exhibits normal muscle tone. Coordination normal.  Skin: Skin is warm and dry. She is not diaphoretic.  Nursing note and vitals reviewed.    ED Treatments / Results  Labs (all labs ordered are listed, but only abnormal results are displayed) Labs Reviewed  URINALYSIS, ROUTINE W REFLEX MICROSCOPIC - Abnormal; Notable for the following:       Result Value   Color, Urine COLORLESS (*)    Specific Gravity, Urine 1.002 (*)    Hgb urine dipstick MODERATE (*)    Squamous Epithelial / LPF 0-5 (*)    All other components within normal limits  CBC  LIPASE, BLOOD  COMPREHENSIVE METABOLIC PANEL    EKG  EKG Interpretation None       Radiology No results found.  Procedures Procedures (including critical care time)  Medications Ordered in ED Medications  oxyCODONE-acetaminophen (PERCOCET/ROXICET) 5-325 MG per tablet 1 tablet (1 tablet Oral Given 06/18/16 1646)  sodium chloride 0.9 % bolus 1,000 mL (not administered)  ketorolac (TORADOL) 30 MG/ML injection 30 mg (not administered)  dexamethasone (DECADRON) injection 10 mg (not administered)  diphenhydrAMINE (BENADRYL) injection 25 mg (not administered)  metoCLOPramide (REGLAN) injection 10 mg (not administered)  ondansetron (ZOFRAN-ODT) disintegrating tablet 4 mg (4 mg Oral Given 06/18/16 1646)     Initial Impression / Assessment and Plan / ED Course  I have reviewed the triage vital signs and the nursing notes.  Pertinent labs & imaging results that were available during my care of the patient were reviewed by me and considered in my medical decision making (see chart for  details).  Clinical Course     Patient with history of migraine headaches presents with complaints of headache and nausea. Her neurologic exam is nonfocal and abdomen is benign. She was given a migraine cocktail and symptoms have significantly in route. Laboratory studies are inconsistent with an emergent process and I believe she is appropriate for discharge.  Final Clinical Impressions(s) / ED Diagnoses   Final diagnoses:  None    New Prescriptions New Prescriptions   No medications on file     Veryl Speak, MD 06/18/16 1939

## 2016-06-18 NOTE — ED Notes (Signed)
Unable to collect labs at this time. 

## 2016-06-18 NOTE — ED Triage Notes (Addendum)
Per EMS, patient has been complaining of headache/nausea/abdominal pain x2 hours. Reports 1 episode of vomiting. No neuro deficits noted in triage. Patient alert, oriented x4, ambulatory. Patient was seen at [facility] on Sunday for nausea. Hx of migraines.

## 2016-06-18 NOTE — ED Notes (Signed)
Provided a warm blanket.

## 2016-06-18 NOTE — Discharge Instructions (Signed)
Continue your medications as before, and return to the ER symptoms significantly worsen or change.

## 2016-06-21 ENCOUNTER — Ambulatory Visit: Payer: No Typology Code available for payment source | Admitting: Family Medicine

## 2016-06-25 ENCOUNTER — Inpatient Hospital Stay: Payer: No Typology Code available for payment source | Admitting: Family Medicine

## 2016-06-27 ENCOUNTER — Ambulatory Visit: Payer: Self-pay | Attending: Family Medicine | Admitting: Family Medicine

## 2016-06-27 ENCOUNTER — Encounter: Payer: Self-pay | Admitting: Family Medicine

## 2016-06-27 VITALS — BP 113/72 | HR 88 | Temp 97.9°F | Ht 63.0 in | Wt 197.8 lb

## 2016-06-27 DIAGNOSIS — G43909 Migraine, unspecified, not intractable, without status migrainosus: Secondary | ICD-10-CM

## 2016-06-27 DIAGNOSIS — R102 Pelvic and perineal pain: Secondary | ICD-10-CM

## 2016-06-27 DIAGNOSIS — R1030 Lower abdominal pain, unspecified: Secondary | ICD-10-CM

## 2016-06-27 DIAGNOSIS — Z1231 Encounter for screening mammogram for malignant neoplasm of breast: Secondary | ICD-10-CM

## 2016-06-27 DIAGNOSIS — Z9889 Other specified postprocedural states: Secondary | ICD-10-CM

## 2016-06-27 DIAGNOSIS — Z23 Encounter for immunization: Secondary | ICD-10-CM

## 2016-06-27 DIAGNOSIS — M797 Fibromyalgia: Secondary | ICD-10-CM

## 2016-06-27 DIAGNOSIS — N898 Other specified noninflammatory disorders of vagina: Secondary | ICD-10-CM

## 2016-06-27 DIAGNOSIS — B9689 Other specified bacterial agents as the cause of diseases classified elsewhere: Secondary | ICD-10-CM

## 2016-06-27 DIAGNOSIS — R319 Hematuria, unspecified: Secondary | ICD-10-CM | POA: Insufficient documentation

## 2016-06-27 DIAGNOSIS — F17200 Nicotine dependence, unspecified, uncomplicated: Secondary | ICD-10-CM

## 2016-06-27 DIAGNOSIS — N760 Acute vaginitis: Secondary | ICD-10-CM

## 2016-06-27 DIAGNOSIS — F17210 Nicotine dependence, cigarettes, uncomplicated: Secondary | ICD-10-CM | POA: Insufficient documentation

## 2016-06-27 MED ORDER — CYCLOBENZAPRINE HCL 10 MG PO TABS: 10.0000 mg | ORAL_TABLET | Freq: Three times a day (TID) | ORAL | 0 refills | Status: DC | PRN

## 2016-06-27 MED ORDER — ACETAMINOPHEN-CODEINE #3 300-30 MG PO TABS: 1.0000 | ORAL_TABLET | Freq: Three times a day (TID) | ORAL | 0 refills | Status: DC | PRN

## 2016-06-27 MED ORDER — BUTALBITAL-APAP-CAFFEINE 50-325-40 MG PO TABS: 1.0000 | ORAL_TABLET | Freq: Four times a day (QID) | ORAL | 0 refills | Status: DC | PRN

## 2016-06-27 MED FILL — BUTALB-ACETAMIN-CAFF 50-325: 50-325-40 | 30 days supply | Qty: 30 | Fill #0

## 2016-06-27 MED FILL — ACETAMINOPHEN/COD #3 TABLET: 300-30 | 30 days supply | Qty: 90 | Fill #0

## 2016-06-27 NOTE — Assessment & Plan Note (Signed)
Cessation addressed Advised nicotine patch Patient uninsured Advised target for best OTC price and [place] quit line

## 2016-06-27 NOTE — Assessment & Plan Note (Signed)
Suspect yeast Wet prep STI screen done

## 2016-06-27 NOTE — Progress Notes (Signed)
Subjective:  Patient ID: Caitlin Valdez, female    DOB: 1970/11/12  Age: 45 y.o. MRN: [id number]  CC: Abdominal Pain and Vaginal Discharge   HPI Caitlin Valdez has hx of fibromyalgia, current smoker she  presents for    1. ED f/u: she as seen in MAY on 06/16/2016 for nausea and L abdominal pain. U preg was negative, UA normal except large Hgb, CBC normal, CMP normal, lipase normal, amylase normal. She was treated with zofran and discharged with pyelonephritis precautions although infection was not highly suspected. She returned back to ED on 06/18/2016 for headache and abdominal pain. She had moderate blood in UA, normal CBC, CMP and lipase. She was treated with migraine cocktail and sent home.   Today, she reports persistent L sided abdominal pain. Pain is associated with nausea. Worsened at night and exacerbated by eating red meat and corn.    2. Vaginal discharge: x 1 day. Resolved. Last sex 1 month ago. She  suspect BV. Has lower abdominal pain. No gross hematuria. No vaginal bleeding. No pain with intercourse.  3. Headaches: every AM. Has hx of migraines. Fioricet in past. Smoking. Has nausea. No emesis. No focal weakness or numbness.  4. Smoking: continues to smoke. Has chronic pain. Concerned about developing breast cancer like her mom.  Has tried to quit cold [place] in the past without success.   Social History  Substance Use Topics  . Smoking status: Current Every Day Smoker    Packs/day: 1.00    Years: 5.00    Types: Cigarettes  . Smokeless tobacco: Never Used  . Alcohol use No   Outpatient Medications Prior to Visit  Medication Sig Dispense Refill  . ondansetron (ZOFRAN) 4 MG tablet Take 1 tablet (4 mg total) by mouth every 8 (eight) hours as needed for nausea or vomiting. 20 tablet 1   No facility-administered medications prior to visit.     ROS Review of Systems  Constitutional: Negative for chills and fever.  Eyes: Negative for visual disturbance.  Respiratory:  Negative for shortness of breath.   Cardiovascular: Negative for chest pain.  Gastrointestinal: Positive for abdominal pain. Negative for blood in stool.  Genitourinary: Positive for pelvic pain and vaginal discharge. Negative for dyspareunia, hematuria, vaginal bleeding and vaginal pain.  Musculoskeletal: Positive for myalgias. Negative for arthralgias and back pain.  Skin: Negative for rash.  Allergic/Immunologic: Negative for immunocompromised state.  Neurological: Positive for headaches. Negative for numbness.  Hematological: Negative for adenopathy. Does not bruise/bleed easily.  Psychiatric/Behavioral: Negative for dysphoric mood and suicidal ideas.    Objective:  BP 113/72 (BP Location: Right Arm, Patient Position: Sitting, Cuff Size: Small)   Pulse 88   Temp 97.9 F (36.6 C) (Oral)   Ht 5\' 3"  (1.6 m)   Wt 197 lb 12.8 oz (89.7 kg)   SpO2 100%   BMI 35.04 kg/m   BP/Weight 06/27/2016 06/18/2016 [id number]  Systolic BP 123456 123456 93  Diastolic BP 72 62 53  Wt. (Lbs) 197.8 193 193  BMI 35.04 34.19 34.19    Physical Exam  Constitutional: She appears well-developed and well-nourished. No distress.  Cardiovascular: Normal rate, regular rhythm, normal heart sounds and intact distal pulses.   Pulmonary/Chest: Effort normal and breath sounds normal.  Abdominal: There is tenderness in the left upper quadrant. No hernia.    Obese   Genitourinary: Uterus normal. Pelvic exam was performed with patient prone. There is no rash, tenderness or lesion on the right labia. There is  no rash, tenderness or lesion on the left labia. Cervix exhibits no motion tenderness, no discharge and no friability. Vaginal discharge found.    Musculoskeletal: She exhibits no edema.  Lymphadenopathy:       Right: No inguinal adenopathy present.       Left: No inguinal adenopathy present.  Skin: Skin is warm and dry. No rash noted.     Assessment & Plan:   There are no diagnoses linked to this  encounter. Caitlin Valdez was seen today for abdominal pain and vaginal discharge.  Diagnoses and all orders for this visit:  Vaginal discharge -     Cervicovaginal ancillary only  Hematuria, unspecified type -     CT RENAL STONE STUDY; Future  Lower abdominal pain -     CT Abdomen Pelvis Wo Contrast; Future -     cyclobenzaprine (FLEXERIL) 10 MG tablet; Take 1 tablet (10 mg total) by mouth 3 (three) times daily as needed for muscle spasms.  Visit for screening mammogram -     MM DIGITAL SCREENING BILATERAL; Future  Fibromyalgia -     acetaminophen-codeine (TYLENOL #3) 300-30 MG tablet; Take 1 tablet by mouth every 8 (eight) hours as needed for moderate pain.  Migraine without status migrainosus, not intractable, unspecified migraine type -     butalbital-acetaminophen-caffeine (FIORICET, ESGIC) 50-325-40 MG tablet; Take 1 tablet by mouth every 6 (six) hours as needed for headache.  Current smoker   Meds ordered this encounter  Medications  . cyclobenzaprine (FLEXERIL) 10 MG tablet    Sig: Take 1 tablet (10 mg total) by mouth 3 (three) times daily as needed for muscle spasms.    Dispense:  30 tablet    Refill:  0  . butalbital-acetaminophen-caffeine (FIORICET, ESGIC) 50-325-40 MG tablet    Sig: Take 1 tablet by mouth every 6 (six) hours as needed for headache.    Dispense:  30 tablet    Refill:  0  . acetaminophen-codeine (TYLENOL #3) 300-30 MG tablet    Sig: Take 1 tablet by mouth every 8 (eight) hours as needed for moderate pain.    Dispense:  90 tablet    Refill:  0    Follow-up: Return in about 4 weeks (around 07/25/2016) for abdominal pain .   Boykin Nearing MD

## 2016-06-27 NOTE — Assessment & Plan Note (Signed)
Fioricet ordered Smoking cessation

## 2016-06-27 NOTE — Assessment & Plan Note (Addendum)
Abdominal pain with hematuria R/u renal stone   CT abdomen with and without contrast  Flexeril for suspect MSK component of pain

## 2016-06-27 NOTE — Patient Instructions (Addendum)
Caitlin Valdez was seen today for abdominal pain and vaginal discharge.  Diagnoses and all orders for this visit:  Vaginal discharge -     Cervicovaginal ancillary only  Hematuria, unspecified type -     CT RENAL STONE STUDY; Future  Lower abdominal pain -     CT Abdomen Pelvis Wo Contrast; Future -     cyclobenzaprine (FLEXERIL) 10 MG tablet; Take 1 tablet (10 mg total) by mouth 3 (three) times daily as needed for muscle spasms.  Visit for screening mammogram -     MM DIGITAL SCREENING BILATERAL; Future  Fibromyalgia -     acetaminophen-codeine (TYLENOL #3) 300-30 MG tablet; Take 1 tablet by mouth every 8 (eight) hours as needed for moderate pain.  Migraine without status migrainosus, not intractable, unspecified migraine type -     butalbital-acetaminophen-caffeine (FIORICET, ESGIC) 50-325-40 MG tablet; Take 1 tablet by mouth every 6 (six) hours as needed for headache.  Current smoker   Smoking cessation support: smoking cessation hotline: 1-800-QUIT-NOW.  Smoking cessation classes are available through [facility]. Call [phone] or visit our website at [email or web address]   I suspect muscle spasm Will get a thorough check with CT abdomen and CT renal stone study  Take muscle relaxer for abdominal pain Please complete screening mammogram it was normal in 2016.  F/u in 4 weeks for abdominal pain  Dr. Adrian Blackwater

## 2016-06-27 NOTE — Addendum Note (Signed)
Addended by: Boykin Nearing on: 06/27/2016 10:37 AM   Modules accepted: Orders

## 2016-06-27 NOTE — Progress Notes (Signed)
Pt has found a knot under left breast. Pt has been having migraines. Pt still thinks that she has Bv. Pt is also having stomach pains.  Pt is getting flu shot today.

## 2016-06-28 LAB — CERVICOVAGINAL ANCILLARY ONLY
Chlamydia: NEGATIVE
Neisseria Gonorrhea: NEGATIVE
Trichomonas: NEGATIVE
Wet Prep (BD Affirm): POSITIVE — AB

## 2016-06-30 MED ORDER — METRONIDAZOLE 500 MG PO TABS
500.0000 mg | ORAL_TABLET | Freq: Two times a day (BID) | ORAL | 0 refills | Status: AC
Start: 2016-06-30 — End: 2016-07-07

## 2016-06-30 MED ORDER — FLUCONAZOLE 150 MG PO TABS: 150.0000 mg | ORAL_TABLET | ORAL | 0 refills | Status: DC

## 2016-06-30 NOTE — Addendum Note (Signed)
Addended by: Boykin Nearing on: 06/30/2016 12:17 PM   Modules accepted: Orders

## 2016-07-01 MED FILL — FLUCONAZOLE 150 MG TABLET: 150 | 1 days supply | Qty: 1 | Fill #0

## 2016-07-01 MED FILL — ?METRONIDAZOLE 500 MG TABLE: 500 | 7 days supply | Qty: 14 | Fill #0

## 2016-07-02 ENCOUNTER — Ambulatory Visit (HOSPITAL_COMMUNITY): Admission: RE | Admit: 2016-07-02 | Payer: No Typology Code available for payment source | Source: Ambulatory Visit

## 2016-07-03 ENCOUNTER — Ambulatory Visit: Payer: No Typology Code available for payment source

## 2016-07-04 ENCOUNTER — Telehealth: Payer: Self-pay | Admitting: Family Medicine

## 2016-07-04 NOTE — Telephone Encounter (Signed)
Patient is needing to reschedule her CT scan. Please follow up.

## 2016-07-17 ENCOUNTER — Ambulatory Visit (HOSPITAL_COMMUNITY)
Admission: RE | Admit: 2016-07-17 | Discharge: 2016-07-17 | Disposition: A | Discharge status: Home / Self Care | Payer: No Typology Code available for payment source | Source: Ambulatory Visit | Attending: Family Medicine | Admitting: Family Medicine

## 2016-07-17 DIAGNOSIS — R1030 Lower abdominal pain, unspecified: Secondary | ICD-10-CM

## 2016-07-17 DIAGNOSIS — R319 Hematuria, unspecified: Secondary | ICD-10-CM | POA: Insufficient documentation

## 2016-07-17 DIAGNOSIS — R1012 Left upper quadrant pain: Secondary | ICD-10-CM | POA: Insufficient documentation

## 2016-07-17 DIAGNOSIS — Z23 Encounter for immunization: Secondary | ICD-10-CM

## 2016-07-17 DIAGNOSIS — R102 Pelvic and perineal pain: Secondary | ICD-10-CM | POA: Insufficient documentation

## 2016-07-17 MED ORDER — IOPAMIDOL (ISOVUE-300) INJECTION 61%
INTRAVENOUS | Status: AC
  Administered 2016-07-17: 100 mL via INTRAVENOUS
  Filled 2016-07-17: qty 100

## 2016-07-25 ENCOUNTER — Telehealth: Payer: Self-pay | Admitting: Family Medicine

## 2016-07-25 NOTE — Telephone Encounter (Signed)
Patient called to get CT scan results. Please follow up

## 2016-07-26 NOTE — Telephone Encounter (Signed)
Please call patient CT abdomen normal No mass in abdomen or abdominal wall, no hernia

## 2016-07-29 NOTE — Telephone Encounter (Signed)
Pt was called and informed of CT scan results.

## 2016-09-26 ENCOUNTER — Telehealth: Payer: Self-pay | Admitting: Family Medicine

## 2016-09-26 ENCOUNTER — Other Ambulatory Visit: Payer: Self-pay | Admitting: Family Medicine

## 2016-09-26 DIAGNOSIS — Z1231 Encounter for screening mammogram for malignant neoplasm of breast: Secondary | ICD-10-CM

## 2016-09-26 NOTE — Telephone Encounter (Signed)
Pt calling requesting to speak to nurse in regards to pain in breast. Informed pt that a note was placed to provider for orders for a diagnostic mammogram but pt would still like to speak to nurse  Pt also requesting medication for a recurring bacterial infection and a refill on Tylenol #3

## 2016-09-26 NOTE — Telephone Encounter (Signed)
Caitlin Valdez from the [facility] calling stating the pt came in with pain in her breast and they are requesting orders for a diagnostic mammogram and ultrasound   Can be reached at [phone]

## 2016-10-02 ENCOUNTER — Other Ambulatory Visit: Payer: Self-pay | Admitting: Family Medicine

## 2016-10-02 DIAGNOSIS — N760 Acute vaginitis: Principal | ICD-10-CM

## 2016-10-02 DIAGNOSIS — B9689 Other specified bacterial agents as the cause of diseases classified elsewhere: Secondary | ICD-10-CM

## 2016-10-02 DIAGNOSIS — M797 Fibromyalgia: Secondary | ICD-10-CM

## 2016-10-03 MED FILL — FLUCONAZOLE 150 MG TABLET: 150 | 3 days supply | Qty: 1 | Fill #0

## 2016-10-03 MED FILL — ACETAMINOPHEN/COD #3 TABLET: 300-30 | 30 days supply | Qty: 60 | Fill #0

## 2016-10-03 NOTE — Telephone Encounter (Signed)
Tylenol 3 is ready for pickup

## 2016-10-03 NOTE — Telephone Encounter (Signed)
Pt was called and informed of script being ready for pick up.

## 2016-11-27 ENCOUNTER — Encounter: Payer: Self-pay | Admitting: Family Medicine

## 2016-12-02 ENCOUNTER — Encounter (HOSPITAL_COMMUNITY): Payer: Self-pay | Admitting: Emergency Medicine

## 2016-12-02 DIAGNOSIS — R1013 Epigastric pain: Secondary | ICD-10-CM | POA: Insufficient documentation

## 2016-12-02 DIAGNOSIS — Z7982 Long term (current) use of aspirin: Secondary | ICD-10-CM | POA: Insufficient documentation

## 2016-12-02 DIAGNOSIS — F17210 Nicotine dependence, cigarettes, uncomplicated: Secondary | ICD-10-CM | POA: Insufficient documentation

## 2016-12-02 DIAGNOSIS — Z8673 Personal history of transient ischemic attack (TIA), and cerebral infarction without residual deficits: Secondary | ICD-10-CM | POA: Insufficient documentation

## 2016-12-02 LAB — COMPREHENSIVE METABOLIC PANEL
ALT: 17 U/L (ref 14–54)
AST: 18 U/L (ref 15–41)
Albumin: 4.1 g/dL (ref 3.5–5.0)
Alkaline Phosphatase: 67 U/L (ref 38–126)
Anion gap: 6 (ref 5–15)
BUN: 7 mg/dL (ref 6–20)
CO2: 24 mmol/L (ref 22–32)
Calcium: 9 mg/dL (ref 8.9–10.3)
Chloride: 106 mmol/L (ref 101–111)
Creatinine, Ser: 0.81 mg/dL (ref 0.44–1.00)
GFR calc Af Amer: >60 mL/min (ref >60)
GFR calc non Af Amer: >60 mL/min (ref >60)
Glucose, Bld: 74 mg/dL (ref 65–99)
Potassium: 3.8 mmol/L (ref 3.5–5.1)
Sodium: 136 mmol/L (ref 135–145)
Total Bilirubin: 0.5 mg/dL (ref 0.3–1.2)
Total Protein: 7.3 g/dL (ref 6.5–8.1)

## 2016-12-02 LAB — CBC
HCT: 40.6 % (ref 36.0–46.0)
Hemoglobin: 14.1 g/dL (ref 12.0–15.0)
MCH: 32.7 pg (ref 26.0–34.0)
MCHC: 34.7 g/dL (ref 30.0–36.0)
MCV: 94.2 fL (ref 78.0–100.0)
Platelets: 199 10*3/uL (ref 150–400)
RBC: 4.31 MIL/uL (ref 3.87–5.11)
RDW: 12.4 % (ref 11.5–15.5)
WBC: 6.1 10*3/uL (ref 4.0–10.5)

## 2016-12-02 LAB — LIPASE, BLOOD: Lipase: 41 U/L (ref 11–51)

## 2016-12-02 NOTE — ED Triage Notes (Signed)
Patient c/o worsening fibromyalgia pain, abdominal pain with nausea and some emesis at work.

## 2016-12-03 ENCOUNTER — Emergency Department (HOSPITAL_COMMUNITY)
Admission: EM | Admit: 2016-12-03 | Discharge: 2016-12-03 | Disposition: A | Discharge status: Home / Self Care | Payer: No Typology Code available for payment source | Attending: Emergency Medicine | Admitting: Emergency Medicine

## 2016-12-03 DIAGNOSIS — R1013 Epigastric pain: Secondary | ICD-10-CM

## 2016-12-03 LAB — URINALYSIS, ROUTINE W REFLEX MICROSCOPIC
Bacteria, UA: NONE SEEN
Bilirubin Urine: NEGATIVE
Glucose, UA: NEGATIVE mg/dL
Ketones, ur: NEGATIVE mg/dL
Leukocytes, UA: NEGATIVE
Nitrite: NEGATIVE
Protein, ur: NEGATIVE mg/dL
Specific Gravity, Urine: 1.024 (ref 1.005–1.030)
pH: 5 (ref 5.0–8.0)

## 2016-12-03 MED ORDER — FAMOTIDINE 40 MG PO TABS: 40.0000 mg | ORAL_TABLET | Freq: Every day | ORAL | 0 refills | Status: DC

## 2016-12-03 MED ORDER — ONDANSETRON 8 MG PO TBDP
8.0000 mg | ORAL_TABLET | Freq: Once | ORAL | Status: AC
  Administered 2016-12-03: 8 mg via ORAL
  Filled 2016-12-03: qty 1

## 2016-12-03 MED ORDER — ONDANSETRON 4 MG PO TBDP: 4.0000 mg | ORAL_TABLET | Freq: Three times a day (TID) | ORAL | 0 refills | Status: DC | PRN

## 2016-12-03 MED ORDER — ACETAMINOPHEN 325 MG PO TABS
650.0000 mg | ORAL_TABLET | Freq: Once | ORAL | Status: AC
  Administered 2016-12-03: 650 mg via ORAL
  Filled 2016-12-03: qty 2

## 2016-12-03 MED ORDER — FAMOTIDINE 20 MG PO TABS
40.0000 mg | ORAL_TABLET | Freq: Once | ORAL | Status: AC
  Administered 2016-12-03: 40 mg via ORAL
  Filled 2016-12-03: qty 2

## 2016-12-03 NOTE — ED Provider Notes (Signed)
[facility] DEPT Provider Note   CSN: [id number] Arrival date & time: 12/02/16  2154  By signing my name below, I, [name], attest that this documentation has been prepared under the direction and in the presence of [employer], PA-C. Electronically Signed: Lise Auer, ED Scribe. 12/03/16. 1:56 AM.  History   Chief Complaint Chief Complaint  Patient presents with  . Fibromyalgia  . Abdominal Pain   The history is provided by the patient. No language interpreter was used.    HPI Comments: Caitlin Valdez is a 46 y.o. female with a PMHx of GERD, fibromyalgia, CVA, and PVD, who presents to the Emergency Department complaining of gradually worsening, persistent sharp lower back and abdominal pain that started this afternoon. Pt's associated sx include weakness, nausea, vomiting, constipation, and urinary frequency. Pt notes she has never had this pain before. No treatment tried PTA. No aggravating or alleviating factors. PSHx of  endometrial ablation and cesarean section. She has a hx of hematuria and had a Ct of abdomen in pelvis in Feb which was unremarkable. She is not currently on her period. No EtOH usage. Denies fever, diarrhea , dysuria, vaginal discharge, or vaginal bleeding.   Past Medical History:  Diagnosis Date  . Abdominal pain, right lower quadrant   . Anxiety   . Depression   . Fibromyalgia   . GERD (gastroesophageal reflux disease)   . Migraine   . Obesity   . Peripheral vascular disease (HCC)    RIGHT LEG  . PONV (postoperative nausea and vomiting)   . Stroke ([place])   . Trichomonas 2012    Patient Active Problem List   Diagnosis Date Noted  . Hematuria 06/27/2016  . S/P endometrial ablation 06/27/2016  . Lower abdominal pain 06/27/2016  . Migraine without status migrainosus, not intractable 06/27/2016  . Vitamin D insufficiency 02/11/2016  . De Quervain's tenosynovitis, left 02/09/2016  . Vitamin B12 deficiency 10/17/2015  . Vaginal discharge 10/16/2015   . Allergic rhinitis 12/29/2014  . Current smoker 11/22/2014  . Obesity 06/29/2014  . Spinal stenosis in cervical region 02/22/2014  . Visit for screening mammogram 12/23/2013  . Fibromyalgia 07/15/2009    Past Surgical History:  Procedure Laterality Date  . CESAREAN SECTION    . FRACTURE SURGERY    . HYSTEROSCOPY WITH NOVASURE N/A 08/08/2014   Procedure:  NOVASURE;  Surgeon: Emily Filbert, MD;  Location: [facility];  Service: Gynecology;  Laterality: N/A;  . VEIN SURGERY      OB History    Gravida Para Term Preterm AB Living   2 2 2     2    SAB TAB Ectopic Multiple Live Births                   Home Medications    Prior to Admission medications   Medication Sig Start Date End Date Taking? Authorizing Provider  acetaminophen-codeine (TYLENOL #3) 300-30 MG tablet Take 1 tablet by mouth every 12 (twelve) hours as needed for moderate pain. 10/03/16  Yes Arnoldo Morale, MD  aspirin-acetaminophen-caffeine (EXCEDRIN MIGRAINE) [phone] MG tablet Take 2 tablets by mouth every 6 (six) hours as needed for headache or migraine.   Yes [provider]  butalbital-acetaminophen-caffeine (FIORICET, ESGIC) 50-325-40 MG tablet Take 1 tablet by mouth every 6 (six) hours as needed for headache. 06/27/16  Yes Funches, Josalyn, MD  cyclobenzaprine (FLEXERIL) 10 MG tablet Take 1 tablet (10 mg total) by mouth 3 (three) times daily as needed for muscle spasms. 06/27/16  Yes Funches, Josalyn, MD  fluconazole (DIFLUCAN) 150 MG tablet TAKE 1 TABLET BY MOUTH EVERY 3 DAYS. Patient not taking: Reported on 12/03/2016 10/03/16   Arnoldo Morale, MD  ondansetron (ZOFRAN) 4 MG tablet Take 1 tablet (4 mg total) by mouth every 8 (eight) hours as needed for nausea or vomiting. Patient not taking: Reported on 12/03/2016 06/16/16   Manya Silvas, CNM    Family History Family History  Problem Relation Age of Onset  . Diabetes Mother   . Hypertension Mother   . Diabetes Father   . Hypertension Father   .  Diabetes Other   . Hypertension Other   . Other Neg Hx     Social History Social History  Substance Use Topics  . Smoking status: Current Every Day Smoker    Packs/day: 1.00    Years: 5.00    Types: Cigarettes  . Smokeless tobacco: Never Used  . Alcohol use No     Allergies   Patient has no known allergies.   Review of Systems Review of Systems  Constitutional: Negative for fever.  Gastrointestinal: Positive for abdominal pain, constipation, nausea and vomiting. Negative for diarrhea.  Genitourinary: Positive for frequency. Negative for vaginal bleeding and vaginal discharge.  All other systems reviewed and are negative.    Physical Exam Updated Vital Signs BP 120/77 (BP Location: Left Arm)   Pulse 82   Temp 99.8 F (37.7 C) (Oral)   Resp 19   Ht 5\' 3"  (1.6 m)   Wt 189 lb 8 oz (86 kg)   SpO2 98%   BMI 33.57 kg/m   Physical Exam  Constitutional: She is oriented to person, place, and time. She appears well-developed and well-nourished. No distress.  HENT:  Head: Normocephalic and atraumatic.  Eyes: Conjunctivae are normal. Pupils are equal, round, and reactive to light. Right eye exhibits no discharge. Left eye exhibits no discharge. No scleral icterus.  Neck: Normal range of motion.  Cardiovascular: Normal rate, regular rhythm and normal heart sounds.  Exam reveals no gallop and no friction rub.   No murmur heard. Pulmonary/Chest: Effort normal and breath sounds normal. No respiratory distress. She has no wheezes. She has no rales. She exhibits no tenderness.  Abdominal: Soft. She exhibits no distension and no mass. There is tenderness. There is no rebound and no guarding. No hernia.  Tenderness to the epigastric region with palpation.   Neurological: She is alert and oriented to person, place, and time.  Skin: Skin is warm and dry.  Well healed midline lower abdomen scar.   Psychiatric: She has a normal mood and affect. Her behavior is normal.  Nursing note  and vitals reviewed.    ED Treatments / Results  DIAGNOSTIC STUDIES: Oxygen Saturation is 98% on RA, normal by my interpretation.   COORDINATION OF CARE: 1:30 AM-Discussed next steps with pt. Pt verbalized understanding and is agreeable with the plan.   Labs (all labs ordered are listed, but only abnormal results are displayed) Labs Reviewed  URINALYSIS, ROUTINE W REFLEX MICROSCOPIC - Abnormal; Notable for the following:       Result Value   Hgb urine dipstick MODERATE (*)    Squamous Epithelial / LPF 0-5 (*)    All other components within normal limits  LIPASE, BLOOD  COMPREHENSIVE METABOLIC PANEL  CBC    EKG  EKG Interpretation None       Radiology No results found.  Procedures Procedures (including critical care time)  Medications Ordered in ED Medications -  No data to display   Initial Impression / Assessment and Plan / ED Course  I have reviewed the triage vital signs and the nursing notes.  Pertinent labs & imaging results that were available during my care of the patient were reviewed by me and considered in my medical decision making (see chart for details).  46 year old with epigastric tenderness likely due to gastritis/GERD. Vitals are normal. Labs are unremarkable. UA is clean. She has known hematuria which is unchanged. Abdomen is soft, minimally tender. Will give symptomatic meds and recheck.  After Tylenol, Pepcid, Zofran pt reports feeling better. Will d/c with PCP follow up. Return precautions given.  Final Clinical Impressions(s) / ED Diagnoses   Final diagnoses:  Epigastric pain    New Prescriptions New Prescriptions   No medications on file   I personally performed the services described in this documentation, which was scribed in my presence. The recorded information has been reviewed and is accurate.     Recardo Evangelist, PA-C 12/03/16 [place], April, MD 12/05/16 2304

## 2016-12-13 ENCOUNTER — Ambulatory Visit: Payer: Self-pay | Attending: Family Medicine | Admitting: Family Medicine

## 2016-12-13 ENCOUNTER — Encounter: Payer: Self-pay | Admitting: Family Medicine

## 2016-12-13 VITALS — BP 100/63 | HR 84 | Temp 98.1°F | Wt 191.0 lb

## 2016-12-13 DIAGNOSIS — M797 Fibromyalgia: Secondary | ICD-10-CM | POA: Insufficient documentation

## 2016-12-13 DIAGNOSIS — Z124 Encounter for screening for malignant neoplasm of cervix: Secondary | ICD-10-CM | POA: Insufficient documentation

## 2016-12-13 DIAGNOSIS — Z131 Encounter for screening for diabetes mellitus: Secondary | ICD-10-CM | POA: Insufficient documentation

## 2016-12-13 DIAGNOSIS — R1030 Lower abdominal pain, unspecified: Secondary | ICD-10-CM

## 2016-12-13 DIAGNOSIS — N898 Other specified noninflammatory disorders of vagina: Secondary | ICD-10-CM | POA: Insufficient documentation

## 2016-12-13 DIAGNOSIS — Z7982 Long term (current) use of aspirin: Secondary | ICD-10-CM | POA: Insufficient documentation

## 2016-12-13 DIAGNOSIS — G43909 Migraine, unspecified, not intractable, without status migrainosus: Secondary | ICD-10-CM | POA: Insufficient documentation

## 2016-12-13 DIAGNOSIS — F17210 Nicotine dependence, cigarettes, uncomplicated: Secondary | ICD-10-CM | POA: Insufficient documentation

## 2016-12-13 DIAGNOSIS — R1013 Epigastric pain: Secondary | ICD-10-CM | POA: Insufficient documentation

## 2016-12-13 DIAGNOSIS — Z833 Family history of diabetes mellitus: Secondary | ICD-10-CM | POA: Insufficient documentation

## 2016-12-13 MED ORDER — BUTALBITAL-APAP-CAFFEINE 50-325-40 MG PO TABS: 1.0000 | ORAL_TABLET | Freq: Four times a day (QID) | ORAL | 2 refills | Status: DC | PRN

## 2016-12-13 MED ORDER — NORTRIPTYLINE HCL 25 MG PO CAPS
25.0000 mg | ORAL_CAPSULE | Freq: Every day | ORAL | 2 refills | Status: DC
Start: 2016-12-13 — End: 2017-05-27

## 2016-12-13 MED FILL — NORTRIPTYLINE HCL 25 MG CAP: 25 | 30 days supply | Qty: 30 | Fill #0

## 2016-12-13 MED FILL — BUTALBITAL/APAP/CAFFEINE TA: 50-325-40 | 7 days supply | Qty: 30 | Fill #0

## 2016-12-13 NOTE — Progress Notes (Signed)
Subjective:  Patient ID: Caitlin Valdez, female    DOB: 11-27-1970  Age: 46 y.o. MRN: [id number]  CC: Abdominal Pain   HPI Chesni L Gasser has fibromyalgia, she is a smoker she  presents for   1. ED follow up abdominal pain: she was seen at [facility] ED on 12/03/2016 for epigastric abdominal pain. Her EKG, virals and labs were normal except for moderate Hgb on UA. She was suspected to have gastritis and was prescribed Zofran and Pepcid. She had left sided abdominal pain in 06/2016, CT abdomen was obtained and normal.   She still epigastric pain. She also has pain in her arms, leg and hands. She denies depression. She is working at [employer]. Her shifts changes every 2-3 weeks from days to nights. Currently she gets off a midnight. She goes to bed at 4 AM. She gets out of bed at 11 AM. She reports morning soreness and stiffness. She denies joint swelling. She has to return to work at 3 PM. She is standing at work for 8 hours.   She has tried gabapentin, lyrica, tylenol #3, cymbalta, monthly Toradol shots and and flexeril for her chronic pains. She feels like the tylenol #3 does not control her pain.    2. Pap smear: she is du for repeat  Screening pap smear. She reports occasional vaginal discharge. No odor. No lesions.  .    Social History  Substance Use Topics  . Smoking status: Current Every Day Smoker    Packs/day: 1.00    Years: 5.00    Types: Cigarettes  . Smokeless tobacco: Never Used  . Alcohol use No    Outpatient Medications Prior to Visit  Medication Sig Dispense Refill  . acetaminophen-codeine (TYLENOL #3) 300-30 MG tablet Take 1 tablet by mouth every 12 (twelve) hours as needed for moderate pain. 60 tablet 0  . aspirin-acetaminophen-caffeine (EXCEDRIN MIGRAINE) 250-250-65 MG tablet Take 2 tablets by mouth every 6 (six) hours as needed for headache or migraine.    . butalbital-acetaminophen-caffeine (FIORICET, ESGIC) 50-325-40 MG tablet Take 1 tablet by mouth every 6 (six)  hours as needed for headache. 30 tablet 0  . cyclobenzaprine (FLEXERIL) 10 MG tablet Take 1 tablet (10 mg total) by mouth 3 (three) times daily as needed for muscle spasms. 30 tablet 0  . famotidine (PEPCID) 40 MG tablet Take 1 tablet (40 mg total) by mouth daily. 30 tablet 0  . fluconazole (DIFLUCAN) 150 MG tablet TAKE 1 TABLET BY MOUTH EVERY 3 DAYS. (Patient not taking: Reported on 12/03/2016) 1 tablet 0  . ondansetron (ZOFRAN ODT) 4 MG disintegrating tablet Take 1 tablet (4 mg total) by mouth every 8 (eight) hours as needed for nausea or vomiting. 20 tablet 0  . ondansetron (ZOFRAN) 4 MG tablet Take 1 tablet (4 mg total) by mouth every 8 (eight) hours as needed for nausea or vomiting. (Patient not taking: Reported on 12/03/2016) 20 tablet 1   No facility-administered medications prior to visit.     ROS Review of Systems  Constitutional: Negative for chills and fever.  Eyes: Negative for visual disturbance.  Respiratory: Negative for shortness of breath.   Cardiovascular: Negative for chest pain.  Gastrointestinal: Positive for abdominal pain. Negative for blood in stool.  Genitourinary: Positive for vaginal discharge.  Musculoskeletal: Positive for myalgias. Negative for arthralgias and back pain.  Skin: Negative for rash.  Allergic/Immunologic: Negative for immunocompromised state.  Hematological: Negative for adenopathy. Does not bruise/bleed easily.  Psychiatric/Behavioral: Negative for dysphoric  mood and suicidal ideas.    Objective:  BP 100/63   Pulse 84   Temp 98.1 F (36.7 C) (Oral)   Wt 191 lb (86.6 kg)   SpO2 100%   BMI 33.83 kg/m   BP/Weight 12/13/2016 12/03/2016 2/84/1324  Systolic BP 401 95 -  Diastolic BP 63 61 -  Wt. (Lbs) 191 - 189.5  BMI 33.83 33.57 -   Physical Exam  Constitutional: She is oriented to person, place, and time. She appears well-developed and well-nourished. No distress.  HENT:  Head: Normocephalic and atraumatic.  Cardiovascular: Normal rate,  regular rhythm, normal heart sounds and intact distal pulses.   Pulmonary/Chest: Effort normal and breath sounds normal.  Abdominal: Soft. Bowel sounds are normal. She exhibits no distension. There is no tenderness. There is no rebound and no guarding.  Genitourinary: Uterus normal. Pelvic exam was performed with patient prone. There is no rash, tenderness or lesion on the right labia. There is no rash, tenderness or lesion on the left labia. Cervix exhibits no motion tenderness, no discharge and no friability. Vaginal discharge (scant, thin, white ) found.  Musculoskeletal: She exhibits no edema.  Lymphadenopathy:       Right: No inguinal adenopathy present.       Left: No inguinal adenopathy present.  Neurological: She is alert and oriented to person, place, and time.  Skin: Skin is warm and dry. No rash noted.  Psychiatric: She has a normal mood and affect.   Lab Results  Component Value Date   HGBA1C 5.2 12/23/2013     Assessment & Plan:  Devonna was seen today for abdominal pain.  Diagnoses and all orders for this visit:  Family history of type 2 diabetes mellitus in brother -     POCT glycosylated hemoglobin (Hb A1C)  Papanicolaou smear for cervical cancer screening -     Cytology - PAP  Fibromyalgia -     nortriptyline (PAMELOR) 25 MG capsule; Take 1 capsule (25 mg total) by mouth at bedtime.  Migraine without status migrainosus, not intractable, unspecified migraine type -     butalbital-acetaminophen-caffeine (ESGIC) 50-325-40 MG tablet; Take 1 tablet by mouth every 6 (six) hours as needed for headache.  Vaginal discharge -     metroNIDAZOLE (METROGEL VAGINAL) 0.75 % vaginal gel; Place 1 Applicatorful vaginally 2 (two) times daily. For 7 days  Other orders -     Cervicovaginal ancillary only   There are no diagnoses linked to this encounter.  No orders of the defined types were placed in this encounter.   Follow-up: Return in about 4 weeks (around 01/10/2017) for  fibromyalgia.   Boykin Nearing MD

## 2016-12-13 NOTE — Patient Instructions (Addendum)
Caitlin Valdez was seen today for abdominal pain.  Diagnoses and all orders for this visit:  Family history of type 2 diabetes mellitus in brother -     POCT glycosylated hemoglobin (Hb A1C)  Papanicolaou smear for cervical cancer screening -     Cytology - PAP  Fibromyalgia -     nortriptyline (PAMELOR) 25 MG capsule; Take 1 capsule (25 mg total) by mouth at bedtime.  use Arnica or Arnicare the is over the counter herbal pain relief cream Lactobacillus probiotic recommended to prevent BV  F/u in 4 weeks for fibromyalgia   Dr. Adrian Blackwater

## 2016-12-16 LAB — CERVICOVAGINAL ANCILLARY ONLY

## 2016-12-16 MED ORDER — METRONIDAZOLE 0.75 % VA GEL: 1.0000 | Freq: Two times a day (BID) | VAGINAL | 0 refills | Status: DC

## 2016-12-16 MED FILL — VANDAZOLE VAGINAL 0.75% GEL: 0.75 | 7 days supply | Qty: 70 | Fill #0

## 2016-12-17 LAB — CYTOLOGY - PAP
Chlamydia: NEGATIVE
Diagnosis: NEGATIVE
HPV: NOT DETECTED
Neisseria Gonorrhea: NEGATIVE

## 2016-12-17 LAB — POCT GLYCOSYLATED HEMOGLOBIN (HGB A1C): Hemoglobin A1C: 5.7

## 2016-12-17 NOTE — Assessment & Plan Note (Signed)
Persistent pain with normal work up Normal pelvic exam Reassurance pamelor added for chronic pain control

## 2016-12-17 NOTE — Assessment & Plan Note (Signed)
Moderate discharge Suspect BV metrogel ordered Wet prep done

## 2016-12-17 NOTE — Assessment & Plan Note (Signed)
A: chronic pain in extremities and abdomen P: Add pamelor to help with sleep and pain.  F/u in 4 weeks to assess response to treatment

## 2016-12-18 ENCOUNTER — Telehealth: Payer: Self-pay

## 2016-12-18 NOTE — Telephone Encounter (Signed)
Pt was called and informed of lab results. 

## 2017-02-25 ENCOUNTER — Inpatient Hospital Stay (HOSPITAL_COMMUNITY)
Admission: AD | Admit: 2017-02-25 | Discharge: 2017-02-25 | Disposition: A | Discharge status: Home / Self Care | Payer: Self-pay | Source: Ambulatory Visit | Attending: Family Medicine | Admitting: Family Medicine

## 2017-02-25 ENCOUNTER — Encounter (HOSPITAL_COMMUNITY): Payer: Self-pay | Admitting: *Deleted

## 2017-02-25 DIAGNOSIS — F419 Anxiety disorder, unspecified: Secondary | ICD-10-CM | POA: Insufficient documentation

## 2017-02-25 DIAGNOSIS — F17210 Nicotine dependence, cigarettes, uncomplicated: Secondary | ICD-10-CM | POA: Insufficient documentation

## 2017-02-25 DIAGNOSIS — Z7982 Long term (current) use of aspirin: Secondary | ICD-10-CM | POA: Insufficient documentation

## 2017-02-25 DIAGNOSIS — E669 Obesity, unspecified: Secondary | ICD-10-CM | POA: Insufficient documentation

## 2017-02-25 DIAGNOSIS — M797 Fibromyalgia: Secondary | ICD-10-CM | POA: Insufficient documentation

## 2017-02-25 DIAGNOSIS — R1030 Lower abdominal pain, unspecified: Secondary | ICD-10-CM | POA: Insufficient documentation

## 2017-02-25 DIAGNOSIS — G43909 Migraine, unspecified, not intractable, without status migrainosus: Secondary | ICD-10-CM | POA: Insufficient documentation

## 2017-02-25 DIAGNOSIS — R1010 Upper abdominal pain, unspecified: Secondary | ICD-10-CM | POA: Insufficient documentation

## 2017-02-25 DIAGNOSIS — I739 Peripheral vascular disease, unspecified: Secondary | ICD-10-CM | POA: Insufficient documentation

## 2017-02-25 DIAGNOSIS — N760 Acute vaginitis: Secondary | ICD-10-CM | POA: Insufficient documentation

## 2017-02-25 DIAGNOSIS — F329 Major depressive disorder, single episode, unspecified: Secondary | ICD-10-CM | POA: Insufficient documentation

## 2017-02-25 DIAGNOSIS — B9689 Other specified bacterial agents as the cause of diseases classified elsewhere: Secondary | ICD-10-CM | POA: Insufficient documentation

## 2017-02-25 DIAGNOSIS — K219 Gastro-esophageal reflux disease without esophagitis: Secondary | ICD-10-CM | POA: Insufficient documentation

## 2017-02-25 DIAGNOSIS — Z8673 Personal history of transient ischemic attack (TIA), and cerebral infarction without residual deficits: Secondary | ICD-10-CM | POA: Insufficient documentation

## 2017-02-25 LAB — COMPREHENSIVE METABOLIC PANEL
ALT: 15 U/L (ref 14–54)
AST: 19 U/L (ref 15–41)
Albumin: 3.3 g/dL — ABNORMAL LOW (ref 3.5–5.0)
Alkaline Phosphatase: 58 U/L (ref 38–126)
Anion gap: 8 (ref 5–15)
BUN: 5 mg/dL — ABNORMAL LOW (ref 6–20)
CO2: 22 mmol/L (ref 22–32)
Calcium: 8.4 mg/dL — ABNORMAL LOW (ref 8.9–10.3)
Chloride: 106 mmol/L (ref 101–111)
Creatinine, Ser: 0.85 mg/dL (ref 0.44–1.00)
GFR calc Af Amer: >60 mL/min (ref >60)
GFR calc non Af Amer: >60 mL/min (ref >60)
Glucose, Bld: 100 mg/dL — ABNORMAL HIGH (ref 65–99)
Potassium: 3.8 mmol/L (ref 3.5–5.1)
Sodium: 136 mmol/L (ref 135–145)
Total Bilirubin: 0.5 mg/dL (ref 0.3–1.2)
Total Protein: 5.7 g/dL — ABNORMAL LOW (ref 6.5–8.1)

## 2017-02-25 LAB — POCT PREGNANCY, URINE: Preg Test, Ur: NEGATIVE

## 2017-02-25 LAB — WET PREP, GENITAL
Sperm: NONE SEEN
Trich, Wet Prep: NONE SEEN
WBC, Wet Prep HPF POC: NONE SEEN
Yeast Wet Prep HPF POC: NONE SEEN

## 2017-02-25 LAB — URINALYSIS, ROUTINE W REFLEX MICROSCOPIC
Bacteria, UA: NONE SEEN
Bilirubin Urine: NEGATIVE
Glucose, UA: NEGATIVE mg/dL
Ketones, ur: NEGATIVE mg/dL
Leukocytes, UA: NEGATIVE
Nitrite: NEGATIVE
Protein, ur: NEGATIVE mg/dL
Specific Gravity, Urine: 1.018 (ref 1.005–1.030)
pH: 5 (ref 5.0–8.0)

## 2017-02-25 LAB — CBC
HCT: 41.8 % (ref 36.0–46.0)
Hemoglobin: 14.4 g/dL (ref 12.0–15.0)
MCH: 32.7 pg (ref 26.0–34.0)
MCHC: 34.4 g/dL (ref 30.0–36.0)
MCV: 94.8 fL (ref 78.0–100.0)
Platelets: 216 10*3/uL (ref 150–400)
RBC: 4.41 MIL/uL (ref 3.87–5.11)
RDW: 12.9 % (ref 11.5–15.5)
WBC: 10.5 10*3/uL (ref 4.0–10.5)

## 2017-02-25 MED ORDER — KETOROLAC TROMETHAMINE 60 MG/2ML IM SOLN
60.0000 mg | INTRAMUSCULAR | Status: AC
  Administered 2017-02-25: 60 mg via INTRAMUSCULAR
  Filled 2017-02-25: qty 2

## 2017-02-25 MED ORDER — METRONIDAZOLE 500 MG PO TABS: 500.0000 mg | ORAL_TABLET | Freq: Two times a day (BID) | ORAL | 0 refills | Status: AC

## 2017-02-25 NOTE — MAU Provider Note (Signed)
History     CSN: [id number]  Arrival date and time: 02/25/17 2046   First Provider Initiated Contact with Patient 02/25/17 2109      Chief Complaint  Patient presents with  . Abdominal Pain  . Vaginal Discharge   HPI  Caitlin Valdez is a 46 yo G2P2002 non-pregnant female presenting to MAU tonight with complaints of upper and lower abdominal pain and increased vaginal d/c that started today.  She denies an odor, irritation, or itching.    Past Medical History:  Diagnosis Date  . Abdominal pain, right lower quadrant   . Anxiety   . Depression   . Fibromyalgia   . GERD (gastroesophageal reflux disease)   . Migraine   . Obesity   . Peripheral vascular disease (HCC)    RIGHT LEG  . PONV (postoperative nausea and vomiting)   . Stroke ([place])   . Trichomonas 2012    Past Surgical History:  Procedure Laterality Date  . CESAREAN SECTION    . FRACTURE SURGERY    . HYSTEROSCOPY WITH NOVASURE N/A 08/08/2014   Procedure:  NOVASURE;  Surgeon: Emily Filbert, MD;  Location: [facility];  Service: Gynecology;  Laterality: N/A;  . VEIN SURGERY      Family History  Problem Relation Age of Onset  . Diabetes Mother   . Hypertension Mother   . Diabetes Father   . Hypertension Father   . Diabetes Other   . Hypertension Other   . Other Neg Hx     Social History  Substance Use Topics  . Smoking status: Current Every Day Smoker    Packs/day: 1.00    Years: 5.00    Types: Cigarettes  . Smokeless tobacco: Never Used  . Alcohol use No    Allergies: No Known Allergies  Prescriptions Prior to Admission  Medication Sig Dispense Refill Last Dose  . acetaminophen-codeine (TYLENOL #3) 300-30 MG tablet Take 1 tablet by mouth every 12 (twelve) hours as needed for moderate pain. 60 tablet 0 Taking  . aspirin-acetaminophen-caffeine (EXCEDRIN MIGRAINE) 250-250-65 MG tablet Take 2 tablets by mouth every 6 (six) hours as needed for headache or migraine.   Taking  .  butalbital-acetaminophen-caffeine (ESGIC) 50-325-40 MG tablet Take 1 tablet by mouth every 6 (six) hours as needed for headache. 30 tablet 2   . cyclobenzaprine (FLEXERIL) 10 MG tablet Take 1 tablet (10 mg total) by mouth 3 (three) times daily as needed for muscle spasms. 30 tablet 0 Taking  . metroNIDAZOLE (METROGEL VAGINAL) 0.75 % vaginal gel Place 1 Applicatorful vaginally 2 (two) times daily. For 7 days 70 g 0   . nortriptyline (PAMELOR) 25 MG capsule Take 1 capsule (25 mg total) by mouth at bedtime. 30 capsule 2   . ondansetron (ZOFRAN ODT) 4 MG disintegrating tablet Take 1 tablet (4 mg total) by mouth every 8 (eight) hours as needed for nausea or vomiting. 20 tablet 0 Taking    Review of Systems  Constitutional: Negative.   HENT: Negative.   Eyes: Negative.   Respiratory: Negative.   Cardiovascular: Negative.   Gastrointestinal: Positive for abdominal pain.  Endocrine: Negative.   Genitourinary: Positive for vaginal discharge.  Musculoskeletal: Negative.   Skin: Negative.   Allergic/Immunologic: Negative.   Neurological: Negative.   Hematological: Negative.   Psychiatric/Behavioral: Negative.    Physical Exam   Blood pressure 116/76, pulse 87, temperature 98.3 F (36.8 C), temperature source Oral, resp. rate 18, height 5\' 3"  (1.6 m), weight 84.9 kg (187 lb  4 oz), SpO2 99 %.  Physical Exam  Constitutional: She is oriented to person, place, and time. She appears well-developed and well-nourished.  HENT:  Head: Normocephalic.  Eyes: Pupils are equal, round, and reactive to light.  Neck: Normal range of motion.  Cardiovascular: Normal rate, regular rhythm, normal heart sounds and intact distal pulses.   Respiratory: Effort normal and breath sounds normal.  GI: Soft. Bowel sounds are normal.  Genitourinary:  Genitourinary Comments: Uterus: mildly tender, cx; smooth, pink, no lesions, moderate amt of thin, white d/c, closed/long/firm, no CMT or friability, (+) bilateral adnexal  tenderness  Musculoskeletal: Normal range of motion.  Neurological: She is alert and oriented to person, place, and time.  Skin: Skin is warm and dry.  Psychiatric: She has a normal mood and affect. Her behavior is normal. Judgment and thought content normal.    MAU Course  Procedures  MDM CCUA UPT CBC CMP Wet Prep GC/CT HIV UCx  Results for orders placed or performed during the hospital encounter of 02/25/17 (from the past 24 hour(s))  Urinalysis, Routine w reflex microscopic     Status: Abnormal   Collection Time: 02/25/17  8:58 PM  Result Value Ref Range   Color, Urine YELLOW YELLOW   APPearance CLEAR CLEAR   Specific Gravity, Urine 1.018 1.005 - 1.030   pH 5.0 5.0 - 8.0   Glucose, UA NEGATIVE NEGATIVE mg/dL   Hgb urine dipstick MODERATE (A) NEGATIVE   Bilirubin Urine NEGATIVE NEGATIVE   Ketones, ur NEGATIVE NEGATIVE mg/dL   Protein, ur NEGATIVE NEGATIVE mg/dL   Nitrite NEGATIVE NEGATIVE   Leukocytes, UA NEGATIVE NEGATIVE   RBC / HPF 6-30 0 - 5 RBC/hpf   WBC, UA 0-5 0 - 5 WBC/hpf   Bacteria, UA NONE SEEN NONE SEEN   Squamous Epithelial / LPF 0-5 (A) NONE SEEN   Mucous PRESENT   Comprehensive metabolic panel     Status: Abnormal   Collection Time: 02/25/17  9:06 PM  Result Value Ref Range   Sodium 136 135 - 145 mmol/L   Potassium 3.8 3.5 - 5.1 mmol/L   Chloride 106 101 - 111 mmol/L   CO2 22 22 - 32 mmol/L   Glucose, Bld 100 (H) 65 - 99 mg/dL   BUN 5 (L) 6 - 20 mg/dL   Creatinine, Ser 0.85 0.44 - 1.00 mg/dL   Calcium 8.4 (L) 8.9 - 10.3 mg/dL   Total Protein 5.7 (L) 6.5 - 8.1 g/dL   Albumin 3.3 (L) 3.5 - 5.0 g/dL   AST 19 15 - 41 U/L   ALT 15 14 - 54 U/L   Alkaline Phosphatase 58 38 - 126 U/L   Total Bilirubin 0.5 0.3 - 1.2 mg/dL   GFR calc non Af Amer >60 >60 mL/min   GFR calc Af Amer >60 >60 mL/min   Anion gap 8 5 - 15  Pregnancy, urine POC     Status: None   Collection Time: 02/25/17  9:14 PM  Result Value Ref Range   Preg Test, Ur NEGATIVE NEGATIVE   Wet prep, genital     Status: Abnormal   Collection Time: 02/25/17  9:18 PM  Result Value Ref Range   Yeast Wet Prep HPF POC NONE SEEN NONE SEEN   Trich, Wet Prep NONE SEEN NONE SEEN   Clue Cells Wet Prep HPF POC PRESENT (A) NONE SEEN   WBC, Wet Prep HPF POC NONE SEEN NONE SEEN   Sperm NONE SEEN   CBC  Status: None   Collection Time: 02/25/17  9:40 PM  Result Value Ref Range   WBC 10.5 4.0 - 10.5 K/uL   RBC 4.41 3.87 - 5.11 MIL/uL   Hemoglobin 14.4 12.0 - 15.0 g/dL   HCT 41.8 36.0 - 46.0 %   MCV 94.8 78.0 - 100.0 fL   MCH 32.7 26.0 - 34.0 pg   MCHC 34.4 30.0 - 36.0 g/dL   RDW 12.9 11.5 - 15.5 %   Platelets 216 150 - 400 K/uL     Assessment and Plan  Bacterial vaginitis - Rx Flagyl 500 mg po BID x 7 days - Instructions on BV given - Try soaking in tub with baking soda in water, yogurt topically (in vagina) and taking oral probiotics to help decrease recurrence of BV  Discharge home Patient verbalized an understanding of the plan of care and agrees.   Laury Deep, MSN, CNM 02/25/2017, 9:27 PM

## 2017-02-25 NOTE — Discharge Instructions (Signed)
You can use plain yogurt vaginally, take oral probiotics and soak in tub with baking soda in the water as needed.

## 2017-02-25 NOTE — MAU Note (Signed)
Having sharp pains in upper and lower abd pain, started at 5 pm. Vaginal discharge  was whitish and wet in panties when went to bathroom at 6 pm.

## 2017-02-26 LAB — GC/CHLAMYDIA PROBE AMP (~~LOC~~) NOT AT ARMC
Chlamydia: NEGATIVE
Neisseria Gonorrhea: NEGATIVE

## 2017-02-26 LAB — HIV ANTIBODY (ROUTINE TESTING W REFLEX): HIV Screen 4th Generation wRfx: NONREACTIVE

## 2017-02-26 MED FILL — metroNIDAZOLE 500 MG TABS: 500 | 7 days supply | Qty: 14 | Fill #0

## 2017-02-27 LAB — URINE CULTURE
Culture: NO GROWTH
Special Requests: NORMAL

## 2017-03-17 ENCOUNTER — Emergency Department (HOSPITAL_COMMUNITY)
Admission: EM | Admit: 2017-03-17 | Discharge: 2017-03-17 | Disposition: A | Discharge status: Home / Self Care | Payer: Self-pay | Attending: Emergency Medicine | Admitting: Emergency Medicine

## 2017-03-17 DIAGNOSIS — Z79899 Other long term (current) drug therapy: Secondary | ICD-10-CM | POA: Insufficient documentation

## 2017-03-17 DIAGNOSIS — Z8673 Personal history of transient ischemic attack (TIA), and cerebral infarction without residual deficits: Secondary | ICD-10-CM | POA: Insufficient documentation

## 2017-03-17 DIAGNOSIS — M797 Fibromyalgia: Secondary | ICD-10-CM | POA: Insufficient documentation

## 2017-03-17 DIAGNOSIS — R200 Anesthesia of skin: Secondary | ICD-10-CM | POA: Insufficient documentation

## 2017-03-17 DIAGNOSIS — F17210 Nicotine dependence, cigarettes, uncomplicated: Secondary | ICD-10-CM | POA: Insufficient documentation

## 2017-03-17 MED ORDER — KETOROLAC TROMETHAMINE 30 MG/ML IJ SOLN
30.0000 mg | Freq: Once | INTRAMUSCULAR | Status: AC
  Administered 2017-03-17: 30 mg via INTRAMUSCULAR
  Filled 2017-03-17: qty 1

## 2017-03-17 NOTE — Discharge Instructions (Signed)
You were evaluated in the emergency department for pain similar to previous fibromyalgia flares. You were given a shot of Toradol today. Please call your primary care provider within 3-5 days if symptoms worsen or persist.

## 2017-03-17 NOTE — ED Provider Notes (Signed)
[facility] DEPT Provider Note   CSN: [id number] Arrival date & time: 03/17/17  1731     History   Chief Complaint Chief Complaint  Patient presents with  . Fibromyalgia    HPI Caitlin Valdez is a 46 y.o. female presents to the emergency department for evaluation of "fibromyalgia flare" 3 days. Patient describes "pins and needles" sensation to lower back, hands and feet with occasional numbness to her fingertips and toes. States these symptoms are typical of her fibromyalgia flare that she has had for many years. She takes Tylenol and ibuprofen at home which has not helped. She is requesting a "shot" that her primary care provider usually gives her for these types of flares. Denies fevers, cough, chest pain, abdominal pain.  HPI  Past Medical History:  Diagnosis Date  . Abdominal pain, right lower quadrant   . Anxiety   . Depression   . Fibromyalgia   . GERD (gastroesophageal reflux disease)   . Migraine   . Obesity   . Peripheral vascular disease (HCC)    RIGHT LEG  . PONV (postoperative nausea and vomiting)   . Stroke ([place])   . Trichomonas 2012    Patient Active Problem List   Diagnosis Date Noted  . Bacterial vaginitis 02/25/2017  . Hematuria 06/27/2016  . S/P endometrial ablation 06/27/2016  . Lower abdominal pain 06/27/2016  . Migraine without status migrainosus, not intractable 06/27/2016  . Vitamin D insufficiency 02/11/2016  . De Quervain's tenosynovitis, left 02/09/2016  . Vitamin B12 deficiency 10/17/2015  . Vaginal discharge 10/16/2015  . Allergic rhinitis 12/29/2014  . Current smoker 11/22/2014  . Obesity 06/29/2014  . Spinal stenosis in cervical region 02/22/2014  . Fibromyalgia 07/15/2009    Past Surgical History:  Procedure Laterality Date  . CESAREAN SECTION    . FRACTURE SURGERY    . HYSTEROSCOPY WITH NOVASURE N/A 08/08/2014   Procedure:  NOVASURE;  Surgeon: Emily Filbert, MD;  Location: [facility];  Service: Gynecology;  Laterality: N/A;  .  VEIN SURGERY      OB History    Gravida Para Term Preterm AB Living   2 2 2     2    SAB TAB Ectopic Multiple Live Births                   Home Medications    Prior to Admission medications   Medication Sig Start Date End Date Taking? Authorizing Provider  acetaminophen-codeine (TYLENOL #3) 300-30 MG tablet Take 1 tablet by mouth every 12 (twelve) hours as needed for moderate pain. 10/03/16   Arnoldo Morale, MD  aspirin-acetaminophen-caffeine (EXCEDRIN MIGRAINE) [id number] MG tablet Take 2 tablets by mouth every 6 (six) hours as needed for headache or migraine.    [provider]  butalbital-acetaminophen-caffeine (ESGIC) 50-325-40 MG tablet Take 1 tablet by mouth every 6 (six) hours as needed for headache. 12/13/16   Funches, Adriana Mccallum, MD  cyclobenzaprine (FLEXERIL) 10 MG tablet Take 1 tablet (10 mg total) by mouth 3 (three) times daily as needed for muscle spasms. 06/27/16   Funches, Adriana Mccallum, MD  nortriptyline (PAMELOR) 25 MG capsule Take 1 capsule (25 mg total) by mouth at bedtime. 12/13/16   Funches, Adriana Mccallum, MD  ondansetron (ZOFRAN ODT) 4 MG disintegrating tablet Take 1 tablet (4 mg total) by mouth every 8 (eight) hours as needed for nausea or vomiting. 12/03/16   Recardo Evangelist, PA-C    Family History Family History  Problem Relation Age of Onset  . Diabetes  Mother   . Hypertension Mother   . Diabetes Father   . Hypertension Father   . Diabetes Other   . Hypertension Other   . Other Neg Hx     Social History Social History  Substance Use Topics  . Smoking status: Current Every Day Smoker    Packs/day: 1.00    Years: 5.00    Types: Cigarettes  . Smokeless tobacco: Never Used  . Alcohol use No     Allergies   Patient has no known allergies.   Review of Systems Review of Systems  Constitutional: Negative for fever.  Respiratory: Negative for cough.   Cardiovascular: Negative for chest pain.  Gastrointestinal: Negative for abdominal distention.    Musculoskeletal: Positive for myalgias.  Neurological: Positive for numbness.     Physical Exam Updated Vital Signs BP 117/72 (BP Location: Left Arm)   Pulse 75   Temp 98.8 F (37.1 C) (Oral)   Resp 16   SpO2 97%   Physical Exam  Constitutional: She is oriented to person, place, and time. She appears well-developed and well-nourished. No distress.  Poor eye contact. Patient has her head resting on her hand, keeps her eyes closed during conversation. No acute distress.  HENT:  Head: Normocephalic and atraumatic.  Right Ear: External ear normal.  Left Ear: External ear normal.  Nose: Nose normal.  Eyes: Conjunctivae and EOM are normal. No scleral icterus.  Neck: Normal range of motion. Neck supple.  Cardiovascular: Normal rate, regular rhythm and normal heart sounds.   No murmur heard. Pulmonary/Chest: Effort normal and breath sounds normal. She has no wheezes.  Musculoskeletal: Normal range of motion. She exhibits tenderness. She exhibits no deformity.  Diffuse tenderness to bilateral lumbar spine paraspinal muscles, trapezius, calf and upper extremities.  Neurological: She is alert and oriented to person, place, and time.  Sensation intact to upper extremities and lower extremities 5/5 strength with handgrip and ankle flexion/extension bilaterally  Skin: Skin is warm and dry. Capillary refill takes less than 2 seconds.  Psychiatric: She has a normal mood and affect. Her behavior is normal. Judgment and thought content normal.  Nursing note and vitals reviewed.    ED Treatments / Results  Labs (all labs ordered are listed, but only abnormal results are displayed) Labs Reviewed - No data to display  EKG  EKG Interpretation None       Radiology No results found.  Procedures Procedures (including critical care time)  Medications Ordered in ED Medications  ketorolac (TORADOL) 30 MG/ML injection 30 mg (30 mg Intramuscular Given 03/17/17 2022)     Initial  Impression / Assessment and Plan / ED Course  I have reviewed the triage vital signs and the nursing notes.  Pertinent labs & imaging results that were available during my care of the patient were reviewed by me and considered in my medical decision making (see chart for details).    46 year old female with history of fibromyalgia presents to the ED for evaluation of "flare of fibromyalgia" 3 days described as pins and needles sensation to lower back, upper and lower extremities associated with intermittent numbness to fingers. States these are typical of her fibromyalgia flares that she has had for many years. Takes Tylenol and ibuprofen at home which has not helped. She is requesting a shot of Toradol. Exam is reassuring. Nontoxic-appearing. No other systemic symptoms to raise concern for other etiologies at this time. Toradol was given. Discussed the importance of follow-up with PCP for chronic fibromyalgia pain.  Final Clinical Impressions(s) / ED Diagnoses   Final diagnoses:  Fibromyalgia muscle pain    New Prescriptions New Prescriptions   No medications on file     Arlean Hopping 03/17/17 2026    Malvin Johns, MD 03/17/17 2216

## 2017-03-17 NOTE — ED Triage Notes (Signed)
Pt states that she has a hx of fibromyalgia and is having a flare. Alert and oriented.

## 2017-03-18 ENCOUNTER — Telehealth: Payer: Self-pay | Admitting: General Practice

## 2017-03-18 NOTE — Telephone Encounter (Signed)
Patient has not been seen since June and will need to establish care with a PCP and discuss the letter needs at the visit. Please schedule patient for the next reestablish visit.

## 2017-03-18 NOTE — Telephone Encounter (Signed)
Pt. Called requesting a letter stating that she suffers from Fibromyalgia. Pt. States she needs the letter for her job. Please f/u

## 2017-03-21 ENCOUNTER — Other Ambulatory Visit: Payer: Self-pay | Admitting: Family Medicine

## 2017-03-21 DIAGNOSIS — K219 Gastro-esophageal reflux disease without esophagitis: Secondary | ICD-10-CM

## 2017-04-13 ENCOUNTER — Other Ambulatory Visit: Payer: Self-pay | Admitting: Internal Medicine

## 2017-04-14 ENCOUNTER — Ambulatory Visit: Payer: Self-pay | Admitting: Internal Medicine

## 2017-05-19 IMAGING — CT CT ABD-PEL WO/W CM
1 series · 1 of 1 positions shown · IV contrast (iopamidol)
Comparison: [DATE]

CLINICAL DATA: Evaluate mass and left mid abdomen.

EXAM:
CT ABDOMEN AND PELVIS WITHOUT AND WITH CONTRAST
TECHNIQUE: Multidetector CT imaging of the abdomen and pelvis was performed
following the standard protocol before and following the bolus
administration of intravenous contrast.
CONTRAST:  1 RCTPMS-3BB IOPAMIDOL (RCTPMS-3BB) INJECTION 61%

[Series 100: xyphoid · sagittal · 0.6mm · 0.98mm/px · 1 of 1 slices shown]
[im 1/1]
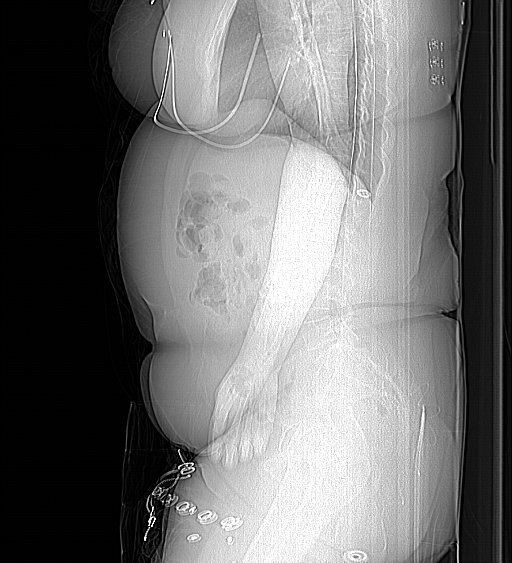

[1 of 1 positions shown; findings below may reference images not displayed]

FINDINGS: Lower chest: No acute abnormality.

Hepatobiliary: No focal liver abnormality is seen. No gallstones,
gallbladder wall thickening, or biliary dilatation.

Pancreas: Unremarkable. No pancreatic ductal dilatation or
surrounding inflammatory changes.

Spleen: Normal in size without focal abnormality.

Adrenals/Urinary Tract: Adrenal glands are unremarkable. Kidneys are
normal, without renal calculi, focal lesion, or hydronephrosis.
Bladder is unremarkable.

Stomach/Bowel: Stomach is within normal limits. Appendix appears
normal. No evidence of bowel wall thickening, distention, or
inflammatory changes.

Vascular/Lymphatic: No significant vascular findings are present. No
enlarged abdominal or pelvic lymph nodes.

Reproductive: Uterus and bilateral adnexa are unremarkable.

Other: No abdominal wall hernia or abnormality. No abdominopelvic
ascites.

Musculoskeletal: No acute or significant osseous findings.
IMPRESSION: 1. No acute findings within the abdomen or pelvis and no abdominal
mass identified.

## 2017-05-27 ENCOUNTER — Encounter: Payer: Self-pay | Admitting: Internal Medicine

## 2017-05-27 ENCOUNTER — Ambulatory Visit: Payer: Self-pay | Attending: Internal Medicine | Admitting: Internal Medicine

## 2017-05-27 VITALS — BP 116/80 | HR 89 | Temp 98.1°F | Resp 16 | Wt 188.2 lb

## 2017-05-27 DIAGNOSIS — E538 Deficiency of other specified B group vitamins: Secondary | ICD-10-CM | POA: Insufficient documentation

## 2017-05-27 DIAGNOSIS — Z1231 Encounter for screening mammogram for malignant neoplasm of breast: Secondary | ICD-10-CM

## 2017-05-27 DIAGNOSIS — K219 Gastro-esophageal reflux disease without esophagitis: Secondary | ICD-10-CM

## 2017-05-27 DIAGNOSIS — M797 Fibromyalgia: Secondary | ICD-10-CM

## 2017-05-27 DIAGNOSIS — Z113 Encounter for screening for infections with a predominantly sexual mode of transmission: Secondary | ICD-10-CM

## 2017-05-27 DIAGNOSIS — R252 Cramp and spasm: Secondary | ICD-10-CM

## 2017-05-27 DIAGNOSIS — Z1239 Encounter for other screening for malignant neoplasm of breast: Secondary | ICD-10-CM

## 2017-05-27 DIAGNOSIS — Z760 Encounter for issue of repeat prescription: Secondary | ICD-10-CM | POA: Insufficient documentation

## 2017-05-27 DIAGNOSIS — F17210 Nicotine dependence, cigarettes, uncomplicated: Secondary | ICD-10-CM | POA: Insufficient documentation

## 2017-05-27 DIAGNOSIS — Z803 Family history of malignant neoplasm of breast: Secondary | ICD-10-CM | POA: Insufficient documentation

## 2017-05-27 DIAGNOSIS — E669 Obesity, unspecified: Secondary | ICD-10-CM | POA: Insufficient documentation

## 2017-05-27 DIAGNOSIS — Z6833 Body mass index (BMI) 33.0-33.9, adult: Secondary | ICD-10-CM | POA: Insufficient documentation

## 2017-05-27 DIAGNOSIS — Z23 Encounter for immunization: Secondary | ICD-10-CM

## 2017-05-27 DIAGNOSIS — G43909 Migraine, unspecified, not intractable, without status migrainosus: Secondary | ICD-10-CM | POA: Insufficient documentation

## 2017-05-27 MED ORDER — METRONIDAZOLE 500 MG PO TABS: 500.0000 mg | ORAL_TABLET | Freq: Two times a day (BID) | ORAL | 0 refills | Status: DC

## 2017-05-27 MED ORDER — MELOXICAM 15 MG PO TABS: 15.0000 mg | ORAL_TABLET | Freq: Every day | ORAL | 3 refills | Status: DC

## 2017-05-27 MED ORDER — CYCLOBENZAPRINE HCL 5 MG PO TABS: 5.0000 mg | ORAL_TABLET | Freq: Two times a day (BID) | ORAL | 1 refills | Status: DC | PRN

## 2017-05-27 MED ORDER — OMEPRAZOLE 20 MG PO CPDR: 20.0000 mg | DELAYED_RELEASE_CAPSULE | Freq: Every day | ORAL | 3 refills | Status: DC

## 2017-05-27 MED ORDER — NORTRIPTYLINE HCL 25 MG PO CAPS: 25.0000 mg | ORAL_CAPSULE | Freq: Every day | ORAL | 2 refills | Status: DC

## 2017-05-27 MED FILL — ?OMEPRAZOLE DR 20 MG CAPSUL: 20 | 30 days supply | Qty: 30 | Fill #0

## 2017-05-27 MED FILL — MELOXICAM 15 MG TABLET: 15 | 30 days supply | Qty: 30 | Fill #0

## 2017-05-27 MED FILL — NORTRIPTYLINE HCL 25 MG CAP: 25 | 30 days supply | Qty: 30 | Fill #0

## 2017-05-27 MED FILL — ?CYCLOBENZAPRINE 5 MG TABLE: 5 | 30 days supply | Qty: 30 | Fill #0

## 2017-05-27 MED FILL — metroNIDAZOLE 500 MG TABS: 500 | 7 days supply | Qty: 14 | Fill #0
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
## 2017-05-27 NOTE — Patient Instructions (Addendum)
Please call  the [facility] (breast and cervical cancer control program) at [phone] to schedule diagnostic mammogram   Influenza Virus Vaccine injection (Fluarix) What is this medicine? INFLUENZA VIRUS VACCINE (in floo EN zuh VAHY ruhs vak SEEN) helps to reduce the risk of getting influenza also known as the flu. This medicine may be used for other purposes; ask your health care provider or pharmacist if you have questions. COMMON BRAND NAME(S): Fluarix, Fluzone What should I tell my health care provider before I take this medicine? They need to know if you have any of these conditions: -bleeding disorder like hemophilia -fever or infection -Guillain-Barre syndrome or other neurological problems -immune system problems -infection with the human immunodeficiency virus (HIV) or AIDS -low blood platelet counts -multiple sclerosis -an unusual or allergic reaction to influenza virus vaccine, eggs, chicken proteins, latex, gentamicin, other medicines, foods, dyes or preservatives -pregnant or trying to get pregnant -breast-feeding How should I use this medicine? This vaccine is for injection into a muscle. It is given by a health care professional. A copy of Vaccine Information Statements will be given before each vaccination. Read this sheet carefully each time. The sheet may change frequently. Talk to your pediatrician regarding the use of this medicine in children. Special care may be needed. Overdosage: If you think you have taken too much of this medicine contact a poison control center or emergency room at once. NOTE: This medicine is only for you. Do not share this medicine with others. What if I miss a dose? This does not apply. What may interact with this medicine? -chemotherapy or radiation therapy -medicines that lower your immune system like etanercept, anakinra, infliximab, and adalimumab -medicines that treat or prevent blood clots like warfarin -phenytoin -steroid medicines  like prednisone or cortisone -theophylline -vaccines This list may not describe all possible interactions. Give your health care provider a list of all the medicines, herbs, non-prescription drugs, or dietary supplements you use. Also tell them if you smoke, drink alcohol, or use illegal drugs. Some items may interact with your medicine. What should I watch for while using this medicine? Report any side effects that do not go away within 3 days to your doctor or health care professional. Call your health care provider if any unusual symptoms occur within 6 weeks of receiving this vaccine. You may still catch the flu, but the illness is not usually as bad. You cannot get the flu from the vaccine. The vaccine will not protect against colds or other illnesses that may cause fever. The vaccine is needed every year. What side effects may I notice from receiving this medicine? Side effects that you should report to your doctor or health care professional as soon as possible: -allergic reactions like skin rash, itching or hives, swelling of the face, lips, or tongue Side effects that usually do not require medical attention (report to your doctor or health care professional if they continue or are bothersome): -fever -headache -muscle aches and pains -pain, tenderness, redness, or swelling at site where injected -weak or tired This list may not describe all possible side effects. Call your doctor for medical advice about side effects. You may report side effects to FDA at 1-800-FDA-1088. Where should I keep my medicine? This vaccine is only given in a clinic, pharmacy, doctor's office, or other health care setting and will not be stored at home. NOTE: This sheet is a summary. It may not cover all possible information. If you have questions about this medicine, talk  to your doctor, pharmacist, or health care provider.  2018 Elsevier/Gold Standard (2008-01-27 09:30:40)  Gastroesophageal Reflux Scan A  gastroesophageal reflux scan is a procedure that is used to check for gastroesophageal reflux, which is the backward flow of stomach contents into the tube that carries food from the mouth to the stomach (esophagus). The scan can also show if any stomach contents are inhaled (aspirated) into your lungs. You may need this scan if you have symptoms such as heartburn, vomiting, swallowing problems, or regurgitation. Regurgitation means that swallowed food is returning from the stomach to the esophagus. For this scan, you will drink a liquid that contains a small amount of a radioactive substance (tracer). A scanner with a camera that detects the radioactive tracer is used to see if any of the material backs up into your esophagus. Tell a health care provider about:  Any allergies you have.  All medicines you are taking, including vitamins, herbs, eye drops, creams, and over-the-counter medicines.  Any blood disorders you have.  Any surgeries you have had.  Any medical conditions you have.  If you are pregnant or you think that you may be pregnant.  If you are breastfeeding. What are the risks? Generally, this is a safe procedure. However, problems may occur, including:  Exposure to radiation (a small amount).  Allergic reaction to the radioactive substance. This is rare.  What happens before the procedure?  Ask your health care provider about changing or stopping your regular medicines. This is especially important if you are taking diabetes medicines or blood thinners.  Follow your health care provider's instructions about eating or drinking restrictions. What happens during the procedure?  You will be asked to drink a liquid that contains a small amount of a radioactive tracer. This liquid will probably be similar to orange juice.  You will assume a position lying on your back.  A series of images will be taken of your esophagus and upper stomach.  You may be asked to move into  different positions to help determine if reflux occurs more often when you are in specific positions.  For adults, an abdominal binder with an inflatable cuff may be placed on the belly (abdomen). This may be used to increase abdominal pressure. More images will be taken to see if the increased pressure causes reflux to occur. The procedure may vary among health care providers and hospitals. What happens after the procedure?  Return to your normal activities and your normal diet as directed by your health care provider.  The radioactive tracer will leave your body over the next few days. Drink enough fluid to keep your urine clear or pale yellow. This will help to flush the tracer out of your body.  It is your responsibility to obtain your test results. Ask your health care provider or the department performing the test when and how you will get your results. This information is not intended to replace advice given to you by your health care provider. Make sure you discuss any questions you have with your health care provider. Document Released: 08/22/2005 Document Revised: 03/25/2016 Document Reviewed: 04/12/2014 Elsevier Interactive Patient Education  [employer].

## 2017-05-28 LAB — HIV ANTIBODY (ROUTINE TESTING W REFLEX): HIV Screen 4th Generation wRfx: NONREACTIVE

## 2017-05-28 LAB — HEPATITIS C ANTIBODY (REFLEX): HCV Ab: <0.1 s/co ratio (ref 0.0–0.9)

## 2017-05-28 LAB — HCV COMMENT:

## 2017-05-29 LAB — CERVICOVAGINAL ANCILLARY ONLY
Bacterial vaginitis: POSITIVE — AB
Chlamydia: NEGATIVE
Neisseria Gonorrhea: NEGATIVE
Trichomonas: NEGATIVE

## 2017-06-12 ENCOUNTER — Other Ambulatory Visit: Payer: Self-pay | Admitting: Internal Medicine

## 2017-06-12 DIAGNOSIS — Z1231 Encounter for screening mammogram for malignant neoplasm of breast: Secondary | ICD-10-CM

## 2017-12-26 ENCOUNTER — Ambulatory Visit (HOSPITAL_COMMUNITY)
Admission: EM | Admit: 2017-12-26 | Discharge: 2017-12-26 | Disposition: A | Discharge status: Home / Self Care | Payer: Self-pay | Attending: Family Medicine | Admitting: Family Medicine

## 2017-12-26 ENCOUNTER — Encounter (HOSPITAL_COMMUNITY): Payer: Self-pay | Admitting: Emergency Medicine

## 2017-12-26 DIAGNOSIS — B354 Tinea corporis: Secondary | ICD-10-CM

## 2017-12-26 MED ORDER — TERBINAFINE HCL 250 MG PO TABS: 250.0000 mg | ORAL_TABLET | Freq: Every day | ORAL | 0 refills | Status: DC

## 2017-12-26 MED ORDER — KETOCONAZOLE 2 % EX CREA: 1.0000 "application " | TOPICAL_CREAM | Freq: Every day | CUTANEOUS | 0 refills | Status: DC

## 2017-12-26 NOTE — Discharge Instructions (Signed)
Take the terbinafine pill once a day for 2 weeks Use the ketoconazole cream daily for 2 weeks The rash should be clear by this time Follow-up if any rash persists

## 2017-12-26 NOTE — ED Provider Notes (Signed)
[place]    CSN: [id number] Arrival date & time: 12/26/17  1714     History   Chief Complaint Chief Complaint  Patient presents with  . Rash    HPI Caitlin Valdez is a 47 y.o. female.   HPI Patient is here for a rash.  She states is present all of her body sparing her face, palms, and soles.  Started off on her ankles.  It was itchy.  She thought it might be insect bites.  She states other people at work and had rash and bites on the ankles.  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  She does not normally have eczema or sensitive skin.  She has not used any new soap lotion powder or product.  She has not taken any medicine for the itching such as Benadryl.  She is using a cortisone cream, regular lotion, calamine, and topical Benadryl on the rash.  In spite of this it is itching.  Is gone from her legs to her trunk and now her arms.  Recent illness.  No sore throat.  No fever or chills.   Past Medical History:  Diagnosis Date  . Abdominal pain, right lower quadrant   . Anxiety   . Depression   . Fibromyalgia   . GERD (gastroesophageal reflux disease)   . Migraine   . Obesity   . Peripheral vascular disease (HCC)    RIGHT LEG  . PONV (postoperative nausea and vomiting)   . Stroke ([place])   . Trichomonas 2012    Patient Active Problem List   Diagnosis Date Noted  . Bacterial vaginitis 02/25/2017  . Hematuria 06/27/2016  . S/P endometrial ablation 06/27/2016  . Lower abdominal pain 06/27/2016  . Migraine without status migrainosus, not intractable 06/27/2016  . Vitamin D insufficiency 02/11/2016  . De Quervain's tenosynovitis, left 02/09/2016  . Vitamin B12 deficiency 10/17/2015  . Vaginal discharge 10/16/2015  . Allergic rhinitis 12/29/2014  . Current smoker 11/22/2014  . Obesity 06/29/2014  . Spinal stenosis in cervical region 02/22/2014  . Fibromyalgia 07/15/2009    Past Surgical History:  Procedure Laterality Date  . CESAREAN SECTION    .  FRACTURE SURGERY    . HYSTEROSCOPY WITH NOVASURE N/A 08/08/2014   Procedure:  NOVASURE;  Surgeon: Emily Filbert, MD;  Location: [facility];  Service: Gynecology;  Laterality: N/A;  . VEIN SURGERY      OB History    Gravida  2   Para  2   Term  2   Preterm      AB      Living  2     SAB      TAB      Ectopic      Multiple      Live Births               Home Medications    Prior to Admission medications   Medication Sig Start Date End Date Taking? Authorizing Provider  ketoconazole (NIZORAL) 2 % cream Apply 1 application topically daily. 12/26/17   Raylene Everts, MD  terbinafine (LAMISIL) 250 MG tablet Take 1 tablet (250 mg total) by mouth daily. 12/26/17   Raylene Everts, MD    Family History Family History  Problem Relation Age of Onset  . Diabetes Mother   . Hypertension Mother   . Diabetes Father   . Hypertension Father   . Diabetes Other   . Hypertension Other   .  Other Neg Hx     Social History Social History   Tobacco Use  . Smoking status: Current Every Day Smoker    Packs/day: 1.00    Years: 5.00    Pack years: 5.00    Types: Cigarettes  . Smokeless tobacco: Never Used  Substance Use Topics  . Alcohol use: No  . Drug use: No     Allergies   Patient has no known allergies.   Review of Systems Review of Systems  Constitutional: Negative for chills and fever.  HENT: Negative for congestion, dental problem, ear pain and sore throat.   Eyes: Negative for pain and visual disturbance.  Respiratory: Negative for cough and shortness of breath.   Cardiovascular: Negative for chest pain and palpitations.  Gastrointestinal: Negative for abdominal pain and vomiting.  Genitourinary: Negative for dysuria and hematuria.  Musculoskeletal: Negative for arthralgias and back pain.  Skin: Positive for rash. Negative for color change.  Neurological: Negative for seizures and syncope.  Psychiatric/Behavioral: Positive for sleep disturbance.        From itching  All other systems reviewed and are negative.    Physical Exam Triage Vital Signs ED Triage Vitals [12/26/17 1728]  Enc Vitals Group     BP (!) 143/87     Pulse Rate 84     Resp 18     Temp 98.5 F (36.9 C)     Temp src      SpO2 99 %     Weight      Height      Head Circumference      Peak Flow      Pain Score 0     Pain Loc      Pain Edu?      Excl. in [place]?    No data found.  Updated Vital Signs BP (!) 143/87   Pulse 84   Temp 98.5 F (36.9 C)   Resp 18   SpO2 99%   Visual Acuity Right Eye Distance:   Left Eye Distance:   Bilateral Distance:    Right Eye Near:   Left Eye Near:    Bilateral Near:     Physical Exam  Constitutional: She appears well-developed and well-nourished. No distress.  HENT:  Head: Normocephalic and atraumatic.  Right Ear: External ear normal.  Left Ear: External ear normal.  Mouth/Throat: Oropharynx is clear and moist.  Eyes: Pupils are equal, round, and reactive to light. Conjunctivae are normal.  Neck: Normal range of motion.  Cardiovascular: Normal rate, regular rhythm and normal heart sounds.  Pulmonary/Chest: Effort normal and breath sounds normal. No respiratory distress. She has no wheezes.  Abdominal: Soft. She exhibits no distension.  Musculoskeletal: Normal range of motion. She exhibits no edema.  Lymphadenopathy:    She has no cervical adenopathy.  Neurological: She is alert.  Skin: Skin is warm and dry.  Trunk back arms and legs are covered with flat, scale lesions. Clear centers, but whole area has been treated with calamine and it is difficult to be certain. Skin is dry.  No vesicles or weeping.  No tenderness or swelling.     UC Treatments / Results  Labs (all labs ordered are listed, but only abnormal results are displayed) Labs Reviewed - No data to display  EKG None  Radiology No results found.  Procedures Procedures (including critical care time)  Medications Ordered in  UC Medications - No data to display  Initial Impression / Assessment and Plan / UC  Course  I have reviewed the triage vital signs and the nursing notes.  Pertinent labs & imaging results that were available during my care of the patient were reviewed by me and considered in my medical decision making (see chart for details).     Discussed with the patient that the calamine does make diagnosis a little bit difficult.  The silver appearance looks a bit like a guttate psoriasis.  She is never had this before, and with no recent sore throat I think it is unlikely.  Her back is covered in lesions, but does not have the characteristic appearance of pityriasis rosea a.  No herald patch identified.  It looks most like a tinea corporis.  We did treat her for this.  If she fails to improve then she may need to go back to her primary care doctor or see a dermatologist. Final Clinical Impressions(s) / UC Diagnoses   Final diagnoses:  Tinea corporis     Discharge Instructions     Take the terbinafine pill once a day for 2 weeks Use the ketoconazole cream daily for 2 weeks The rash should be clear by this time Follow-up if any rash persists   ED Prescriptions    Medication Sig Dispense Auth. Provider   terbinafine (LAMISIL) 250 MG tablet Take 1 tablet (250 mg total) by mouth daily. 14 tablet Raylene Everts, MD   ketoconazole (NIZORAL) 2 % cream Apply 1 application topically daily. 60 g Raylene Everts, MD     Controlled Substance Prescriptions [place] Controlled Substance Registry consulted? Not Applicable   Raylene Everts, MD 12/26/17 [phone]

## 2017-12-26 NOTE — ED Triage Notes (Signed)
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

## 2018-01-01 ENCOUNTER — Emergency Department (HOSPITAL_BASED_OUTPATIENT_CLINIC_OR_DEPARTMENT_OTHER)
Admission: EM | Admit: 2018-01-01 | Discharge: 2018-01-01 | Disposition: A | Discharge status: Home / Self Care | Payer: Self-pay | Attending: Emergency Medicine | Admitting: Emergency Medicine

## 2018-01-01 ENCOUNTER — Other Ambulatory Visit: Payer: Self-pay

## 2018-01-01 ENCOUNTER — Encounter (HOSPITAL_BASED_OUTPATIENT_CLINIC_OR_DEPARTMENT_OTHER): Payer: Self-pay | Admitting: Emergency Medicine

## 2018-01-01 DIAGNOSIS — R21 Rash and other nonspecific skin eruption: Secondary | ICD-10-CM | POA: Insufficient documentation

## 2018-01-01 DIAGNOSIS — Z79899 Other long term (current) drug therapy: Secondary | ICD-10-CM | POA: Insufficient documentation

## 2018-01-01 DIAGNOSIS — F17210 Nicotine dependence, cigarettes, uncomplicated: Secondary | ICD-10-CM | POA: Insufficient documentation

## 2018-01-01 DIAGNOSIS — Z8673 Personal history of transient ischemic attack (TIA), and cerebral infarction without residual deficits: Secondary | ICD-10-CM | POA: Insufficient documentation

## 2018-01-01 MED ORDER — PREDNISONE 20 MG PO TABS: ORAL_TABLET | ORAL | 0 refills | Status: DC

## 2018-01-01 MED ORDER — PREDNISONE 50 MG PO TABS
60.0000 mg | ORAL_TABLET | Freq: Once | ORAL | Status: AC
  Administered 2018-01-01: 60 mg via ORAL
  Filled 2018-01-01: qty 1

## 2018-01-01 MED ORDER — DIPHENHYDRAMINE HCL 25 MG PO TABS: 25.0000 mg | ORAL_TABLET | Freq: Four times a day (QID) | ORAL | 0 refills | Status: DC | PRN

## 2018-01-01 NOTE — ED Triage Notes (Signed)
Eval on 6/14 for rash, worse today

## 2018-01-01 NOTE — ED Provider Notes (Signed)
Emergency Department Provider Note   I have reviewed the triage vital signs and the nursing notes.   HISTORY  Chief Complaint Rash   HPI Caitlin Valdez is a 47 y.o. female with medical problems as documented below the presents to the emergency department today secondary to a rash.  Patient states been gone for 2 or 3 weeks.  She feels like it is gets worse at work.  She thinks is being bit by something but is never seen any bugs.  She states multiple coworkers have the same thing.  She also states it may be related to dust.  She not had any nausea, vomiting, fever, diarrhea.  No rash on her mouth.  Not had a rash like this before. No other associated or modifying symptoms.    Past Medical History:  Diagnosis Date  . Abdominal pain, right lower quadrant   . Anxiety   . Depression   . Fibromyalgia   . GERD (gastroesophageal reflux disease)   . Migraine   . Obesity   . Peripheral vascular disease (HCC)    RIGHT LEG  . PONV (postoperative nausea and vomiting)   . Stroke ([place])   . Trichomonas 2012    Patient Active Problem List   Diagnosis Date Noted  . Bacterial vaginitis 02/25/2017  . Hematuria 06/27/2016  . S/P endometrial ablation 06/27/2016  . Lower abdominal pain 06/27/2016  . Migraine without status migrainosus, not intractable 06/27/2016  . Vitamin D insufficiency 02/11/2016  . De Quervain's tenosynovitis, left 02/09/2016  . Vitamin B12 deficiency 10/17/2015  . Vaginal discharge 10/16/2015  . Allergic rhinitis 12/29/2014  . Current smoker 11/22/2014  . Obesity 06/29/2014  . Spinal stenosis in cervical region 02/22/2014  . Fibromyalgia 07/15/2009    Past Surgical History:  Procedure Laterality Date  . CESAREAN SECTION    . FRACTURE SURGERY    . HYSTEROSCOPY WITH NOVASURE N/A 08/08/2014   Procedure:  NOVASURE;  Surgeon: Emily Filbert, MD;  Location: [facility];  Service: Gynecology;  Laterality: N/A;  . VEIN SURGERY      Current Outpatient Rx  . Order #:  [id number] Class: Normal  . Order #: [id number] Class: Normal  . Order #: [id number] Class: Print  . Order #: [id number] Class: Print    Allergies Dust mite extract  Family History  Problem Relation Age of Onset  . Diabetes Mother   . Hypertension Mother   . Diabetes Father   . Hypertension Father   . Diabetes Other   . Hypertension Other   . Other Neg Hx     Social History Social History   Tobacco Use  . Smoking status: Current Every Day Smoker    Packs/day: 1.00    Years: 5.00    Pack years: 5.00    Types: Cigarettes  . Smokeless tobacco: Never Used  Substance Use Topics  . Alcohol use: No  . Drug use: No    Review of Systems  All other systems negative except as documented in the HPI. All pertinent positives and negatives as reviewed in the HPI. ____________________________________________   PHYSICAL EXAM:  VITAL SIGNS: ED Triage Vitals [01/01/18 0701]  Enc Vitals Group     BP (!) 122/91     Pulse Rate 84     Resp 18     Temp 98.5 F (36.9 C)     Temp Source Oral     SpO2 100 %     Weight 180 lb (81.6 kg)     Height 5'  3" (1.6 m)     Head Circumference      Peak Flow      Pain Score      Pain Loc      Pain Edu?      Excl. in [place]?     Constitutional: Alert and oriented. Well appearing and in no acute distress. Eyes: Conjunctivae are normal. PERRL. EOMI. Head: Atraumatic. Nose: No congestion/rhinnorhea. Mouth/Throat: Mucous membranes are moist.  Oropharynx non-erythematous. Neck: No stridor.  No meningeal signs.   Cardiovascular: Normal rate, regular rhythm. Good peripheral circulation. Grossly normal heart sounds.   Respiratory: Normal respiratory effort.  No retractions. Lungs CTAB. Gastrointestinal: Soft and nontender. No distention.  Musculoskeletal: No lower extremity tenderness nor edema. No gross deformities of extremities. Neurologic:  Normal speech and language. No gross focal neurologic deficits are appreciated.  Skin:  Skin is warm, dry and  intact. Diffuse dry skin, small areas of mild edema. Excoriated.   ____________________________________________   LABS (all labs ordered are listed, but only abnormal results are displayed)  Labs Reviewed - No data to display ____________________________________________  EKG   ____________________________________________  RADIOLOGY  No results found.  ____________________________________________   PROCEDURES  Procedure(s) performed:   Procedures   ____________________________________________   INITIAL IMPRESSION / ASSESSMENT AND PLAN / ED COURSE  Here with rash. Not petechiae or purpura. Does not involve mucous membranes.  Unclear etiology. Patient appears non toxic, well hydrated and in no distress. Doubt meningococcemia, SJS, anaphylaxis, cellulitis, RMSF or toxic shock as cause for rash. Will suggest steroids/benadryl and close pcp/dermatology follow up otherwise return here for new/worsening symptoms.    Pertinent labs & imaging results that were available during my care of the patient were reviewed by me and considered in my medical decision making (see chart for details).  ____________________________________________  FINAL CLINICAL IMPRESSION(S) / ED DIAGNOSES  Final diagnoses:  Rash     MEDICATIONS GIVEN DURING THIS VISIT:  Medications  predniSONE (DELTASONE) tablet 60 mg (has no administration in time range)     NEW OUTPATIENT MEDICATIONS STARTED DURING THIS VISIT:  New Prescriptions   DIPHENHYDRAMINE (BENADRYL) 25 MG TABLET    Take 1 tablet (25 mg total) by mouth every 6 (six) hours as needed for itching.   PREDNISONE (DELTASONE) 20 MG TABLET    3 tabs po daily x 3 days, then 2 tabs x 3 days, then 1.5 tabs x 3 days, then 1 tab x 3 days, then 0.5 tabs x 3 days    Note:  This note was prepared with assistance of Dragon voice recognition software. Occasional wrong-word or sound-a-like substitutions may have occurred due to the inherent  limitations of voice recognition software.   Kemyah Buser, Corene Cornea, MD 01/01/18 0730

## 2018-03-09 ENCOUNTER — Ambulatory Visit (HOSPITAL_COMMUNITY)
Admission: EM | Admit: 2018-03-09 | Discharge: 2018-03-09 | Disposition: A | Discharge status: Home / Self Care | Payer: Self-pay | Attending: Physician Assistant | Admitting: Physician Assistant

## 2018-03-09 ENCOUNTER — Encounter (HOSPITAL_COMMUNITY): Payer: Self-pay | Admitting: Emergency Medicine

## 2018-03-09 ENCOUNTER — Other Ambulatory Visit: Payer: Self-pay

## 2018-03-09 DIAGNOSIS — S40862A Insect bite (nonvenomous) of left upper arm, initial encounter: Secondary | ICD-10-CM

## 2018-03-09 DIAGNOSIS — W57XXXA Bitten or stung by nonvenomous insect and other nonvenomous arthropods, initial encounter: Secondary | ICD-10-CM

## 2018-03-09 MED ORDER — CEPHALEXIN 500 MG PO CAPS: 500.0000 mg | ORAL_CAPSULE | Freq: Four times a day (QID) | ORAL | 0 refills | Status: DC

## 2018-03-09 MED ORDER — MUPIROCIN 2 % EX OINT: 1.0000 "application " | TOPICAL_OINTMENT | Freq: Two times a day (BID) | CUTANEOUS | 0 refills | Status: DC

## 2018-03-09 MED ORDER — MELOXICAM 7.5 MG PO TABS: 7.5000 mg | ORAL_TABLET | Freq: Every day | ORAL | 0 refills | Status: DC

## 2018-03-09 NOTE — ED Triage Notes (Signed)
Woke yesterday with a bump on left forearm.  Bump is below elbow.  Area is swollen, painful and slightly red.  Area does not itch

## 2018-03-09 NOTE — Discharge Instructions (Signed)
No signs of infection at this time. Start Mobic. Do not take ibuprofen (motrin/advil)/ naproxen (aleve) while on mobic. You can apply bactroban to the area to prevent infection. Continue ice compress for the next 3-4 days. If noticing spreading redness, increased warmth, increased swelling, switch to warm compress and start keflex as directed. If still not improving, follow up for further evaluation needed.

## 2018-03-09 NOTE — ED Provider Notes (Signed)
[place]    CSN: [id number] Arrival date & time: 03/09/18  1625     History   Chief Complaint Chief Complaint  Patient presents with  . Insect Bite    HPI ANALYNN DAUM is a 47 y.o. female.   47 year old female comes in for 2 day history of insect bite to the left proximal forearm. States has has swelling, pain, erythema to the area. Denies itching. Denies fever, chills, night sweats. Has been applying ice compress without relief.      Past Medical History:  Diagnosis Date  . Abdominal pain, right lower quadrant   . Anxiety   . Depression   . Fibromyalgia   . GERD (gastroesophageal reflux disease)   . Migraine   . Obesity   . Peripheral vascular disease (HCC)    RIGHT LEG  . PONV (postoperative nausea and vomiting)   . Stroke ([place])   . Trichomonas 2012    Patient Active Problem List   Diagnosis Date Noted  . Bacterial vaginitis 02/25/2017  . Hematuria 06/27/2016  . S/P endometrial ablation 06/27/2016  . Lower abdominal pain 06/27/2016  . Migraine without status migrainosus, not intractable 06/27/2016  . Vitamin D insufficiency 02/11/2016  . De Quervain's tenosynovitis, left 02/09/2016  . Vitamin B12 deficiency 10/17/2015  . Vaginal discharge 10/16/2015  . Allergic rhinitis 12/29/2014  . Current smoker 11/22/2014  . Obesity 06/29/2014  . Spinal stenosis in cervical region 02/22/2014  . Fibromyalgia 07/15/2009    Past Surgical History:  Procedure Laterality Date  . CESAREAN SECTION    . FRACTURE SURGERY    . HYSTEROSCOPY WITH NOVASURE N/A 08/08/2014   Procedure:  NOVASURE;  Surgeon: Emily Filbert, MD;  Location: [facility];  Service: Gynecology;  Laterality: N/A;  . VEIN SURGERY      OB History    Gravida  2   Para  2   Term  2   Preterm      AB      Living  2     SAB      TAB      Ectopic      Multiple      Live Births               Home Medications    Prior to Admission medications   Medication Sig Start Date  End Date Taking? Authorizing Provider  cephALEXin (KEFLEX) 500 MG capsule Take 1 capsule (500 mg total) by mouth 4 (four) times daily. 03/09/18   Tasia Catchings, Cymone Yeske V, PA-C  meloxicam (MOBIC) 7.5 MG tablet Take 1 tablet (7.5 mg total) by mouth daily. 03/09/18   Tasia Catchings, Rickelle Sylvestre V, PA-C  mupirocin ointment (BACTROBAN) 2 % Apply 1 application topically 2 (two) times daily. 03/09/18   Ok Edwards, PA-C    Family History Family History  Problem Relation Age of Onset  . Diabetes Mother   . Hypertension Mother   . Diabetes Father   . Hypertension Father   . Diabetes Other   . Hypertension Other   . Other Neg Hx     Social History Social History   Tobacco Use  . Smoking status: Current Every Day Smoker    Packs/day: 1.00    Years: 5.00    Pack years: 5.00    Types: Cigarettes  . Smokeless tobacco: Never Used  Substance Use Topics  . Alcohol use: No  . Drug use: No     Allergies   Dust mite  extract   Review of Systems Review of Systems  Reason unable to perform ROS: See HPI as above.     Physical Exam Triage Vital Signs ED Triage Vitals  Enc Vitals Group     BP 03/09/18 1705 123/76     Pulse Rate 03/09/18 1705 86     Resp 03/09/18 1705 16     Temp 03/09/18 1705 98.3 F (36.8 C)     Temp Source 03/09/18 1705 Oral     SpO2 03/09/18 1705 100 %     Weight --      Height --      Head Circumference --      Peak Flow --      Pain Score 03/09/18 1723 6     Pain Loc --      Pain Edu? --      Excl. in [place]? --    No data found.  Updated Vital Signs BP 123/76 (BP Location: Right Arm)   Pulse 86   Temp 98.3 F (36.8 C) (Oral)   Resp 16   SpO2 100%   Physical Exam  Constitutional: She is oriented to person, place, and time. She appears well-developed and well-nourished. No distress.  HENT:  Head: Normocephalic and atraumatic.  Eyes: Pupils are equal, round, and reactive to light. Conjunctivae are normal.  Musculoskeletal:  Small wound on the proximal left forearm consistent with  insect bite. Surrounding swelling, erythema, warmth. Tenderness to palpation. No fluctuance, induration.   Neurological: She is alert and oriented to person, place, and time.  Skin: She is not diaphoretic.   UC Treatments / Results  Labs (all labs ordered are listed, but only abnormal results are displayed) Labs Reviewed - No data to display  EKG None  Radiology No results found.  Procedures Procedures (including critical care time)  Medications Ordered in UC Medications - No data to display  Initial Impression / Assessment and Plan / UC Course  I have reviewed the triage vital signs and the nursing notes.  Pertinent labs & imaging results that were available during my care of the patient were reviewed by me and considered in my medical decision making (see chart for details).    No signs of cellulitis/abscess on exam. Discussed symptoms most likely due to inflammation. NSAID, ice compress, antihistamine. Can apply bactroban ointment to prevent infection. Patient states worries for infection in the future and given without insurance, would like to decrease visits here. Will provide Rx of keflex, can start if experiencing spreading erythema, increased warmth. Other return precautions given. Patient expresses understanding and agrees to plan.  Final Clinical Impressions(s) / UC Diagnoses   Final diagnoses:  Insect bite of left upper arm, initial encounter    ED Prescriptions    Medication Sig Dispense Auth. Provider   meloxicam (MOBIC) 7.5 MG tablet Take 1 tablet (7.5 mg total) by mouth daily. 15 tablet Blair Mesina V, PA-C   mupirocin ointment (BACTROBAN) 2 % Apply 1 application topically 2 (two) times daily. 22 g Ebrahim Deremer V, PA-C   cephALEXin (KEFLEX) 500 MG capsule Take 1 capsule (500 mg total) by mouth 4 (four) times daily. 28 capsule Tobin Chad, [place] 03/09/18 1745

## 2018-03-14 ENCOUNTER — Encounter (HOSPITAL_COMMUNITY): Payer: Self-pay | Admitting: Emergency Medicine

## 2018-03-14 ENCOUNTER — Ambulatory Visit (INDEPENDENT_AMBULATORY_CARE_PROVIDER_SITE_OTHER)
Admission: EM | Admit: 2018-03-14 | Discharge: 2018-03-14 | Disposition: A | Discharge status: Home / Self Care | Payer: Self-pay | Source: Home / Self Care | Attending: Family Medicine | Admitting: Family Medicine

## 2018-03-14 DIAGNOSIS — G43909 Migraine, unspecified, not intractable, without status migrainosus: Secondary | ICD-10-CM

## 2018-03-14 DIAGNOSIS — W57XXXD Bitten or stung by nonvenomous insect and other nonvenomous arthropods, subsequent encounter: Secondary | ICD-10-CM

## 2018-03-14 DIAGNOSIS — S40862D Insect bite (nonvenomous) of left upper arm, subsequent encounter: Secondary | ICD-10-CM

## 2018-03-14 MED ORDER — METOCLOPRAMIDE HCL 5 MG/ML IJ SOLN
INTRAMUSCULAR | Status: AC
  Filled 2018-03-14: qty 2

## 2018-03-14 MED ORDER — SULFAMETHOXAZOLE-TRIMETHOPRIM 800-160 MG PO TABS: 1.0000 | ORAL_TABLET | Freq: Two times a day (BID) | ORAL | 0 refills | Status: DC

## 2018-03-14 MED ORDER — METOCLOPRAMIDE HCL 5 MG/ML IJ SOLN
5.0000 mg | Freq: Once | INTRAMUSCULAR | Status: AC
  Administered 2018-03-14: 5 mg via INTRAMUSCULAR

## 2018-03-14 MED ORDER — SUMATRIPTAN SUCCINATE 6 MG/0.5ML ~~LOC~~ SOLN
SUBCUTANEOUS | Status: AC
  Filled 2018-03-14: qty 0.5

## 2018-03-14 MED ORDER — SUMATRIPTAN SUCCINATE 6 MG/0.5ML ~~LOC~~ SOLN: 6.0000 mg | Freq: Once | SUBCUTANEOUS | Status: DC

## 2018-03-14 MED ORDER — DEXAMETHASONE SODIUM PHOSPHATE 10 MG/ML IJ SOLN
10.0000 mg | Freq: Once | INTRAMUSCULAR | Status: AC
  Administered 2018-03-14: 10 mg via INTRAMUSCULAR

## 2018-03-14 MED ORDER — KETOROLAC TROMETHAMINE 60 MG/2ML IM SOLN
INTRAMUSCULAR | Status: AC
  Filled 2018-03-14: qty 2

## 2018-03-14 MED ORDER — KETOROLAC TROMETHAMINE 60 MG/2ML IM SOLN
60.0000 mg | Freq: Once | INTRAMUSCULAR | Status: AC
  Administered 2018-03-14: 60 mg via INTRAMUSCULAR

## 2018-03-14 MED ORDER — DEXAMETHASONE SODIUM PHOSPHATE 10 MG/ML IJ SOLN
INTRAMUSCULAR | Status: AC
  Filled 2018-03-14: qty 1

## 2018-03-14 NOTE — Discharge Instructions (Signed)
Meds ordered this encounter  °Medications  ° ketorolac (TORADOL) injection 60 mg  ° metoCLOPramide (REGLAN) injection 5 mg  ° dexamethasone (DECADRON) injection 10 mg  ° SUMAtriptan (IMITREX) injection 6 mg  ° ° °

## 2018-03-14 NOTE — ED Triage Notes (Signed)
Pt here for spider bite on L elbow, was seen for the same, did not pick up medicine. Pt also c/o headache.

## 2018-03-14 NOTE — ED Provider Notes (Signed)
[place]   [id number] 03/14/18 Arrival Time: 2355  ASSESSMENT & PLAN:  1. Insect bite of left upper arm, subsequent encounter   2. Migraine without status migrainosus, not intractable, unspecified migraine type     Meds ordered this encounter  Medications  . ketorolac (TORADOL) injection 60 mg  . metoCLOPramide (REGLAN) injection 5 mg  . dexamethasone (DECADRON) injection 10 mg  . SUMAtriptan (IMITREX) injection 6 mg  . sulfamethoxazole-trimethoprim (BACTRIM DS,SEPTRA DS) 800-160 MG tablet    Sig: Take 1 tablet by mouth 2 (two) times daily for 10 days.    Dispense:  20 tablet    Refill:  0   ED if acute worsening of headache.  She declines I&D attempt for L arm induration. Prefers trail of antibiotic and will f/u here in 48 hours if needed.  Reviewed expectations re: course of current medical issues. Questions answered. Outlined signs and symptoms indicating need for more acute intervention. Patient verbalized understanding. After Visit Summary given.   SUBJECTIVE:  Caitlin Valdez is a 47 y.o. female who presents with complaint of a migraine without aura. Reports gradual onset 1 day ago. Location: frontal mainly without radiation. History of headaches: yes; reports history of similar migraines requiring visit to doctor. Precipitating factors include: none which have been determined. Associated symptoms: Preceding aura: no. Nausea/vomiting: mild. Vision changes: no. Increased sensitivity to light and to noises: yes. Fever: no. Sinus pressure/congestion: no. Extremity weakness: no. OTC analgesics without much help. This headache is identical to previous migraines.  Also reports questionable 'bug bite' on her L arm. Noticed about 2 days ago. Thinks there is slightly more erythema around this now. Tender. Afebrile. No drainage.  ROS: As per HPI.   OBJECTIVE:  Vitals:   03/14/18 1133  BP: 128/84  Pulse: 78  Resp: 16  Temp: (!) 97.5 F (36.4 C)    SpO2: 100%    General appearance: alert; no distress; appears fatigued; prefers dark room Eyes: PERRLA; EOMI; conjunctiva normal HENT: normocephalic; atraumatic Neck: supple with FROM Lungs: clear to auscultation bilaterally Heart: regular rate and rhythm Extremities: no edema; symmetrical with no gross deformities; LUE with approx 1-1.5 cm area of erythema with mild induration; tender to touch; no bleeding or drainage Skin: warm and dry; around L elbow is small induration; firm and tender to touch; no fluctuance Neurologic: CN 2-12 grossly intact; normal gait; normal symmetric reflexes; normal extremity strength and sensation throughout Psychological: alert and cooperative; normal mood and affect   Allergies  Allergen Reactions  . Dust Mite Extract     Past Medical History:  Diagnosis Date  . Abdominal pain, right lower quadrant   . Anxiety   . Depression   . Fibromyalgia   . GERD (gastroesophageal reflux disease)   . Migraine   . Obesity   . Peripheral vascular disease (HCC)    RIGHT LEG  . PONV (postoperative nausea and vomiting)   . Stroke ([place])   . Trichomonas 2012   Social History   Socioeconomic History  . Marital status: Divorced    Spouse name: Not on file  . Number of children: Not on file  . Years of education: Not on file  . Highest education level: Not on file  Occupational History  . Not on file  Social Needs  . Financial resource strain: Not on file  . Food insecurity:    Worry: Not on file    Inability: Not on file  . Transportation needs:    Medical:  Not on file    Non-medical: Not on file  Tobacco Use  . Smoking status: Current Every Day Smoker    Packs/day: 1.00    Years: 5.00    Pack years: 5.00    Types: Cigarettes  . Smokeless tobacco: Never Used  Substance and Sexual Activity  . Alcohol use: No  . Drug use: No  . Sexual activity: Yes    Birth control/protection: Condom  Lifestyle  . Physical activity:    Days per week: Not on  file    Minutes per session: Not on file  . Stress: Not on file  Relationships  . Social connections:    Talks on phone: Not on file    Gets together: Not on file    Attends religious service: Not on file    Active member of club or organization: Not on file    Attends meetings of clubs or organizations: Not on file    Relationship status: Not on file  . Intimate partner violence:    Fear of current or ex partner: Not on file    Emotionally abused: Not on file    Physically abused: Not on file    Forced sexual activity: Not on file  Other Topics Concern  . Not on file  Social History Narrative  . Not on file   Family History  Problem Relation Age of Onset  . Diabetes Mother   . Hypertension Mother   . Diabetes Father   . Hypertension Father   . Diabetes Other   . Hypertension Other   . Other Neg Hx    Past Surgical History:  Procedure Laterality Date  . CESAREAN SECTION    . FRACTURE SURGERY    . HYSTEROSCOPY WITH NOVASURE N/A 08/08/2014   Procedure:  NOVASURE;  Surgeon: Emily Filbert, MD;  Location: [facility];  Service: Gynecology;  Laterality: N/A;  . VEIN SURGERY       Vanessa Kick, MD 03/16/18 [phone]

## 2018-03-16 ENCOUNTER — Encounter (HOSPITAL_COMMUNITY): Payer: Self-pay

## 2018-03-16 ENCOUNTER — Ambulatory Visit (INDEPENDENT_AMBULATORY_CARE_PROVIDER_SITE_OTHER)
Admission: EM | Admit: 2018-03-16 | Discharge: 2018-03-16 | Disposition: A | Discharge status: Home / Self Care | Payer: Self-pay | Source: Home / Self Care | Attending: Family Medicine | Admitting: Family Medicine

## 2018-03-16 DIAGNOSIS — L02414 Cutaneous abscess of left upper limb: Secondary | ICD-10-CM

## 2018-03-16 DIAGNOSIS — S50862D Insect bite (nonvenomous) of left forearm, subsequent encounter: Secondary | ICD-10-CM

## 2018-03-16 MED ORDER — KETOROLAC TROMETHAMINE 60 MG/2ML IM SOLN
60.0000 mg | Freq: Once | INTRAMUSCULAR | Status: AC
  Administered 2018-03-16: 60 mg via INTRAMUSCULAR

## 2018-03-16 MED ORDER — LIDOCAINE HCL (PF) 1 % IJ SOLN
INTRAMUSCULAR | Status: AC
  Filled 2018-03-16: qty 2

## 2018-03-16 MED ORDER — DOXYCYCLINE HYCLATE 100 MG PO TABS
100.0000 mg | ORAL_TABLET | Freq: Two times a day (BID) | ORAL | Status: DC
  Administered 2018-03-16 (×2): 100 mg via ORAL

## 2018-03-16 MED ORDER — KETOROLAC TROMETHAMINE 60 MG/2ML IM SOLN
INTRAMUSCULAR | Status: AC
  Filled 2018-03-16: qty 2

## 2018-03-16 MED ORDER — DOXYCYCLINE HYCLATE 100 MG PO TABS
ORAL_TABLET | ORAL | Status: AC
  Filled 2018-03-16: qty 4

## 2018-03-16 MED ORDER — CEFTRIAXONE SODIUM 1 G IJ SOLR
1.0000 g | Freq: Once | INTRAMUSCULAR | Status: AC
  Administered 2018-03-16: 1 g via INTRAMUSCULAR

## 2018-03-16 MED ORDER — CEFTRIAXONE SODIUM 1 G IJ SOLR
INTRAMUSCULAR | Status: AC
  Filled 2018-03-16: qty 10

## 2018-03-16 NOTE — ED Provider Notes (Addendum)
[place]   [id number] 03/16/18 Arrival Time: 7425  ASSESSMENT & PLAN:  1. Abscess of forearm, left    Follow-up Information    Schedule an appointment as soon as possible for a visit  with [place].   Why:  See number on provided paperwork.    Discharge Instructions      You have had an abscess drained today and you may have had packing placed in the wound to help the abscess continue to drain at home. If packing was placed, please do not remove it. You may shower with the packing in place. Let the soapy water clean your wound. Do not scrub it. Keep your wound covered. Occupational health will determine follow up for wound check and packing removal. If you do not have a time scheduled with them within 48 hours you may return here. Return to the Urgent Care immediately if you develop any of the following symptoms: fever, Increased redness or swelling around where your abscess was, increased pain, or generalized weakness or vomiting.  Venida Jarvis your antibiotic prescription  as soon as possible. I have given you enough for the next 48 hours; doxycycline 100mg  tablets twice daily.  You have been given the following medications today: Meds ordered this encounter  Medications  . cefTRIAXone (ROCEPHIN) injection 1 g  . ketorolac (TORADOL) injection 60 mg   Incision and Drainage Procedure Note  Anesthesia: 2% plain lidocaine  Procedure Details  The procedure, risks and complications have been discussed in detail (including, but not limited to pain and bleeding) with the patient.  The skin induration was prepped and draped in the usual fashion. After adequate local anesthesia, I&D with a #11 blade was performed on the induration of her proximal L foream. Purulent drainage: present.  EBL: minimal  Drains: none; packing placed  Condition: Tolerated procedure well  Complications: none.  Meds ordered this encounter  Medications  . cefTRIAXone  (ROCEPHIN) injection 1 g  . ketorolac (TORADOL) injection 60 mg  . doxycycline (VIBRA-TABS) tablet 100 mg   OTC analgesics as needed.  Reviewed expectations re: course of current medical issues. Questions answered. Outlined signs and symptoms indicating need for more acute intervention. Patient verbalized understanding. After Visit Summary given.   SUBJECTIVE:  Caitlin Valdez is a 47 y.o. female who returns. I saw her two days ago. Reports that she was bitten by something (questions spider) at work recently. She has not been able to fill the antibiotic prescribed on her previous visit. Reports her worker's comp insurance denied the prescription and she does not have the money to pay for it. She reports increasing redness around her L forearm. Increasing pain around area. No drainage. Reports subjective fever and chills. Any movement of L elbow exacerbates discomfort.  ROS: As per HPI.  OBJECTIVE:  Vitals:   03/16/18 0919  BP: 127/86  Pulse: 74  Resp: 20  Temp: 97.8 F (36.6 C)  TempSrc: Oral  SpO2: 99%    General appearance: alert; no distress Skin: approximately 1.5-2 cm induration of her proximal L forearm; very tender to touch; no active drainage or bleeding; mild surrounding erythema at site of induration She does have painful FROM of L elbow. Psychological: alert and cooperative; normal mood and affect  Allergies  Allergen Reactions  . Dust Mite Extract     Past Medical History:  Diagnosis Date  . Abdominal pain, right lower quadrant   . Anxiety   . Depression   . Fibromyalgia   .  GERD (gastroesophageal reflux disease)   . Migraine   . Obesity   . Peripheral vascular disease (HCC)    RIGHT LEG  . PONV (postoperative nausea and vomiting)   . Stroke ([place])   . Trichomonas 2012   Social History   Socioeconomic History  . Marital status: Divorced    Spouse name: Not on file  . Number of children: Not on file  . Years of education: Not on file  . Highest  education level: Not on file  Occupational History  . Not on file  Social Needs  . Financial resource strain: Not on file  . Food insecurity:    Worry: Not on file    Inability: Not on file  . Transportation needs:    Medical: Not on file    Non-medical: Not on file  Tobacco Use  . Smoking status: Current Every Day Smoker    Packs/day: 1.00    Years: 5.00    Pack years: 5.00    Types: Cigarettes  . Smokeless tobacco: Never Used  Substance and Sexual Activity  . Alcohol use: No  . Drug use: No  . Sexual activity: Yes    Birth control/protection: Condom  Lifestyle  . Physical activity:    Days per week: Not on file    Minutes per session: Not on file  . Stress: Not on file  Relationships  . Social connections:    Talks on phone: Not on file    Gets together: Not on file    Attends religious service: Not on file    Active member of club or organization: Not on file    Attends meetings of clubs or organizations: Not on file    Relationship status: Not on file  Other Topics Concern  . Not on file  Social History Narrative  . Not on file   Family History  Problem Relation Age of Onset  . Diabetes Mother   . Hypertension Mother   . Diabetes Father   . Hypertension Father   . Diabetes Other   . Hypertension Other   . Other Neg Hx    Past Surgical History:  Procedure Laterality Date  . CESAREAN SECTION    . FRACTURE SURGERY    . HYSTEROSCOPY WITH NOVASURE N/A 08/08/2014   Procedure:  NOVASURE;  Surgeon: Emily Filbert, MD;  Location: [facility];  Service: Gynecology;  Laterality: N/A;  . VEIN SURGERY             Vanessa Kick, MD 03/16/18 1037    Vanessa Kick, MD 03/16/18 1037

## 2018-03-16 NOTE — Discharge Instructions (Addendum)
You have had an abscess drained today and you may have had packing placed in the wound to help the abscess continue to drain at home. If packing was placed, please do not remove it. You may shower with the packing in place. Let the soapy water clean your wound. Do not scrub it. Keep your wound covered. Occupational health will determine follow up for wound check and packing removal. If you do not have a time scheduled with them within 48 hours you may return here. Return to the Urgent Care immediately if you develop any of the following symptoms: fever, Increased redness or swelling around where your abscess was, increased pain, or generalized weakness or vomiting.  Fill your antibiotic prescription as soon as possible. I have given you enough for the next 48 hours. Take Doxycycline 100mg  tablets twice daily.  You have been given the following medications today: Meds ordered this encounter  Medications   cefTRIAXone (ROCEPHIN) injection 1 g   ketorolac (TORADOL) injection 60 mg

## 2018-03-16 NOTE — ED Triage Notes (Signed)
Pt presents feeling worse from a follow up visit from a few days with a spider bite to left elbow area.  Pt presents in more pain and and more swelling .

## 2018-03-17 ENCOUNTER — Other Ambulatory Visit: Payer: Self-pay

## 2018-03-17 ENCOUNTER — Encounter (HOSPITAL_COMMUNITY): Payer: Self-pay | Admitting: Emergency Medicine

## 2018-03-17 ENCOUNTER — Ambulatory Visit (HOSPITAL_COMMUNITY)
Admission: EM | Admit: 2018-03-17 | Discharge: 2018-03-17 | Disposition: A | Discharge status: Home / Self Care | Payer: Self-pay | Source: Home / Self Care

## 2018-03-17 ENCOUNTER — Inpatient Hospital Stay (HOSPITAL_COMMUNITY)
Admission: EM | Admit: 2018-03-17 | Discharge: 2018-03-21 | DRG: 603 | Disposition: A | Discharge status: Home / Self Care | Payer: Self-pay | Attending: Internal Medicine | Admitting: Internal Medicine

## 2018-03-17 ENCOUNTER — Emergency Department (HOSPITAL_COMMUNITY): Payer: Self-pay

## 2018-03-17 DIAGNOSIS — F17200 Nicotine dependence, unspecified, uncomplicated: Secondary | ICD-10-CM | POA: Diagnosis present

## 2018-03-17 DIAGNOSIS — F419 Anxiety disorder, unspecified: Secondary | ICD-10-CM | POA: Diagnosis present

## 2018-03-17 DIAGNOSIS — Z8673 Personal history of transient ischemic attack (TIA), and cerebral infarction without residual deficits: Secondary | ICD-10-CM

## 2018-03-17 DIAGNOSIS — G43909 Migraine, unspecified, not intractable, without status migrainosus: Secondary | ICD-10-CM | POA: Diagnosis present

## 2018-03-17 DIAGNOSIS — F329 Major depressive disorder, single episode, unspecified: Secondary | ICD-10-CM | POA: Diagnosis present

## 2018-03-17 DIAGNOSIS — I739 Peripheral vascular disease, unspecified: Secondary | ICD-10-CM | POA: Diagnosis present

## 2018-03-17 DIAGNOSIS — Z713 Dietary counseling and surveillance: Secondary | ICD-10-CM

## 2018-03-17 DIAGNOSIS — B9562 Methicillin resistant Staphylococcus aureus infection as the cause of diseases classified elsewhere: Secondary | ICD-10-CM | POA: Diagnosis present

## 2018-03-17 DIAGNOSIS — M797 Fibromyalgia: Secondary | ICD-10-CM | POA: Diagnosis present

## 2018-03-17 DIAGNOSIS — Z8249 Family history of ischemic heart disease and other diseases of the circulatory system: Secondary | ICD-10-CM

## 2018-03-17 DIAGNOSIS — F32A Depression, unspecified: Secondary | ICD-10-CM

## 2018-03-17 DIAGNOSIS — Z6833 Body mass index (BMI) 33.0-33.9, adult: Secondary | ICD-10-CM

## 2018-03-17 DIAGNOSIS — L03119 Cellulitis of unspecified part of limb: Secondary | ICD-10-CM | POA: Diagnosis present

## 2018-03-17 DIAGNOSIS — E669 Obesity, unspecified: Secondary | ICD-10-CM | POA: Diagnosis present

## 2018-03-17 DIAGNOSIS — K219 Gastro-esophageal reflux disease without esophagitis: Secondary | ICD-10-CM | POA: Diagnosis present

## 2018-03-17 DIAGNOSIS — L03114 Cellulitis of left upper limb: Principal | ICD-10-CM | POA: Diagnosis present

## 2018-03-17 DIAGNOSIS — R110 Nausea: Secondary | ICD-10-CM | POA: Diagnosis not present

## 2018-03-17 DIAGNOSIS — Z716 Tobacco abuse counseling: Secondary | ICD-10-CM

## 2018-03-17 DIAGNOSIS — K5900 Constipation, unspecified: Secondary | ICD-10-CM | POA: Diagnosis present

## 2018-03-17 DIAGNOSIS — L02414 Cutaneous abscess of left upper limb: Secondary | ICD-10-CM

## 2018-03-17 DIAGNOSIS — F17210 Nicotine dependence, cigarettes, uncomplicated: Secondary | ICD-10-CM | POA: Diagnosis present

## 2018-03-17 DIAGNOSIS — T63301D Toxic effect of unspecified spider venom, accidental (unintentional), subsequent encounter: Secondary | ICD-10-CM

## 2018-03-17 LAB — CBC WITH DIFFERENTIAL/PLATELET
Basophils Absolute: 0 10*3/uL (ref 0.0–0.1)
Basophils Relative: 0 %
Eosinophils Absolute: 0.1 10*3/uL (ref 0.0–0.7)
Eosinophils Relative: 1 %
HCT: 39.5 % (ref 36.0–46.0)
Hemoglobin: 13.4 g/dL (ref 12.0–15.0)
Lymphocytes Relative: 32 %
Lymphs Abs: 3.5 10*3/uL (ref 0.7–4.0)
MCH: 32.8 pg (ref 26.0–34.0)
MCHC: 33.9 g/dL (ref 30.0–36.0)
MCV: 96.8 fL (ref 78.0–100.0)
Monocytes Absolute: 1.1 10*3/uL — ABNORMAL HIGH (ref 0.1–1.0)
Monocytes Relative: 10 %
Neutro Abs: 6.3 10*3/uL (ref 1.7–7.7)
Neutrophils Relative %: 57 %
Platelets: 271 10*3/uL (ref 150–400)
RBC: 4.08 MIL/uL (ref 3.87–5.11)
RDW: 12.6 % (ref 11.5–15.5)
WBC: 10.9 10*3/uL — ABNORMAL HIGH (ref 4.0–10.5)

## 2018-03-17 LAB — COMPREHENSIVE METABOLIC PANEL
ALT: 12 U/L (ref 0–44)
AST: 12 U/L — ABNORMAL LOW (ref 15–41)
Albumin: 3.2 g/dL — ABNORMAL LOW (ref 3.5–5.0)
Alkaline Phosphatase: 72 U/L (ref 38–126)
Anion gap: 9 (ref 5–15)
BUN: 9 mg/dL (ref 6–20)
CO2: 23 mmol/L (ref 22–32)
Calcium: 8.8 mg/dL — ABNORMAL LOW (ref 8.9–10.3)
Chloride: 110 mmol/L (ref 98–111)
Creatinine, Ser: 0.75 mg/dL (ref 0.44–1.00)
GFR calc Af Amer: >60 mL/min (ref >60)
GFR calc non Af Amer: >60 mL/min (ref >60)
Glucose, Bld: 91 mg/dL (ref 70–99)
Potassium: 3.6 mmol/L (ref 3.5–5.1)
Sodium: 142 mmol/L (ref 135–145)
Total Bilirubin: 0.6 mg/dL (ref 0.3–1.2)
Total Protein: 6.3 g/dL — ABNORMAL LOW (ref 6.5–8.1)

## 2018-03-17 LAB — I-STAT CG4 LACTIC ACID, ED
Lactic Acid, Venous: 1.16 mmol/L (ref 0.5–1.9)
Lactic Acid, Venous: 1.43 mmol/L (ref 0.5–1.9)

## 2018-03-17 LAB — TSH: TSH: 3.297 u[IU]/mL (ref 0.350–4.500)

## 2018-03-17 LAB — I-STAT BETA HCG BLOOD, ED (MC, WL, AP ONLY): I-stat hCG, quantitative: <5 m[IU]/mL (ref <5)

## 2018-03-17 MED ORDER — DOCUSATE SODIUM 100 MG PO CAPS
100.0000 mg | ORAL_CAPSULE | Freq: Two times a day (BID) | ORAL | Status: DC
  Administered 2018-03-17 – 2018-03-20 (×6): 100 mg via ORAL
  Filled 2018-03-17 (×8): qty 1

## 2018-03-17 MED ORDER — PIPERACILLIN-TAZOBACTAM 3.375 G IVPB 30 MIN
3.3750 g | INTRAVENOUS | Status: AC
  Administered 2018-03-17: 3.375 g via INTRAVENOUS
  Filled 2018-03-17: qty 50

## 2018-03-17 MED ORDER — ONDANSETRON HCL 4 MG PO TABS
4.0000 mg | ORAL_TABLET | Freq: Four times a day (QID) | ORAL | Status: DC | PRN
  Administered 2018-03-19: 4 mg via ORAL
  Filled 2018-03-17: qty 1

## 2018-03-17 MED ORDER — SODIUM CHLORIDE 0.9 % IV BOLUS (SEPSIS)
1000.0000 mL | Freq: Once | INTRAVENOUS | Status: AC
  Administered 2018-03-17: 1000 mL via INTRAVENOUS

## 2018-03-17 MED ORDER — VANCOMYCIN HCL IN DEXTROSE 1-5 GM/200ML-% IV SOLN: 1000.0000 mg | Freq: Once | INTRAVENOUS | Status: DC

## 2018-03-17 MED ORDER — MUPIROCIN 2 % EX OINT
1.0000 "application " | TOPICAL_OINTMENT | Freq: Two times a day (BID) | CUTANEOUS | Status: DC
  Administered 2018-03-18 – 2018-03-20 (×6): 1 via TOPICAL
  Filled 2018-03-17 (×5): qty 22

## 2018-03-17 MED ORDER — FENTANYL CITRATE (PF) 100 MCG/2ML IJ SOLN
25.0000 ug | INTRAMUSCULAR | Status: DC | PRN
  Administered 2018-03-18: 25 ug via INTRAVENOUS
  Filled 2018-03-17: qty 2

## 2018-03-17 MED ORDER — PIPERACILLIN-TAZOBACTAM 3.375 G IVPB 30 MIN: 3.3750 g | Freq: Once | INTRAVENOUS | Status: DC

## 2018-03-17 MED ORDER — LIDOCAINE-EPINEPHRINE (PF) 2 %-1:200000 IJ SOLN
10.0000 mL | Freq: Once | INTRAMUSCULAR | Status: AC
  Administered 2018-03-17: 10 mL

## 2018-03-17 MED ORDER — HYDROCODONE-ACETAMINOPHEN 5-325 MG PO TABS
1.0000 | ORAL_TABLET | ORAL | Status: DC | PRN
  Administered 2018-03-17 – 2018-03-21 (×16): 2 via ORAL
  Filled 2018-03-17 (×16): qty 2

## 2018-03-17 MED ORDER — VANCOMYCIN HCL 10 G IV SOLR
1250.0000 mg | INTRAVENOUS | Status: DC
  Administered 2018-03-18 – 2018-03-20 (×3): 1250 mg via INTRAVENOUS
  Filled 2018-03-17 (×3): qty 1250

## 2018-03-17 MED ORDER — MORPHINE SULFATE (PF) 2 MG/ML IV SOLN
2.0000 mg | Freq: Once | INTRAVENOUS | Status: AC
  Administered 2018-03-17: 2 mg via INTRAVENOUS
  Filled 2018-03-17: qty 1

## 2018-03-17 MED ORDER — ONDANSETRON HCL 4 MG/2ML IJ SOLN
4.0000 mg | Freq: Four times a day (QID) | INTRAMUSCULAR | Status: DC | PRN
  Administered 2018-03-18 – 2018-03-20 (×4): 4 mg via INTRAVENOUS
  Filled 2018-03-17 (×4): qty 2

## 2018-03-17 MED ORDER — PIPERACILLIN-TAZOBACTAM 3.375 G IVPB
3.3750 g | Freq: Three times a day (TID) | INTRAVENOUS | Status: DC
  Administered 2018-03-17 – 2018-03-20 (×8): 3.375 g via INTRAVENOUS
  Filled 2018-03-17 (×9): qty 50

## 2018-03-17 MED ORDER — BACITRACIN ZINC 500 UNIT/GM EX OINT
TOPICAL_OINTMENT | Freq: Two times a day (BID) | CUTANEOUS | Status: DC
  Administered 2018-03-17: 1 via TOPICAL
  Administered 2018-03-18 – 2018-03-20 (×6): via TOPICAL
  Filled 2018-03-17 (×16): qty 28.35

## 2018-03-17 MED ORDER — CLINDAMYCIN PHOSPHATE 600 MG/50ML IV SOLN
600.0000 mg | Freq: Once | INTRAVENOUS | Status: AC
  Administered 2018-03-17: 600 mg via INTRAVENOUS
  Filled 2018-03-17: qty 50

## 2018-03-17 MED ORDER — ACETAMINOPHEN 325 MG PO TABS
650.0000 mg | ORAL_TABLET | Freq: Four times a day (QID) | ORAL | Status: DC | PRN
  Filled 2018-03-17: qty 2

## 2018-03-17 MED ORDER — SODIUM CHLORIDE 0.9 % IV SOLN
1000.0000 mL | INTRAVENOUS | Status: DC
  Administered 2018-03-17 – 2018-03-19 (×3): 1000 mL via INTRAVENOUS

## 2018-03-17 MED ORDER — ENOXAPARIN SODIUM 40 MG/0.4ML ~~LOC~~ SOLN
40.0000 mg | SUBCUTANEOUS | Status: DC
  Administered 2018-03-18 – 2018-03-20 (×3): 40 mg via SUBCUTANEOUS
  Filled 2018-03-17 (×3): qty 0.4

## 2018-03-17 MED ORDER — ACETAMINOPHEN 650 MG RE SUPP: 650.0000 mg | Freq: Four times a day (QID) | RECTAL | Status: DC | PRN

## 2018-03-17 MED ORDER — VANCOMYCIN HCL 10 G IV SOLR
1750.0000 mg | INTRAVENOUS | Status: AC
  Administered 2018-03-17: 1750 mg via INTRAVENOUS
  Filled 2018-03-17: qty 1750

## 2018-03-17 NOTE — ED Notes (Signed)
Pt was reports that she was bitten by a spider on  03/06/18. Pt reports that she was seen on urgent care on the 03/09/18 and the area was lanced and drained yesterday. Pt reports that she was placed on ABX. Pt has swelling noted to left arm by he elbow. That is tender with touch and a 10/10 pain at this time. Pt is tearful.

## 2018-03-17 NOTE — ED Notes (Signed)
Reported called to 3W to AMY, RN

## 2018-03-17 NOTE — Consult Note (Addendum)
ORTHOPAEDIC CONSULTATION HISTORY & PHYSICAL REQUESTING PHYSICIAN: Kerney Elbe, DO  Chief Complaint: L forearm abscess  HPI: Caitlin Valdez is a 47 y.o. female who presented to the emergency department today with pain and swelling of the left upper extremity.  She was initially evaluated at [facility] urgent care on 03-09-18, where she reported having sustained some type of insect bite at work.  She was prescribed oral antibiotics, but reports that [employer] would not cover them, so she did not fill the prescription privately.  She presented again to [facility] urgent care on 03-14-18, where this lack of oral antibiotics was noted, and she declined incision and drainage, opting for oral medications.  She returned to urgent care on 03-16-18, not yet improving, and a limited incision and drainage was performed.  Records indicate a packing was placed. She presented back to the urgent care today, where she was referred to occupational health, who then referred her to the emergency department.  Hand surgery was consulted regarding the possible need for more extensive drainage of the infection.  The packing has not yet been pulled. Patient reports that she has been taking her antibiotics as prescribed starting on Saturday, the 31st.  Past Medical History:  Diagnosis Date  . Abdominal pain, right lower quadrant   . Anxiety   . Depression   . Fibromyalgia   . GERD (gastroesophageal reflux disease)   . Migraine   . Obesity   . Peripheral vascular disease (HCC)    RIGHT LEG  . PONV (postoperative nausea and vomiting)   . Stroke ([place])   . Trichomonas 2012   Past Surgical History:  Procedure Laterality Date  . CESAREAN SECTION    . FRACTURE SURGERY    . HYSTEROSCOPY WITH NOVASURE N/A 08/08/2014   Procedure:  NOVASURE;  Surgeon: Emily Filbert, MD;  Location: [facility];  Service: Gynecology;  Laterality: N/A;  . VEIN SURGERY     Social History   Socioeconomic History  . Marital  status: Divorced    Spouse name: Not on file  . Number of children: Not on file  . Years of education: Not on file  . Highest education level: Not on file  Occupational History  . Not on file  Social Needs  . Financial resource strain: Not on file  . Food insecurity:    Worry: Not on file    Inability: Not on file  . Transportation needs:    Medical: Not on file    Non-medical: Not on file  Tobacco Use  . Smoking status: Current Every Day Smoker    Packs/day: 1.00    Years: 5.00    Pack years: 5.00    Types: Cigarettes  . Smokeless tobacco: Never Used  Substance and Sexual Activity  . Alcohol use: No  . Drug use: No  . Sexual activity: Yes    Birth control/protection: Condom  Lifestyle  . Physical activity:    Days per week: Not on file    Minutes per session: Not on file  . Stress: Not on file  Relationships  . Social connections:    Talks on phone: Not on file    Gets together: Not on file    Attends religious service: Not on file    Active member of club or organization: Not on file    Attends meetings of clubs or organizations: Not on file    Relationship status: Not on file  Other Topics Concern  . Not on  file  Social History Narrative  . Not on file   Family History  Problem Relation Age of Onset  . Diabetes Mother   . Hypertension Mother   . Diabetes Father   . Hypertension Father   . Diabetes Other   . Hypertension Other   . Other Neg Hx    Allergies  Allergen Reactions  . Dust Mite Extract Other (See Comments)    Allergy test 10 years ago   Prior to Admission medications   Medication Sig Start Date End Date Taking? Authorizing Provider  ibuprofen (ADVIL,MOTRIN) 200 MG tablet Take 600 mg by mouth every 6 (six) hours as needed for headache.   Yes [provider]  meloxicam (MOBIC) 7.5 MG tablet Take 1 tablet (7.5 mg total) by mouth daily. 03/09/18  Yes Yu, Amy V, PA-C  mupirocin ointment (BACTROBAN) 2 % Apply 1 application topically 2  (two) times daily. 03/09/18  Yes Yu, Amy V, PA-C  sulfamethoxazole-trimethoprim (BACTRIM DS,SEPTRA DS) 800-160 MG tablet Take 1 tablet by mouth 2 (two) times daily for 10 days. 03/14/18 03/24/18 Yes Vanessa Kick, MD   Dg Forearm Left  Result Date: 03/17/2018 CLINICAL DATA:  Bitten by a spider with area of swelling there was lanced and drained yesterday, the patient has been on antibiotic treatment EXAM: LEFT FOREARM - 2 VIEW COMPARISON:  None. FINDINGS: No acute fracture is seen. Alignment is normal. No elbow joint effusion is noted. There is a focus of soft tissue swelling on the lateral view along the dorsal aspect near the elbow joint which may represent the area of spider bite. Some higher attenuation material is noted within this which may represent cleansing solution or overlapping bandage. Clinical correlation is recommended. No underlying bony abnormality is seen. IMPRESSION: Focus of soft tissue swelling along the dorsal aspect of the proximal left forearm as noted above probably at the site of spider bite. No underlying bony abnormality. Electronically Signed   By: Ivar Drape M.D.   On: 03/17/2018 13:52   Dg Humerus Left  Result Date: 03/17/2018 CLINICAL DATA:  Bitten by spider, recent [place] of wound with drainage, recent antibiotic treatment EXAM: LEFT HUMERUS - 2+ VIEW COMPARISON:  None. FINDINGS: Views of the left humerus show no abnormality. No significant soft tissue swelling is seen and no bony abnormality is noted. IMPRESSION: Negative. Electronically Signed   By: Ivar Drape M.D.   On: 03/17/2018 13:52    Positive ROS: All other systems have been reviewed and were otherwise negative with the exception of those mentioned in the HPI and as above.  Physical Exam: Vitals: Refer to EMR. Constitutional:  WD, WN, NAD HEENT:  NCAT, EOMI Neuro/Psych:  Alert & oriented to person, place, and time; appropriate mood & affect Lymphatic: No generalized extremity edema or  lymphadenopathy Extremities / MSK:  The extremities are normal with respect to appearance, ranges of motion, joint stability, muscle strength/tone, sensation, & perfusion except as otherwise noted:  There is some subcutaneous edema that extends from the dorsum of the hand along the dorsal forearm to just above the elbow.  The center of the process appears to be midway between the mid forearm and the olecranon process, dorsal radial on the forearm.  There appears to be a dry packing within the center of the wound where a few drops of purulent fluid can be expressed.  The central area is 3 to 4 cm in diameter, raised and indurated, with surrounding subcutaneous edema.  NVI.  Assessment: Left forearm  subcutaneous abscess, with packing in place, worsening despite initiation recently of oral antibiotics  Recommendations/Procedure: I discussed these findings with her.  She last ate a sausage biscuit approximately 10 AM.  I recommended exploration with drainage and irrigation at the bedside with local anesthetic.  She consented.  2% lidocaine with epinephrine was instilled as a ring block surrounding the periphery of the indurated tissues.  The wound margins were cleansed and cultures obtained from the expressible fluid.  The packing had already been pulled, and with some firm pressure around the margins of the indurated area, some additional purulence could be expressed.  Probing dissection was carried out with a hemostat, spreading in the subcutaneous plane to ensure that the surrounding area could open into the central open wound, which was elongated so that it was 2 cm in length.  It was then copiously irrigated and moistened gauze placed within the wound as a packing.  Dry dressing was placed over it.  I discussed with her the ways in which surgical drainage can augment to assist in infection resolution, with initial debulking of the infection load and providing a route for sustained egress, until the  immune system and antibiotics get the infection under control.  I have left instructions for wound care for nursing, to include pulling the packing tomorrow evening and beginning cleansing and redressing of the wound.  I encouraged the patient and range of motion exercises to prevent elbow stiffness.  The small wound would be expected to close over the course of a couple of weeks fairly uneventfully as the infection comes under control.  Clearly, she will require initially IV antibiotics, transitioning to appropriate oral antibiotics and close outpatient supervision arranged to ensure that the treatment underway is achieving appropriate results.  From a surgical perspective, she does not need routine follow-up.  I will sign-off for now, but I remain available as might be needed for questions/concerns regarding further surgical intervention.   Rayvon Char [place] [address], [place],   [id number] Office: [phone] Mobile: [phone]  03/17/2018, 3:32 PM

## 2018-03-17 NOTE — ED Triage Notes (Signed)
Pt given shot yesterday of antibiotics and still on oral antibiotics but having increase in swelling and pain in left arm. Pt in arm sling. Urgent care sent to ED due to not able give IV medications.

## 2018-03-17 NOTE — ED Provider Notes (Signed)
[facility] DEPT Provider Note   CSN: [id number] Arrival date & time: 03/17/18  1212     History   Chief Complaint Chief Complaint  Patient presents with  . Arm Swelling  . Headache    HPI Caitlin Valdez is a 47 y.o. female.  HPI  47 yo female with spider bite 8/23, presents today with increased swelling, fever, and weakness 8/26 increased swelling, seen at [facility] treated with meloxicam,doxycycline, and bactroban cream, seen again 8/31 felt weak and fever, returned to ucc with increased swelling.  Treated with abx.  REturned yesterday and had I and D. Today with increased swelling of arm and to face.  Sent to ED.  Sent to occupational medicine and told to come to ED for admission. Patient reports taking doxycycline on Saturday, then starting bactrim yesterday.  Past Medical History:  Diagnosis Date  . Abdominal pain, right lower quadrant   . Anxiety   . Depression   . Fibromyalgia   . GERD (gastroesophageal reflux disease)   . Migraine   . Obesity   . Peripheral vascular disease (HCC)    RIGHT LEG  . PONV (postoperative nausea and vomiting)   . Stroke ([place])   . Trichomonas 2012    Patient Active Problem List   Diagnosis Date Noted  . Bacterial vaginitis 02/25/2017  . Hematuria 06/27/2016  . S/P endometrial ablation 06/27/2016  . Lower abdominal pain 06/27/2016  . Migraine without status migrainosus, not intractable 06/27/2016  . Vitamin D insufficiency 02/11/2016  . De Quervain's tenosynovitis, left 02/09/2016  . Vitamin B12 deficiency 10/17/2015  . Vaginal discharge 10/16/2015  . Allergic rhinitis 12/29/2014  . Current smoker 11/22/2014  . Obesity 06/29/2014  . Spinal stenosis in cervical region 02/22/2014  . Fibromyalgia 07/15/2009    Past Surgical History:  Procedure Laterality Date  . CESAREAN SECTION    . FRACTURE SURGERY    . HYSTEROSCOPY WITH NOVASURE N/A 08/08/2014   Procedure:  NOVASURE;  Surgeon: Emily Filbert, MD;   Location: [facility];  Service: Gynecology;  Laterality: N/A;  . VEIN SURGERY       OB History    Gravida  2   Para  2   Term  2   Preterm      AB      Living  2     SAB      TAB      Ectopic      Multiple      Live Births               Home Medications    Prior to Admission medications   Medication Sig Start Date End Date Taking? Authorizing Provider  meloxicam (MOBIC) 7.5 MG tablet Take 1 tablet (7.5 mg total) by mouth daily. 03/09/18   Tasia Catchings, Amy V, PA-C  mupirocin ointment (BACTROBAN) 2 % Apply 1 application topically 2 (two) times daily. 03/09/18   Tasia Catchings, Amy V, PA-C  sulfamethoxazole-trimethoprim (BACTRIM DS,SEPTRA DS) 800-160 MG tablet Take 1 tablet by mouth 2 (two) times daily for 10 days. 03/14/18 03/24/18  Vanessa Kick, MD    Family History Family History  Problem Relation Age of Onset  . Diabetes Mother   . Hypertension Mother   . Diabetes Father   . Hypertension Father   . Diabetes Other   . Hypertension Other   . Other Neg Hx     Social History Social History   Tobacco Use  . Smoking status: Current Every  Day Smoker    Packs/day: 1.00    Years: 5.00    Pack years: 5.00    Types: Cigarettes  . Smokeless tobacco: Never Used  Substance Use Topics  . Alcohol use: No  . Drug use: No     Allergies   Dust mite extract   Review of Systems Review of Systems   Physical Exam Updated Vital Signs BP 140/89 (BP Location: Right Arm)   Pulse 99   Temp 98.4 F (36.9 C) (Oral)   Resp 17   Ht 1.6 m (5\' 3" )   Wt 83.9 kg   SpO2 100%   BMI 32.77 kg/m   Physical Exam  Constitutional: She is oriented to person, place, and time. She appears well-developed and well-nourished.  HENT:  Head: Normocephalic and atraumatic.  Mouth/Throat: Oropharynx is clear and moist.  Eyes: Pupils are equal, round, and reactive to light. EOM are normal.  Neck: Normal range of motion. Neck supple.  Cardiovascular: Normal rate, regular rhythm and normal heart  sounds.  Pulmonary/Chest: Effort normal and breath sounds normal.  Abdominal: Soft.  Musculoskeletal: Normal range of motion.  Neurological: She is alert and oriented to person, place, and time. She has normal strength.  Skin: Skin is warm.  Small area open from I and d over left elbow SWelling distally to finger and proximally to shoulder with induration and diffuse swelling and ttp  Psychiatric: She has a normal mood and affect.  Nursing note and vitals reviewed.    ED Treatments / Results  Labs (all labs ordered are listed, but only abnormal results are displayed) Labs Reviewed  CBC WITH DIFFERENTIAL/PLATELET - Abnormal; Notable for the following components:      Result Value   WBC 10.9 (*)    Monocytes Absolute 1.1 (*)    All other components within normal limits  COMPREHENSIVE METABOLIC PANEL  I-STAT CG4 LACTIC ACID, ED  I-STAT BETA HCG BLOOD, ED ([facility], [facility], [facility] ONLY)    EKG None  Radiology Dg Forearm Left  Result Date: 03/17/2018 CLINICAL DATA:  Bitten by a spider with area of swelling there was lanced and drained yesterday, the patient has been on antibiotic treatment EXAM: LEFT FOREARM - 2 VIEW COMPARISON:  None. FINDINGS: No acute fracture is seen. Alignment is normal. No elbow joint effusion is noted. There is a focus of soft tissue swelling on the lateral view along the dorsal aspect near the elbow joint which may represent the area of spider bite. Some higher attenuation material is noted within this which may represent cleansing solution or overlapping bandage. Clinical correlation is recommended. No underlying bony abnormality is seen. IMPRESSION: Focus of soft tissue swelling along the dorsal aspect of the proximal left forearm as noted above probably at the site of spider bite. No underlying bony abnormality. Electronically Signed   By: Ivar Drape M.D.   On: 03/17/2018 13:52   Dg Humerus Left  Result Date: 03/17/2018 CLINICAL DATA:  Bitten by spider, recent [place] of  wound with drainage, recent antibiotic treatment EXAM: LEFT HUMERUS - 2+ VIEW COMPARISON:  None. FINDINGS: Views of the left humerus show no abnormality. No significant soft tissue swelling is seen and no bony abnormality is noted. IMPRESSION: Negative. Electronically Signed   By: Ivar Drape M.D.   On: 03/17/2018 13:52    Procedures Procedures (including critical care time)  Medications Ordered in ED Medications - No data to display   Initial Impression / Assessment and Plan / ED Course  I  have reviewed the triage vital signs and the nursing notes.  Pertinent labs & imaging results that were available during my care of the patient were reviewed by me and considered in my medical decision making (see chart for details).    Discussed with Orion Crook, PA-C and Dr. Grandville Silos, on-call for hand surgery and patient will be seen in consult Plan admission to hospitalist for treatment   Final Clinical Impressions(s) / ED Diagnoses   Final diagnoses:  None    ED Discharge Orders    None       Pattricia Boss, MD 03/17/18 1555

## 2018-03-17 NOTE — ED Notes (Signed)
ED TO INPATIENT HANDOFF REPORT  Name/Age/Gender Caitlin Valdez 47 y.o. female  Code Status   Home/SNF/Other Home  Chief Complaint w/c arm infection  Level of Care/Admitting Diagnosis ED Disposition    ED Disposition Condition Comment   Admit  Hospital Area: [facility] [[id number]]  Level of Care: Med-Surg [16]  Diagnosis: Cellulitis of arm [803212]  Admitting Physician: Raiford Noble LATIF [[id number]]  Attending Physician: Raiford Noble LATIF [[id number]]  Estimated length of stay: past midnight tomorrow  Certification:: I certify this patient will need inpatient services for at least 2 midnights  PT Class (Do Not Modify): Inpatient [101]  PT Acc Code (Do Not Modify): Private [1]       Medical History Past Medical History:  Diagnosis Date  . Abdominal pain, right lower quadrant   . Anxiety   . Depression   . Fibromyalgia   . GERD (gastroesophageal reflux disease)   . Migraine   . Obesity   . Peripheral vascular disease (HCC)    RIGHT LEG  . PONV (postoperative nausea and vomiting)   . Stroke ([place])   . Trichomonas 2012    Allergies Allergies  Allergen Reactions  . Dust Mite Extract Other (See Comments)    Allergy test 10 years ago    IV Location/Drains/Wounds Patient Lines/Drains/Airways Status   Active Line/Drains/Airways    Name:   Placement date:   Placement time:   Site:   Days:   Peripheral IV 03/17/18 Right Forearm   03/17/18    1455    Forearm   less than 1          Labs/Imaging Results for orders placed or performed during the hospital encounter of 03/17/18 (from the past 48 hour(s))  Comprehensive metabolic panel     Status: Abnormal   Collection Time: 03/17/18 12:39 PM  Result Value Ref Range   Sodium 142 135 - 145 mmol/L   Potassium 3.6 3.5 - 5.1 mmol/L   Chloride 110 98 - 111 mmol/L   CO2 23 22 - 32 mmol/L   Glucose, Bld 91 70 - 99 mg/dL   BUN 9 6 - 20 mg/dL   Creatinine, Ser 0.75 0.44 - 1.00 mg/dL   Calcium 8.8 (L)  8.9 - 10.3 mg/dL   Total Protein 6.3 (L) 6.5 - 8.1 g/dL   Albumin 3.2 (L) 3.5 - 5.0 g/dL   AST 12 (L) 15 - 41 U/L   ALT 12 0 - 44 U/L   Alkaline Phosphatase 72 38 - 126 U/L   Total Bilirubin 0.6 0.3 - 1.2 mg/dL   GFR calc non Af Amer >60 >60 mL/min   GFR calc Af Amer >60 >60 mL/min    Comment: (NOTE) The eGFR has been calculated using the CKD EPI equation. This calculation has not been validated in all clinical situations. eGFR's persistently <60 mL/min signify possible Chronic Kidney Disease.    Anion gap 9 5 - 15    Comment: Performed at [facility], [place] [address], [place], [place] [address]  CBC with Differential     Status: Abnormal   Collection Time: 03/17/18 12:39 PM  Result Value Ref Range   WBC 10.9 (H) 4.0 - 10.5 K/uL   RBC 4.08 3.87 - 5.11 MIL/uL   Hemoglobin 13.4 12.0 - 15.0 g/dL   HCT 39.5 36.0 - 46.0 %   MCV 96.8 78.0 - 100.0 fL   MCH 32.8 26.0 - 34.0 pg   MCHC 33.9 30.0 - 36.0 g/dL  RDW 12.6 11.5 - 15.5 %   Platelets 271 150 - 400 K/uL   Neutrophils Relative % 57 %   Neutro Abs 6.3 1.7 - 7.7 K/uL   Lymphocytes Relative 32 %   Lymphs Abs 3.5 0.7 - 4.0 K/uL   Monocytes Relative 10 %   Monocytes Absolute 1.1 (H) 0.1 - 1.0 K/uL   Eosinophils Relative 1 %   Eosinophils Absolute 0.1 0.0 - 0.7 K/uL   Basophils Relative 0 %   Basophils Absolute 0.0 0.0 - 0.1 K/uL    Comment: Performed at [facility], [place] [address], [place], [place] [address]  I-Stat beta hCG blood, ED     Status: None   Collection Time: 03/17/18 12:45 PM  Result Value Ref Range   I-stat hCG, quantitative <5.0 <5 mIU/mL   Comment 3            Comment:   GEST. AGE      CONC.  (mIU/mL)   <=1 WEEK        5 - 50     2 WEEKS       50 - 500     3 WEEKS       100 - 10,000     4 WEEKS     1,000 - 30,000        FEMALE AND NON-PREGNANT FEMALE:     LESS THAN 5 mIU/mL   I-Stat CG4 Lactic Acid, ED     Status: None   Collection Time: 03/17/18 12:47 PM  Result  Value Ref Range   Lactic Acid, Venous 1.16 0.5 - 1.9 mmol/L  I-Stat CG4 Lactic Acid, ED     Status: None   Collection Time: 03/17/18  3:19 PM  Result Value Ref Range   Lactic Acid, Venous 1.43 0.5 - 1.9 mmol/L   Dg Forearm Left  Result Date: 03/17/2018 CLINICAL DATA:  Bitten by a spider with area of swelling there was lanced and drained yesterday, the patient has been on antibiotic treatment EXAM: LEFT FOREARM - 2 VIEW COMPARISON:  None. FINDINGS: No acute fracture is seen. Alignment is normal. No elbow joint effusion is noted. There is a focus of soft tissue swelling on the lateral view along the dorsal aspect near the elbow joint which may represent the area of spider bite. Some higher attenuation material is noted within this which may represent cleansing solution or overlapping bandage. Clinical correlation is recommended. No underlying bony abnormality is seen. IMPRESSION: Focus of soft tissue swelling along the dorsal aspect of the proximal left forearm as noted above probably at the site of spider bite. No underlying bony abnormality. Electronically Signed   By: Ivar Drape M.D.   On: 03/17/2018 13:52   Dg Humerus Left  Result Date: 03/17/2018 CLINICAL DATA:  Bitten by spider, recent [place] of wound with drainage, recent antibiotic treatment EXAM: LEFT HUMERUS - 2+ VIEW COMPARISON:  None. FINDINGS: Views of the left humerus show no abnormality. No significant soft tissue swelling is seen and no bony abnormality is noted. IMPRESSION: Negative. Electronically Signed   By: Ivar Drape M.D.   On: 03/17/2018 13:52    Pending Labs Unresulted Labs (From admission, onward)    Start     Ordered   03/18/18 0500  Creatinine, serum  Daily,   R     03/17/18 1643   03/17/18 1524  Aerobic Culture (superficial specimen)  Once,   R     03/17/18 1524   03/17/18  1328  Blood culture (routine x 2)  BLOOD CULTURE X 2,   STAT     03/17/18 1327   Signed and Held  HIV antibody  Once,   R    Comments:  HIV  screening recommended for all admitted patients    Signed and Held   Signed and Held  CBC  (enoxaparin (LOVENOX)    CrCl >/= 30 ml/min)  Once,   R    Comments:  Baseline for enoxaparin therapy IF NOT ALREADY DRAWN.  Notify MD if PLT < 100 K.    Signed and Held   Signed and Held  Creatinine, serum  (enoxaparin (LOVENOX)    CrCl >/= 30 ml/min)  Once,   R    Comments:  Baseline for enoxaparin therapy IF NOT ALREADY DRAWN.    Signed and Held   Signed and Held  Creatinine, serum  (enoxaparin (LOVENOX)    CrCl >/= 30 ml/min)  Weekly,   R    Comments:  while on enoxaparin therapy    Signed and Held   Signed and Held  Magnesium  Tomorrow morning,   R     Signed and Held   Signed and Held  Phosphorus  Tomorrow morning,   R     Signed and Held   Signed and Held  TSH  Add-on,   R     Signed and Held   Signed and Held  Hemoglobin A1c  Tomorrow morning,   R     Signed and Held   Signed and Held  Comprehensive metabolic panel  Tomorrow morning,   R     Signed and Held   Signed and Held  CBC  Tomorrow morning,   R     Signed and Held          Vitals/Pain Today's Vitals   03/17/18 1221 03/17/18 1600 03/17/18 1619 03/17/18 1701  BP: 140/89     Pulse: 99 85    Resp: 17     Temp: 98.4 F (36.9 C)     TempSrc: Oral     SpO2: 100% 97%    Weight: 83.9 kg     Height: _0  (1.6 m)     PainSc:   10-Worst pain ever 8     Isolation Precautions No active isolations  Medications Medications  sodium chloride 0.9 % bolus 1,000 mL (1,000 mLs Intravenous New Bag/Given 03/17/18 1640)    Followed by  sodium chloride 0.9 % bolus 1,000 mL (0 mLs Intravenous Stopped 03/17/18 1603)    Followed by  0.9 %  sodium chloride infusion (0 mLs Intravenous Stopped 03/17/18 1706)  bacitracin ointment (1 application Topical Given 03/17/18 1536)  HYDROcodone-acetaminophen (NORCO/VICODIN) 5-325 MG per tablet 1-2 tablet (2 tablets Oral Given 03/17/18 1619)  ondansetron (ZOFRAN) tablet 4 mg (has no administration in  time range)    Or  ondansetron (ZOFRAN) injection 4 mg (has no administration in time range)  fentaNYL (SUBLIMAZE) injection 25 mcg (has no administration in time range)  piperacillin-tazobactam (ZOSYN) IVPB 3.375 g (3.375 g Intravenous New Bag/Given 03/17/18 1639)  vancomycin (VANCOCIN) 1,750 mg in sodium chloride 0.9 % 500 mL IVPB (has no administration in time range)  piperacillin-tazobactam (ZOSYN) IVPB 3.375 g (has no administration in time range)  clindamycin (CLEOCIN) IVPB 600 mg (0 mg Intravenous Stopped 03/17/18 1603)  lidocaine-EPINEPHrine (XYLOCAINE W/EPI) 2 %-1:200000 (PF) injection 10 mL (10 mLs Infiltration Given 03/17/18 1451)  morphine 2 MG/ML injection 2 mg (2 mg Intravenous  Given 03/17/18 1457)    Mobility walks

## 2018-03-17 NOTE — Progress Notes (Addendum)
Pharmacy Antibiotic Note  Caitlin Valdez is a 47 y.o. female admitted on 03/17/2018 with cellulitis.  Patient with recent bite of unknown insect/spider and recently started oral antibiotics, did not improve and underwent a limited I&D on 9/2.  Returns to ED today with worsening of infection.  Pharmacy has been consulted for Vancomycin and Zosyn dosing.  Plan:  Zosyn 3.375gm IV x 1 dose over 25min in the ED followed by 3.375gm IV q8h (each dose infused over 4 hrs)  Vancomycin 1750mg  IV x 1 followed by 1250mg  IV q24h (goal AUC 400-500)  Follow SCr daily while on Vancomycin and Zosyn  Follow culture results/sensitivities  Height: 5\' 3"  (160 cm) Weight: 185 lb (83.9 kg) IBW/kg (Calculated) : 52.4  Temp (24hrs), Avg:98.4 F (36.9 C), Min:98.4 F (36.9 C), Max:98.4 F (36.9 C)  Recent Labs  Lab 03/17/18 1239 03/17/18 1247 03/17/18 1519  WBC 10.9*  --   --   CREATININE 0.75  --   --   LATICACIDVEN  --  1.16 1.43    Estimated Creatinine Clearance: 90.2 mL/min (by C-G formula based on SCr of 0.75 mg/dL).    Allergies  Allergen Reactions  . Dust Mite Extract Other (See Comments)    Allergy test 10 years ago    Antimicrobials this admission: 9/3 Clinda x 1  9/3 Vanc >>   9/3 Zosyn >>  Dose adjustments this admission:    Microbiology results: 9/3 BCx: sent  Thank you for allowing pharmacy to be a part of this patient's care.  Everette Rank, PharmD 03/17/2018 4:28 PM

## 2018-03-17 NOTE — H&P (Signed)
History and Physical    Caitlin Valdez LKG:[id number] DOB: 30-Dec-1970 DOA: 03/17/2018  PCP: [facility]   Patient coming from: Home  Chief Complaint: Right Arm Pain  HPI: Caitlin Valdez is a 47 y.o. female with medical history significant of anxiety, depression, fibromyalgia, history of tobacco abuse, obesity, history of migraines, history of peripheral vascular disease in the right leg, history of stroke, history of trichomonas, and other comorbidities presents to the hospital with a chief complaint of right arm pain and swelling associated with erythema and redness that significantly worsened the last 24 hours after an incision and drainage was done at [facility] urgent care.  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  She then again presented to [facility] urgent care on 03/14/2018 and she declined I&D at that time and opted for oral medications Bactrim.  She can return to urgent care 03/16/2018 and did not improve so was seen and evaluated and I&D was performed.  She is significantly worse in the last 24 hours and re-presented to urgent care who referred her to occupational health and then referred her to the emergency department.  Having significant pain and swelling and redness and erythema and was to the point of tears.  Denied chest pain lightheadedness dizziness for any other complaints or concerns.  [facility] was called to admit this patient for a left arm cellulitis and hand surgery was consulted for more extensive I&D.  ED Course: Basic blood work done along with 2 L boluses and started on maintenance IV fluids with normal saline rate of 125 mils per hour.  Was given IV clindamycin and hand surgery was consulted Dr. Grandville Silos  evaluated the patient in the ED and recommended expiration with drainage and irrigation at bedside with local anesthetic and this was done.  Dr. Grandville Silos evaluated and recommended IV antibiotics and transitioning to appropriate p.o. antibiotics and close supervision and signed off the case but remain available for questions regarding further surgical intervention.  Review of Systems: As per HPI otherwise 10 point review of systems negative.   Past Medical History:  Diagnosis Date  . Abdominal pain, right lower quadrant   . Anxiety   . Depression   . Fibromyalgia   . GERD (gastroesophageal reflux disease)   . Migraine   . Obesity   . Peripheral vascular disease (HCC)    RIGHT LEG  . PONV (postoperative nausea and vomiting)   . Stroke ([place])   . Trichomonas 2012   Past Surgical History:  Procedure Laterality Date  . CESAREAN SECTION    . FRACTURE SURGERY    . HYSTEROSCOPY WITH NOVASURE N/A 08/08/2014   Procedure:  NOVASURE;  Surgeon: Emily Filbert, MD;  Location: [facility];  Service: Gynecology;  Laterality: N/A;  . VEIN SURGERY     SOCIAL HISTORY  reports that she has been smoking cigarettes. She has a 5.00 pack-year smoking history. She has never used smokeless tobacco. She reports that she does not drink alcohol or use drugs.  Allergies  Allergen Reactions  . Dust Mite Extract Other (See Comments)    Allergy test 10 years ago   Family History  Problem Relation Age of Onset  .  Diabetes Mother   . Hypertension Mother   . Diabetes Father   . Hypertension Father   . Diabetes Other   . Hypertension Other   . Other Neg Hx    Prior to Admission medications   Medication Sig Start Date End Date Taking? Authorizing Provider  ibuprofen (ADVIL,MOTRIN) 200 MG tablet Take 600 mg by mouth every 6 (six) hours as needed for headache.   Yes [provider]  meloxicam (MOBIC) 7.5 MG tablet Take 1 tablet (7.5 mg total) by mouth daily. 03/09/18  Yes Yu, Amy V, PA-C  mupirocin ointment  (BACTROBAN) 2 % Apply 1 application topically 2 (two) times daily. 03/09/18  Yes Yu, Amy V, PA-C  sulfamethoxazole-trimethoprim (BACTRIM DS,SEPTRA DS) 800-160 MG tablet Take 1 tablet by mouth 2 (two) times daily for 10 days. 03/14/18 03/24/18 Yes Vanessa Kick, MD   Physical Exam: Vitals:   03/17/18 1221 03/17/18 1600  BP: 140/89   Pulse: 99 85  Resp: 17   Temp: 98.4 F (36.9 C)   TempSrc: Oral   SpO2: 100% 97%  Weight: 83.9 kg   Height: 5\' 3"  (1.6 m)    Constitutional: WN/WD, NAD and appears anxious and upset and uncomfortable Eyes: Lids and conjunctivae normal, sclerae anicteric  ENMT: External Ears, Nose appear normal. Grossly normal hearing Neck: Appears normal, supple, no cervical masses, normal ROM, no appreciable thyromegaly, no JVD Respiratory: Diminished to auscultation bilaterally, no wheezing, rales, rhonchi or crackles. Normal respiratory effort and patient is not tachypenic. No accessory muscle use.  Cardiovascular: RRR, no murmurs / rubs / gallops. S1 and S2 auscultated. Significant Left Arm Erythema and Warmth Abdomen: Soft, non-tender, Distended due to body habitus. No masses palpated. No appreciable hepatosplenomegaly. Bowel sounds positive x4.  GU: Deferred. Musculoskeletal: Left Arm severely swollen with erythema and warmth from the hand to the shoulder; No contractures or cyanosis  Skin: No rashes but Left arm is significantly erythematous and warm and swollen. Has a incision with packing that was just done by Hand Surgery. No induration; Warm and dry.  Neurologic: CN 2-12 grossly intact with no focal deficits. Romberg sign and cerebellar reflexes not assessed.  Psychiatric: Normal judgment and insight. Alert and oriented x 3. Tearful mood and anxious affect.   Labs on Admission: I have personally reviewed following labs and imaging studies  CBC: Recent Labs  Lab 03/17/18 1239  WBC 10.9*  NEUTROABS 6.3  HGB 13.4  HCT 39.5  MCV 96.8  PLT 716   Basic Metabolic  Panel: Recent Labs  Lab 03/17/18 1239  NA 142  K 3.6  CL 110  CO2 23  GLUCOSE 91  BUN 9  CREATININE 0.75  CALCIUM 8.8*   GFR: Estimated Creatinine Clearance: 90.2 mL/min (by C-G formula based on SCr of 0.75 mg/dL). Liver Function Tests: Recent Labs  Lab 03/17/18 1239  AST 12*  ALT 12  ALKPHOS 72  BILITOT 0.6  PROT 6.3*  ALBUMIN 3.2*   No results for input(s): LIPASE, AMYLASE in the last 168 hours. No results for input(s): AMMONIA in the last 168 hours. Coagulation Profile: No results for input(s): INR, PROTIME in the last 168 hours. Cardiac Enzymes: No results for input(s): CKTOTAL, CKMB, CKMBINDEX, TROPONINI in the last 168 hours. BNP (last 3 results) No results for input(s): PROBNP in the last 8760 hours. HbA1C: No results for input(s): HGBA1C in the last 72 hours. CBG: No results for input(s): GLUCAP in the last 168 hours. Lipid Profile: No results for input(s): CHOL, HDL,  LDLCALC, TRIG, CHOLHDL, LDLDIRECT in the last 72 hours. Thyroid Function Tests: No results for input(s): TSH, T4TOTAL, FREET4, T3FREE, THYROIDAB in the last 72 hours. Anemia Panel: No results for input(s): VITAMINB12, FOLATE, FERRITIN, TIBC, IRON, RETICCTPCT in the last 72 hours. Urine analysis:    Component Value Date/Time   COLORURINE YELLOW 02/25/2017 2058   APPEARANCEUR CLEAR 02/25/2017 2058   LABSPEC 1.018 02/25/2017 2058   PHURINE 5.0 02/25/2017 2058   GLUCOSEU NEGATIVE 02/25/2017 2058   HGBUR MODERATE (A) 02/25/2017 2058   BILIRUBINUR NEGATIVE 02/25/2017 2058   [place] NEGATIVE 02/25/2017 2058   PROTEINUR NEGATIVE 02/25/2017 2058   UROBILINOGEN 1.0 08/08/2014 2057   NITRITE NEGATIVE 02/25/2017 2058   LEUKOCYTESUR NEGATIVE 02/25/2017 2058   Sepsis Labs: !!!!!!!!!!!!!!!!!!!!!!!!!!!!!!!!!!!!!!!!!!!! @LABRCNTIP (procalcitonin:4,lacticidven:4) )No results found for this or any previous visit (from the past 240 hour(s)).   Radiological Exams on Admission: Dg Forearm  Left  Result Date: 03/17/2018 CLINICAL DATA:  Bitten by a spider with area of swelling there was lanced and drained yesterday, the patient has been on antibiotic treatment EXAM: LEFT FOREARM - 2 VIEW COMPARISON:  None. FINDINGS: No acute fracture is seen. Alignment is normal. No elbow joint effusion is noted. There is a focus of soft tissue swelling on the lateral view along the dorsal aspect near the elbow joint which may represent the area of spider bite. Some higher attenuation material is noted within this which may represent cleansing solution or overlapping bandage. Clinical correlation is recommended. No underlying bony abnormality is seen. IMPRESSION: Focus of soft tissue swelling along the dorsal aspect of the proximal left forearm as noted above probably at the site of spider bite. No underlying bony abnormality. Electronically Signed   By: Ivar Drape M.D.   On: 03/17/2018 13:52   Dg Humerus Left  Result Date: 03/17/2018 CLINICAL DATA:  Bitten by spider, recent [place] of wound with drainage, recent antibiotic treatment EXAM: LEFT HUMERUS - 2+ VIEW COMPARISON:  None. FINDINGS: Views of the left humerus show no abnormality. No significant soft tissue swelling is seen and no bony abnormality is noted. IMPRESSION: Negative. Electronically Signed   By: Ivar Drape M.D.   On: 03/17/2018 13:52   EKG: No EKG done on admission to the ED but will order one now.  Assessment/Plan Active Problems:   Fibromyalgia   Obesity   Current smoker   Cellulitis of arm   Anxiety and depression  Acute Worsening Forearm Cellulitis -Admit to Med-Surge -Failed Outpatient Treatment with po Abx -Hand Surgery consulted for worsening of Arm and Swelling of Hand and did a bedside I&D -Wound nurse consulted for further evaluation recommendations -X-Ray of Fore-Arm showed Focus of soft tissue swelling along the dorsal aspect of the proximal left forearm as noted above probably at the site of spider bite. No  underlying bony abnormality -Given 2 Liters of IV Fluid in the ED and will start Maintenance IVF with NS at 100 mL/hr x 1 day -IV clindamycin in the ED and will stop IV clindamycin and start the patient on broad-spectrum antibiotics with vancomycin and Zosyn -WBC was 10.9 and will continue to monitor -Actiq acid levels reassured daily normal at 1.16 and then repeat was 1.43 -Cultures x2 in the ED and will follow up on aerobic culture sent by incision and drainage  Intractable pain secondary to above complicated by fibromyalgia -Placed on acetaminophen, hydrocodone-acetaminophen and IV fentanyl for pain control -Continue to monitor and adjust as necessary -We will need outpatient pain management -Continue with meloxicam  10.5 mg p.o. daily for now  Tobacco abuse -Smoking cessation counseling given -Patient declined nicotine patch as she states that she just bought a pack of cigarettes that is in her car she intends to smoke them -Patient has no intentions of quitting smoking currently  Obesity -Estimated body mass index is 32.77 kg/m as calculated from the following:   Height as of this encounter: 5\' 3"  (1.6 m).   Weight as of this encounter: 83.9 kg. -Weight Loss Counseling Given   Depression and Anxiety -Not on any current medication but likely will need to be placed on some as an outpatient  History of CVA -Patient states this was years ago -Not on any current medications -Continue to monitor  DVT prophylaxis: Enoxaparin 40 mg subcu nightly Code Status: FULL CODE Family Communication: No family present at bedside  Disposition Plan: Anticipate D/C Home when medically stable Consults called: Hand Surgery Dr. Grandville Silos Admission status: Inpatient Med-Surge  Severity of Illness: The appropriate patient status for this patient is INPATIENT. Inpatient status is judged to be reasonable and necessary in order to provide the required intensity of service to ensure the patient's  safety. The patient's presenting symptoms, physical exam findings, and initial radiographic and laboratory data in the context of their chronic comorbidities is felt to place them at high risk for further clinical deterioration. Furthermore, it is not anticipated that the patient will be medically stable for discharge from the hospital within 2 midnights of admission. The following factors support the patient status of inpatient.   " The patient's presenting symptoms include pain swelling, and erythema. " The worrisome physical exam findings include painful palpation on left arm " The initial radiographic and laboratory data are worrisome because of leukocytosis and forearm swelling noted on x-ray. " The chronic co-morbidities include back abuse, obesity, fibromyalgia and depression and anxiety with history of CVA.   * I certify that at the point of admission it is my clinical judgment that the patient will require inpatient hospital care spanning beyond 2 midnights from the point of admission due to high intensity of service, high risk for further deterioration and high frequency of surveillance required.Kerney Elbe, D.O. [facility]ER is on [place]  If 7PM-7AM, please contact night-coverage [email or web address] Password [email or web address]  03/17/2018, 4:22 PM

## 2018-03-17 NOTE — ED Notes (Signed)
Pt ambulated to the bathroom with a steady gait.

## 2018-03-18 DIAGNOSIS — F329 Major depressive disorder, single episode, unspecified: Secondary | ICD-10-CM

## 2018-03-18 DIAGNOSIS — F419 Anxiety disorder, unspecified: Secondary | ICD-10-CM

## 2018-03-18 DIAGNOSIS — E669 Obesity, unspecified: Secondary | ICD-10-CM

## 2018-03-18 DIAGNOSIS — F17200 Nicotine dependence, unspecified, uncomplicated: Secondary | ICD-10-CM

## 2018-03-18 DIAGNOSIS — L03114 Cellulitis of left upper limb: Principal | ICD-10-CM

## 2018-03-18 DIAGNOSIS — Z6832 Body mass index (BMI) 32.0-32.9, adult: Secondary | ICD-10-CM

## 2018-03-18 LAB — CBC
HCT: 38 % (ref 36.0–46.0)
Hemoglobin: 12.7 g/dL (ref 12.0–15.0)
MCH: 32.4 pg (ref 26.0–34.0)
MCHC: 33.4 g/dL (ref 30.0–36.0)
MCV: 96.9 fL (ref 78.0–100.0)
Platelets: 296 10*3/uL (ref 150–400)
RBC: 3.92 MIL/uL (ref 3.87–5.11)
RDW: 12.7 % (ref 11.5–15.5)
WBC: 10.8 10*3/uL — ABNORMAL HIGH (ref 4.0–10.5)

## 2018-03-18 LAB — COMPREHENSIVE METABOLIC PANEL
ALT: 12 U/L (ref 0–44)
AST: 12 U/L — ABNORMAL LOW (ref 15–41)
Albumin: 3 g/dL — ABNORMAL LOW (ref 3.5–5.0)
Alkaline Phosphatase: 61 U/L (ref 38–126)
Anion gap: 6 (ref 5–15)
BUN: 8 mg/dL (ref 6–20)
CO2: 25 mmol/L (ref 22–32)
Calcium: 8.4 mg/dL — ABNORMAL LOW (ref 8.9–10.3)
Chloride: 110 mmol/L (ref 98–111)
Creatinine, Ser: 0.79 mg/dL (ref 0.44–1.00)
GFR calc Af Amer: >60 mL/min (ref >60)
GFR calc non Af Amer: >60 mL/min (ref >60)
Glucose, Bld: 90 mg/dL (ref 70–99)
Potassium: 3.6 mmol/L (ref 3.5–5.1)
Sodium: 141 mmol/L (ref 135–145)
Total Bilirubin: 0.5 mg/dL (ref 0.3–1.2)
Total Protein: 6 g/dL — ABNORMAL LOW (ref 6.5–8.1)

## 2018-03-18 LAB — HEMOGLOBIN A1C
Hgb A1c MFr Bld: 5.4 % (ref 4.8–5.6)
Mean Plasma Glucose: 108.28 mg/dL

## 2018-03-18 LAB — HIV ANTIBODY (ROUTINE TESTING W REFLEX): HIV Screen 4th Generation wRfx: NONREACTIVE

## 2018-03-18 LAB — GLUCOSE, CAPILLARY: Glucose-Capillary: 87 mg/dL (ref 70–99)

## 2018-03-18 LAB — PHOSPHORUS: Phosphorus: 3.5 mg/dL (ref 2.5–4.6)

## 2018-03-18 LAB — MAGNESIUM: Magnesium: 2 mg/dL (ref 1.7–2.4)

## 2018-03-18 NOTE — Progress Notes (Signed)
PT Note  Patient Details Name: Caitlin Valdez MRN: [id number] DOB: 1971/06/19   Cancelled Treatment:     PT order received but eval deferred.  Pt screened by OT and with no PT needs at this time.  Dc PT services at this time.[name]   Lailoni Baquera 03/18/2018, 12:48 PM

## 2018-03-18 NOTE — Progress Notes (Addendum)
PROGRESS NOTE    Caitlin Valdez   WUX:[id number]  DOB: [birth year]  DOA: 03/17/2018 PCP: [facility]   Brief Narrative:  Caitlin Valdez is a 47 y.o. female with medical history significant of anxiety, depression, fibromyalgia, history of tobacco abuse, obesity, history of migraines, history of peripheral vascular disease in the right leg, history of stroke, history of trichomonas, and other comorbidities presents to the hospital with a chief complaint of right arm pain and swelling associated with erythema and redness starting around 03/06/18.  It significantly worsened after an incision and drainage was done at [facility] urgent care on 9/2. She had failed to take antibiotics prescribed on 8/26 (due to cost) and later declined an I and D on 8/31.  Subjective: Continues to have pain in left arm. No other complaints.     Assessment & Plan:   Principal Problem:   Cellulitis abscess of left arm - s/p I and D in urgent care on 9/2 and again on 9/3 at bedside by Dr Grandville Silos - she did not take outpt antibiotics initially- later declined I and D and thus infection continued to progress - rare gr + cocci on wound culture- blood cultures NGTD - currently on Vanc & Zosyn-   Active Problems:    Current smoker - trying to quit- have counseled her- declining nicotine patch    Obesity -Body mass index is 34.37 kg/m.   DVT prophylaxis: Lovenox Code Status: Full code Family Communication: son  Disposition Plan: home in 1-2 days Consultants:   Hand surgery Procedures:   I and D of left arm Antimicrobials:  Anti-infectives (From admission, onward)   Start     Dose/Rate Route Frequency Ordered Stop   03/18/18 1800  vancomycin (VANCOCIN) 1,250 mg in sodium chloride 0.9 % 250 mL IVPB     1,250 mg 166.7 mL/hr over 90 Minutes Intravenous Every 24 hours 03/17/18 1758     03/17/18 2300  piperacillin-tazobactam (ZOSYN) IVPB 3.375 g     3.375 g 12.5 mL/hr over 240 Minutes  Intravenous Every 8 hours 03/17/18 1642     03/17/18 1745  piperacillin-tazobactam (ZOSYN) IVPB 3.375 g  Status:  Discontinued     3.375 g 100 mL/hr over 30 Minutes Intravenous  Once 03/17/18 1735 03/17/18 1737   03/17/18 1745  vancomycin (VANCOCIN) IVPB 1000 mg/200 mL premix  Status:  Discontinued     1,000 mg 200 mL/hr over 60 Minutes Intravenous  Once 03/17/18 1735 03/17/18 1736   03/17/18 1630  piperacillin-tazobactam (ZOSYN) IVPB 3.375 g     3.375 g 100 mL/hr over 30 Minutes Intravenous STAT 03/17/18 1615 03/17/18 1709   03/17/18 1630  vancomycin (VANCOCIN) 1,750 mg in sodium chloride 0.9 % 500 mL IVPB     1,750 mg 250 mL/hr over 120 Minutes Intravenous STAT 03/17/18 1616 03/17/18 1940   03/17/18 1430  clindamycin (CLEOCIN) IVPB 600 mg     600 mg 100 mL/hr over 30 Minutes Intravenous  Once 03/17/18 1421 03/17/18 1603       Objective: Vitals:   03/17/18 2025 03/18/18 0500 03/18/18 0521 03/18/18 1500  BP: 119/75  113/76 114/79  Pulse: 80  78 74  Resp: 18  16 15   Temp: 98.8 F (37.1 C)  98.3 F (36.8 C) 98.3 F (36.8 C)  TempSrc: Oral  Oral Oral  SpO2: 100%  98% 100%  Weight:  88 kg    Height:        Intake/Output Summary (Last 24  hours) at 03/18/2018 1539 Last data filed at 03/18/2018 1532 Gross per 24 hour  Intake 4071.52 ml  Output -  Net 4071.52 ml   Filed Weights   03/17/18 1221 03/17/18 1808 03/18/18 0500  Weight: 83.9 kg 84.9 kg 88 kg    Examination: General exam: Appears comfortable  HEENT: PERRLA, oral mucosa moist, no sclera icterus or thrush Respiratory system: Clear to auscultation. Respiratory effort normal. Cardiovascular system: S1 & S2 heard, RRR.   Gastrointestinal system: Abdomen soft, non-tender, nondistended. Normal bowel sound. No organomegaly Central nervous system: Alert and oriented. No focal neurological deficits. Extremities: No cyanosis, clubbing- significant amount of edema in left UE noted extending from shoulder to hand- dressing not  opened. Skin: see above Psychiatry:  Mood & affect appropriate.     Data Reviewed: I have personally reviewed following labs and imaging studies  CBC: Recent Labs  Lab 03/17/18 1239 03/18/18 0538  WBC 10.9* 10.8*  NEUTROABS 6.3  --   HGB 13.4 12.7  HCT 39.5 38.0  MCV 96.8 96.9  PLT 271 195   Basic Metabolic Panel: Recent Labs  Lab 03/17/18 1239 03/18/18 0538  NA 142 141  K 3.6 3.6  CL 110 110  CO2 23 25  GLUCOSE 91 90  BUN 9 8  CREATININE 0.75 0.79  CALCIUM 8.8* 8.4*  MG  --  2.0  PHOS  --  3.5   GFR: Estimated Creatinine Clearance: 92.4 mL/min (by C-G formula based on SCr of 0.79 mg/dL). Liver Function Tests: Recent Labs  Lab 03/17/18 1239 03/18/18 0538  AST 12* 12*  ALT 12 12  ALKPHOS 72 61  BILITOT 0.6 0.5  PROT 6.3* 6.0*  ALBUMIN 3.2* 3.0*   No results for input(s): LIPASE, AMYLASE in the last 168 hours. No results for input(s): AMMONIA in the last 168 hours. Coagulation Profile: No results for input(s): INR, PROTIME in the last 168 hours. Cardiac Enzymes: No results for input(s): CKTOTAL, CKMB, CKMBINDEX, TROPONINI in the last 168 hours. BNP (last 3 results) No results for input(s): PROBNP in the last 8760 hours. HbA1C: Recent Labs    03/18/18 0538  HGBA1C 5.4   CBG: Recent Labs  Lab 03/18/18 0723  GLUCAP 87   Lipid Profile: No results for input(s): CHOL, HDL, LDLCALC, TRIG, CHOLHDL, LDLDIRECT in the last 72 hours. Thyroid Function Tests: Recent Labs    03/17/18 1837  TSH 3.297   Anemia Panel: No results for input(s): VITAMINB12, FOLATE, FERRITIN, TIBC, IRON, RETICCTPCT in the last 72 hours. Urine analysis:    Component Value Date/Time   COLORURINE YELLOW 02/25/2017 2058   APPEARANCEUR CLEAR 02/25/2017 2058   LABSPEC 1.018 02/25/2017 2058   PHURINE 5.0 02/25/2017 2058   GLUCOSEU NEGATIVE 02/25/2017 2058   HGBUR MODERATE (A) 02/25/2017 2058   BILIRUBINUR NEGATIVE 02/25/2017 2058   [employer] NEGATIVE 02/25/2017 2058    PROTEINUR NEGATIVE 02/25/2017 2058   UROBILINOGEN 1.0 08/08/2014 2057   NITRITE NEGATIVE 02/25/2017 2058   LEUKOCYTESUR NEGATIVE 02/25/2017 2058   Sepsis Labs: @LABRCNTIP (procalcitonin:4,lacticidven:4) ) Recent Results (from the past 240 hour(s))  Blood culture (routine x 2)     Status: None (Preliminary result)   Collection Time: 03/17/18 12:40 PM  Result Value Ref Range Status   Specimen Description   Final    BLOOD RIGHT ANTECUBITAL Performed at [facility], [place] [address], [place], [place] [address]    Special Requests   Final    BOTTLES DRAWN AEROBIC AND ANAEROBIC Blood Culture results may not  be optimal due to an inadequate volume of blood received in culture bottles Performed at [place] [address] [place], [place] [address]    Culture   Final    NO GROWTH < 24 HOURS Performed at [place] [address], [place], [place] [address]    Report Status PENDING  Incomplete  Blood culture (routine x 2)     Status: None (Preliminary result)   Collection Time: 03/17/18  2:52 PM  Result Value Ref Range Status   Specimen Description   Final    BLOOD RIGHT FOREARM Performed at [place] [address] [place], [place] [address]    Special Requests   Final    BOTTLES DRAWN AEROBIC AND ANAEROBIC Blood Culture adequate volume Performed at [facility] [address] [place], [place] [address]    Culture   Final    NO GROWTH < 24 HOURS Performed at [place] [address], [place], [place] [address]    Report Status PENDING  Incomplete  Aerobic Culture (superficial specimen)     Status: None (Preliminary result)   Collection Time: 03/17/18  3:24 PM  Result Value Ref Range Status   Specimen Description   Final    ABSCESS LEFT ELBOW Performed at [facility] [address], [place], [place] [address]    Special Requests   Final     NONE Performed at [facility], [place] [address] [place], [place] [address]    Gram Stain   Final    MODERATE WBC PRESENT, PREDOMINANTLY PMN RARE GRAM POSITIVE COCCI    Culture   Final    CULTURE REINCUBATED FOR BETTER GROWTH Performed at [facility], [place] [address], [place], [place] [address]    Report Status PENDING  Incomplete         Radiology Studies: Dg Forearm Left  Result Date: 03/17/2018 CLINICAL DATA:  Bitten by a spider with area of swelling there was lanced and drained yesterday, the patient has been on antibiotic treatment EXAM: LEFT FOREARM - 2 VIEW COMPARISON:  None. FINDINGS: No acute fracture is seen. Alignment is normal. No elbow joint effusion is noted. There is a focus of soft tissue swelling on the lateral view along the dorsal aspect near the elbow joint which may represent the area of spider bite. Some higher attenuation material is noted within this which may represent cleansing solution or overlapping bandage. Clinical correlation is recommended. No underlying bony abnormality is seen. IMPRESSION: Focus of soft tissue swelling along the dorsal aspect of the proximal left forearm as noted above probably at the site of spider bite. No underlying bony abnormality. Electronically Signed   By: Ivar Drape M.D.   On: 03/17/2018 13:52   Dg Humerus Left  Result Date: 03/17/2018 CLINICAL DATA:  Bitten by spider, recent [place] of wound with drainage, recent antibiotic treatment EXAM: LEFT HUMERUS - 2+ VIEW COMPARISON:  None. FINDINGS: Views of the left humerus show no abnormality. No significant soft tissue swelling is seen and no bony abnormality is noted. IMPRESSION: Negative. Electronically Signed   By: Ivar Drape M.D.   On: 03/17/2018 13:52      Scheduled Meds: . bacitracin   Topical BID  . docusate sodium  100 mg Oral BID  . enoxaparin (LOVENOX) injection  40 mg Subcutaneous Q24H  . mupirocin ointment  1 application Topical BID    Continuous Infusions: .  sodium chloride 125 mL/hr at 03/18/18 1532  . piperacillin-tazobactam (ZOSYN)  IV Stopped (03/18/18 1041)  . vancomycin       LOS: 1 day    Time spent in minutes: 35    Debbe Odea, MD [facility] Pager: [email or web address] Password [facility] 03/18/2018, 3:39 PM

## 2018-03-18 NOTE — Evaluation (Signed)
Occupational Therapy Evaluation Patient Details Name: Caitlin Valdez MRN: [id number] DOB: 1971/05/05 Today's Date: 03/18/2018    History of Present Illness 47 y.o. female who presents with LUE pain and swelling. She was initially evaluated at [facility] urgent care on 03-09-18, where she reported having sustained some type of insect bite at work.  Now s/p bedside I&D on 03/17/18.   Clinical Impression   Pt admitted with the above diagnoses and presents with below problem list. Pt will benefit from continued acute OT to address the below listed deficits and maximize independence with BADLs prior to d/c home. PTA pt was independent with ADLs and IADLs. Pt presents with LUE edema impacting ROM and functional use of LUE. Began OT education on strategies for ADLs and edema control/management.      Follow Up Recommendations  No OT follow up    Equipment Recommendations  None recommended by OT    Recommendations for Other Services       Precautions / Restrictions Restrictions Weight Bearing Restrictions: No Other Position/Activity Restrictions: LUE edema control      Mobility Bed Mobility Overal bed mobility: Modified Independent                Transfers Overall transfer level: Needs assistance Equipment used: None Transfers: Sit to/from Stand Sit to Stand: Supervision              Balance Overall balance assessment: No apparent balance deficits (not formally assessed)                                         ADL either performed or assessed with clinical judgement   ADL Overall ADL's : Needs assistance/impaired Eating/Feeding: Set up;Sitting   Grooming: Minimal assistance;Standing;Set up   Upper Body Bathing: Min guard   Lower Body Bathing: Sit to/from stand;Supervison/ safety   Upper Body Dressing : Set up;Sitting   Lower Body Dressing: Supervision/safety;Sit to/from stand   Toilet Transfer: Supervision/safety;Ambulation   Toileting-  Clothing Manipulation and Hygiene: Supervision/safety;Sit to/from stand   Tub/ Shower Transfer: Tub transfer;Supervision/safety;Ambulation   Functional mobility during ADLs: Supervision/safety General ADL Comments: Pt completed in-room functional mobility. Discussed ADL and edema control strategies     Vision         Perception     Praxis      Pertinent Vitals/Pain Pain Assessment: Faces Faces Pain Scale: Hurts even more Pain Location: headache, LUE Pain Descriptors / Indicators: Aching;Sore Pain Intervention(s): Limited activity within patient's tolerance;Monitored during session;Repositioned     Hand Dominance     Extremity/Trunk Assessment Upper Extremity Assessment Upper Extremity Assessment: LUE deficits/detail LUE Deficits / Details: able to wiggle digits and complete AROM at shoulder level. Unable to complete composite flexion and wrist ROM. Limited ROM at elbow level. Edema noted begining on dorsum of digits and continuing through LUE. Some swelling noted by pt under L shoulder (armpit) as well.  LUE Coordination: decreased fine motor;decreased gross motor   Lower Extremity Assessment Lower Extremity Assessment: Overall WFL for tasks assessed   Cervical / Trunk Assessment Cervical / Trunk Assessment: Normal   Communication Communication Communication: No difficulties   Cognition Arousal/Alertness: Awake/alert Behavior During Therapy: WFL for tasks assessed/performed Overall Cognitive Status: Within Functional Limits for tasks assessed  General Comments       Exercises     Shoulder Instructions      Home Living Family/patient expects to be discharged to:: Private residence Living Arrangements: Alone   Type of Home: House Home Access: Level entry     Home Layout: One level     Bathroom Shower/Tub: Tub/shower unit         Home Equipment: None          Prior Functioning/Environment Level  of Independence: Independent                 OT Problem List: Decreased range of motion;Impaired balance (sitting and/or standing);Decreased knowledge of precautions;Decreased knowledge of use of DME or AE;Impaired UE functional use;Pain;Increased edema      OT Treatment/Interventions: Self-care/ADL training;Therapeutic exercise;DME and/or AE instruction;Therapeutic activities;Patient/family education;Balance training    OT Goals(Current goals can be found in the care plan section) Acute Rehab OT Goals Patient Stated Goal: never see another spider again. Eager for LUE edema to decrease and regain functional use of LUE. OT Goal Formulation: With patient Time For Goal Achievement: 03/25/18 Potential to Achieve Goals: Good ADL Goals Pt Will Perform Grooming: with modified independence;sitting;standing Additional ADL Goal #1: Pt will be independent with strategies for reducing LUE edema.  OT Frequency: Min 2X/week   Barriers to D/C:            Co-evaluation              AM-PAC PT "6 Clicks" Daily Activity     Outcome Measure Help from another person eating meals?: None Help from another person taking care of personal grooming?: A Little Help from another person toileting, which includes using toliet, bedpan, or urinal?: None Help from another person bathing (including washing, rinsing, drying)?: None Help from another person to put on and taking off regular upper body clothing?: None Help from another person to put on and taking off regular lower body clothing?: None 6 Click Score: 23   End of Session    Activity Tolerance: Patient tolerated treatment well Patient left: in bed;with call bell/phone within reach  OT Visit Diagnosis: Pain Pain - Right/Left: Left Pain - part of body: Arm                Time: 1054-1106 OT Time Calculation (min): 12 min Charges:  OT General Charges $OT Visit: 1 Visit OT Evaluation $OT Eval Low Complexity: 1 Low    Hortencia Pilar 03/18/2018, 11:26 AM

## 2018-03-19 DIAGNOSIS — L0291 Cutaneous abscess, unspecified: Secondary | ICD-10-CM

## 2018-03-19 LAB — CREATININE, SERUM
Creatinine, Ser: 0.81 mg/dL (ref 0.44–1.00)
GFR calc Af Amer: >60 mL/min (ref >60)
GFR calc non Af Amer: >60 mL/min (ref >60)

## 2018-03-19 LAB — GLUCOSE, CAPILLARY: Glucose-Capillary: 69 mg/dL — ABNORMAL LOW (ref 70–99)

## 2018-03-19 MED ORDER — POLYETHYLENE GLYCOL 3350 17 G PO PACK
17.0000 g | PACK | Freq: Two times a day (BID) | ORAL | Status: DC | PRN
  Administered 2018-03-19: 17 g via ORAL
  Filled 2018-03-19: qty 1

## 2018-03-19 MED ORDER — BISACODYL 10 MG RE SUPP
10.0000 mg | Freq: Every day | RECTAL | Status: DC | PRN
  Administered 2018-03-19: 10 mg via RECTAL
  Filled 2018-03-19: qty 1

## 2018-03-19 MED ORDER — CLOTRIMAZOLE 2 % VA CREA
1.0000 | TOPICAL_CREAM | Freq: Every day | VAGINAL | Status: DC
  Filled 2018-03-19: qty 21

## 2018-03-19 MED ORDER — CLOTRIMAZOLE 1 % VA CREA
1.0000 | TOPICAL_CREAM | Freq: Every day | VAGINAL | Status: DC
  Administered 2018-03-19: 1 via VAGINAL
  Filled 2018-03-19: qty 45

## 2018-03-19 MED ORDER — SODIUM CHLORIDE 0.9 % IV SOLN
1000.0000 mL | INTRAVENOUS | Status: DC
  Administered 2018-03-19 – 2018-03-21 (×3): 1000 mL via INTRAVENOUS

## 2018-03-19 NOTE — Progress Notes (Signed)
PROGRESS NOTE    [name]   CWC:[id number]  DOB: 12-05-1970  DOA: 03/17/2018 PCP: Care, [facility]   Brief Narrative:  Caitlin Valdez is a 47 y.o. female with medical history significant of anxiety, depression, fibromyalgia, history of tobacco abuse, obesity, history of migraines, history of peripheral vascular disease in the right leg, history of stroke, history of trichomonas, and other comorbidities presents to the hospital with a chief complaint of right arm pain and swelling associated with erythema and redness starting around 03/06/18.  It significantly worsened after an incision and drainage was done at [facility] urgent care on 9/2. She had failed to take antibiotics prescribed on 8/26 (due to cost) and later declined an I and D on 8/31.  Subjective: Still having some pain in left arm. No new complaints.     Assessment & Plan:   Principal Problem:   Cellulitis abscess of left arm - s/p I and D in urgent care on 9/2 and again on 9/3 at bedside by Dr Grandville Silos - she did not take outpt antibiotics initially- later declined I and D and thus infection continued to progress - rare gr + cocci on gram stain (wound culture)- final results pending- blood cultures NGTD - currently on Vanc & Zosyn-  - edema in upper arm has improved- still quite a bit of drainage from wound and significant edema in hand and forearm- needs at least another day of IV antibiotics   Active Problems:    Current smoker - trying to quit- have counseled her- declining nicotine patch    Obesity -Body mass index is 35.15 kg/m.   DVT prophylaxis: Lovenox Code Status: Full code Family Communication: son  Disposition Plan: home in 1-2 days Consultants:   Hand surgery Procedures:   I and D of left arm Antimicrobials:  Anti-infectives (From admission, onward)   Start     Dose/Rate Route Frequency Ordered Stop   03/18/18 1800  vancomycin (VANCOCIN) 1,250 mg in sodium chloride 0.9 % 250 mL  IVPB     1,250 mg 166.7 mL/hr over 90 Minutes Intravenous Every 24 hours 03/17/18 1758     03/17/18 2300  piperacillin-tazobactam (ZOSYN) IVPB 3.375 g     3.375 g 12.5 mL/hr over 240 Minutes Intravenous Every 8 hours 03/17/18 1642     03/17/18 1745  piperacillin-tazobactam (ZOSYN) IVPB 3.375 g  Status:  Discontinued     3.375 g 100 mL/hr over 30 Minutes Intravenous  Once 03/17/18 1735 03/17/18 1737   03/17/18 1745  vancomycin (VANCOCIN) IVPB 1000 mg/200 mL premix  Status:  Discontinued     1,000 mg 200 mL/hr over 60 Minutes Intravenous  Once 03/17/18 1735 03/17/18 1736   03/17/18 1630  piperacillin-tazobactam (ZOSYN) IVPB 3.375 g     3.375 g 100 mL/hr over 30 Minutes Intravenous STAT 03/17/18 1615 03/17/18 1709   03/17/18 1630  vancomycin (VANCOCIN) 1,750 mg in sodium chloride 0.9 % 500 mL IVPB     1,750 mg 250 mL/hr over 120 Minutes Intravenous STAT 03/17/18 1616 03/17/18 1940   03/17/18 1430  clindamycin (CLEOCIN) IVPB 600 mg     600 mg 100 mL/hr over 30 Minutes Intravenous  Once 03/17/18 1421 03/17/18 1603       Objective: Vitals:   03/18/18 1500 03/18/18 2114 03/19/18 0500 03/19/18 0523  BP: 114/79 120/73  (!) 131/93  Pulse: 74 82  75  Resp: 15 16  17   Temp: 98.3 F (36.8 C) 98.6 F (37 C)  97.9 F (36.6 C)  TempSrc: Oral Oral  Oral  SpO2: 100% 97%  100%  Weight:   90 kg   Height:        Intake/Output Summary (Last 24 hours) at 03/19/2018 1141 Last data filed at 03/19/2018 1100 Gross per 24 hour  Intake 3622.32 ml  Output -  Net 3622.32 ml   Filed Weights   03/17/18 1808 03/18/18 0500 03/19/18 0500  Weight: 84.9 kg 88 kg 90 kg    Examination: General exam: Appears comfortable  HEENT: PERRLA, oral mucosa moist, no sclera icterus or thrush Respiratory system: Clear to auscultation. Respiratory effort normal. Cardiovascular system: S1 & S2 heard, RRR.   Gastrointestinal system: Abdomen soft, non-tender, nondistended. Normal bowel sound. No organomegaly Central  nervous system: Alert and oriented. No focal neurological deficits. Extremities: No cyanosis, clubbing- significant amount of edema in left UE noted extending from elbow to hand- wound is draining bloody yellow fluid Skin: see above Psychiatry:  Mood & affect appropriate.     Data Reviewed: I have personally reviewed following labs and imaging studies  CBC: Recent Labs  Lab 03/17/18 1239 03/18/18 0538  WBC 10.9* 10.8*  NEUTROABS 6.3  --   HGB 13.4 12.7  HCT 39.5 38.0  MCV 96.8 96.9  PLT 271 035   Basic Metabolic Panel: Recent Labs  Lab 03/17/18 1239 03/18/18 0538 03/19/18 0542  NA 142 141  --   K 3.6 3.6  --   CL 110 110  --   CO2 23 25  --   GLUCOSE 91 90  --   BUN 9 8  --   CREATININE 0.75 0.79 0.81  CALCIUM 8.8* 8.4*  --   MG  --  2.0  --   PHOS  --  3.5  --    GFR: Estimated Creatinine Clearance: 92.3 mL/min (by C-G formula based on SCr of 0.81 mg/dL). Liver Function Tests: Recent Labs  Lab 03/17/18 1239 03/18/18 0538  AST 12* 12*  ALT 12 12  ALKPHOS 72 61  BILITOT 0.6 0.5  PROT 6.3* 6.0*  ALBUMIN 3.2* 3.0*   No results for input(s): LIPASE, AMYLASE in the last 168 hours. No results for input(s): AMMONIA in the last 168 hours. Coagulation Profile: No results for input(s): INR, PROTIME in the last 168 hours. Cardiac Enzymes: No results for input(s): CKTOTAL, CKMB, CKMBINDEX, TROPONINI in the last 168 hours. BNP (last 3 results) No results for input(s): PROBNP in the last 8760 hours. HbA1C: Recent Labs    03/18/18 0538  HGBA1C 5.4   CBG: Recent Labs  Lab 03/18/18 0723 03/19/18 0744  GLUCAP 87 69*   Lipid Profile: No results for input(s): CHOL, HDL, LDLCALC, TRIG, CHOLHDL, LDLDIRECT in the last 72 hours. Thyroid Function Tests: Recent Labs    03/17/18 1837  TSH 3.297   Anemia Panel: No results for input(s): VITAMINB12, FOLATE, FERRITIN, TIBC, IRON, RETICCTPCT in the last 72 hours. Urine analysis:    Component Value Date/Time    COLORURINE YELLOW 02/25/2017 2058   APPEARANCEUR CLEAR 02/25/2017 2058   LABSPEC 1.018 02/25/2017 2058   PHURINE 5.0 02/25/2017 2058   GLUCOSEU NEGATIVE 02/25/2017 2058   HGBUR MODERATE (A) 02/25/2017 2058   BILIRUBINUR NEGATIVE 02/25/2017 2058   [place] NEGATIVE 02/25/2017 2058   PROTEINUR NEGATIVE 02/25/2017 2058   UROBILINOGEN 1.0 08/08/2014 2057   NITRITE NEGATIVE 02/25/2017 2058   LEUKOCYTESUR NEGATIVE 02/25/2017 2058   Sepsis Labs: @LABRCNTIP (procalcitonin:4,lacticidven:4) ) Recent Results (from the past 240 hour(s))  Blood  culture (routine x 2)     Status: None (Preliminary result)   Collection Time: 03/17/18 12:40 PM  Result Value Ref Range Status   Specimen Description   Final    BLOOD RIGHT ANTECUBITAL Performed at [facility] [address], [place], [place] [address]    Special Requests   Final    BOTTLES DRAWN AEROBIC AND ANAEROBIC Blood Culture results may not be optimal due to an inadequate volume of blood received in culture bottles Performed at [facility] [address] [place], [place] [address]    Culture   Final    NO GROWTH 2 DAYS Performed at [facility] [address], [place], [place] [address]    Report Status PENDING  Incomplete  Blood culture (routine x 2)     Status: None (Preliminary result)   Collection Time: 03/17/18  2:52 PM  Result Value Ref Range Status   Specimen Description   Final    BLOOD RIGHT FOREARM Performed at [facility] [address], [place], [place] [address]    Special Requests   Final    BOTTLES DRAWN AEROBIC AND ANAEROBIC Blood Culture adequate volume Performed at [facility] [address], [place], [place] [address]    Culture   Final    NO GROWTH 2 DAYS Performed at [facility] [address], [place], [place] [address]    Report Status PENDING  Incomplete  Aerobic Culture (superficial specimen)     Status: None  (Preliminary result)   Collection Time: 03/17/18  3:24 PM  Result Value Ref Range Status   Specimen Description   Final    ABSCESS LEFT ELBOW Performed at [facility] [address] [place], [place] [address]    Special Requests   Final    NONE Performed at [facility], [place] [address], [place], [place] [address]    Gram Stain   Final    MODERATE WBC PRESENT, PREDOMINANTLY PMN RARE GRAM POSITIVE COCCI    Culture   Final    CULTURE REINCUBATED FOR BETTER GROWTH Performed at [facility], [place] [address] [place], [place] [address]    Report Status PENDING  Incomplete         Radiology Studies: Dg Forearm Left  Result Date: 03/17/2018 CLINICAL DATA:  Bitten by a spider with area of swelling there was lanced and drained yesterday, the patient has been on antibiotic treatment EXAM: LEFT FOREARM - 2 VIEW COMPARISON:  None. FINDINGS: No acute fracture is seen. Alignment is normal. No elbow joint effusion is noted. There is a focus of soft tissue swelling on the lateral view along the dorsal aspect near the elbow joint which may represent the area of spider bite. Some higher attenuation material is noted within this which may represent cleansing solution or overlapping bandage. Clinical correlation is recommended. No underlying bony abnormality is seen. IMPRESSION: Focus of soft tissue swelling along the dorsal aspect of the proximal left forearm as noted above probably at the site of spider bite. No underlying bony abnormality. Electronically Signed   By: Ivar Drape M.D.   On: 03/17/2018 13:52   Dg Humerus Left  Result Date: 03/17/2018 CLINICAL DATA:  Bitten by spider, recent [place] of wound with drainage, recent antibiotic treatment EXAM: LEFT HUMERUS - 2+ VIEW COMPARISON:  None. FINDINGS: Views of the left humerus show no abnormality. No significant soft tissue swelling is  seen and no bony abnormality is noted. IMPRESSION: Negative.  Electronically Signed   By: Ivar Drape M.D.   On: 03/17/2018 13:52      Scheduled Meds: . bacitracin   Topical BID  . docusate sodium  100 mg Oral BID  . enoxaparin (LOVENOX) injection  40 mg Subcutaneous Q24H  . mupirocin ointment  1 application Topical BID   Continuous Infusions: . sodium chloride Stopped (03/19/18 0657)  . piperacillin-tazobactam (ZOSYN)  IV 12.5 mL/hr at 03/19/18 1000  . vancomycin Stopped (03/18/18 1943)     LOS: 2 days    Time spent in minutes: 35    Debbe Odea, MD [facility] Pager: [email or web address] Password [email or web address] 03/19/2018, 11:41 AM

## 2018-03-20 LAB — CREATININE, SERUM
Creatinine, Ser: 0.81 mg/dL (ref 0.44–1.00)
GFR calc Af Amer: >60 mL/min (ref >60)
GFR calc non Af Amer: >60 mL/min (ref >60)

## 2018-03-20 LAB — AEROBIC CULTURE W GRAM STAIN (SUPERFICIAL SPECIMEN)

## 2018-03-20 LAB — GLUCOSE, CAPILLARY: Glucose-Capillary: 81 mg/dL (ref 70–99)

## 2018-03-20 LAB — AEROBIC CULTURE  (SUPERFICIAL SPECIMEN)

## 2018-03-20 LAB — MRSA PCR SCREENING: MRSA by PCR: NEGATIVE

## 2018-03-20 MED ORDER — OXYCODONE HCL 5 MG PO TABS
10.0000 mg | ORAL_TABLET | Freq: Once | ORAL | Status: AC
  Administered 2018-03-21: 10 mg via ORAL
  Filled 2018-03-20: qty 2

## 2018-03-20 MED ORDER — FLUCONAZOLE 100 MG PO TABS
100.0000 mg | ORAL_TABLET | Freq: Every day | ORAL | Status: DC
  Administered 2018-03-20 – 2018-03-21 (×2): 100 mg via ORAL
  Filled 2018-03-20 (×2): qty 1

## 2018-03-20 NOTE — Care Management (Addendum)
This CM was alerted by MD that [facility] would be ordered to check on pt wound at home. This CM contacted pt workers Production assistant, radio [employer] [phone]) about need for [facility]. Per Micheal they would not be paying for [facility] as pt workers comp claim has been denied. Pt was made aware. This CM inquired with [facility] if they would be able to provide [facility] through charity program and was informed that they would not be able to. Pt and RN both state that she has been doing a good job changing her own dressings.    Addendum:  After another conversation with workers Production assistant, radio, Pt claim has not been denied, it is currently pending. Claim # [id number]. Adjuster directed to contact medical records for pt information for claim.  Marney Doctor RN,BSN [phone]

## 2018-03-20 NOTE — Progress Notes (Signed)
Pharmacy Antibiotic Note  Caitlin Valdez is a 47 y.o. female admitted on 03/17/2018 with cellulitis.  Patient with recent bite of unknown insect/spider and recently started oral antibiotics, did not improve and underwent a limited I&D on 9/2.  Returned to ED with worsening of infection.  Pharmacy was consulted for Vancomycin and Zosyn dosing.  03/20/2018:  D4 Vanc/Zosyn  Renal function stable  WBC slightly increased, but stable at 10.8  Wound cx +MRSA (but superficial cx)  Plan:  Continue Zosyn 3.375gm IV q8h (each dose infused over 4 hrs)  Vancomycin 1250mg  IV q24h (goal AUC 400-500)  Follow SCr daily while on Vancomycin and Zosyn  Plan to de-escalate to oral antibiotics tomorrow per Dr Wynelle Cleveland so will defer Vancomycin levels for now.   Height: 5\' 3"  (160 cm) Weight: 189 lb 6.4 oz (85.9 kg) IBW/kg (Calculated) : 52.4  Temp (24hrs), Avg:98.3 F (36.8 C), Min:98 F (36.7 C), Max:98.5 F (36.9 C)  Recent Labs  Lab 03/17/18 1239 03/17/18 1247 03/17/18 1519 03/18/18 0538 03/19/18 0542 03/20/18 0504  WBC 10.9*  --   --  10.8*  --   --   CREATININE 0.75  --   --  0.79 0.81 0.81  LATICACIDVEN  --  1.16 1.43  --   --   --     Estimated Creatinine Clearance: 90.1 mL/min (by C-G formula based on SCr of 0.81 mg/dL).    Allergies  Allergen Reactions  . Dust Mite Extract Other (See Comments)    Allergy test 10 years ago    Antimicrobials this admission: 9/3 Clinda x 1  9/3 Vanc >>   9/3 Zosyn >> 9/6 Diflucan  Dose adjustments this admission:   Microbiology results: 9/3 BCx: NGTD 9/3 abscess (superficial): few MRSA  Thank you for allowing pharmacy to be a part of this patient's care.  Netta Cedars, PharmD, BCPS Pager: [phone] 03/20/2018 12:56 PM

## 2018-03-20 NOTE — Progress Notes (Addendum)
PROGRESS NOTE    [name]   WPY:[id number]  DOB: June 17, 1971  DOA: 03/17/2018 PCP: Care, [facility]   Brief Narrative:  Caitlin Valdez is a 47 y.o. female with medical history significant of anxiety, depression, fibromyalgia, history of tobacco abuse, obesity, history of migraines, history of peripheral vascular disease in the right leg, history of stroke, history of trichomonas, and other comorbidities presents to the hospital with a chief complaint of right arm pain and swelling associated with erythema and redness starting around 03/06/18.  It significantly worsened after an incision and drainage was done at [facility] urgent care on 9/2. She had failed to take antibiotics prescribed on 8/26 (due to cost) and later declined an I and D on 8/31.  Subjective: Feeling nauseated today after eating breakfast. Constipation resolved. Pain in left arm is easing.     Assessment & Plan:   Principal Problem:   Cellulitis abscess of left arm - s/p I and D in urgent care on 9/2 and again on 9/3 at bedside by Dr Grandville Silos - she did not take outpt antibiotics initially- later declined I and D and thus infection continued to progress - rare gr + cocci on gram stain (wound culture)- culture reveals MRSA- blood cultures NGTD - currently on Vanc & Zosyn- will d/c Zosyn - edema in arm is improving but still significant in forearm - wound continues to drain bloody fluid- still needs at least another day of IV antibiotics   Active Problems:  Nausea - PRN Zofran ordered- she thinks it was what she ate for breakfast - cont slow IVF as she is nauseated today    Current smoker - trying to quit- have counseled her- declining nicotine patch    Obesity -Body mass index is 33.55 kg/m.   DVT prophylaxis: Lovenox Code Status: Full code Family Communication: son  Disposition Plan: home likely tomorrow Consultants:   Hand surgery Procedures:   I and D of left arm Antimicrobials:    Anti-infectives (From admission, onward)   Start     Dose/Rate Route Frequency Ordered Stop   03/20/18 1000  fluconazole (DIFLUCAN) tablet 100 mg     100 mg Oral Daily 03/20/18 0756     03/18/18 1800  vancomycin (VANCOCIN) 1,250 mg in sodium chloride 0.9 % 250 mL IVPB     1,250 mg 166.7 mL/hr over 90 Minutes Intravenous Every 24 hours 03/17/18 1758     03/17/18 2300  piperacillin-tazobactam (ZOSYN) IVPB 3.375 g     3.375 g 12.5 mL/hr over 240 Minutes Intravenous Every 8 hours 03/17/18 1642     03/17/18 1745  piperacillin-tazobactam (ZOSYN) IVPB 3.375 g  Status:  Discontinued     3.375 g 100 mL/hr over 30 Minutes Intravenous  Once 03/17/18 1735 03/17/18 1737   03/17/18 1745  vancomycin (VANCOCIN) IVPB 1000 mg/200 mL premix  Status:  Discontinued     1,000 mg 200 mL/hr over 60 Minutes Intravenous  Once 03/17/18 1735 03/17/18 1736   03/17/18 1630  piperacillin-tazobactam (ZOSYN) IVPB 3.375 g     3.375 g 100 mL/hr over 30 Minutes Intravenous STAT 03/17/18 1615 03/17/18 1709   03/17/18 1630  vancomycin (VANCOCIN) 1,750 mg in sodium chloride 0.9 % 500 mL IVPB     1,750 mg 250 mL/hr over 120 Minutes Intravenous STAT 03/17/18 1616 03/17/18 1940   03/17/18 1430  clindamycin (CLEOCIN) IVPB 600 mg     600 mg 100 mL/hr over 30 Minutes Intravenous  Once 03/17/18 1421  03/17/18 1603       Objective: Vitals:   03/19/18 2041 03/20/18 0620 03/20/18 0627 03/20/18 1337  BP: 135/89 122/85  (!) 129/100  Pulse: 63 68  66  Resp:  15  18  Temp: 98 F (36.7 C) 98.4 F (36.9 C)  98.4 F (36.9 C)  TempSrc: Oral Oral  Oral  SpO2: 100% 100%  98%  Weight:   85.9 kg   Height:        Intake/Output Summary (Last 24 hours) at 03/20/2018 1343 Last data filed at 03/20/2018 1338 Gross per 24 hour  Intake 3391.2 ml  Output -  Net 3391.2 ml   Filed Weights   03/18/18 0500 03/19/18 0500 03/20/18 0627  Weight: 88 kg 90 kg 85.9 kg    Examination: General exam: Appears comfortable  HEENT: PERRLA, oral  mucosa moist, no sclera icterus or thrush Respiratory system: Clear to auscultation. Respiratory effort normal. Cardiovascular system: S1 & S2 heard, RRR.   Gastrointestinal system: Abdomen soft, non-tender, nondistended. Normal bowel sound. No organomegaly Central nervous system: Alert and oriented. No focal neurological deficits. Extremities: No cyanosis, clubbing-  wound is draining bloody yellow fluid- left forearm and hand still swollen and painful to touch Skin: see above Psychiatry:  Mood & affect appropriate.     Data Reviewed: I have personally reviewed following labs and imaging studies  CBC: Recent Labs  Lab 03/17/18 1239 03/18/18 0538  WBC 10.9* 10.8*  NEUTROABS 6.3  --   HGB 13.4 12.7  HCT 39.5 38.0  MCV 96.8 96.9  PLT 271 099   Basic Metabolic Panel: Recent Labs  Lab 03/17/18 1239 03/18/18 0538 03/19/18 0542 03/20/18 0504  NA 142 141  --   --   K 3.6 3.6  --   --   CL 110 110  --   --   CO2 23 25  --   --   GLUCOSE 91 90  --   --   BUN 9 8  --   --   CREATININE 0.75 0.79 0.81 0.81  CALCIUM 8.8* 8.4*  --   --   MG  --  2.0  --   --   PHOS  --  3.5  --   --    GFR: Estimated Creatinine Clearance: 90.1 mL/min (by C-G formula based on SCr of 0.81 mg/dL). Liver Function Tests: Recent Labs  Lab 03/17/18 1239 03/18/18 0538  AST 12* 12*  ALT 12 12  ALKPHOS 72 61  BILITOT 0.6 0.5  PROT 6.3* 6.0*  ALBUMIN 3.2* 3.0*   No results for input(s): LIPASE, AMYLASE in the last 168 hours. No results for input(s): AMMONIA in the last 168 hours. Coagulation Profile: No results for input(s): INR, PROTIME in the last 168 hours. Cardiac Enzymes: No results for input(s): CKTOTAL, CKMB, CKMBINDEX, TROPONINI in the last 168 hours. BNP (last 3 results) No results for input(s): PROBNP in the last 8760 hours. HbA1C: Recent Labs    03/18/18 0538  HGBA1C 5.4   CBG: Recent Labs  Lab 03/18/18 0723 03/19/18 0744 03/20/18 0803  GLUCAP 87 69* 81   Lipid  Profile: No results for input(s): CHOL, HDL, LDLCALC, TRIG, CHOLHDL, LDLDIRECT in the last 72 hours. Thyroid Function Tests: Recent Labs    03/17/18 1837  TSH 3.297   Anemia Panel: No results for input(s): VITAMINB12, FOLATE, FERRITIN, TIBC, IRON, RETICCTPCT in the last 72 hours. Urine analysis:    Component Value Date/Time   COLORURINE YELLOW 02/25/2017 2058  APPEARANCEUR CLEAR 02/25/2017 2058   LABSPEC 1.018 02/25/2017 2058   PHURINE 5.0 02/25/2017 2058   GLUCOSEU NEGATIVE 02/25/2017 2058   HGBUR MODERATE (A) 02/25/2017 2058   BILIRUBINUR NEGATIVE 02/25/2017 2058   KETONESUR NEGATIVE 02/25/2017 2058   PROTEINUR NEGATIVE 02/25/2017 2058   UROBILINOGEN 1.0 08/08/2014 2057   NITRITE NEGATIVE 02/25/2017 2058   LEUKOCYTESUR NEGATIVE 02/25/2017 2058   Sepsis Labs: @LABRCNTIP (procalcitonin:4,lacticidven:4) ) Recent Results (from the past 240 hour(s))  Blood culture (routine x 2)     Status: None (Preliminary result)   Collection Time: 03/17/18 12:40 PM  Result Value Ref Range Status   Specimen Description   Final    BLOOD RIGHT ANTECUBITAL Performed at [facility], [place] [address] [place], [place] [address]    Special Requests   Final    BOTTLES DRAWN AEROBIC AND ANAEROBIC Blood Culture results may not be optimal due to an inadequate volume of blood received in culture bottles Performed at [facility] [address], [place], [place] [address]    Culture   Final    NO GROWTH 2 DAYS Performed at [facility] [address], [place], [place] [address]    Report Status PENDING  Incomplete  Blood culture (routine x 2)     Status: None (Preliminary result)   Collection Time: 03/17/18  2:52 PM  Result Value Ref Range Status   Specimen Description   Final    BLOOD RIGHT FOREARM Performed at [facility] [address] [place], [place] [address]    Special Requests   Final    BOTTLES DRAWN AEROBIC AND  ANAEROBIC Blood Culture adequate volume Performed at [facility] [address], [place], [place] [address]    Culture   Final    NO GROWTH 2 DAYS Performed at [facility] [address] [place], [place] [address]    Report Status PENDING  Incomplete  Aerobic Culture (superficial specimen)     Status: None   Collection Time: 03/17/18  3:24 PM  Result Value Ref Range Status   Specimen Description   Final    ABSCESS LEFT ELBOW Performed at [facility] [address], [place], [place] [address]    Special Requests   Final    NONE Performed at [facility], [place] [address], [place], [place] [address]    Gram Stain   Final    MODERATE WBC PRESENT, PREDOMINANTLY PMN RARE GRAM POSITIVE COCCI Performed at [facility], [place] [address] [place], [place] [address]    Culture FEW METHICILLIN RESISTANT STAPHYLOCOCCUS AUREUS  Final   Report Status 03/20/2018 FINAL  Final   Organism ID, Bacteria METHICILLIN RESISTANT STAPHYLOCOCCUS AUREUS  Final      Susceptibility   Methicillin resistant staphylococcus aureus - MIC*    CIPROFLOXACIN >=8 RESISTANT Resistant     ERYTHROMYCIN >=8 RESISTANT Resistant     GENTAMICIN <=0.5 SENSITIVE Sensitive     OXACILLIN >=4 RESISTANT Resistant     TETRACYCLINE <=1 SENSITIVE Sensitive     VANCOMYCIN <=0.5 SENSITIVE Sensitive     TRIMETH/SULFA <=10 SENSITIVE Sensitive     CLINDAMYCIN <=0.25 SENSITIVE Sensitive     RIFAMPIN <=0.5 SENSITIVE Sensitive     Inducible Clindamycin NEGATIVE Sensitive     * FEW METHICILLIN RESISTANT STAPHYLOCOCCUS AUREUS         Radiology Studies: No results found.    Scheduled Meds: . bacitracin  Topical BID  . clotrimazole  1 Applicatorful Vaginal QHS  . docusate sodium  100 mg Oral BID  . enoxaparin (LOVENOX) injection  40 mg Subcutaneous Q24H  . fluconazole  100 mg Oral Daily  . mupirocin ointment  1 application Topical BID   Continuous  Infusions: . sodium chloride 75 mL/hr at 03/20/18 0600  . piperacillin-tazobactam (ZOSYN)  IV 3.375 g (03/20/18 0630)  . vancomycin Stopped (03/19/18 1944)     LOS: 3 days    Time spent in minutes: 35    Debbe Odea, MD [facility] Pager: [email or web address] Password [email or web address] 03/20/2018, 1:43 PM

## 2018-03-20 NOTE — Progress Notes (Signed)
Occupational Therapy Treatment Patient Details Name: PASQUALINA COLASURDO MRN: [id number] DOB: 1971-05-26 Today's Date: 03/20/2018    History of present illness 47 y.o. female who presents with LUE pain and swelling. She was initially evaluated at [facility] urgent care on 03-09-18, where she reported having sustained some type of insect bite at work.  Now s/p bedside I&D on 03/17/18.   OT comments  Education provided regarding edema control  Follow Up Recommendations  No OT follow up    Equipment Recommendations  None recommended by OT    Recommendations for Other Services      Precautions / Restrictions Restrictions Other Position/Activity Restrictions: LUE edema control       Mobility Bed Mobility Overal bed mobility: Modified Independent                Transfers Overall transfer level: Needs assistance Equipment used: None Transfers: Sit to/from Stand Sit to Stand: Supervision              Balance Overall balance assessment: No apparent balance deficits (not formally assessed)                                         ADL either performed or assessed with clinical judgement   ADL Overall ADL's : Needs assistance/impaired                                     Functional mobility during ADLs: Supervision/safety General ADL Comments: Pt able to shower this day, don clothes at I level.  OT session focused on ROM education and elevation education for LUE to help decrease edema. Pt with noted edema in LUE in all areas.  Educated on ROM for shoulder, elbow , wrist and hand/  as well as keeping LUE propped on 3 pillows               Cognition Arousal/Alertness: Awake/alert Behavior During Therapy: WFL for tasks assessed/performed Overall Cognitive Status: Within Functional Limits for tasks assessed                                                     Pertinent Vitals/ Pain       Faces Pain Scale: Hurts little  more Pain Location:  LUE Pain Descriptors / Indicators: Aching;Sore Pain Intervention(s): Limited activity within patient's tolerance;Monitored during session;Repositioned     Prior Functioning/Environment              Frequency  Min 2X/week        Progress Toward Goals  OT Goals(current goals can now be found in the care plan section)     Acute Rehab OT Goals Patient Stated Goal: never see another spider again. Eager for LUE edema to decrease and regain functional use of LUE. OT Goal Formulation: With patient Time For Goal Achievement: 03/25/18 Potential to Achieve Goals: Good  Plan Discharge plan remains appropriate       AM-PAC PT "6 Clicks" Daily Activity     Outcome Measure   Help from another person eating meals?: None Help from another person taking care of personal grooming?: None Help from another person toileting, which includes using  toliet, bedpan, or urinal?: None Help from another person bathing (including washing, rinsing, drying)?: None Help from another person to put on and taking off regular upper body clothing?: None Help from another person to put on and taking off regular lower body clothing?: None 6 Click Score: 24    End of Session    OT Visit Diagnosis: Pain Pain - Right/Left: Left Pain - part of body: Arm   Activity Tolerance Patient tolerated treatment well   Patient Left with call bell/phone within reach;in chair   Nurse Communication          Time: 1040-1100 OT Time Calculation (min): 20 min  Charges: OT Evaluation $OT Eval Low Complexity: 1 Low OT Treatments $Therapeutic Exercise: 8-22 mins  [place], [place]   Betsy Pries 03/20/2018, 11:56 AM

## 2018-03-21 DIAGNOSIS — L02414 Cutaneous abscess of left upper limb: Secondary | ICD-10-CM

## 2018-03-21 DIAGNOSIS — M797 Fibromyalgia: Secondary | ICD-10-CM

## 2018-03-21 LAB — GLUCOSE, CAPILLARY: Glucose-Capillary: 81 mg/dL (ref 70–99)

## 2018-03-21 LAB — CREATININE, SERUM
Creatinine, Ser: 0.76 mg/dL (ref 0.44–1.00)
GFR calc Af Amer: >60 mL/min (ref >60)
GFR calc non Af Amer: >60 mL/min (ref >60)

## 2018-03-21 MED ORDER — DOCUSATE SODIUM 100 MG PO CAPS: 100.0000 mg | ORAL_CAPSULE | Freq: Two times a day (BID) | ORAL | 0 refills | Status: DC

## 2018-03-21 MED ORDER — BACITRACIN ZINC 500 UNIT/GM EX OINT: TOPICAL_OINTMENT | Freq: Two times a day (BID) | CUTANEOUS | 0 refills | Status: DC

## 2018-03-21 MED ORDER — CLOTRIMAZOLE 1 % VA CREA: 1.0000 | TOPICAL_CREAM | Freq: Every day | VAGINAL | 0 refills | Status: DC

## 2018-03-21 MED ORDER — BISACODYL 10 MG RE SUPP: 10.0000 mg | Freq: Every day | RECTAL | 0 refills | Status: DC | PRN

## 2018-03-21 MED ORDER — POLYETHYLENE GLYCOL 3350 17 G PO PACK: 17.0000 g | PACK | Freq: Two times a day (BID) | ORAL | 0 refills | Status: DC | PRN

## 2018-03-21 MED ORDER — SULFAMETHOXAZOLE-TRIMETHOPRIM 800-160 MG PO TABS: 1.0000 | ORAL_TABLET | Freq: Two times a day (BID) | ORAL | 0 refills | Status: AC

## 2018-03-21 MED ORDER — HYDROCODONE-ACETAMINOPHEN 5-325 MG PO TABS: 1.0000 | ORAL_TABLET | ORAL | 0 refills | Status: DC | PRN

## 2018-03-21 NOTE — Discharge Summary (Signed)
Physician Discharge Summary  Caitlin Valdez WRU:[id number] DOB: 05/26/1971 DOA: 03/17/2018  PCP: [facility]  Admit date: 03/17/2018 Discharge date: 03/21/2018  Admitted From: home Disposition:  home   Recommendations for Outpatient Follow-up:  1. CBC and Bmet on Friday (at follow up)   Discharge Condition:  stable   CODE STATUS:  Full code   Consultations:  Hand surgery- Dr Grandville Silos    Discharge Diagnoses:  Principal Problem:   Cellulitis and Abscess (MRSA) of left arm Active Problems:   Fibromyalgia   Obesity   Current smoker   Anxiety and depression        Brief Summary: Caitlin Valdez is a 47 y.o.femalewith medical history significant ofanxiety, depression, fibromyalgia, history of tobacco abuse, obesity, history of migraines, history of peripheral vascular disease in the right leg, history of stroke, history of trichomonas, and other comorbidities presents to the hospital with a chief complaint of right arm pain and swelling associated with erythema and redness starting around 03/06/18.  It significantly worsened after an incision and drainage was done at [facility] urgent care on 9/2. She had failed to take antibiotics prescribed on 8/26 (due to cost) and later declined an I and D on 8/31  Hospital Course:  Principal Problem:   Cellulitis abscess of left arm - s/p I and D in urgent care on 9/2 and again on 9/3 at bedside by Dr Grandville Silos (hand surgery) - she did not take outpt antibiotics initially- later declined I and D and thus infection continued to progress - rare gr + cocci on gram stain (wound culture)- culture reveals MRSA- blood cultures NGTD - cellulitis extended from left shoulder to hand on admission- edema in arm has significantly improved but has not resolved completely - wound continues to drain and dressings are being changed 2 x day - [facility] has been ordered - patient has been taught how to change dressings as well - she has received 5 days  of IV antibiotics - I have transitioned her from Vanc to Bactrim DS today- strictly advised to not stop or miss antibiotics  Active Problems:  Nausea - resolved    Current smoker - trying to quit- have counseled her- declining nicotine patch    Obesity -Body mass index is 33.55 kg/m.   Discharge Exam: Vitals:   03/20/18 2133 03/21/18 0602  BP: 137/83 130/80  Pulse: 75 75  Resp: 18 17  Temp: 98.2 F (36.8 C) 98.3 F (36.8 C)  SpO2: 98% 100%   Vitals:   03/20/18 0627 03/20/18 1337 03/20/18 2133 03/21/18 0602  BP:  (!) 129/100 137/83 130/80  Pulse:  66 75 75  Resp:  18 18 17   Temp:  98.4 F (36.9 C) 98.2 F (36.8 C) 98.3 F (36.8 C)  TempSrc:  Oral Oral Oral  SpO2:  98% 98% 100%  Weight: 85.9 kg     Height:        General: Pt is alert, awake, not in acute distress Cardiovascular: RRR, S1/S2 +, no rubs, no gallops Respiratory: CTA bilaterally, no wheezing, no rhonchi Abdominal: Soft, NT, ND, bowel sounds + Extremities: mild edema in left hand and forearm- ulcer noted on post forearm about the size of a quarter with mild drainage on dressing- no surrounding cellulitis   Discharge Instructions  Discharge Instructions    Diet - low sodium heart healthy   Complete by:  As directed    Increase activity slowly   Complete by:  As directed  Allergies as of 03/21/2018      Reactions   Dust Mite Extract Other (See Comments)   Allergy test 10 years ago      Medication List    TAKE these medications   bacitracin ointment Apply topically 2 (two) times daily.   bisacodyl 10 MG suppository Commonly known as:  DULCOLAX Place 1 suppository (10 mg total) rectally daily as needed for moderate constipation.   clotrimazole 1 % vaginal cream Commonly known as:  GYNE-LOTRIMIN Place 1 Applicatorful vaginally at bedtime.   docusate sodium 100 MG capsule Commonly known as:  COLACE Take 1 capsule (100 mg total) by mouth 2 (two) times daily.    HYDROcodone-acetaminophen 5-325 MG tablet Commonly known as:  NORCO/VICODIN Take 1-2 tablets by mouth every 4 (four) hours as needed for moderate pain.   ibuprofen 200 MG tablet Commonly known as:  ADVIL,MOTRIN Take 600 mg by mouth every 6 (six) hours as needed for headache.   meloxicam 7.5 MG tablet Commonly known as:  MOBIC Take 1 tablet (7.5 mg total) by mouth daily.   mupirocin ointment 2 % Commonly known as:  BACTROBAN Apply 1 application topically 2 (two) times daily.   polyethylene glycol packet Commonly known as:  MIRALAX / GLYCOLAX Take 17 g by mouth 2 (two) times daily as needed for mild constipation.   sulfamethoxazole-trimethoprim 800-160 MG tablet Commonly known as:  BACTRIM DS,SEPTRA DS Take 1 tablet by mouth 2 (two) times daily for 14 days.      Follow-up Information    Ladell Pier, MD Follow up.   Specialty:  Internal Medicine Contact information: [place] [address] [phone]        [facility] Follow up.   Specialty:  Family Medicine Why:  by this Friday.  Contact information: [address] [place] [address] [phone]          Allergies  Allergen Reactions  . Dust Mite Extract Other (See Comments)    Allergy test 10 years ago     Procedures/Studies:    Dg Forearm Left  Result Date: 03/17/2018 CLINICAL DATA:  Bitten by a spider with area of swelling there was lanced and drained yesterday, the patient has been on antibiotic treatment EXAM: LEFT FOREARM - 2 VIEW COMPARISON:  None. FINDINGS: No acute fracture is seen. Alignment is normal. No elbow joint effusion is noted. There is a focus of soft tissue swelling on the lateral view along the dorsal aspect near the elbow joint which may represent the area of spider bite. Some higher attenuation material is noted within this which may represent cleansing solution or overlapping bandage. Clinical correlation is  recommended. No underlying bony abnormality is seen. IMPRESSION: Focus of soft tissue swelling along the dorsal aspect of the proximal left forearm as noted above probably at the site of spider bite. No underlying bony abnormality. Electronically Signed   By: Ivar Drape M.D.   On: 03/17/2018 13:52   Dg Humerus Left  Result Date: 03/17/2018 CLINICAL DATA:  Bitten by spider, recent [place] of wound with drainage, recent antibiotic treatment EXAM: LEFT HUMERUS - 2+ VIEW COMPARISON:  None. FINDINGS: Views of the left humerus show no abnormality. No significant soft tissue swelling is seen and no bony abnormality is noted. IMPRESSION: Negative. Electronically Signed   By: Ivar Drape M.D.   On: 03/17/2018 13:52     The results of significant diagnostics from this hospitalization (including  imaging, microbiology, ancillary and laboratory) are listed below for reference.     Microbiology: Recent Results (from the past 240 hour(s))  Blood culture (routine x 2)     Status: None (Preliminary result)   Collection Time: 03/17/18 12:40 PM  Result Value Ref Range Status   Specimen Description   Final    BLOOD RIGHT ANTECUBITAL Performed at [facility] [address] [place], [place] [address]    Special Requests   Final    BOTTLES DRAWN AEROBIC AND ANAEROBIC Blood Culture results may not be optimal due to an inadequate volume of blood received in culture bottles Performed at [facility] [address], [place], [place] [address]    Culture   Final    NO GROWTH 4 DAYS Performed at [facility], [place] [address] [place], [place] [address]    Report Status PENDING  Incomplete  Blood culture (routine x 2)     Status: None (Preliminary result)   Collection Time: 03/17/18  2:52 PM  Result Value Ref Range Status   Specimen Description   Final    BLOOD RIGHT FOREARM Performed at [facility] [address], [place], [place] [address]     Special Requests   Final    BOTTLES DRAWN AEROBIC AND ANAEROBIC Blood Culture adequate volume Performed at [facility] [address] [place], [place] [address]    Culture   Final    NO GROWTH 4 DAYS Performed at [facility], [place] [address], [place], [place] [address]    Report Status PENDING  Incomplete  Aerobic Culture (superficial specimen)     Status: None   Collection Time: 03/17/18  3:24 PM  Result Value Ref Range Status   Specimen Description   Final    ABSCESS LEFT ELBOW Performed at [facility] [address] [place] [address] [place], [place] [address]    Special Requests   Final    NONE Performed at [facility], [place] [address] [place], [place] [address]    Gram Stain   Final    MODERATE WBC PRESENT, PREDOMINANTLY PMN RARE GRAM POSITIVE COCCI Performed at [facility], [place] [address] [place], [place] [address]    Culture FEW METHICILLIN RESISTANT STAPHYLOCOCCUS AUREUS  Final   Report Status 03/20/2018 FINAL  Final   Organism ID, Bacteria METHICILLIN RESISTANT STAPHYLOCOCCUS AUREUS  Final      Susceptibility   Methicillin resistant staphylococcus aureus - MIC*    CIPROFLOXACIN >=8 RESISTANT Resistant     ERYTHROMYCIN >=8 RESISTANT Resistant     GENTAMICIN <=0.5 SENSITIVE Sensitive     OXACILLIN >=4 RESISTANT Resistant     TETRACYCLINE <=1 SENSITIVE Sensitive     VANCOMYCIN <=0.5 SENSITIVE Sensitive     TRIMETH/SULFA <=10 SENSITIVE Sensitive     CLINDAMYCIN <=0.25 SENSITIVE Sensitive     RIFAMPIN <=0.5 SENSITIVE Sensitive     Inducible Clindamycin NEGATIVE Sensitive     * FEW METHICILLIN RESISTANT STAPHYLOCOCCUS AUREUS  MRSA PCR Screening     Status: None   Collection Time: 03/20/18  1:52 PM  Result Value Ref Range Status   MRSA by PCR NEGATIVE NEGATIVE Final    Comment:        The GeneXpert MRSA Assay (FDA approved for NASAL specimens only), is one component of a comprehensive MRSA  colonization surveillance program. It is not intended to diagnose MRSA infection nor to guide or monitor treatment  for MRSA infections. Performed at [facility], [place] [address], [place], [place] [address]      Labs: BNP (last 3 results) No results for input(s): BNP in the last 8760 hours. Basic Metabolic Panel: Recent Labs  Lab 03/17/18 1239 03/18/18 0538 03/19/18 0542 03/20/18 0504 03/21/18 0548  NA 142 141  --   --   --   K 3.6 3.6  --   --   --   CL 110 110  --   --   --   CO2 23 25  --   --   --   GLUCOSE 91 90  --   --   --   BUN 9 8  --   --   --   CREATININE 0.75 0.79 0.81 0.81 0.76  CALCIUM 8.8* 8.4*  --   --   --   MG  --  2.0  --   --   --   PHOS  --  3.5  --   --   --    Liver Function Tests: Recent Labs  Lab 03/17/18 1239 03/18/18 0538  AST 12* 12*  ALT 12 12  ALKPHOS 72 61  BILITOT 0.6 0.5  PROT 6.3* 6.0*  ALBUMIN 3.2* 3.0*   No results for input(s): LIPASE, AMYLASE in the last 168 hours. No results for input(s): AMMONIA in the last 168 hours. CBC: Recent Labs  Lab 03/17/18 1239 03/18/18 0538  WBC 10.9* 10.8*  NEUTROABS 6.3  --   HGB 13.4 12.7  HCT 39.5 38.0  MCV 96.8 96.9  PLT 271 296   Cardiac Enzymes: No results for input(s): CKTOTAL, CKMB, CKMBINDEX, TROPONINI in the last 168 hours. BNP: Invalid input(s): POCBNP CBG: Recent Labs  Lab 03/18/18 0723 03/19/18 0744 03/20/18 0803 03/21/18 0736  GLUCAP 87 69* 81 81   D-Dimer No results for input(s): DDIMER in the last 72 hours. Hgb A1c No results for input(s): HGBA1C in the last 72 hours. Lipid Profile No results for input(s): CHOL, HDL, LDLCALC, TRIG, CHOLHDL, LDLDIRECT in the last 72 hours. Thyroid function studies No results for input(s): TSH, T4TOTAL, T3FREE, THYROIDAB in the last 72 hours.  Invalid input(s): FREET3 Anemia work up No results for input(s): VITAMINB12, FOLATE, FERRITIN, TIBC, IRON, RETICCTPCT in the last 72 hours. Urinalysis     Component Value Date/Time   COLORURINE YELLOW 02/25/2017 2058   APPEARANCEUR CLEAR 02/25/2017 2058   LABSPEC 1.018 02/25/2017 2058   PHURINE 5.0 02/25/2017 2058   GLUCOSEU NEGATIVE 02/25/2017 2058   HGBUR MODERATE (A) 02/25/2017 2058   BILIRUBINUR NEGATIVE 02/25/2017 2058   [place] 02/25/2017 2058   PROTEINUR NEGATIVE 02/25/2017 2058   UROBILINOGEN 1.0 08/08/2014 2057   NITRITE NEGATIVE 02/25/2017 2058   LEUKOCYTESUR NEGATIVE 02/25/2017 2058   Sepsis Labs Invalid input(s): PROCALCITONIN,  WBC,  LACTICIDVEN Microbiology Recent Results (from the past 240 hour(s))  Blood culture (routine x 2)     Status: None (Preliminary result)   Collection Time: 03/17/18 12:40 PM  Result Value Ref Range Status   Specimen Description   Final    BLOOD RIGHT ANTECUBITAL Performed at [facility], [place] [address] [place], [place] [address]    Special Requests   Final    BOTTLES DRAWN AEROBIC AND ANAEROBIC Blood Culture results may not be optimal due to an inadequate volume of blood received in culture bottles Performed at [facility] [address] [place],  [address]    Culture  Final    NO GROWTH 4 DAYS Performed at [facility], [place] [address] [place], [place] [address]    Report Status PENDING  Incomplete  Blood culture (routine x 2)     Status: None (Preliminary result)   Collection Time: 03/17/18  2:52 PM  Result Value Ref Range Status   Specimen Description   Final    BLOOD RIGHT FOREARM Performed at [facility] [address], [place], [place] [address]    Special Requests   Final    BOTTLES DRAWN AEROBIC AND ANAEROBIC Blood Culture adequate volume Performed at [facility] [address] [place], [place] [address]    Culture   Final    NO GROWTH 4 DAYS Performed at [facility], [place] [address] [place], [place] [address]    Report Status PENDING  Incomplete   Aerobic Culture (superficial specimen)     Status: None   Collection Time: 03/17/18  3:24 PM  Result Value Ref Range Status   Specimen Description   Final    ABSCESS LEFT ELBOW Performed at [facility] [address] [place], [place] [address]    Special Requests   Final    NONE Performed at [facility], [place] [address], [place], [place] [address]    Gram Stain   Final    MODERATE WBC PRESENT, PREDOMINANTLY PMN RARE GRAM POSITIVE COCCI Performed at [facility], [place] [address], [place], [place] [address]    Culture FEW METHICILLIN RESISTANT STAPHYLOCOCCUS AUREUS  Final   Report Status 03/20/2018 FINAL  Final   Organism ID, Bacteria METHICILLIN RESISTANT STAPHYLOCOCCUS AUREUS  Final      Susceptibility   Methicillin resistant staphylococcus aureus - MIC*    CIPROFLOXACIN >=8 RESISTANT Resistant     ERYTHROMYCIN >=8 RESISTANT Resistant     GENTAMICIN <=0.5 SENSITIVE Sensitive     OXACILLIN >=4 RESISTANT Resistant     TETRACYCLINE <=1 SENSITIVE Sensitive     VANCOMYCIN <=0.5 SENSITIVE Sensitive     TRIMETH/SULFA <=10 SENSITIVE Sensitive     CLINDAMYCIN <=0.25 SENSITIVE Sensitive     RIFAMPIN <=0.5 SENSITIVE Sensitive     Inducible Clindamycin NEGATIVE Sensitive     * FEW METHICILLIN RESISTANT STAPHYLOCOCCUS AUREUS  MRSA PCR Screening     Status: None   Collection Time: 03/20/18  1:52 PM  Result Value Ref Range Status   MRSA by PCR NEGATIVE NEGATIVE Final    Comment:        The GeneXpert MRSA Assay (FDA approved for NASAL specimens only), is one component of a comprehensive MRSA colonization surveillance program. It is not intended to diagnose MRSA infection nor to guide or monitor treatment for MRSA infections. Performed at [facility], [place] [address] [place], [place] [address]      Time coordinating discharge in minutes: 36  SIGNED:   Debbe Odea, MD  [facility] 03/21/2018, 2:19  PM Pager   If 7PM-7AM, please contact night-coverage [email or web address] Password [email or web address]

## 2018-03-21 NOTE — Progress Notes (Addendum)
Verbal order:  Caitlin Valdez is to be excused from work for 2 weeks due to hospitalization for infection. Wound is actively draining; per Dr. Debbe Odea, [place].

## 2018-03-22 LAB — CULTURE, BLOOD (ROUTINE X 2)
Culture: NO GROWTH
Culture: NO GROWTH
Special Requests: ADEQUATE

## 2018-04-02 ENCOUNTER — Emergency Department (HOSPITAL_COMMUNITY)
Admission: EM | Admit: 2018-04-02 | Discharge: 2018-04-02 | Disposition: A | Discharge status: Home / Self Care | Payer: Self-pay | Attending: Emergency Medicine | Admitting: Emergency Medicine

## 2018-04-02 ENCOUNTER — Encounter (HOSPITAL_COMMUNITY): Payer: Self-pay

## 2018-04-02 ENCOUNTER — Other Ambulatory Visit: Payer: Self-pay

## 2018-04-02 DIAGNOSIS — M25522 Pain in left elbow: Secondary | ICD-10-CM | POA: Insufficient documentation

## 2018-04-02 DIAGNOSIS — Z9889 Other specified postprocedural states: Secondary | ICD-10-CM | POA: Insufficient documentation

## 2018-04-02 LAB — COMPREHENSIVE METABOLIC PANEL
ALT: 19 U/L (ref 0–44)
AST: 16 U/L (ref 15–41)
Albumin: 4 g/dL (ref 3.5–5.0)
Alkaline Phosphatase: 59 U/L (ref 38–126)
Anion gap: 10 (ref 5–15)
BUN: 9 mg/dL (ref 6–20)
CO2: 20 mmol/L — ABNORMAL LOW (ref 22–32)
Calcium: 9.5 mg/dL (ref 8.9–10.3)
Chloride: 109 mmol/L (ref 98–111)
Creatinine, Ser: 0.87 mg/dL (ref 0.44–1.00)
GFR calc Af Amer: >60 mL/min (ref >60)
GFR calc non Af Amer: >60 mL/min (ref >60)
Glucose, Bld: 81 mg/dL (ref 70–99)
Potassium: 4.1 mmol/L (ref 3.5–5.1)
Sodium: 139 mmol/L (ref 135–145)
Total Bilirubin: 0.6 mg/dL (ref 0.3–1.2)
Total Protein: 7.5 g/dL (ref 6.5–8.1)

## 2018-04-02 LAB — CBC WITH DIFFERENTIAL/PLATELET
Basophils Absolute: 0 10*3/uL (ref 0.0–0.1)
Basophils Relative: 0 %
Eosinophils Absolute: 0.1 10*3/uL (ref 0.0–0.7)
Eosinophils Relative: 1 %
HCT: 41 % (ref 36.0–46.0)
Hemoglobin: 14.2 g/dL (ref 12.0–15.0)
Lymphocytes Relative: 59 %
Lymphs Abs: 4.3 10*3/uL — ABNORMAL HIGH (ref 0.7–4.0)
MCH: 33 pg (ref 26.0–34.0)
MCHC: 34.6 g/dL (ref 30.0–36.0)
MCV: 95.3 fL (ref 78.0–100.0)
Monocytes Absolute: 0.6 10*3/uL (ref 0.1–1.0)
Monocytes Relative: 8 %
Neutro Abs: 2.4 10*3/uL (ref 1.7–7.7)
Neutrophils Relative %: 32 %
Platelets: 290 10*3/uL (ref 150–400)
RBC: 4.3 MIL/uL (ref 3.87–5.11)
RDW: 12.2 % (ref 11.5–15.5)
WBC: 7.3 10*3/uL (ref 4.0–10.5)

## 2018-04-02 MED ORDER — HYDROCODONE-ACETAMINOPHEN 5-325 MG PO TABS: 1.0000 | ORAL_TABLET | Freq: Four times a day (QID) | ORAL | 0 refills | Status: DC | PRN

## 2018-04-02 MED ORDER — ONDANSETRON HCL 4 MG/2ML IJ SOLN: 4.0000 mg | Freq: Once | INTRAMUSCULAR | Status: DC

## 2018-04-02 NOTE — ED Provider Notes (Signed)
[facility] DEPT Provider Note  CSN: [id number] Arrival date & time: 04/02/18  1854  History   Chief Complaint Left arm pain.  HPI Caitlin Valdez is a 47 y.o. female with a past medical history  significant for spider bite with recent hospitalization who presents for evaluation of left arm pain. States she was hospitalized for cellulitis secondary to insect bite with bedside debridement 2 weeks ago. States she awoke this morning with pain to the left elbow where her surgery site is located. She does not know if she laid on the area or if she did anything to aggravate the area. States she feels generally run down since being hospitalized. States she felt warm two night ago and took her temperature and it was 99.0. Denies chills, lightheadedness, decreased ROM to upper extremity, redness, warmth to surgical site, drainage to site, foul odor to site. Patient states she is currently on Bactrim and has not missed a dose.  HPI  Past Medical History:  Diagnosis Date  . Abdominal pain, right lower quadrant   . Anxiety   . Depression   . Fibromyalgia   . GERD (gastroesophageal reflux disease)   . Migraine   . Obesity   . Peripheral vascular disease (HCC)    RIGHT LEG  . PONV (postoperative nausea and vomiting)   . Stroke ([place])   . Trichomonas 2012    Patient Active Problem List   Diagnosis Date Noted  . Abscess of arm, left 03/21/2018  . Cellulitis of arm 03/17/2018  . Anxiety and depression 03/17/2018  . Bacterial vaginitis 02/25/2017  . Hematuria 06/27/2016  . S/P endometrial ablation 06/27/2016  . Lower abdominal pain 06/27/2016  . Migraine without status migrainosus, not intractable 06/27/2016  . Vitamin D insufficiency 02/11/2016  . De Quervain's tenosynovitis, left 02/09/2016  . Vitamin B12 deficiency 10/17/2015  . Vaginal discharge 10/16/2015  . Allergic rhinitis 12/29/2014  . Current smoker 11/22/2014  . Obesity 06/29/2014  . Spinal stenosis  in cervical region 02/22/2014  . Fibromyalgia 07/15/2009    Past Surgical History:  Procedure Laterality Date  . CESAREAN SECTION    . FRACTURE SURGERY    . HYSTEROSCOPY WITH NOVASURE N/A 08/08/2014   Procedure:  NOVASURE;  Surgeon: Emily Filbert, MD;  Location: [facility];  Service: Gynecology;  Laterality: N/A;  . VEIN SURGERY       OB History    Gravida  2   Para  2   Term  2   Preterm      AB      Living  2     SAB      TAB      Ectopic      Multiple      Live Births               Home Medications    Prior to Admission medications   Medication Sig Start Date End Date Taking? Authorizing Provider  aspirin-acetaminophen-caffeine (EXCEDRIN MIGRAINE) [id number] MG tablet Take 2 tablets by mouth every 6 (six) hours as needed for headache.   Yes [provider]  bacitracin ointment Apply topically 2 (two) times daily. 03/21/18  Yes Debbe Odea, MD  bisacodyl (DULCOLAX) 10 MG suppository Place 1 suppository (10 mg total) rectally daily as needed for moderate constipation. 03/21/18  Yes Debbe Odea, MD  clotrimazole (GYNE-LOTRIMIN) 1 % vaginal cream Place 1 Applicatorful vaginally at bedtime. 03/21/18  Yes Debbe Odea, MD  meloxicam (MOBIC) 7.5 MG tablet  Take 1 tablet (7.5 mg total) by mouth daily. 03/09/18  Yes Yu, Amy V, PA-C  mupirocin ointment (BACTROBAN) 2 % Apply 1 application topically 2 (two) times daily. 03/09/18  Yes Yu, Amy V, PA-C  sulfamethoxazole-trimethoprim (BACTRIM DS,SEPTRA DS) 800-160 MG tablet Take 1 tablet by mouth 2 (two) times daily for 14 days. 03/21/18 04/04/18 Yes Debbe Odea, MD  HYDROcodone-acetaminophen (NORCO/VICODIN) 5-325 MG tablet Take 1 tablet by mouth every 6 (six) hours as needed for severe pain. 04/02/18   Wenceslaus Gist A, PA-C  polyethylene glycol (MIRALAX / GLYCOLAX) packet Take 17 g by mouth 2 (two) times daily as needed for mild constipation. 03/21/18   Debbe Odea, MD    Family History Family History  Problem Relation  Age of Onset  . Diabetes Mother   . Hypertension Mother   . Diabetes Father   . Hypertension Father   . Diabetes Other   . Hypertension Other   . Other Neg Hx     Social History Social History   Tobacco Use  . Smoking status: Current Every Day Smoker    Packs/day: 1.00    Years: 5.00    Pack years: 5.00    Types: Cigarettes  . Smokeless tobacco: Never Used  Substance Use Topics  . Alcohol use: No  . Drug use: No     Allergies   Patient has no known allergies.   Review of Systems Review of Systems  Constitutional: Negative for appetite change, chills, diaphoresis, fatigue, fever and unexpected weight change.  Respiratory: Negative.   Cardiovascular: Negative.   Gastrointestinal: Negative.   Musculoskeletal: Negative.        Pain to the left elbow surgical site.  Skin: Positive for wound.     Physical Exam Updated Vital Signs BP (!) 102/58 (BP Location: Right Arm)   Pulse 76   Temp 98.6 F (37 C) (Oral)   Resp 18   Ht 5\' 3"  (1.6 m)   Wt 85.9 kg   SpO2 98%   BMI 33.55 kg/m   Physical Exam  Constitutional: She appears well-developed and well-nourished. No distress.  HENT:  Head: Normocephalic and atraumatic.  Mouth/Throat: Oropharynx is clear and moist.  Eyes: Pupils are equal, round, and reactive to light.  Neck: Normal range of motion.  Cardiovascular: Normal rate, regular rhythm, normal heart sounds, intact distal pulses and normal pulses. Exam reveals no friction rub.  No murmur heard. Pulmonary/Chest: Effort normal and breath sounds normal. No stridor. No respiratory distress. She has no wheezes. She has no rales.  Abdominal: Soft. Bowel sounds are normal. She exhibits no distension and no mass. There is no tenderness. There is no guarding.  Musculoskeletal: Normal range of motion. She exhibits no edema or deformity.       Left elbow: She exhibits normal range of motion, no swelling, no effusion, no deformity and no laceration. Tenderness found.    Full ROM to left upper extremity. Tenderness to palpation of the elbow at the surgical site.  Neurological: She is alert. She has normal strength.  Skin: Skin is warm and dry. No abrasion, no bruising, no ecchymosis, no laceration, no petechiae and no rash noted. She is not diaphoretic. No erythema.  1.5x1.5cm circular area to the lateral left elbow with previous surgical site. Area withouth erythema or drainage. No foul odor to site. Site with granulated tissue at edges. No surrounding erythema, warmth or edema to left elbow.  Psychiatric: She has a normal mood and affect.  Nursing note and  vitals reviewed.    ED Treatments / Results  Labs (all labs ordered are listed, but only abnormal results are displayed) Labs Reviewed  CBC WITH DIFFERENTIAL/PLATELET - Abnormal; Notable for the following components:      Result Value   Lymphs Abs 4.3 (*)    All other components within normal limits  COMPREHENSIVE METABOLIC PANEL - Abnormal; Notable for the following components:   CO2 20 (*)    All other components within normal limits  URINALYSIS, ROUTINE W REFLEX MICROSCOPIC    EKG None  Radiology No results found.  Procedures Procedures (including critical care time)  Medications Ordered in ED Medications - No data to display   Initial Impression / Assessment and Plan / ED Course  I have reviewed the triage vital signs and the nursing notes as well as surgical site.  Pertinent labs & imaging results that were available during my care of the patient were reviewed by me and considered in my medical decision making (see chart for details).  47 year old with hx cellulitis from insect bite requiring IV abx x 5 days, on current bactrim presents for evaluation of left arm pain. Pain located to area immediately surrounding surgical site. No erythema, edema or warmth. No drainage. Wound with granulated tissue at wound margins. Afebrile, non-septic, non-ill appearing.  Currently on Bactrim  from hospital dc. Full ROM to elbow without difficulty. Neurovascularly intact. Will obtain labs and re-evaluate.  Labs negative. Low suspicion for post surgical infection. Will have her follow-up with her surgeon for re-evaluation. Will give #5 norco for her pain. I have looked her up in the PMP aware database and she does not have a current controlled substance prescription. Discussed strict return precautions with patient. Patient voiced understanding.     Final Clinical Impressions(s) / ED Diagnoses   Final diagnoses:  Left elbow pain    ED Discharge Orders         Ordered    HYDROcodone-acetaminophen (NORCO/VICODIN) 5-325 MG tablet  Every 6 hours PRN     04/02/18 2249           Bhakti Labella A, PA-C 04/02/18 2317    Milton Ferguson, MD 04/02/18 2337

## 2018-04-02 NOTE — Discharge Instructions (Addendum)
You were evaluated today for left elbow pain.  The surgical site does not look infected at this time.   Please keep taking your antibiotics and follow-up with your surgeon for re-evaluation. I have prescribed you a short course of pain medication to take. Please take as prescribed.  You will need to have follow up within the next 2 days for reevaluation.  Return to the ED with any new or worsening symptoms such as:  Contact a health care provider if: You received a tetanus shot and you have swelling, severe pain, redness, or bleeding at the injection site. Your pain is not controlled with medicine. You have more redness, swelling, or pain around the wound. You have more fluid or blood coming from the wound. Your wound feels warm to the touch. You have pus or a bad smell coming from the wound. You have a fever or chills. You are nauseous or you vomit. You are dizzy. Get help right away if: You have a red streak going away from your wound. The edges of the wound open up and separate. Your wound is bleeding and the bleeding does not stop with gentle pressure. You have a rash. You faint. You have trouble breathing.

## 2018-04-02 NOTE — ED Triage Notes (Signed)
Pt was in the hospital for 5 days being treated for a spider bite on her elbow, pt now states that the area is real sore and red and she feels nauseated and tired.

## 2018-04-07 ENCOUNTER — Encounter (HOSPITAL_COMMUNITY): Payer: Self-pay

## 2018-04-07 ENCOUNTER — Ambulatory Visit (HOSPITAL_COMMUNITY)
Admission: EM | Admit: 2018-04-07 | Discharge: 2018-04-07 | Disposition: A | Discharge status: Home / Self Care | Payer: Self-pay | Attending: Family Medicine | Admitting: Family Medicine

## 2018-04-07 ENCOUNTER — Other Ambulatory Visit: Payer: Self-pay

## 2018-04-07 DIAGNOSIS — M79602 Pain in left arm: Secondary | ICD-10-CM

## 2018-04-07 MED ORDER — HYDROCODONE-ACETAMINOPHEN 5-325 MG PO TABS: 1.0000 | ORAL_TABLET | Freq: Four times a day (QID) | ORAL | 0 refills | Status: DC | PRN

## 2018-04-07 NOTE — Discharge Instructions (Addendum)
Continue to take Meloxicam daily.  Be aware, pain medications may cause drowsiness. Please do not drive, operate heavy machinery or make important decisions while on this medication, it can cloud your judgement.

## 2018-04-07 NOTE — ED Provider Notes (Signed)
Caitlin Valdez   [id number] 04/07/18 Arrival Time: 0915  ASSESSMENT & PLAN:  1. Left arm pain    Meds ordered this encounter  Medications  . HYDROcodone-acetaminophen (NORCO/VICODIN) 5-325 MG tablet    Sig: Take 1 tablet by mouth every 6 (six) hours as needed for moderate pain or severe pain.    Dispense:  8 tablet    Refill:  0   Continue to take meloxicam with food daily.  Follow-up Information    Schedule an appointment as soon as possible for a visit  with [facility]   Specialty:  Family Medicine Contact information: [address] [place] [address] [phone]          Encouraged ROM of L elbow as she tolerates. Work note given for 72 hours.  [place] Controlled Substances Registry consulted for this patient. I feel the risk/benefit ratio today is favorable for proceeding with this prescription for a controlled substance. Medication sedation precautions given.  She is planning to schedule f/u with her PCP once she has enough money to be seen. Encouraged her to f/u there as soon as possible.  Reviewed expectations re: course of current medical issues. Questions answered. Outlined signs and symptoms indicating need for more acute intervention. Patient verbalized understanding. After Visit Summary given.  SUBJECTIVE: ED record dated 04/02/2018 and hospital summary dated 03/17/2018 reviewed.  History from: patient.  Caitlin Valdez is a 47 y.o. female who is s/p hospital admission for abscess/cellulitis of her LUE. Doing much better but is reportedly still experiencing significant pain at her L elbow. Seen recently in the ED for the same. Describes as a dull pain with sharp exacerbations. No extremity sensation changes or weakness. Is wearing LUE in sling but trying to work on ROM. Wound is healing. Afebrile. No wound drainage. Occasional meloxicam. Requests something stronger. Pain is affecting her sleep.  ROS: As per  HPI.   OBJECTIVE:  Vitals:   04/07/18 1008 04/07/18 1017  BP: 116/83   Pulse: 70   Resp: 18   Temp: 98.2 F (36.8 C)   TempSrc: Oral   SpO2: 100%   Weight:  82.6 kg    General appearance: alert; no distress Extremities: warm and well perfused; symmetrical with no gross deformities; wound over L elbow is healing well; no signs of infection; tender to touch; L elbow with FROM but with discomfort; no swelling over L elbow CV: brisk extremity capillary refill Skin: warm and dry Neurologic: normal gait; normal symmetric reflexes in all extremities; normal sensation in all extremities Psychological: alert and cooperative; normal mood and affect  No Known Allergies  Past Medical History:  Diagnosis Date  . Abdominal pain, right lower quadrant   . Anxiety   . Depression   . Fibromyalgia   . GERD (gastroesophageal reflux disease)   . Migraine   . Obesity   . Peripheral vascular disease (HCC)    RIGHT LEG  . PONV (postoperative nausea and vomiting)   . Stroke ([place])   . Trichomonas 2012   Social History   Socioeconomic History  . Marital status: Divorced    Spouse name: Not on file  . Number of children: Not on file  . Years of education: Not on file  . Highest education level: Not on file  Occupational History  . Not on file  Social Needs  . Financial resource strain: Not on file  . Food insecurity:    Worry: Not on  file    Inability: Not on file  . Transportation needs:    Medical: Not on file    Non-medical: Not on file  Tobacco Use  . Smoking status: Current Every Day Smoker    Packs/day: 1.00    Years: 5.00    Pack years: 5.00    Types: Cigarettes  . Smokeless tobacco: Never Used  Substance and Sexual Activity  . Alcohol use: No  . Drug use: No  . Sexual activity: Yes    Birth control/protection: Condom  Lifestyle  . Physical activity:    Days per week: Not on file    Minutes per session: Not on file  . Stress: Not on file  Relationships  . Social  connections:    Talks on phone: Not on file    Gets together: Not on file    Attends religious service: Not on file    Active member of club or organization: Not on file    Attends meetings of clubs or organizations: Not on file    Relationship status: Not on file  Other Topics Concern  . Not on file  Social History Narrative  . Not on file   Family History  Problem Relation Age of Onset  . Diabetes Mother   . Hypertension Mother   . Diabetes Father   . Hypertension Father   . Diabetes Other   . Hypertension Other   . Other Neg Hx    Past Surgical History:  Procedure Laterality Date  . CESAREAN SECTION    . FRACTURE SURGERY    . HYSTEROSCOPY WITH NOVASURE N/A 08/08/2014   Procedure:  NOVASURE;  Surgeon: Emily Filbert, MD;  Location: [facility];  Service: Gynecology;  Laterality: N/A;  . VEIN SURGERY        Vanessa Kick, MD 04/07/18 1104

## 2018-04-07 NOTE — ED Triage Notes (Signed)
Pt states she is still having arm pain (left ). Pt state she finish the antibiotic on Saturday. Pt states she needs some pain med's.

## 2018-04-15 ENCOUNTER — Ambulatory Visit: Payer: Self-pay | Attending: Family Medicine | Admitting: Family Medicine

## 2018-04-15 ENCOUNTER — Encounter: Payer: Self-pay | Admitting: Family Medicine

## 2018-04-15 VITALS — BP 120/80 | HR 79 | Temp 98.3°F | Resp 18 | Ht 63.0 in | Wt 183.0 lb

## 2018-04-15 DIAGNOSIS — F17210 Nicotine dependence, cigarettes, uncomplicated: Secondary | ICD-10-CM | POA: Insufficient documentation

## 2018-04-15 DIAGNOSIS — Z9889 Other specified postprocedural states: Secondary | ICD-10-CM | POA: Insufficient documentation

## 2018-04-15 DIAGNOSIS — Z833 Family history of diabetes mellitus: Secondary | ICD-10-CM | POA: Insufficient documentation

## 2018-04-15 DIAGNOSIS — Z8673 Personal history of transient ischemic attack (TIA), and cerebral infarction without residual deficits: Secondary | ICD-10-CM | POA: Insufficient documentation

## 2018-04-15 DIAGNOSIS — I739 Peripheral vascular disease, unspecified: Secondary | ICD-10-CM | POA: Insufficient documentation

## 2018-04-15 DIAGNOSIS — R51 Headache: Secondary | ICD-10-CM

## 2018-04-15 DIAGNOSIS — Z6832 Body mass index (BMI) 32.0-32.9, adult: Secondary | ICD-10-CM | POA: Insufficient documentation

## 2018-04-15 DIAGNOSIS — E669 Obesity, unspecified: Secondary | ICD-10-CM | POA: Insufficient documentation

## 2018-04-15 DIAGNOSIS — Z8249 Family history of ischemic heart disease and other diseases of the circulatory system: Secondary | ICD-10-CM | POA: Insufficient documentation

## 2018-04-15 DIAGNOSIS — T148XXA Other injury of unspecified body region, initial encounter: Secondary | ICD-10-CM

## 2018-04-15 DIAGNOSIS — N898 Other specified noninflammatory disorders of vagina: Secondary | ICD-10-CM

## 2018-04-15 DIAGNOSIS — M25512 Pain in left shoulder: Secondary | ICD-10-CM

## 2018-04-15 DIAGNOSIS — H6690 Otitis media, unspecified, unspecified ear: Secondary | ICD-10-CM

## 2018-04-15 DIAGNOSIS — Z8614 Personal history of Methicillin resistant Staphylococcus aureus infection: Secondary | ICD-10-CM | POA: Insufficient documentation

## 2018-04-15 DIAGNOSIS — M797 Fibromyalgia: Secondary | ICD-10-CM | POA: Insufficient documentation

## 2018-04-15 DIAGNOSIS — R519 Headache, unspecified: Secondary | ICD-10-CM

## 2018-04-15 DIAGNOSIS — J069 Acute upper respiratory infection, unspecified: Secondary | ICD-10-CM

## 2018-04-15 DIAGNOSIS — Z23 Encounter for immunization: Secondary | ICD-10-CM

## 2018-04-15 DIAGNOSIS — L089 Local infection of the skin and subcutaneous tissue, unspecified: Secondary | ICD-10-CM

## 2018-04-15 MED ORDER — ONDANSETRON HCL 4 MG PO TABS: 4.0000 mg | ORAL_TABLET | Freq: Three times a day (TID) | ORAL | 0 refills | Status: DC | PRN

## 2018-04-15 MED ORDER — CETIRIZINE HCL 10 MG PO TABS: 10.0000 mg | ORAL_TABLET | Freq: Every day | ORAL | 1 refills | Status: DC

## 2018-04-15 MED ORDER — IBUPROFEN 600 MG PO TABS: 600.0000 mg | ORAL_TABLET | Freq: Three times a day (TID) | ORAL | 0 refills | Status: DC | PRN

## 2018-04-15 MED ORDER — DOXYCYCLINE HYCLATE 100 MG PO TABS: 100.0000 mg | ORAL_TABLET | Freq: Two times a day (BID) | ORAL | 0 refills | Status: DC

## 2018-04-15 MED ORDER — BUTALBITAL-APAP-CAFFEINE 50-325-40 MG PO TABS: 1.0000 | ORAL_TABLET | Freq: Four times a day (QID) | ORAL | 1 refills | Status: DC | PRN

## 2018-04-15 MED ORDER — FLUCONAZOLE 150 MG PO TABS: 150.0000 mg | ORAL_TABLET | Freq: Once | ORAL | 0 refills | Status: AC

## 2018-04-15 NOTE — Progress Notes (Signed)
Subjective:    Patient ID: Caitlin Valdez, female    DOB: Sep 28, 1970, 47 y.o.   MRN: [id number]  HPI 47 yo female new to the practice.  Patient reports that she was bitten by something on her left arm near the elbow near the end of August. (Per chart, patient was seen 03/09/2018 due to insect bite at the emergency department as well as on 03/14/2018, 03/16/2018 with an admission on 03/17/2018 after presenting to the emergency department.) Patient states that she did not see what better however she had onset of sharp pain and when she pulled her arm out of her sleeve, she noticed what appeared to be a bite or sting area near her elbow.  Patient states that the area was slightly painful and she had some mild swelling at the site.  Patient states that the pain and swelling worsened and 2 days later she went to the emergency department.  Patient states that she was prescribed antibiotics however she could not afford initially to get them filled.  Patient later returned to the emergency department for reevaluation and was prescribed additional antibiotics.  Patient states that on 03/16/2018 she was told that she had developed an abscess at the area and the area was opened to allow the infection to drain.  Patient states that overnight, she had severe swelling of her entire left arm.  Patient states that she went back to the urgent care where she had opening of the abscess and they immediately sent her to the emergency department.  Patient states that she also had triggering of her migraine headaches which started a few days after the end that bite and patient feels that she has had a recurrent headache almost daily since that time.  Patient states that she continues to have pain in her left shoulder and the pain is sharp.  Pain in the left shoulder can be as low as a 5 and is high as an 8 after she has been using her arm.  Patient also with some continued discomfort at the initial site of the insect bite.  Patient also  states that she feels that she may have developed a yeast infection due to the antibiotic she received during her hospitalization.  Patient states that her headaches have been frontal and at the back of her scalp/upper neck.  Patient reports that in the past she took Fioricet which did seem to relieve her headaches as patient states that the medication that she has been taking, over-the-counter Excedrin has not been relieving her headaches but has slightly reduce the headaches.  Headaches are about a 5 on a 0-to-10 scale.  Patient also reports that she has some nausea with her headaches and would like medication to take to help with the nausea.  Patient states that she has been out of work for several weeks now.      Patient reports that she had recent contact with a sick grandchild who had ear pain and a runny nose and patient now with complaint of ear pain which is a dull aching sensation but occasionally sharp as well as nasal congestion and clear nasal discharge and sensation of postnasal drainage.  Patient denies any fever or chills.  Patient has had occasional, infrequent cough.  Cough is nonproductive.  Patient states that the congestion has been present for about a week with onset of ear pain 2 days ago.      Patient reports no known drug allergies.  Patient does smoke about  half pack per day of cigarettes.  Patient is not interested in smoking cessation at this time. Past Medical History:  Diagnosis Date  . Abdominal pain, right lower quadrant   . Anxiety   . Depression   . Fibromyalgia   . GERD (gastroesophageal reflux disease)   . Migraine   . Obesity   . Peripheral vascular disease (HCC)    RIGHT LEG  . PONV (postoperative nausea and vomiting)   . Stroke ([place])   . Trichomonas 2012   Past Surgical History:  Procedure Laterality Date  . CESAREAN SECTION    . FRACTURE SURGERY    . HYSTEROSCOPY WITH NOVASURE N/A 08/08/2014   Procedure:  NOVASURE;  Surgeon: Emily Filbert, MD;  Location: [facility];  Service: Gynecology;  Laterality: N/A;  . VEIN SURGERY     Family History  Problem Relation Age of Onset  . Diabetes Mother   . Hypertension Mother   . Diabetes Father   . Hypertension Father   . Diabetes Other   . Hypertension Other   . Other Neg Hx    Social History   Tobacco Use  . Smoking status: Current Every Day Smoker    Packs/day: 1.00    Years: 5.00    Pack years: 5.00    Types: Cigarettes  . Smokeless tobacco: Never Used  Substance Use Topics  . Alcohol use: No  . Drug use: No  No Known Allergies   Review of Systems  Constitutional: Positive for fatigue. Negative for chills and fever.  HENT: Positive for congestion, ear pain, postnasal drip, rhinorrhea and sinus pressure. Negative for sinus pain, sore throat and trouble swallowing.   Respiratory: Positive for cough. Negative for shortness of breath.   Cardiovascular: Negative for chest pain, palpitations and leg swelling.  Gastrointestinal: Negative for abdominal pain and nausea.  Genitourinary: Negative for dysuria and frequency.  Musculoskeletal: Positive for arthralgias and joint swelling. Negative for back pain.  Neurological: Positive for headaches. Negative for dizziness and weakness.       Objective:   Physical Exam BP 120/80 (BP Location: Left Arm, Patient Position: Sitting, Cuff Size: Normal)   Pulse 79   Temp 98.3 F (36.8 C) (Oral)   Resp 18   Ht 5\' 3"  (1.6 m)   Wt 183 lb (83 kg)   SpO2 98%   BMI 32.42 kg/m Vital signs and nurse's note reviewed General- well-nourished well-developed older female in no acute distress.  Patient does have a nasal quality to her voice at today's visit. ENT- TMs are pink bilaterally, nares with moderate edema of the nasal turbinates with clear to white nasal discharge, patient with posterior pharynx erythema, no exudate.  No reproducible tenderness over the frontal or maxillary sinuses. Neck-supple, no lymphadenopathy, patient with some mild discomfort and  spasm of the posterior cervical paraspinous muscles where they attach at the posterior occipital ridge Cardiovascular- regular rate and rhythm Lungs-clear to auscultation bilaterally Abdomen-soft, nontender Back-no CVA tenderness Musculoskeletal- patient does not have any reproducible shoulder discomfort on exam but does have some mild decrease in range of motion secondary to complaint of pain with attempt to lift her arm above horizontal at the left shoulder. Skin- patient has a raised nodule on the dorsum of the left arm just below the elbow.  Nodule is scabbed but patient with increased warmth of the surrounding skin. Neuro-cranial nerves II through XII are grossly intact       Assessment & Plan:  1. URI with cough  and congestion Patient with URI with cough and congestion but also with otitis media on exam.  Prescription provided for cetirizine to take at bedtime as needed for nasal congestion and patient may take over-the-counter cold medications as needed. - cetirizine (ZYRTEC) 10 MG tablet; Take 1 tablet (10 mg total) by mouth at bedtime. To help with congestion  Dispense: 30 tablet; Refill: 1  2. Acute otitis media, unspecified otitis media type Patient with otitis media but has also had recent hospitalization for infected wound that was positive for MRSA.  Patient will be placed on doxycycline - doxycycline (VIBRA-TABS) 100 MG tablet; Take 1 tablet (100 mg total) by mouth 2 (two) times daily.  Dispense: 20 tablet; Refill: 0  3. Infected wound Patient's notes from her urgent care and emergency department visits as well as hospitalization were reviewed.  Patient had admission with cellulitis of the left arm status post I&D of an abscess.  Patient had wound culture positive for MRSA.  Patient was treated with IV vancomycin then transitioned to Septra DS and Bactroban.  Patient is encouraged to return to the emergency department if she has increased pain and swelling in her arm or any  concerns.  Otherwise patient should return to clinic in 1 week for reevaluation.  Patient did not wish to have blood work today but at follow-up, CBC and BMP needed in follow-up of recent hospitalization - doxycycline (VIBRA-TABS) 100 MG tablet; Take 1 tablet (100 mg total) by mouth 2 (two) times daily.  Dispense: 20 tablet; Refill: 0  4. Vaginal itching Patient with complaint of vaginal itching.  Urine sample was obtained for urine cytology and prescription provided for Diflucan.  Patient was also discharged from the hospital with clotrimazole cream - Urine cytology ancillary only - fluconazole (DIFLUCAN) 150 MG tablet; Take 1 tablet (150 mg total) by mouth once for 1 dose.  Dispense: 1 tablet; Refill: 0  5. Nonintractable episodic headache, unspecified headache type Patient with complaint of recurrent headaches as well as nausea status post hospital discharge with treatment of MRSA wound infection.  Patient's blood cultures were negative.  Patient was given prescription for Fioricet and Zofran as patient states that Fioricet has helped with the past headaches. - butalbital-acetaminophen-caffeine (FIORICET, ESGIC) 50-325-40 MG tablet; Take 1 tablet by mouth every 6 (six) hours as needed for headache.  Dispense: 30 tablet; Refill: 1 - ondansetron (ZOFRAN) 4 MG tablet; Take 1 tablet (4 mg total) by mouth every 8 (eight) hours as needed for nausea or vomiting.  Dispense: 20 tablet; Refill: 0  6. Acute pain of left shoulder Patient with complaint of continued pain in the left shoulder status post recent arm swelling secondary to infection/cellulitis.  Patient is provided with prescription for ibuprofen 600 mg to take 1 every 8 hours as needed for moderate pain.  Patient was advised that she needed to take the medication after eating to help avoid GI upset and decrease risk for GI bleed - ibuprofen (ADVIL,MOTRIN) 600 MG tablet; Take 1 tablet (600 mg total) by mouth every 8 (eight) hours as needed for  moderate pain. Take after eating  Dispense: 60 tablet; Refill: 0  7. Need for immunization against influenza Patient was offered and agreed to have influenza immunization at today's visit - Flu Vaccine QUAD 36+ mos IM  *At the end of the visit, patient requested through [facility] [facility] to have a note allowing her to return to work this Monday.  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  Patient was told that note would be provided for her to return to work to her full-time job on Monday without restrictions however if she has worsening pain/redness or any concerns, she should go to the emergency department or return to the clinic for further evaluation  An After Visit Summary was printed and given to the patient.  Return in about 1 week (around 04/22/2018) for wound left arm; 1-2 weeks.

## 2018-04-16 ENCOUNTER — Encounter: Payer: Self-pay | Admitting: Family Medicine

## 2018-04-16 LAB — URINE CYTOLOGY ANCILLARY ONLY
Chlamydia: NEGATIVE
Neisseria Gonorrhea: NEGATIVE
Trichomonas: NEGATIVE

## 2018-04-17 LAB — URINE CYTOLOGY ANCILLARY ONLY
Bacterial vaginitis: NEGATIVE
Candida vaginitis: NEGATIVE

## 2018-04-20 ENCOUNTER — Telehealth: Payer: Self-pay

## 2018-04-20 NOTE — Telephone Encounter (Signed)
Called pt to give results of negative BV and other tests and pt stopped me and said she already knows all about all the results from using MY Chart.  I asked if she had any questions for Dr Chapman Fitch and she said she did not and that she would see [place] at her next appt on 04/27/18 and she said good bye.

## 2018-04-22 ENCOUNTER — Telehealth: Payer: Self-pay | Admitting: *Deleted

## 2018-04-22 NOTE — Telephone Encounter (Signed)
-----   Message from Antony Blackbird, MD sent at 04/16/2018 10:53 PM EDT ----- Notify patient that her urine cytology test was negative for chlamydia, gonorrhea and trichomonas

## 2018-04-22 NOTE — Telephone Encounter (Signed)
Medical Assistant left message on patient's home and cell voicemail. Voicemail states to give a call back to [place] with [facility] at [phone]. Please inform patient of no STD's being noted.

## 2018-04-27 ENCOUNTER — Ambulatory Visit: Payer: Self-pay | Admitting: Family Medicine

## 2018-05-04 ENCOUNTER — Ambulatory Visit: Payer: Self-pay | Admitting: Family Medicine

## 2018-06-01 ENCOUNTER — Encounter: Payer: Self-pay | Admitting: Family Medicine

## 2018-06-01 ENCOUNTER — Ambulatory Visit: Payer: BLUE CROSS/BLUE SHIELD | Attending: Family Medicine | Admitting: Family Medicine

## 2018-06-01 VITALS — BP 112/78 | HR 78 | Temp 98.2°F | Resp 18 | Ht 63.0 in | Wt 186.0 lb

## 2018-06-01 DIAGNOSIS — Z8249 Family history of ischemic heart disease and other diseases of the circulatory system: Secondary | ICD-10-CM | POA: Insufficient documentation

## 2018-06-01 DIAGNOSIS — G8929 Other chronic pain: Secondary | ICD-10-CM

## 2018-06-01 DIAGNOSIS — M797 Fibromyalgia: Secondary | ICD-10-CM | POA: Insufficient documentation

## 2018-06-01 DIAGNOSIS — R300 Dysuria: Secondary | ICD-10-CM | POA: Diagnosis not present

## 2018-06-01 DIAGNOSIS — F419 Anxiety disorder, unspecified: Secondary | ICD-10-CM | POA: Insufficient documentation

## 2018-06-01 DIAGNOSIS — K219 Gastro-esophageal reflux disease without esophagitis: Secondary | ICD-10-CM

## 2018-06-01 DIAGNOSIS — Z9889 Other specified postprocedural states: Secondary | ICD-10-CM | POA: Insufficient documentation

## 2018-06-01 DIAGNOSIS — Z791 Long term (current) use of non-steroidal anti-inflammatories (NSAID): Secondary | ICD-10-CM

## 2018-06-01 DIAGNOSIS — R319 Hematuria, unspecified: Secondary | ICD-10-CM | POA: Diagnosis not present

## 2018-06-01 DIAGNOSIS — M25522 Pain in left elbow: Secondary | ICD-10-CM | POA: Diagnosis not present

## 2018-06-01 DIAGNOSIS — M25512 Pain in left shoulder: Secondary | ICD-10-CM

## 2018-06-01 DIAGNOSIS — Z8673 Personal history of transient ischemic attack (TIA), and cerebral infarction without residual deficits: Secondary | ICD-10-CM | POA: Insufficient documentation

## 2018-06-01 DIAGNOSIS — N644 Mastodynia: Secondary | ICD-10-CM

## 2018-06-01 DIAGNOSIS — Z833 Family history of diabetes mellitus: Secondary | ICD-10-CM | POA: Insufficient documentation

## 2018-06-01 DIAGNOSIS — Z803 Family history of malignant neoplasm of breast: Secondary | ICD-10-CM | POA: Insufficient documentation

## 2018-06-01 DIAGNOSIS — I739 Peripheral vascular disease, unspecified: Secondary | ICD-10-CM | POA: Insufficient documentation

## 2018-06-01 DIAGNOSIS — F17210 Nicotine dependence, cigarettes, uncomplicated: Secondary | ICD-10-CM | POA: Insufficient documentation

## 2018-06-01 DIAGNOSIS — R0789 Other chest pain: Secondary | ICD-10-CM | POA: Insufficient documentation

## 2018-06-01 LAB — POCT URINALYSIS DIP (CLINITEK)
Bilirubin, UA: NEGATIVE
Glucose, UA: NEGATIVE mg/dL
Ketones, POC UA: NEGATIVE mg/dL
Leukocytes, UA: NEGATIVE
Nitrite, UA: NEGATIVE
POC PROTEIN,UA: NEGATIVE
Spec Grav, UA: 1.025
Urobilinogen, UA: 1 U/dL
pH, UA: 5.5

## 2018-06-01 MED ORDER — OMEPRAZOLE 40 MG PO CPDR: 40.0000 mg | DELAYED_RELEASE_CAPSULE | Freq: Every day | ORAL | 3 refills | Status: DC

## 2018-06-01 NOTE — Patient Instructions (Signed)
Breast Tenderness Breast tenderness is a common problem for women of all ages. Breast tenderness may cause mild discomfort to severe pain. The pain usually comes and goes in association with your menstrual cycle, but it can be constant. Breast tenderness has many possible causes, including hormone changes and some medicines. Your health care provider may order tests, such as a mammogram or an ultrasound, to check for any unusual findings. Having breast tenderness usually does not mean that you have breast cancer. Follow these instructions at home: Sometimes, reassurance that you do not have breast cancer is all that is needed. In general, follow these home care instructions: Managing pain and discomfort  If directed, apply ice to the area: ? Put ice in a plastic bag. ? Place a towel between your skin and the bag. ? Leave the ice on for 20 minutes, 2-3 times a day.  Make sure you are wearing a supportive bra, especially during exercise. You may also want to wear a supportive bra while sleeping if your breasts are very tender. Medicines  Take over-the-counter and prescription medicines only as told by your health care provider. If the cause of your pain is infection, you may be prescribed an antibiotic medicine.  If you were prescribed an antibiotic, take it as told by your health care provider. Do not stop taking the antibiotic even if you start to feel better. General instructions  Your health care provider may recommend that you reduce the amount of fat in your diet. You can do this by: ? Limiting fried foods. ? Cooking foods using methods, such as baking, boiling, grilling, and broiling.  Decrease the amount of caffeine in your diet. You can do this by drinking more water and choosing caffeine-free options.  Keep a log of the days and times when your breasts are most tender.  Ask your health care provider how to do breast exams at home. This will help you notice if you have an unusual  growth or lump. Contact a health care provider if:  Any part of your breast is hard, red, and hot to the touch. This may be a sign of infection.  You are not breastfeeding and you have fluid, especially blood or pus, coming out of your nipples.  You have a fever.  You have a new or painful lump in your breast that remains after your menstrual period ends.  Your pain does not improve or it gets worse.  Your pain is interfering with your daily activities. This information is not intended to replace advice given to you by your health care provider. Make sure you discuss any questions you have with your health care provider. Document Released: 06/13/2008 Document Revised: 03/29/2016 Document Reviewed: 03/29/2016 Elsevier Interactive Patient Education  2018 [employer] for Gastroesophageal Reflux Disease, Adult When you have gastroesophageal reflux disease (GERD), the foods you eat and your eating habits are very important. Choosing the right foods can help ease the discomfort of GERD. Consider working with a diet and nutrition specialist (dietitian) to help you make healthy food choices. What general guidelines should I follow? Eating plan  Choose healthy foods low in fat, such as fruits, vegetables, whole grains, low-fat dairy products, and lean meat, fish, and poultry.  Eat frequent, small meals instead of three large meals each day. Eat your meals slowly, in a relaxed setting. Avoid bending over or lying down until 2-3 hours after eating.  Limit high-fat foods such as fatty meats or fried foods.  Limit your intake of oils, butter, and shortening to less than 8 teaspoons each day.  Avoid the following: ? Foods that cause symptoms. These may be different for different people. Keep a food diary to keep track of foods that cause symptoms. ? Alcohol. ? Drinking large amounts of liquid with meals. ? Eating meals during the 2-3 hours before bed.  Cook foods using methods  other than frying. This may include baking, grilling, or broiling. Lifestyle   Maintain a healthy weight. Ask your health care provider what weight is healthy for you. If you need to lose weight, work with your health care provider to do so safely.  Exercise for at least 30 minutes on 5 or more days each week, or as told by your health care provider.  Avoid wearing clothes that fit tightly around your waist and chest.  Do not use any products that contain nicotine or tobacco, such as cigarettes and e-cigarettes. If you need help quitting, ask your health care provider.  Sleep with the head of your bed raised. Use a wedge under the mattress or blocks under the bed frame to raise the head of the bed. What foods are not recommended? The items listed may not be a complete list. Talk with your dietitian about what dietary choices are best for you. Grains Pastries or quick breads with added fat. [place] toast. Vegetables Deep fried vegetables. [place] fries. Any vegetables prepared with added fat. Any vegetables that cause symptoms. For some people this may include tomatoes and tomato products, chili peppers, onions and garlic, and horseradish. Fruits Any fruits prepared with added fat. Any fruits that cause symptoms. For some people this may include citrus fruits, such as oranges, grapefruit, pineapple, and lemons. Meats and other protein foods High-fat meats, such as fatty beef or pork, hot dogs, ribs, ham, sausage, salami and bacon. Fried meat or protein, including fried fish and fried chicken. Nuts and nut butters. Dairy Whole milk and chocolate milk. Sour cream. Cream. Ice cream. Cream cheese. Milk shakes. Beverages Coffee and tea, with or without caffeine. Carbonated beverages. Sodas. Energy drinks. Fruit juice made with acidic fruits (such as orange or grapefruit). Tomato juice. Alcoholic drinks. Fats and oils Butter. Margarine. Shortening. Ghee. Sweets and desserts Chocolate and cocoa.  Donuts. Seasoning and other foods Pepper. Peppermint and spearmint. Any condiments, herbs, or seasonings that cause symptoms. For some people, this may include curry, hot sauce, or vinegar-based salad dressings. Summary  When you have gastroesophageal reflux disease (GERD), food and lifestyle choices are very important to help ease the discomfort of GERD.  Eat frequent, small meals instead of three large meals each day. Eat your meals slowly, in a relaxed setting. Avoid bending over or lying down until 2-3 hours after eating.  Limit high-fat foods such as fatty meat or fried foods. This information is not intended to replace advice given to you by your health care provider. Make sure you discuss any questions you have with your health care provider. Document Released: 07/01/2005 Document Revised: 07/02/2016 Document Reviewed: 07/02/2016 Elsevier Interactive Patient Education  [employer].

## 2018-06-01 NOTE — Progress Notes (Signed)
Subjective:    Patient ID: Caitlin Valdez, female    DOB: [birth year], 47 y.o.   MRN: [id number]  HPI       47 yo female seen in follow-up of left elbow pain status post hospitalization 03/17/2018 through 03/21/2018 for abscess and cellulitis of the left arm.  Patient reports that she continues to have pain at the underside of her elbow at the area where I&D occurred.  Patient states that she was bitten by a spider which caused the initial abscess.  Patient had originally gone to the emergency department on 03/06/2018 with initial symptoms.        At today's visit patient with complaint of recent onset of dysuria.  Patient reports discomfort/burning with urination.  Patient denies any pelvic or vaginal pain and no vaginal discharge.  Patient wonders if she has a dysuria secondary to antibiotic use. (I last saw patient in the office on 04/15/2018 and prescribed antibiotics at that time).  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  Patient with complaint of continued pain in her left posterior elbow at the site of her prior infection site.  Patient feels that the skin/area of her prior infection is warm to touch.  Patient states that she has recurrent sharp, shooting pain in this area that is about a 5 on a 0-to-10 scale.  Patient also with continued discomfort/pain in her left shoulder that is about a 7 on a 0-to-10 scale.  Patient also with pain in her elbow that is about a 7 on a 0-to-10 scale.  Patient reports that the pain in her elbow and shoulder is a dull, aching pain.  Patient states that she is right-handed.  Patient states that she has been taking ibuprofen that was previously prescribed.  Patient however has now started having some substernal chest discomfort along with burping/belching and backwash of bad tasting fluid into her mouth and throat and she believes she is having acid reflux symptoms.      Patient also reports that she has felt a  mass in her left breast that has been tender.  Patient states that she has felt this area for over a month and she believes that the mass is bigger over time.  Patient states that she would like to be set up for mammogram as soon as possible as she reports that her mother also has had breast cancer and was diagnosed about 2 years ago and may have been 50 at the time.  Patient denies any skin changes, no axillary adenopathy and no nipple discharge. Past Medical History:  Diagnosis Date  . Abdominal pain, right lower quadrant   . Anxiety   . Depression   . Fibromyalgia   . GERD (gastroesophageal reflux disease)   . Migraine   . Obesity   . Peripheral vascular disease (HCC)    RIGHT LEG  . PONV (postoperative nausea and vomiting)   . Stroke ([place])   . Trichomonas 2012   Past Surgical History:  Procedure Laterality Date  . CESAREAN SECTION    . FRACTURE SURGERY    . HYSTEROSCOPY WITH NOVASURE N/A 08/08/2014   Procedure:  NOVASURE;  Surgeon: Emily Filbert, MD;  Location: [facility];  Service: Gynecology;  Laterality: N/A;  . VEIN SURGERY    . Family History  Problem Relation Age of Onset  . Diabetes Mother   . Hypertension Mother   . Diabetes Father   . Hypertension Father   .  Diabetes Other   . Hypertension Other   . Other Neg Hx    Social History   Tobacco Use  . Smoking status: Current Every Day Smoker    Packs/day: 1.00    Years: 5.00    Pack years: 5.00    Types: Cigarettes  . Smokeless tobacco: Never Used  Substance Use Topics  . Alcohol use: No  . Drug use: No  No Known Allergies        Review of Systems  Constitutional: Positive for fatigue. Negative for chills and fever.  HENT: Negative for sore throat and trouble swallowing.   Respiratory: Negative for cough and shortness of breath.   Cardiovascular: Negative for chest pain, palpitations and leg swelling.  Gastrointestinal: Negative for abdominal pain and nausea.  Genitourinary: Positive for dysuria. Negative for  flank pain and frequency.  Musculoskeletal: Positive for arthralgias and myalgias. Negative for back pain and gait problem.  Neurological: Negative for dizziness and headaches.  Hematological: Negative for adenopathy. Does not bruise/bleed easily.       Objective:   Physical Exam Ht 5\' 3"  (1.6 m)   Wt 186 lb (84.4 kg)   BMI 32.95 kg/m   General-well-nourished, well-developed overweight for height female in no acute distress sitting on exam table Neck-supple, no lymphadenopathy, borderline thyromegaly, no carotid bruit Lungs-clear to auscultation bilaterally Cardiovascular-regular rate and rhythm Abdomen- mild truncal obesity, soft and nontender Breast exam- no right-sided axillary adenopathy, no skin changes, no palpable abnormalities and no tenderness to palpation.  Left breast-no axillary adenopathy, no skin changes, no nipple discharge, patient with an area of increased tissue density in the left upper outer breast approximately 2 o'clock position and patient with complaint of tenderness to palpation at approximately 10:00 in the left medial breast and patient with area of mild increased tissue density Back-no CVA tenderness Musculoskeletal- patient with tenderness at the left medial epicondyle of the elbow.  Patient with some discomfort to palpation on the dorsum of the elbow at the area of I&D which is now healed scar.  Patient does not have any increased warmth, edema or erythema at this area.  Patient with some tenderness at the lateral anterior and posterior left shoulder and patient with complaint of discomfort in the left shoulder with empty can sign maneuver Psych- patient appears slightly anxious at times Extremities-no edema     Assessment & Plan:  1. Dysuria Patient with complaint of dysuria.  Patient had urinalysis to look for possible urinary tract infection as a cause of her dysuria.  Urinalysis was abnormal and showed presence of hematuria.  Patient's urine will be sent  for culture and patient will be notified if further treatment is needed based on the culture results.  Patient will also have urine cytology as patient reports history of recurrent BV.  Discussed with patient that she may want to only use a pH balanced cleanser that is designed for the vaginal area and avoid any douching to help reduce her risk of BV. - POCT URINALYSIS DIP (CLINITEK) - Urine cytology ancillary only - Urine Culture  2. Left elbow pain Patient with complaint of left elbow pain which she believes is secondary to prior infection which required I&D and patient also subsequently developed cellulitis of the left arm for which she had to be hospitalized.  On today's exam, there is no reproducible pain at the site of prior I&D but patient does have tenderness at the medial epicondyle.  Patient is being referred to orthopedics for further  evaluation and treatment. - Ambulatory referral to Orthopedic Surgery  3. Chronic left shoulder pain Patient with complaint of left shoulder pain which she believes has been present since August when she had cellulitis of her arm.  On exam, patient symptoms or more so consistent with rotator cuff tendinopathy/tendinitis for which she will be referred to orthopedics for further evaluation and treatment.  Patient has been asked to stop the use of ibuprofen 600 milligrams secondary to issues with acid reflux - Ambulatory referral to Orthopedic Surgery  4. Breast tenderness Patient with complaint of breast tenderness on exam and patient also feels as if she has felt a palpable mass.  Patient will be referred for diagnostic mammogram and ultrasound. - MM Digital Diagnostic Bilat; Future - US BREAST LTD UNI LEFT INC AXILLA; Future  5. Gastroesophageal reflux disease, esophagitis presence not specified Patient with complaint of acid reflux type symptoms.  Patient has been asked to discontinue the use of ibuprofen.  Patient may take OTC Tylenol as needed for pain.   Prescription provided for omeprazole 40 mg daily.  Patient should avoid any known trigger foods as well as avoidance of late night eating.  Patient also given information on GERD diet as part of AVS. - omeprazole (PRILOSEC) 40 MG capsule; Take 1 capsule (40 mg total) by mouth daily.  Dispense: 30 capsule; Refill: 3  6. NSAID long-term use Patient has been on ibuprofen 600 mg or OTC ibuprofen for several months secondary to infection at the left elbow followed by development of cellulitis.  Patient has been asked to stop the use of ibuprofen which is likely cause some gastritis or reflux type symptoms.  Patient may take Tylenol as needed for pain.  Patient is being placed on omeprazole and will be reevaluated to see if further intervention or referral to GI is needed based on her symptoms. - omeprazole (PRILOSEC) 40 MG capsule; Take 1 capsule (40 mg total) by mouth daily.  Dispense: 30 capsule; Refill: 3  7. Hematuria, unspecified type Patient with hematuria on urinalysis done at today's visit.  Urine will be sent for culture and patient will be notified if further treatment is needed based on the culture results.  Patient has been asked to return to clinic in a few weeks at which time she will have repeat urinalysis to make sure that hematuria has resolved.  If hematuria does not resolve, patient will need further evaluation by urology. -Urine culture  An After Visit Summary was printed and given to the patient.  Return in about 3 weeks (around 06/22/2018) for hematuria/GERD.

## 2018-06-02 LAB — URINE CYTOLOGY ANCILLARY ONLY
Chlamydia: NEGATIVE
Neisseria Gonorrhea: NEGATIVE
Trichomonas: NEGATIVE

## 2018-06-03 LAB — URINE CYTOLOGY ANCILLARY ONLY
Bacterial vaginitis: NEGATIVE
Candida vaginitis: NEGATIVE

## 2018-06-04 ENCOUNTER — Telehealth: Payer: Self-pay | Admitting: *Deleted

## 2018-06-04 NOTE — Telephone Encounter (Signed)
Medical Assistant left message on patient's home and cell voicemail. Voicemail states to give a call back to [place] with [facility] at [phone]. Patient is aware of all labs being normal and negative.

## 2018-06-04 NOTE — Telephone Encounter (Signed)
-----   Message from Antony Blackbird, MD sent at 06/03/2018  8:03 PM EST ----- Please notify patient that her urine cytology was negative for bacterial vaginitis and yeast

## 2018-06-09 ENCOUNTER — Ambulatory Visit
Admission: RE | Admit: 2018-06-09 | Discharge: 2018-06-09 | Disposition: A | Discharge status: Home / Self Care | Payer: BLUE CROSS/BLUE SHIELD | Source: Ambulatory Visit | Attending: Family Medicine | Admitting: Family Medicine

## 2018-06-09 DIAGNOSIS — N644 Mastodynia: Secondary | ICD-10-CM

## 2018-09-28 ENCOUNTER — Ambulatory Visit: Payer: BLUE CROSS/BLUE SHIELD | Attending: Family Medicine | Admitting: Family Medicine

## 2018-09-28 ENCOUNTER — Other Ambulatory Visit: Payer: Self-pay

## 2018-09-28 ENCOUNTER — Encounter: Payer: Self-pay | Admitting: Family Medicine

## 2018-09-28 VITALS — BP 132/83 | HR 63 | Temp 98.2°F | Resp 18 | Ht 64.0 in | Wt 167.8 lb

## 2018-09-28 DIAGNOSIS — R05 Cough: Secondary | ICD-10-CM | POA: Diagnosis not present

## 2018-09-28 DIAGNOSIS — B9689 Other specified bacterial agents as the cause of diseases classified elsewhere: Secondary | ICD-10-CM

## 2018-09-28 DIAGNOSIS — R51 Headache: Secondary | ICD-10-CM | POA: Diagnosis not present

## 2018-09-28 DIAGNOSIS — J069 Acute upper respiratory infection, unspecified: Secondary | ICD-10-CM

## 2018-09-28 DIAGNOSIS — R6889 Other general symptoms and signs: Secondary | ICD-10-CM

## 2018-09-28 DIAGNOSIS — R519 Headache, unspecified: Secondary | ICD-10-CM

## 2018-09-28 DIAGNOSIS — M791 Myalgia, unspecified site: Secondary | ICD-10-CM

## 2018-09-28 DIAGNOSIS — H9202 Otalgia, left ear: Secondary | ICD-10-CM | POA: Diagnosis not present

## 2018-09-28 DIAGNOSIS — N760 Acute vaginitis: Secondary | ICD-10-CM

## 2018-09-28 LAB — POCT INFLUENZA A/B
Influenza A, POC: NEGATIVE
Influenza B, POC: NEGATIVE

## 2018-09-28 MED ORDER — BUTALBITAL-APAP-CAFFEINE 50-325-40 MG PO TABS: 1.0000 | ORAL_TABLET | Freq: Four times a day (QID) | ORAL | 1 refills | Status: DC | PRN

## 2018-09-28 MED ORDER — CETIRIZINE HCL 10 MG PO TABS: 10.0000 mg | ORAL_TABLET | Freq: Every day | ORAL | 1 refills | Status: DC

## 2018-09-28 MED ORDER — METRONIDAZOLE 500 MG PO TABS: 500.0000 mg | ORAL_TABLET | Freq: Two times a day (BID) | ORAL | 0 refills | Status: DC

## 2018-09-28 NOTE — Progress Notes (Signed)
Patient arrived ambulatory, alert and orientated to clinic.  Patient is in clinicwith a complaint of flu like symptoms.  Patient complains generalized body aches, headaches for the last four (4) days.  Patient endorses weakness, nausea, chills and headaches.  Pain 8/10 located on the left side of her head and in bilateral legs.

## 2018-09-28 NOTE — Progress Notes (Signed)
Subjective:  Patient ID: Caitlin Valdez, female    DOB: 1970/10/23  Age: 48 y.o. MRN: [id number]  CC: Generalized Body Aches   HPI Caitlin Valdez presents with a four-day history of generalized body aches, nausea, chills, cough productive of minimal sputum, left ear pain, headache.  Complains "my face hurts", "I do not feel good".  She works at [employer] and states she had a customer cough on her. She denies dyspnea, fever or history of recent travels. She has left-sided headache and left earache. Also complains of vaginal discharge which is typical of bacterial vaginosis and is requesting treatment for this. Also requests Fioricet refill for treatment of her migraine.  Past Medical History:  Diagnosis Date  . Abdominal pain, right lower quadrant   . Anxiety   . Depression   . Fibromyalgia   . GERD (gastroesophageal reflux disease)   . Migraine   . Obesity   . Peripheral vascular disease (HCC)    RIGHT LEG  . PONV (postoperative nausea and vomiting)   . Stroke ([place])   . Trichomonas 2012    Past Surgical History:  Procedure Laterality Date  . CESAREAN SECTION    . FRACTURE SURGERY    . HYSTEROSCOPY WITH NOVASURE N/A 08/08/2014   Procedure:  NOVASURE;  Surgeon: Emily Filbert, MD;  Location: [facility];  Service: Gynecology;  Laterality: N/A;  . VEIN SURGERY      Family History  Problem Relation Age of Onset  . Diabetes Mother   . Hypertension Mother   . Breast cancer Mother 48  . Diabetes Father   . Hypertension Father   . Diabetes Other   . Hypertension Other   . Other Neg Hx     No Known Allergies  Outpatient Medications Prior to Visit  Medication Sig Dispense Refill  . aspirin-acetaminophen-caffeine (EXCEDRIN MIGRAINE) 250-250-65 MG tablet Take 2 tablets by mouth every 6 (six) hours as needed for headache.    . bisacodyl (DULCOLAX) 10 MG suppository Place 1 suppository (10 mg total) rectally daily as needed for moderate constipation. 12 suppository 0  .  butalbital-acetaminophen-caffeine (FIORICET, ESGIC) 50-325-40 MG tablet Take 1 tablet by mouth every 6 (six) hours as needed for headache. 30 tablet 1  . ibuprofen (ADVIL,MOTRIN) 600 MG tablet Take 1 tablet (600 mg total) by mouth every 8 (eight) hours as needed for moderate pain. Take after eating 60 tablet 0  . mupirocin ointment (BACTROBAN) 2 % Apply 1 application topically 2 (two) times daily. 22 g 0  . omeprazole (PRILOSEC) 40 MG capsule Take 1 capsule (40 mg total) by mouth daily. 30 capsule 3  . ondansetron (ZOFRAN) 4 MG tablet Take 1 tablet (4 mg total) by mouth every 8 (eight) hours as needed for nausea or vomiting. 20 tablet 0  . cetirizine (ZYRTEC) 10 MG tablet Take 1 tablet (10 mg total) by mouth at bedtime. To help with congestion 30 tablet 1  . bacitracin ointment Apply topically 2 (two) times daily. (Patient not taking: Reported on 06/01/2018) 120 g 0  . doxycycline (VIBRA-TABS) 100 MG tablet Take 1 tablet (100 mg total) by mouth 2 (two) times daily. (Patient not taking: Reported on 06/01/2018) 20 tablet 0  . meloxicam (MOBIC) 7.5 MG tablet Take 1 tablet (7.5 mg total) by mouth daily. (Patient not taking: Reported on 09/28/2018) 15 tablet 0  . polyethylene glycol (MIRALAX / GLYCOLAX) packet Take 17 g by mouth 2 (two) times daily as needed for mild constipation. (Patient not taking:  Reported on 09/28/2018) 14 each 0   No facility-administered medications prior to visit.      ROS Review of Systems  Constitutional: Positive for chills. Negative for activity change, appetite change, fatigue and fever.  HENT: Positive for sinus pain. Negative for congestion, sinus pressure and sore throat.   Eyes: Negative for visual disturbance.  Respiratory: Positive for cough. Negative for chest tightness, shortness of breath and wheezing.   Cardiovascular: Negative for chest pain and palpitations.  Gastrointestinal: Positive for nausea. Negative for abdominal distention, abdominal pain and  constipation.  Endocrine: Negative for polydipsia.  Genitourinary: Positive for vaginal discharge. Negative for dysuria and frequency.  Musculoskeletal: Positive for myalgias. Negative for arthralgias and back pain.  Skin: Negative for rash.  Neurological: Positive for headaches. Negative for tremors, light-headedness and numbness.  Hematological: Does not bruise/bleed easily.  Psychiatric/Behavioral: Negative for agitation and behavioral problems.    Objective:  BP 132/83 (BP Location: Left Arm, Patient Position: Sitting, Cuff Size: Normal)   Pulse 63   Temp 98.2 F (36.8 C) (Oral)   Resp 18   Ht 5\' 4"  (1.626 m)   Wt 167 lb 12.8 oz (76.1 kg)   SpO2 100%   BMI 28.80 kg/m   BP/Weight 09/28/2018 06/01/2018 34/07/9377  Systolic BP 024 097 353  Diastolic BP 83 78 80  Wt. (Lbs) 167.8 186 183  BMI 28.8 32.95 32.42      Physical Exam Constitutional:      Appearance: She is well-developed.  HENT:     Head:     Comments: Tenderness on L otoscopy    Right Ear: Tympanic membrane normal.     Left Ear: Tympanic membrane normal.     Mouth/Throat:     Pharynx: Oropharynx is clear.  Cardiovascular:     Rate and Rhythm: Normal rate.     Heart sounds: Normal heart sounds. No murmur.  Pulmonary:     Effort: Pulmonary effort is normal.     Breath sounds: Normal breath sounds. No wheezing or rales.  Chest:     Chest wall: No tenderness.  Abdominal:     General: Bowel sounds are normal. There is no distension.     Palpations: Abdomen is soft. There is no mass.     Tenderness: There is no abdominal tenderness.  Musculoskeletal: Normal range of motion.  Neurological:     Mental Status: She is alert and oriented to person, place, and time.     CMP Latest Ref Rng & Units 04/02/2018 03/21/2018 03/20/2018  Glucose 70 - 99 mg/dL 81 - -  BUN 6 - 20 mg/dL 9 - -  Creatinine 0.44 - 1.00 mg/dL 0.87 0.76 0.81  Sodium 135 - 145 mmol/L 139 - -  Potassium 3.5 - 5.1 mmol/L 4.1 - -  Chloride 98 -  111 mmol/L 109 - -  CO2 22 - 32 mmol/L 20(L) - -  Calcium 8.9 - 10.3 mg/dL 9.5 - -  Total Protein 6.5 - 8.1 g/dL 7.5 - -  Total Bilirubin 0.3 - 1.2 mg/dL 0.6 - -  Alkaline Phos 38 - 126 U/L 59 - -  AST 15 - 41 U/L 16 - -  ALT 0 - 44 U/L 19 - -    Lipid Panel     Component Value Date/Time   CHOL 181 12/23/2013 1720   TRIG 103 12/23/2013 1720   HDL 75 12/23/2013 1720   CHOLHDL 2.4 12/23/2013 1720   VLDL 21 12/23/2013 1720   LDLCALC 85 12/23/2013  1720    CBC    Component Value Date/Time   WBC 7.3 04/02/2018 2137   RBC 4.30 04/02/2018 2137   HGB 14.2 04/02/2018 2137   HCT 41.0 04/02/2018 2137   PLT 290 04/02/2018 2137   MCV 95.3 04/02/2018 2137   MCH 33.0 04/02/2018 2137   MCHC 34.6 04/02/2018 2137   RDW 12.2 04/02/2018 2137   LYMPHSABS 4.3 (H) 04/02/2018 2137   MONOABS 0.6 04/02/2018 2137   EOSABS 0.1 04/02/2018 2137   BASOSABS 0.0 04/02/2018 2137    Lab Results  Component Value Date   HGBA1C 5.4 03/18/2018    Assessment & Plan:   1. Flu-like symptoms Rapid flu test is negative Low suspicion for COVID-19 however she has been advised to call the clinic in the even that she develops fever, dyspnea Advised to be out of work for the next 48 hours. - POCT Influenza A/B - Respiratory virus panel  2. URI with cough and congestion - cetirizine (ZYRTEC) 10 MG tablet; Take 1 tablet (10 mg total) by mouth at bedtime. To help with congestion  Dispense: 30 tablet; Refill: 1  3. Bacterial vaginosis - metroNIDAZOLE (FLAGYL) 500 MG tablet; Take 1 tablet (500 mg total) by mouth 2 (two) times daily.  Dispense: 14 tablet; Refill: 0   Meds ordered this encounter  Medications  . cetirizine (ZYRTEC) 10 MG tablet    Sig: Take 1 tablet (10 mg total) by mouth at bedtime. To help with congestion    Dispense:  30 tablet    Refill:  1  . metroNIDAZOLE (FLAGYL) 500 MG tablet    Sig: Take 1 tablet (500 mg total) by mouth 2 (two) times daily.    Dispense:  14 tablet    Refill:  0     Follow-up: Return for follow up of chronic medical conditions, keep previously scheduled appointment.       Charlott Rakes, MD, FAAFP. [facility], [place]   09/28/2018, 3:20 PM

## 2018-09-29 ENCOUNTER — Telehealth: Payer: Self-pay | Admitting: Family Medicine

## 2018-09-29 NOTE — Telephone Encounter (Signed)
Called to check on patient.  She still does not feel better but has not developed a fever.  Will write an extension work note.

## 2018-10-04 LAB — RESPIRATORY VIRUS PANEL
Adenovirus: NEGATIVE
Influenza A: NEGATIVE
Influenza B: NEGATIVE
Metapneumovirus: NEGATIVE
Parainfluenza 1: NEGATIVE
Parainfluenza 2: NEGATIVE
Parainfluenza 3: NEGATIVE
Respiratory Syncytial Virus A: NEGATIVE
Respiratory Syncytial Virus B: NEGATIVE
Rhinovirus: NEGATIVE

## 2018-10-05 ENCOUNTER — Ambulatory Visit: Payer: Self-pay | Admitting: Family Medicine

## 2018-10-07 ENCOUNTER — Telehealth: Payer: Self-pay | Admitting: Family Medicine

## 2018-10-07 NOTE — Telephone Encounter (Signed)
Pt called stating that leg is strained wanted to know if her DR could approve of her to get a boot before her appointment 4/13 she doesn't want to miss work and she states shes standing on her shift so she doesn't want to put more pressure on it please follow up

## 2018-10-08 NOTE — Telephone Encounter (Signed)
Please advise on this request

## 2018-10-08 NOTE — Telephone Encounter (Signed)
I am not aware of any issues with the patient's leg strain.  Can you ask patient if this is something that she when she recently was evaluated by Dr. Margarita Rana

## 2018-10-13 NOTE — Telephone Encounter (Signed)
MA unable to reach patient via telephone. Patient was instructed to return a phone call to the clinic to clarify her need.

## 2018-10-14 NOTE — Telephone Encounter (Signed)
MA unable to reach patient. Left another voice message for clarity on concern and need.

## 2018-10-14 NOTE — Telephone Encounter (Signed)
Please call patient and see if she would like a sooner tele-health appointment or if she wishes to go to urgent care if she is having difficulty walking

## 2018-10-21 NOTE — Telephone Encounter (Signed)
Medical Assistant left message on patient's home and cell voicemail. Voicemail states to give a call back to [place] with [facility] at [phone]. Patient is aware of mondays visit being a tele-visit to discuss her request for a leg boot.

## 2018-10-26 ENCOUNTER — Ambulatory Visit: Payer: Self-pay | Admitting: Family Medicine

## 2018-10-26 ENCOUNTER — Other Ambulatory Visit: Payer: Self-pay

## 2018-10-26 ENCOUNTER — Encounter: Payer: Self-pay | Admitting: Nurse Practitioner

## 2018-10-26 ENCOUNTER — Ambulatory Visit (INDEPENDENT_AMBULATORY_CARE_PROVIDER_SITE_OTHER): Payer: BLUE CROSS/BLUE SHIELD | Admitting: Nurse Practitioner

## 2018-10-26 DIAGNOSIS — M79604 Pain in right leg: Secondary | ICD-10-CM

## 2018-10-26 DIAGNOSIS — I739 Peripheral vascular disease, unspecified: Secondary | ICD-10-CM | POA: Diagnosis not present

## 2018-10-26 MED ORDER — MISC. DEVICES MISC: 0 refills | Status: DC

## 2018-10-26 MED ORDER — IBUPROFEN 800 MG PO TABS: 800.0000 mg | ORAL_TABLET | Freq: Three times a day (TID) | ORAL | 1 refills | Status: AC | PRN
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
## 2018-11-16 ENCOUNTER — Ambulatory Visit: Payer: BLUE CROSS/BLUE SHIELD | Attending: Family Medicine | Admitting: Family Medicine

## 2018-11-16 ENCOUNTER — Ambulatory Visit: Payer: Self-pay

## 2018-11-16 ENCOUNTER — Other Ambulatory Visit: Payer: Self-pay

## 2018-11-16 ENCOUNTER — Encounter: Payer: Self-pay | Admitting: Family Medicine

## 2018-11-16 VITALS — BP 129/85 | HR 73 | Temp 98.6°F | Ht 64.0 in | Wt 187.2 lb

## 2018-11-16 DIAGNOSIS — M25571 Pain in right ankle and joints of right foot: Secondary | ICD-10-CM

## 2018-11-16 DIAGNOSIS — I83811 Varicose veins of right lower extremities with pain: Secondary | ICD-10-CM

## 2018-11-16 DIAGNOSIS — M25561 Pain in right knee: Secondary | ICD-10-CM | POA: Diagnosis not present

## 2018-11-16 DIAGNOSIS — G8929 Other chronic pain: Secondary | ICD-10-CM

## 2018-11-16 DIAGNOSIS — K5900 Constipation, unspecified: Secondary | ICD-10-CM
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
## 2018-11-16 NOTE — Patient Instructions (Addendum)
Varicose Veins Varicose veins are veins that have become enlarged, bulged, and twisted. They most often appear in the legs. What are the causes? This condition is caused by damage to the valves in the vein. These valves help blood return to your heart. When they are damaged and they stop working properly, blood may flow backward and back up in the veins near the skin, causing the veins to get larger and appear twisted. The condition can result from any issue that causes blood to back up, like pregnancy, prolonged standing, or obesity. What increases the risk? This condition is more likely to develop in people who are:  On their feet a lot.  Pregnant.  Overweight. What are the signs or symptoms? Symptoms of this condition include:  Bulging, twisted, and bluish veins.  A feeling of heaviness. This may be worse at the end of the day.  Leg pain. This may be worse at the end of the day.  Swelling in the leg.  Changes in skin color over the veins. How is this diagnosed? This condition may be diagnosed based on your symptoms, a physical exam, and an ultrasound test. How is this treated? Treatment for this condition may involve:  Avoiding sitting or standing in one position for long periods of time.  Wearing compression stockings. These stockings help to prevent blood clots and reduce swelling in the legs.  Raising (elevating) the legs when resting.  Losing weight.  Exercising regularly. If you have persistent symptoms or want to improve the way your varicose veins look, you may choose to have a procedure to close the varicose veins off or to remove them. Treatments to close off the veins include:  Sclerotherapy. In this treatment, a solution is injected into a vein to close it off.  Laser treatment. In this treatment, the vein is heated with a laser to close it off.  Radiofrequency vein ablation. In this treatment, an electrical current produced by radio waves is used to close  off the vein. Treatments to remove the veins include:  Phlebectomy. In this treatment, the veins are removed through small incisions made over the veins.  Vein ligation and stripping. In this treatment, incisions are made over the veins. The veins are then removed after being tied (ligated) with stitches (sutures). Follow these instructions at home: Activity  Walk as much as possible. Walking increases blood flow. This helps blood return to the heart and takes pressure off your veins. It also increases your cardiovascular strength.  Follow your health care provider's instructions about exercising.  Do not stand or sit in one position for a long period of time.  Do not sit with your legs crossed.  Rest with your legs raised during the day. General instructions   Follow any diet instructions given to you by your health care provider.  Wear compression stockings as directed by your health care provider. Do not wear other kinds of tight clothing around your legs, pelvis, or waist.  Elevate your legs at night to above the level of your heart.  If you get a cut in the skin over the varicose vein and the vein bleeds: ? Lie down with your leg raised. ? Apply firm pressure to the cut with a clean cloth until the bleeding stops. ? Place a bandage (dressing) on the cut. Contact a health care provider if:  The skin around your varicose veins starts to break down.  You have pain, redness, tenderness, or hard swelling over a vein.  You   are uncomfortable because of pain.  You get a cut in the skin over a varicose vein and it will not stop bleeding. Summary  Varicose veins are veins that have become enlarged, bulged, and twisted. They most often appear in the legs.  This condition is caused by damage to the valves in the vein. These valves help blood return to your heart.  Treatment for this condition includes frequent movements, wearing compression stockings, losing weight, and  exercising regularly. In some cases, procedures are done to close off or remove the veins.  Treatment for this condition may include wearing compression stockings, elevating the legs, losing weight, and engaging in regular activity. In some cases, procedures are done to close off or remove the veins. This information is not intended to replace advice given to you by your health care provider. Make sure you discuss any questions you have with your health care provider. Document Released: 04/10/2005 Document Revised: 07/24/2016 Document Reviewed: 07/24/2016 Elsevier Interactive Patient Education  2019 [employer].  Constipation, Adult Constipation is when a person:  Poops (has a bowel movement) fewer times in a week than normal.  Has a hard time pooping.  Has poop that is dry, hard, or bigger than normal. Follow these instructions at home: Eating and drinking   Eat foods that have a lot of fiber, such as: ? Fresh fruits and vegetables. ? Whole grains. ? Beans.  Eat less of foods that are high in fat, low in fiber, or overly processed, such as: ? [place] fries. ? Hamburgers. ? Cookies. ? Candy. ? Soda.  Drink enough fluid to keep your pee (urine) clear or pale yellow. General instructions  Exercise regularly or as told by your doctor.  Go to the restroom when you feel like you need to poop. Do not hold it in.  Take over-the-counter and prescription medicines only as told by your doctor. These include any fiber supplements.  Do pelvic floor retraining exercises, such as: ? Doing deep breathing while relaxing your lower belly (abdomen). ? Relaxing your pelvic floor while pooping.  Watch your condition for any changes.  Keep all follow-up visits as told by your doctor. This is important. Contact a doctor if:  You have pain that gets worse.  You have a fever.  You have not pooped for 4 days.  You throw up (vomit).  You are not hungry.  You lose weight.  You are  bleeding from the anus.  You have thin, pencil-like poop (stool). Get help right away if:  You have a fever, and your symptoms suddenly get worse.  You leak poop or have blood in your poop.  Your belly feels hard or bigger than normal (is bloated).  You have very bad belly pain.  You feel dizzy or you faint. This information is not intended to replace advice given to you by your health care provider. Make sure you discuss any questions you have with your health care provider. Document Released: 12/18/2007 Document Revised: 01/19/2016 Document Reviewed: 12/20/2015 Elsevier Interactive Patient Education  2019 [employer].
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
## 2018-11-17 ENCOUNTER — Telehealth: Payer: Self-pay | Admitting: Family Medicine

## 2018-11-17 NOTE — Telephone Encounter (Signed)
Called patient and informed her with what provider stated. Patient verbalized understanding.

## 2018-11-17 NOTE — Telephone Encounter (Signed)
Neither of these things were mentioned during patient's in-office visit yesterday. No testing was done for BV as patient did not mention. I will review her chart and see if these refills are appropriate

## 2018-11-17 NOTE — Telephone Encounter (Signed)
Patient called stating she would like refills for her Fioricet and antibiotics for her B.V. Per pt during her visit, provider informed her she was going to send those medications in for her and she still have not received them.

## 2018-11-17 NOTE — Telephone Encounter (Signed)
Pt called in wanting to clarify what medications she has to take currently please follow up

## 2018-11-19 ENCOUNTER — Other Ambulatory Visit: Payer: Self-pay | Admitting: Family Medicine

## 2018-11-19 DIAGNOSIS — R519 Headache, unspecified: Secondary | ICD-10-CM

## 2018-11-19 DIAGNOSIS — R51 Headache: Principal | ICD-10-CM

## 2018-11-19 MED ORDER — METRONIDAZOLE 500 MG PO TABS: 500.0000 mg | ORAL_TABLET | Freq: Two times a day (BID) | ORAL | 0 refills | Status: DC

## 2018-11-19 MED ORDER — BUTALBITAL-APAP-CAFFEINE 50-325-40 MG PO TABS: 1.0000 | ORAL_TABLET | Freq: Four times a day (QID) | ORAL | 1 refills | Status: AC | PRN

## 2018-11-19 NOTE — Progress Notes (Signed)
Patient ID: Caitlin Valdez, female   DOB: 25-Nov-1970, 48 y.o.   MRN: [id number]   Patient was seen for recent office visit but later called back and left phone message requesting refill of Fioricet and treatment for bacterial vaginitis.  Patient did not mention either issue during her recent appointment despite stating that she had requested these medications and mention these topics during her recent visit.  A refill will be sent in for Fioricet and metronidazole x7 days but patient will need to schedule follow-up appointment if she believes that she continues to have issues with BV as no testing was done at her recent visit as she did not mention issues with possible BV.

## 2018-11-26 ENCOUNTER — Telehealth: Payer: Self-pay | Admitting: Family Medicine

## 2018-11-26 NOTE — Telephone Encounter (Signed)
Patient was informed that we received the paperwork on 11/25/2018 and we are allotted 14 business days to complete the paperwork and submit it back to the employer. She stated that it is due by 12/08/2018.

## 2018-11-30 NOTE — Telephone Encounter (Signed)
Patient came by office and completed release of information for office to fax her forms and office notes as requested. Forms was faxed to [phone] and staff called patient and informed her that forms were faxed. Patient verbalized understanding. Patient also requested a copy of the forms that provider completed and a copy was given to her when she came into office to complete release forms. Original form will be sent to the scan center.

## 2018-12-04 ENCOUNTER — Telehealth: Payer: Self-pay | Admitting: *Deleted

## 2018-12-04 NOTE — Telephone Encounter (Signed)
Office received fax from [employer] requesting office notes from provider/Treatment Notes or status report describing the physical exam finding.    Spoke with patient and informed her that Howell Rucks with her FMLA are requesting notes to be release and per her release form she filled out, she did not put on there that office can release notes. Informed patient that she only put on there that office can release forms to them. Per pt she don't understand why Howell Rucks is needing notes because the forms that the provider completed should be good enough. Staff informed patient that she would need to contact [facility] and ask them that questions. Staff then informed patient that notes will not be sent to [facility] until she come back to office and complete a new release that states she is giving office permission to release the provider's notes that's in her chart. Patient agreed and verbalized understanding. Staff informed patient that release will be at the front waiting for her and patient verbalized understanding.   Staff faxed forms and previous release back to [place] and on the face sheet informing that patient did not indicate that it was okay to release Office to release note to them. Forms and Fax sheet and Confirmation will be sent to scan center to be put in patient's chart.

## 2018-12-04 NOTE — Telephone Encounter (Signed)
Per her AVS, she was to schedule a follow-up appointment here in about 2 weeks after her 11/16/2018 visit. It do not have a follow-up visit with her to document the need for her work extension but she does have a follow-up appointment with Orthopedics on 12/08/18 at 10 am. She should make an appointment here next week so that I can document the reason that she has not yet returned or she can ask Orthopedics

## 2018-12-04 NOTE — Telephone Encounter (Signed)
Patient called stating she called yesterday for front staff to send a message to provider to extend her leave from work. Per pt, she have not had the chance yet to go to these other appt that she was referred to. Per pt she have the appts scheduled but now she needs an extension on her leave from work. Per pt she need that letter. Informed patient that there is no notes on file about her calling this office and requesting an extension letter.

## 2018-12-08 ENCOUNTER — Encounter: Payer: Self-pay | Admitting: Physician Assistant

## 2018-12-08 ENCOUNTER — Other Ambulatory Visit: Payer: Self-pay

## 2018-12-08 ENCOUNTER — Ambulatory Visit (INDEPENDENT_AMBULATORY_CARE_PROVIDER_SITE_OTHER): Payer: BLUE CROSS/BLUE SHIELD | Admitting: Physician Assistant

## 2018-12-08 ENCOUNTER — Ambulatory Visit: Payer: BLUE CROSS/BLUE SHIELD

## 2018-12-08 VITALS — Ht 64.0 in | Wt 187.0 lb

## 2018-12-08 DIAGNOSIS — M5441 Lumbago with sciatica, right side: Secondary | ICD-10-CM

## 2018-12-08 DIAGNOSIS — G8929 Other chronic pain: Secondary | ICD-10-CM

## 2018-12-08 MED ORDER — PREDNISONE 10 MG PO TABS: 20.0000 mg | ORAL_TABLET | Freq: Every day | ORAL | 1 refills | Status: DC

## 2018-12-08 MED ORDER — TRAMADOL HCL 50 MG PO TABS: 50.0000 mg | ORAL_TABLET | Freq: Four times a day (QID) | ORAL | 0 refills | Status: DC | PRN
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
## 2018-12-08 NOTE — Telephone Encounter (Signed)
Spoke with patient and informed her with what provider stated and she verbalized understanding and she made an appt.

## 2018-12-10 ENCOUNTER — Ambulatory Visit: Payer: BLUE CROSS/BLUE SHIELD | Admitting: Family Medicine

## 2018-12-10 ENCOUNTER — Ambulatory Visit: Payer: BLUE CROSS/BLUE SHIELD | Attending: Family Medicine | Admitting: Physician Assistant

## 2018-12-10 ENCOUNTER — Other Ambulatory Visit: Payer: Self-pay

## 2018-12-10 DIAGNOSIS — G8929 Other chronic pain: Secondary | ICD-10-CM

## 2018-12-10 DIAGNOSIS — M5441 Lumbago with sciatica, right side: Secondary | ICD-10-CM

## 2018-12-10 NOTE — Progress Notes (Signed)
Pt. Is asking for a note saying that she is till in our care from May 19th-25th.  She stated Dr. Chapman Fitch wrote her a note for May 4th-18th.

## 2018-12-10 NOTE — Progress Notes (Signed)
Virtual Visit via Telephone Note  I connected with Caitlin Valdez on 12/10/18 at  9:50 AM EDT by telephone and verified that I am speaking with the correct person using two identifiers.   I discussed the limitations, risks, security and privacy concerns of performing an evaluation and management service by telephone and the availability of in person appointments. I also discussed with the patient that there may be a patient responsible charge related to this service. The patient expressed understanding and agreed to proceed.  Patient location:  home My Location:  [place] office Persons on the call:  Myself and the patient    History of Present Illness: I am doing a televisit in the absence of the patient's PCP(Dr Fulp) being out of the office today.  The patient scheduled the visit to request a work note covering the dates of may 19-25.  On May 26th she was seen by ortho and they took her OOW for 3 additional weeks.    Pt. Is asking for a note saying that she is till in our care from May 19th-25th.  She stated Dr. Chapman Fitch wrote her a note for May 4th-18th.   Observations/Objective:  A&Ox3. TP linear.  Speech is clear.   Assessment and Plan: 1. Chronic right-sided low back pain with right-sided sciatica Message sent to Dr fulp to see if this is ok    Follow Up Instructions: 2-3 months   I discussed the assessment and treatment plan with the patient. The patient was provided an opportunity to ask questions and all were answered. The patient agreed with the plan and demonstrated an understanding of the instructions.   The patient was advised to call back or seek an in-person evaluation if the symptoms worsen or if the condition fails to improve as anticipated.  I provided 5 minutes of non-face-to-face time during this encounter.   Freeman Caldron, PA-C  Patient ID: Caitlin Valdez, female   DOB: Nov 26, 1970, 48 y.o.   MRN: [id number]

## 2018-12-12 ENCOUNTER — Telehealth: Payer: Self-pay | Admitting: Cardiology

## 2018-12-12 DIAGNOSIS — Z20822 Contact with and (suspected) exposure to covid-19: Secondary | ICD-10-CM

## 2018-12-12 NOTE — Telephone Encounter (Signed)
Spoke to patient regarding recent office visit 5/26, with potential exposure to Covid. Order placed for Covid test on Sunday 5/31 @ 1200

## 2018-12-13 ENCOUNTER — Other Ambulatory Visit: Payer: Self-pay

## 2018-12-13 DIAGNOSIS — Z20822 Contact with and (suspected) exposure to covid-19: Secondary | ICD-10-CM

## 2018-12-14 ENCOUNTER — Telehealth: Payer: Self-pay | Admitting: Family Medicine

## 2018-12-14 LAB — NOVEL CORONAVIRUS, NAA: SARS-CoV-2, NAA: NOT DETECTED

## 2018-12-14 NOTE — Telephone Encounter (Signed)
Patient came to the office stating she needs a letter for the 19- 25

## 2018-12-14 NOTE — Telephone Encounter (Signed)
Patient came and dropped off paperwork for the provider to fill out.

## 2018-12-17 NOTE — Telephone Encounter (Signed)
Patient called to check on her paperwork. Advised of 7-10 processing days for paperwork. Please follow up

## 2018-12-18 ENCOUNTER — Telehealth: Payer: Self-pay | Admitting: Emergency Medicine

## 2018-12-18 ENCOUNTER — Telehealth: Payer: Self-pay

## 2018-12-18 NOTE — Telephone Encounter (Signed)
I called and sw pt. I advised that her disability forms were faxed from [place] on 12/14/18 the office was closed. I have completed the forms today and I would fax them for her. Advised that the start date was the first office visit in our office and the estimated end date was 01/04/19. Several days after her next office visit to give [place] time to update the insurance company in the event that she needs to have further imaging or referral. Pt voiced understanding and pleased with plan.

## 2018-12-18 NOTE — Telephone Encounter (Signed)
This is a patient of Dr. Chapman Fitch. Octavia and Dr. Chapman Fitch have been working with the patient. Paperwork is being faxed today along with medication list and progress notes.

## 2018-12-18 NOTE — Telephone Encounter (Signed)
Patient arrived and signed required paperwork which was then faxed to insurance company.

## 2018-12-18 NOTE — Telephone Encounter (Signed)
Patients called.  Patient identified by name and date of birth.  Patient advised that she needed to come to clinic to sign paperwork.  Patient acknowledged such and indicated that she would be on her way to do so.

## 2018-12-21 ENCOUNTER — Other Ambulatory Visit: Payer: Self-pay

## 2018-12-21 ENCOUNTER — Ambulatory Visit (HOSPITAL_COMMUNITY)
Admission: RE | Admit: 2018-12-21 | Discharge: 2018-12-21 | Disposition: A | Discharge status: Home / Self Care | Payer: BC Managed Care – PPO | Source: Ambulatory Visit | Attending: Nurse Practitioner | Admitting: Nurse Practitioner

## 2018-12-21 DIAGNOSIS — I739 Peripheral vascular disease, unspecified: Secondary | ICD-10-CM | POA: Diagnosis present

## 2018-12-21 DIAGNOSIS — M79604 Pain in right leg: Secondary | ICD-10-CM | POA: Diagnosis present

## 2018-12-21 NOTE — Progress Notes (Signed)
Lower extremity venous dopplers showed no evidence of DVT (deep venous thrombosis)/blood clot

## 2018-12-21 NOTE — Progress Notes (Signed)
Right lower extremity venous duplex has been completed. Preliminary results can be found in CV Proc through chart review.  Results were faxed to Dr. Siri Cole office.  12/21/18 9:16 AM Caitlin Valdez RVT

## 2018-12-22 NOTE — Telephone Encounter (Signed)
Caitlin Valdez had the paperwork most recently as patient needed to come in to sign the record release on the form so that the form and requested paperwork could be returned

## 2018-12-23 ENCOUNTER — Other Ambulatory Visit: Payer: Self-pay | Admitting: Family Medicine

## 2018-12-23 DIAGNOSIS — G8929 Other chronic pain: Secondary | ICD-10-CM

## 2018-12-23 NOTE — Telephone Encounter (Signed)
Per pt from what she knows, all the forms was completed.   Per pt the Ortho wrote her some Tramadol but it is not working. Per pt she would like something more then that. Staff informed that she may need to be referred to pain management and patient stated she don't mind. Per pt she just need something that will relax her for her to be able to sleep at night. Staff informed patient that message will be sent to provider.

## 2018-12-23 NOTE — Progress Notes (Signed)
Patient is aware of results and verbalized understanding.

## 2018-12-23 NOTE — Telephone Encounter (Signed)
Called patient and informed her with what provider stated and patient verbalized understanding and agreed with advise.

## 2018-12-23 NOTE — Progress Notes (Signed)
Patient ID: Caitlin Valdez, female   DOB: 1971-05-05, 48 y.o.   MRN: [id number]   Phone call received from patient that when she recently saw orthopedics she was given tramadol for pain and this was not effective.  See phone message.  Pain management referral will be placed for patient and patient is encouraged to contact orthopedics regarding the ineffectiveness of tramadol for treatment of her pain.

## 2018-12-23 NOTE — Telephone Encounter (Signed)
I will place a referral to pain management and she should also call Orthopedics about the pain medication not being effective

## 2018-12-31 ENCOUNTER — Encounter: Payer: Self-pay | Admitting: Physician Assistant

## 2018-12-31 ENCOUNTER — Ambulatory Visit (INDEPENDENT_AMBULATORY_CARE_PROVIDER_SITE_OTHER): Payer: BC Managed Care – PPO | Admitting: Orthopedic Surgery

## 2018-12-31 ENCOUNTER — Other Ambulatory Visit: Payer: Self-pay

## 2018-12-31 VITALS — Ht 64.0 in | Wt 187.0 lb

## 2018-12-31 DIAGNOSIS — G8929 Other chronic pain: Secondary | ICD-10-CM | POA: Diagnosis not present

## 2018-12-31 DIAGNOSIS — M5441 Lumbago with sciatica, right side: Secondary | ICD-10-CM

## 2018-12-31 NOTE — Progress Notes (Signed)
Office Visit Note   Patient: Caitlin Valdez           Date of Birth: 07/08/1971           MRN: [id number] Visit Date: 12/31/2018              Requested by: Antony Blackbird, MD [place],  [place] [address] PCP: Antony Blackbird, MD  Chief Complaint  Patient presents with  . Lower Back - Follow-up      HPI: Patient is a 48 year old woman who presents with persistent lower back pain.  She tried low-dose prednisone 20 mg with breakfast states that this did not help states that the Ultram did not help.  Patient states she is currently out of work stating that she has to stand for 8 hours a day at [employer] and this is too painful for her to work.  She states she is out of work through July.  Patient states that her primary care physician has made appointment for her to go to a pain clinic.  She states that her radicular symptoms are worse on the right side than the left and she states her legs go numb if she sits on the toilet for prolonged periods of time.  She states her symptoms are the same with sitting standing walking or laying down.  Assessment & Plan: Visit Diagnoses:  1. Chronic right-sided low back pain with right-sided sciatica     Plan: We will set her up for an MRI scan to further evaluate the lumbar spine.  Recommended against narcotic pain medicine.  Follow-Up Instructions: Return if symptoms worsen or fail to improve.   Ortho Exam  Patient is alert, oriented, no adenopathy, well-dressed, normal affect, normal respiratory effort. Examination patient has an antalgic gait.  She has no pain with range of motion of the hip knee or ankles bilaterally.  She has a negative straight leg raise bilaterally and no focal motor weakness with hip flexion ankle flexion dorsiflexion.  Review of the radiographs show joint space narrowing of the lumbar spine worse at L5-S1.  Imaging: No results found. No images are attached to the encounter.  Labs: Lab Results  Component Value  Date   HGBA1C 5.4 03/18/2018   HGBA1C 5.7 12/17/2016   HGBA1C 5.2 12/23/2013   ESRSEDRATE 6 06/22/2013   LABURIC 4.7 06/22/2013   REPTSTATUS 03/20/2018 FINAL 03/17/2018   GRAMSTAIN  03/17/2018    MODERATE WBC PRESENT, PREDOMINANTLY PMN RARE GRAM POSITIVE COCCI Performed at [facility], [place] [address], [place], [place] [address]    CULT FEW METHICILLIN RESISTANT STAPHYLOCOCCUS AUREUS 03/17/2018   LABORGA METHICILLIN RESISTANT STAPHYLOCOCCUS AUREUS 03/17/2018     Lab Results  Component Value Date   ALBUMIN 4.0 04/02/2018   ALBUMIN 3.0 (L) 03/18/2018   ALBUMIN 3.2 (L) 03/17/2018   LABURIC 4.7 06/22/2013    Body mass index is 32.1 kg/m.  Orders:  No orders of the defined types were placed in this encounter.  No orders of the defined types were placed in this encounter.    Procedures: No procedures performed  Clinical Data: No additional findings.  ROS:  All other systems negative, except as noted in the HPI. Review of Systems  Objective: Vital Signs: Ht 5\' 4"  (1.626 m)   Wt 187 lb (84.8 kg)   BMI 32.10 kg/m   Specialty Comments:  No specialty comments available.  PMFS History: Patient Active Problem List   Diagnosis Date Noted  . Anxiety  and depression 03/17/2018  . Hematuria 06/27/2016  . S/P endometrial ablation 06/27/2016  . Lower abdominal pain 06/27/2016  . Migraine without status migrainosus, not intractable 06/27/2016  . Vitamin D insufficiency 02/11/2016  . De Quervain's tenosynovitis, left 02/09/2016  . Vitamin B12 deficiency 10/17/2015  . Allergic rhinitis 12/29/2014  . Current smoker 11/22/2014  . Obesity 06/29/2014  . Spinal stenosis in cervical region 02/22/2014  . Fibromyalgia 07/15/2009   Past Medical History:  Diagnosis Date  . Abdominal pain, right lower quadrant   . Anxiety   . Depression   . Fibromyalgia   . GERD (gastroesophageal reflux disease)   . Migraine   . Obesity   . Peripheral vascular disease (HCC)     RIGHT LEG  . PONV (postoperative nausea and vomiting)   . Stroke ([place])   . Trichomonas 2012    Family History  Problem Relation Age of Onset  . Diabetes Mother   . Hypertension Mother   . Breast cancer Mother 79  . Diabetes Father   . Hypertension Father   . Diabetes Other   . Hypertension Other   . Other Neg Hx     Past Surgical History:  Procedure Laterality Date  . CESAREAN SECTION    . FRACTURE SURGERY    . HYSTEROSCOPY WITH NOVASURE N/A 08/08/2014   Procedure:  NOVASURE;  Surgeon: Emily Filbert, MD;  Location: [facility];  Service: Gynecology;  Laterality: N/A;  . VEIN SURGERY     Social History   Occupational History  . Not on file  Tobacco Use  . Smoking status: Current Every Day Smoker    Packs/day: 1.00    Years: 5.00    Pack years: 5.00    Types: Cigarettes  . Smokeless tobacco: Never Used  Substance and Sexual Activity  . Alcohol use: No  . Drug use: No  . Sexual activity: Yes    Birth control/protection: Condom

## 2019-01-01 ENCOUNTER — Telehealth: Payer: Self-pay | Admitting: Orthopedic Surgery

## 2019-01-01 NOTE — Telephone Encounter (Signed)
Pt called in said dr.duda gave her a note taking her out of work till 12-31-18 and he was suppose to extend that till after the mri was taken care of.  [phone]

## 2019-01-01 NOTE — Telephone Encounter (Signed)
Patient was called and can pick up OOW note from front desk.

## 2019-01-19 ENCOUNTER — Other Ambulatory Visit: Payer: Self-pay

## 2019-01-19 ENCOUNTER — Telehealth: Payer: Self-pay

## 2019-01-19 DIAGNOSIS — G8929 Other chronic pain: Secondary | ICD-10-CM

## 2019-01-19 NOTE — Telephone Encounter (Signed)
I called pt and lm on vm to advise that insurance will not cover her MRI L spine at this time. They want her to complete 6 weeks of physical therapy and if at that time she is still symptomatic we can then again apply for precert of the procedure. To call with any questions. A referral was entered for [facility] outpatient therapy on [address] and they should be calling to set this up for her.

## 2019-01-25 ENCOUNTER — Other Ambulatory Visit: Payer: BC Managed Care – PPO

## 2019-01-27 ENCOUNTER — Other Ambulatory Visit: Payer: Self-pay

## 2019-01-27 ENCOUNTER — Encounter: Payer: Self-pay | Admitting: Physical Therapy

## 2019-01-27 ENCOUNTER — Ambulatory Visit: Payer: BC Managed Care – PPO | Attending: Orthopedic Surgery | Admitting: Physical Therapy

## 2019-01-27 DIAGNOSIS — M6281 Muscle weakness (generalized): Secondary | ICD-10-CM

## 2019-01-27 DIAGNOSIS — R2689 Other abnormalities of gait and mobility: Secondary | ICD-10-CM | POA: Diagnosis present

## 2019-01-27 DIAGNOSIS — M5441 Lumbago with sciatica, right side: Secondary | ICD-10-CM | POA: Insufficient documentation

## 2019-01-27 DIAGNOSIS — G8929 Other chronic pain: Secondary | ICD-10-CM | POA: Insufficient documentation

## 2019-01-27 NOTE — Patient Instructions (Signed)
Access Code: [email or web address]  URL: [email or web address]  Date: 01/27/2019  Prepared by: Elsie Ra   Exercises  Slump Stretch - 10 reps - 1-2 sets - 3 hold - 2x daily - 6x weekly  Supine Piriformis Stretch - 3 reps - 1 sets - 30 hold - 2x daily - 6x weekly  Supine Lower Trunk Rotation - 2-3 reps - 1 sets - 10 hold - 2x daily - 6x weekly  Beginner Bridge - 10 reps - 3 sets - 2x daily - 6x weekly  Patient Education  TENS Unit TENS UNIT: This is helpful for muscle pain and spasm.   Search and Purchase a TENS 7000 2nd edition at www.tenspros.com. It should be less than $30.     TENS unit instructions: Do not shower or bathe with the unit on Turn the unit off before removing electrodes or batteries If the electrodes lose stickiness add a drop of water to the electrodes after they are disconnected from the unit and place on plastic sheet. If you continued to have difficulty, call the TENS unit company to purchase more electrodes. Do not apply lotion on the skin area prior to use. Make sure the skin is clean and dry as this will help prolong the life of the electrodes. After use, always check skin for unusual red areas, rash or other skin difficulties. If there are any skin problems, does not apply electrodes to the same area. Never remove the electrodes from the unit by pulling the wires. Do not use the TENS unit or electrodes other than as directed. Do not change electrode placement without consultating your therapist or physician. Keep 2 fingers with between each electrode. Wear time ratio is 2:1, on to off times.    For example on for 30 minutes off for 15 minutes and then on for 30 minutes off for 15 minutes
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
## 2019-01-27 NOTE — Addendum Note (Signed)
Addended by: Debbe Odea on: 01/27/2019 02:06 PM   Modules accepted: Orders

## 2019-02-03 ENCOUNTER — Ambulatory Visit: Payer: BC Managed Care – PPO | Admitting: Physical Therapy

## 2019-02-04 ENCOUNTER — Telehealth: Payer: Self-pay | Admitting: Physical Therapy

## 2019-02-04 NOTE — Telephone Encounter (Signed)
Pt no show for PT appointment today. They where contacted and informed of this. They were provided their next appointment date and time and they confirmed they would be there.  Elsie Ra, PT, DPT 02/04/19 9:22 AM

## 2019-02-08 ENCOUNTER — Other Ambulatory Visit: Payer: Self-pay

## 2019-02-08 ENCOUNTER — Telehealth: Payer: Self-pay | Admitting: *Deleted

## 2019-02-08 ENCOUNTER — Ambulatory Visit: Payer: BC Managed Care – PPO | Admitting: Physical Therapy

## 2019-02-08 DIAGNOSIS — R2689 Other abnormalities of gait and mobility: Secondary | ICD-10-CM

## 2019-02-08 DIAGNOSIS — M5441 Lumbago with sciatica, right side: Secondary | ICD-10-CM | POA: Diagnosis not present

## 2019-02-08 DIAGNOSIS — G8929 Other chronic pain: Secondary | ICD-10-CM

## 2019-02-08 DIAGNOSIS — M6281 Muscle weakness (generalized): Secondary | ICD-10-CM

## 2019-02-08 NOTE — Telephone Encounter (Signed)
Pt came into office and wanted to know if Dr. Sharol Given could write her a work note to be out until after Aug. States she did not want to go back and then be out of work again due to pt has PT all of the month of Aug.  Please advise! [phone]

## 2019-02-08 NOTE — Therapy (Signed)
[place], [place], [address] Phone: [phone]   Fax:  [phone]  Physical Therapy Treatment  Patient Details  Name: Caitlin Valdez MRN: [id number] Date of Birth: [birth year] Referring Provider (PT): Newt Minion, MD   Encounter Date: 02/08/2019  PT End of Session - 02/08/19 1100    Visit Number  2    Number of Visits  12    Date for PT Re-Evaluation  03/10/19    Authorization Type  BCBS    PT Start Time  1006    PT Stop Time  1046    PT Time Calculation (min)  40 min    Activity Tolerance  Patient tolerated treatment well    Behavior During Therapy  [facility] for tasks assessed/performed       Past Medical History:  Diagnosis Date  . Abdominal pain, right lower quadrant   . Anxiety   . Depression   . Fibromyalgia   . GERD (gastroesophageal reflux disease)   . Migraine   . Obesity   . Peripheral vascular disease (HCC)    RIGHT LEG  . PONV (postoperative nausea and vomiting)   . Stroke ([place])   . Trichomonas 2012    Past Surgical History:  Procedure Laterality Date  . CESAREAN SECTION    . FRACTURE SURGERY    . HYSTEROSCOPY WITH NOVASURE N/A 08/08/2014   Procedure:  NOVASURE;  Surgeon: Emily Filbert, MD;  Location: [facility];  Service: Gynecology;  Laterality: N/A;  . VEIN SURGERY      There were no vitals filed for this visit.  Subjective Assessment - 02/08/19 1045    Subjective  Relays she has been trying to do her HEP every other day, her pain is still very high    Currently in Pain?  Yes    Pain Score  9     Pain Location  Back    Pain Orientation  Lower    Pain Descriptors / Indicators  Aching    Pain Type  Chronic pain                       [facility] Adult PT Treatment/Exercise - 02/08/19 0001      Ambulation/Gait   Gait Comments  did not observe antalgic gait today      Exercises   Exercises  Lumbar      Lumbar Exercises: Stretches   Single Knee to Chest Stretch   Right;Left;2 reps;30 seconds    Double Knee to Chest Stretch Limitations  on Pball 15 reps 5 sec    Lower Trunk Rotation Limitations  10 reps 3 sec    Pelvic Tilt Limitations  20 reps, attempted bridge but was painful    Piriformis Stretch  Right;Left;2 reps;30 seconds      Lumbar Exercises: Aerobic   Recumbent Bike  5 min no resistance      Lumbar Exercises: Supine   Clam Limitations  2X 10 with red    Bent Knee Raise  10 reps    Other Supine Lumbar Exercises  hip add ball sq 5 sec X 15      Moist Heat Therapy   Number Minutes Moist Heat  10 Minutes    Moist Heat Location  Lumbar Spine      Electrical Stimulation   Electrical Stimulation Location  lumbar    Electrical Stimulation Action  IFC in supine with legs elevated, lying on heat had  Electrical Stimulation Parameters  tolerance    Electrical Stimulation Goals  Pain             PT Education - 02/08/19 1059    Education Details  HEP review and modification    Person(s) Educated  Patient    Methods  Explanation;Verbal cues    Comprehension  Verbalized understanding          PT Long Term Goals - 01/27/19 1354      PT LONG TERM GOAL #1   Title  Pt will be I and compliant with HEP. (Target goal for all goals 6 weeks 03/10/19)    Status  New      PT LONG TERM GOAL #2   Title  Pt will improve ROM to [facility] to improve mobility    Status  New      PT LONG TERM GOAL #3   Title  Pt will improve leg strength to at least 5-/5 MMT to improve function    Status  New      PT LONG TERM GOAL #4   Title  Pt will improve FOTO to less than 46% limited to show improved function    Status  New      PT LONG TERM GOAL #5   Title  Pt will report overall pain decreased by 50% and improved pain with prolonged standing    Baseline  9 to 10 pain if standing more than one hour at work    Status  New            Plan - 02/08/19 1101    Clinical Impression Statement  Pt reported high pain level but at least did not have  antalgic gait today. She did have pain with bridge so this was modified to Pelvic tilts instead and she was instructed to make this change with her HEP. HEP was reviewed and she showed good understanding and return demonstration. Progress with more P ball exercises as tolerated as she says will get one for home.    Personal Factors and Comorbidities  Comorbidity 1;Comorbidity 2;Comorbidity 3+    Comorbidities  PMH:anx,dep,migranes, spinal stenosis in cervical, fibromyalgia, PVD Rt leg    Examination-Activity Limitations  Bend;Locomotion Level;Sleep;Squat;Stairs;Lift    Examination-Participation Restrictions  Meal Prep;Cleaning;Community Activity;Driving;Shop;Laundry    Stability/Clinical Decision Making  Evolving/Moderate complexity    Rehab Potential  Good    PT Frequency  2x / week    PT Duration  6 weeks    PT Treatment/Interventions  Cryotherapy;Electrical Stimulation;Iontophoresis 4mg /ml Dexamethasone;Moist Heat;Traction;Ultrasound;Gait training;Stair training;Therapeutic activities;Therapeutic exercise;Neuromuscular re-education;Patient/family education;Manual techniques;Taping;Spinal Manipulations;Joint Manipulations;Dry needling    PT Next Visit Plan  modaltaties for pain, needs gentle stretching, core, and Rt leg strength,    PT Home Exercise Plan  LTR, slump and piriformis stretch, beginner bridge (was modified to pelvic tilts), DKTC on pball, supine clams    Consulted and Agree with Plan of Care  Patient       Patient will benefit from skilled therapeutic intervention in order to improve the following deficits and impairments:  Abnormal gait, Decreased activity tolerance, Decreased endurance, Decreased mobility, Difficulty walking, Decreased range of motion, Increased muscle spasms, Obesity, Pain, Postural dysfunction  Visit Diagnosis: 1. Chronic bilateral low back pain with right-sided sciatica   2. Muscle weakness (generalized)   3. Other abnormalities of gait and mobility         Problem List Patient Active Problem List   Diagnosis Date Noted  . Anxiety and depression 03/17/2018  .  Hematuria 06/27/2016  . S/P endometrial ablation 06/27/2016  . Lower abdominal pain 06/27/2016  . Migraine without status migrainosus, not intractable 06/27/2016  . Vitamin D insufficiency 02/11/2016  . De Quervain's tenosynovitis, left 02/09/2016  . Vitamin B12 deficiency 10/17/2015  . Allergic rhinitis 12/29/2014  . Current smoker 11/22/2014  . Obesity 06/29/2014  . Spinal stenosis in cervical region 02/22/2014  . Fibromyalgia 07/15/2009    Silvestre Mesi 02/08/2019, 11:05 AM  [facility] [address] [place], [place], [address] Phone: [phone]   Fax:  [phone]  Name: Caitlin [place] MRN: [id number] Date of Birth: [birth year]

## 2019-02-09 NOTE — Telephone Encounter (Signed)
I called and advised pt that the letter is ready for pick up at the front desk. She asked that it also be faxed to [phone] this was done today.

## 2019-02-10 ENCOUNTER — Other Ambulatory Visit: Payer: Self-pay

## 2019-02-10 ENCOUNTER — Ambulatory Visit: Payer: BC Managed Care – PPO | Admitting: Physical Therapy

## 2019-02-10 DIAGNOSIS — M5441 Lumbago with sciatica, right side: Secondary | ICD-10-CM

## 2019-02-10 DIAGNOSIS — G8929 Other chronic pain: Secondary | ICD-10-CM

## 2019-02-10 DIAGNOSIS — R2689 Other abnormalities of gait and mobility: Secondary | ICD-10-CM

## 2019-02-10 DIAGNOSIS — M6281 Muscle weakness (generalized): Secondary | ICD-10-CM

## 2019-02-10 NOTE — Therapy (Signed)
[place], [place], [address] Phone: [phone]   Fax:  [phone]  Physical Therapy Treatment  Patient Details  Name: Caitlin Valdez MRN: [id number] Date of Birth: [birth year] Referring Provider (PT): Newt Minion, MD   Encounter Date: 02/10/2019  PT End of Session - 02/10/19 1211    Visit Number  3    Number of Visits  12    Date for PT Re-Evaluation  03/10/19    Authorization Type  BCBS    PT Start Time  1143   pt 13 min late   PT Stop Time  1221    PT Time Calculation (min)  38 min    Activity Tolerance  Patient tolerated treatment well    Behavior During Therapy  [facility] for tasks assessed/performed       Past Medical History:  Diagnosis Date  . Abdominal pain, right lower quadrant   . Anxiety   . Depression   . Fibromyalgia   . GERD (gastroesophageal reflux disease)   . Migraine   . Obesity   . Peripheral vascular disease (HCC)    RIGHT LEG  . PONV (postoperative nausea and vomiting)   . Stroke ([place])   . Trichomonas 2012    Past Surgical History:  Procedure Laterality Date  . CESAREAN SECTION    . FRACTURE SURGERY    . HYSTEROSCOPY WITH NOVASURE N/A 08/08/2014   Procedure:  NOVASURE;  Surgeon: Emily Filbert, MD;  Location: [facility];  Service: Gynecology;  Laterality: N/A;  . VEIN SURGERY      There were no vitals filed for this visit.  Subjective Assessment - 02/10/19 1152    Subjective  Continues to rate high pain level, says she will get a ball so she can do more exercises with it.    Pertinent History  PMH:anx,dep,migranes, spinal stenosis in cervical, fibromyalgia, PVD Rt leg    How long can you sit comfortably?  30 min    How long can you stand comfortably?  30 minutes to an hour    How long can you walk comfortably?  an hour    Diagnostic tests  Imaging: radiographs show joint space narrowing of the lumbar spine worse at L5-S1. MRI has been ordered    Patient Stated Goals  make  her back more comfortable    Currently in Pain?  Yes    Pain Score  8     Pain Location  Back    Pain Onset  More than a month ago                       [facility] Adult PT Treatment/Exercise - 02/10/19 0001      Lumbar Exercises: Stretches   Double Knee to Chest Stretch Limitations  on Pball 15 reps 5 sec    Pelvic Tilt Limitations  20 reps, able to then progress to small bridges    Piriformis Stretch  Right;Left;2 reps;30 seconds      Lumbar Exercises: Aerobic   Nustep  6 min with heat L5, UE/LE      Lumbar Exercises: Standing   Row  20 reps    Theraband Level (Row)  Level 2 (Red)    Row Limitations  with TAC    Shoulder Extension  20 reps    Theraband Level (Shoulder Extension)  Level 2 (Red)    Shoulder Extension Limitations  wit TAC      Lumbar Exercises:  Seated   Other Seated Lumbar Exercises  on pball for marches and LAQ X 10 ea      Lumbar Exercises: Supine   Clam Limitations  2X 10 with red      Moist Heat Therapy   Number Minutes Moist Heat  10 Minutes    Moist Heat Location  Lumbar Spine      Electrical Stimulation   Electrical Stimulation Location  lumbar    Electrical Stimulation Action  IFC in supine with legs elevated on heat    Electrical Stimulation Parameters  tolerance    Electrical Stimulation Goals  Pain                  PT Long Term Goals - 01/27/19 1354      PT LONG TERM GOAL #1   Title  Pt will be I and compliant with HEP. (Target goal for all goals 6 weeks 03/10/19)    Status  New      PT LONG TERM GOAL #2   Title  Pt will improve ROM to [facility] to improve mobility    Status  New      PT LONG TERM GOAL #3   Title  Pt will improve leg strength to at least 5-/5 MMT to improve function    Status  New      PT LONG TERM GOAL #4   Title  Pt will improve FOTO to less than 46% limited to show improved function    Status  New      PT LONG TERM GOAL #5   Title  Pt will report overall pain decreased by 50% and improved pain  with prolonged standing    Baseline  9 to 10 pain if standing more than one hour at work    Status  New            Plan - 02/10/19 1212    Clinical Impression Statement  She continues to report high pain levels but shows good effort with exercise without complaints. More core added today with good tolerance. PT will continue to progress as able. TENS continued for pain reduction post tx    Personal Factors and Comorbidities  Comorbidity 1;Comorbidity 2;Comorbidity 3+    Comorbidities  PMH:anx,dep,migranes, spinal stenosis in cervical, fibromyalgia, PVD Rt leg    Examination-Activity Limitations  Bend;Locomotion Level;Sleep;Squat;Stairs;Lift    Examination-Participation Restrictions  Meal Prep;Cleaning;Community Activity;Driving;Shop;Laundry    Stability/Clinical Decision Making  Evolving/Moderate complexity    Rehab Potential  Good    PT Frequency  2x / week    PT Duration  6 weeks    PT Treatment/Interventions  Cryotherapy;Electrical Stimulation;Iontophoresis 4mg /ml Dexamethasone;Moist Heat;Traction;Ultrasound;Gait training;Stair training;Therapeutic activities;Therapeutic exercise;Neuromuscular re-education;Patient/family education;Manual techniques;Taping;Spinal Manipulations;Joint Manipulations;Dry needling    PT Next Visit Plan  modaltaties for pain, needs gentle stretching, core, and Rt leg strength,    PT Home Exercise Plan  LTR, slump and piriformis stretch, beginner bridge (was modified to pelvic tilts), DKTC on pball, supine clams    Consulted and Agree with Plan of Care  Patient       Patient will benefit from skilled therapeutic intervention in order to improve the following deficits and impairments:  Abnormal gait, Decreased activity tolerance, Decreased endurance, Decreased mobility, Difficulty walking, Decreased range of motion, Increased muscle spasms, Obesity, Pain, Postural dysfunction  Visit Diagnosis: 1. Chronic bilateral low back pain with right-sided sciatica    2. Muscle weakness (generalized)   3. Other abnormalities of gait and mobility  Problem List Patient Active Problem List   Diagnosis Date Noted  . Anxiety and depression 03/17/2018  . Hematuria 06/27/2016  . S/P endometrial ablation 06/27/2016  . Lower abdominal pain 06/27/2016  . Migraine without status migrainosus, not intractable 06/27/2016  . Vitamin D insufficiency 02/11/2016  . De Quervain's tenosynovitis, left 02/09/2016  . Vitamin B12 deficiency 10/17/2015  . Allergic rhinitis 12/29/2014  . Current smoker 11/22/2014  . Obesity 06/29/2014  . Spinal stenosis in cervical region 02/22/2014  . Fibromyalgia 07/15/2009    Silvestre Mesi 02/10/2019, 12:16 PM  [facility] [address] [place], [place], [address] Phone: [phone]   Fax:  [phone]  Name: Caitlin Valdez MRN: [id number] Date of Birth: [birth year]

## 2019-02-18 ENCOUNTER — Ambulatory Visit: Payer: BC Managed Care – PPO | Admitting: Physical Therapy

## 2019-02-23 ENCOUNTER — Ambulatory Visit: Payer: BC Managed Care – PPO | Attending: Orthopedic Surgery | Admitting: Physical Therapy

## 2019-02-23 ENCOUNTER — Telehealth: Payer: Self-pay | Admitting: Physical Therapy

## 2019-02-23 DIAGNOSIS — G8929 Other chronic pain: Secondary | ICD-10-CM | POA: Insufficient documentation

## 2019-02-23 DIAGNOSIS — M5441 Lumbago with sciatica, right side: Secondary | ICD-10-CM | POA: Insufficient documentation

## 2019-02-23 DIAGNOSIS — M6281 Muscle weakness (generalized): Secondary | ICD-10-CM | POA: Insufficient documentation

## 2019-02-23 DIAGNOSIS — R2689 Other abnormalities of gait and mobility: Secondary | ICD-10-CM | POA: Insufficient documentation

## 2019-02-23 NOTE — Telephone Encounter (Signed)
Pt no show for PT appointment today. They where contacted and informed of this by voicemail. They where given date and time of their next appointment time and reminded to call us they can't make their appointment.  Elsie Ra, PT, DPT 02/23/19 11:47 AM

## 2019-02-25 ENCOUNTER — Ambulatory Visit: Payer: BC Managed Care – PPO | Admitting: Physical Therapy

## 2019-03-03 ENCOUNTER — Telehealth: Payer: Self-pay | Admitting: Orthopedic Surgery

## 2019-03-03 NOTE — Telephone Encounter (Signed)
I called pt and advised that we had written a note for her to be out of work until 03/16/19 to allow for her physical therapy appointments. Could not extend it without follow up. Pt states "do not worry about" says that she has an appt with another office tomorrow and she will speak to them about it.

## 2019-03-03 NOTE — Telephone Encounter (Signed)
Patient called asked if another note can be faxed to [facility] like before. The number to contact patient is [phone]

## 2019-03-09 ENCOUNTER — Ambulatory Visit: Payer: BC Managed Care – PPO | Admitting: Physical Therapy

## 2019-03-09 ENCOUNTER — Telehealth: Payer: Self-pay | Admitting: Physical Therapy

## 2019-03-09 NOTE — Telephone Encounter (Signed)
Pt no show for PT appointment today. They where contacted and informed of this. They where given number to call front office to reschedule, they were reminded of their next PT appointment and reminded of our attendance policy.  Elsie Ra, PT, DPT 03/09/19 4:02 PM

## 2019-03-11 ENCOUNTER — Other Ambulatory Visit: Payer: Self-pay

## 2019-03-11 ENCOUNTER — Ambulatory Visit: Payer: BC Managed Care – PPO | Admitting: Physical Therapy

## 2019-03-11 DIAGNOSIS — M6281 Muscle weakness (generalized): Secondary | ICD-10-CM

## 2019-03-11 DIAGNOSIS — M5441 Lumbago with sciatica, right side: Secondary | ICD-10-CM | POA: Diagnosis present

## 2019-03-11 DIAGNOSIS — R2689 Other abnormalities of gait and mobility: Secondary | ICD-10-CM | POA: Diagnosis present

## 2019-03-11 DIAGNOSIS — G8929 Other chronic pain: Secondary | ICD-10-CM | POA: Diagnosis present

## 2019-03-11 NOTE — Therapy (Signed)
Caitlin Valdez, [place], [address] Phone: [phone]   Fax:  [phone]  Physical Therapy Treatment/Discharge  PHYSICAL THERAPY DISCHARGE SUMMARY  Visits from Start of Care: 4 Current functional level related to goals / functional outcomes: No improvement with PT   Remaining deficits: Leg weakness, back and leg radiculopathy   Education / Equipment: HEP, instructions to return to MD about her continued pain Plan: Patient agrees to discharge.  Patient goals were not met. Patient is being discharged due to meeting the stated rehab goals.  ?????        Patient Details  Name: Caitlin Valdez MRN: [id number] Date of Birth: Jun 18, 1971 Referring Provider (PT): Newt Minion, MD   Encounter Date: 03/11/2019  PT End of Session - 03/11/19 1355    Visit Number  4    Number of Visits  12    Date for PT Re-Evaluation  03/10/19    Authorization Type  BCBS    PT Start Time  1014    PT Stop Time  1100    PT Time Calculation (min)  46 min    Activity Tolerance  Patient tolerated treatment well    Behavior During Therapy  [facility] for tasks assessed/performed       Past Medical History:  Diagnosis Date  . Abdominal pain, right lower quadrant   . Anxiety   . Depression   . Fibromyalgia   . GERD (gastroesophageal reflux disease)   . Migraine   . Obesity   . Peripheral vascular disease (HCC)    RIGHT LEG  . PONV (postoperative nausea and vomiting)   . Stroke ([place])   . Trichomonas 2012    Past Surgical History:  Procedure Laterality Date  . CESAREAN SECTION    . FRACTURE SURGERY    . HYSTEROSCOPY WITH NOVASURE N/A 08/08/2014   Procedure:  NOVASURE;  Surgeon: Emily Filbert, MD;  Location: [facility];  Service: Gynecology;  Laterality: N/A;  . VEIN SURGERY      There were no vitals filed for this visit.  Subjective Assessment - 03/11/19 1352    Subjective  Pt reports PT only helps for short temporary time that  day then the pain comes back worse. She is now having her leg give out on her, she is concerned about returning to work, and she relays numbness and tingling all over.    Pertinent History  PMH:anx,dep,migranes, spinal stenosis in cervical, fibromyalgia, PVD Rt leg    How long can you sit comfortably?  30 min    How long can you stand comfortably?  30 minutes to an hour    How long can you walk comfortably?  an hour    Diagnostic tests  Imaging: radiographs show joint space narrowing of the lumbar spine worse at L5-S1.    Patient Stated Goals  make her back more comfortable    Currently in Pain?  Yes    Pain Score  9     Pain Location  Back    Pain Onset  More than a month ago                       [facility] Adult PT Treatment/Exercise - 03/11/19 0001      Lumbar Exercises: Stretches   Double Knee to Chest Stretch  2 reps;30 seconds      Modalities   Modalities  Traction      Moist Heat Therapy  Number Minutes Moist Heat  15 Minutes    Moist Heat Location  Lumbar Spine      Electrical Stimulation   Electrical Stimulation Location  lumbar    Electrical Stimulation Action  IFC in supine    Electrical Stimulation Parameters  tolerance    Electrical Stimulation Goals  Pain      Traction   Type of Traction  Lumbar    Min (lbs)  50    Max (lbs)  70    Time  20 min pre set program, requested to be finished after 15 min due to making her leg hurt.              PT Education - 03/11/19 1355    Education Details  POC instructions to discharge and return to MD regarding her continued high pain levels    Person(s) Educated  Patient    Methods  Explanation    Comprehension  Verbalized understanding          PT Long Term Goals - 03/11/19 1400      PT LONG TERM GOAL #1   Title  Pt will be I and compliant with HEP. (Target goal for all goals 6 weeks 03/10/19)    Status  Achieved      PT LONG TERM GOAL #2   Title  Pt will improve ROM to [facility] to improve mobility     Status  Achieved      PT LONG TERM GOAL #3   Title  Pt will improve leg strength to at least 5-/5 MMT to improve function    Status  Not Met      PT LONG TERM GOAL #4   Title  Pt will improve FOTO to less than 46% limited to show improved function    Status  Not Met      PT LONG TERM GOAL #5   Title  Pt will report overall pain decreased by 50% and improved pain with prolonged standing    Baseline  9 to 10 pain if standing more than one hour at work    Status  Not Met            Plan - 03/11/19 1357    Clinical Impression Statement  She is not able to make progress with PT, her pain and radiculopathy is getting worse. PT has tried stretching, core strength, manual therapy, modalaties and mechanical traction all which only provide little temporary relief. She now reports her leg gives out on her and she cannot stand very long making work difficult for her. She will be discharged from PT due to lack of symptoms and progression of her weakness. She was encouraged to see MD again for further diagnositic testing regarding her high pain levels. 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    Personal Factors and Comorbidities  Comorbidity 1;Comorbidity 2;Comorbidity 3+    Comorbidities  PMH:anx,dep,migranes, spinal stenosis in cervical, fibromyalgia, PVD Rt leg    Examination-Activity Limitations  Bend;Locomotion Level;Sleep;Squat;Stairs;Lift    Examination-Participation Restrictions  Meal Prep;Cleaning;Community Activity;Driving;Shop;Laundry    Stability/Clinical Decision Making  Evolving/Moderate complexity    Rehab Potential  Good    PT Frequency  2x / week    PT Duration  6 weeks    PT Treatment/Interventions  Cryotherapy;Electrical Stimulation;Iontophoresis 54m/ml Dexamethasone;Moist Heat;Traction;Ultrasound;Gait training;Stair training;Therapeutic activities;Therapeutic exercise;Neuromuscular  re-education;Patient/family education;Manual techniques;Taping;Spinal Manipulations;Joint Manipulations;Dry needling  PT Next Visit Plan  modaltaties for pain, needs gentle stretching, core, and Rt leg strength,    PT Home Exercise Plan  LTR, slump and piriformis stretch, beginner bridge (was modified to pelvic tilts), DKTC on pball, supine clams    Consulted and Agree with Plan of Care  Patient       Patient will benefit from skilled therapeutic intervention in order to improve the following deficits and impairments:  Abnormal gait, Decreased activity tolerance, Decreased endurance, Decreased mobility, Difficulty walking, Decreased range of motion, Increased muscle spasms, Obesity, Pain, Postural dysfunction  Visit Diagnosis: Chronic bilateral low back pain with right-sided sciatica  Muscle weakness (generalized)  Other abnormalities of gait and mobility     Problem List Patient Active Problem List   Diagnosis Date Noted  . Anxiety and depression 03/17/2018  . Hematuria 06/27/2016  . S/P endometrial ablation 06/27/2016  . Lower abdominal pain 06/27/2016  . Migraine without status migrainosus, not intractable 06/27/2016  . Vitamin D insufficiency 02/11/2016  . De Quervain's tenosynovitis, left 02/09/2016  . Vitamin B12 deficiency 10/17/2015  . Allergic rhinitis 12/29/2014  . Current smoker 11/22/2014  . Obesity 06/29/2014  . Spinal stenosis in cervical region 02/22/2014  . Fibromyalgia 07/15/2009    Silvestre Mesi 03/11/2019, 2:01 PM  [facility] [address] [place], [place], [address] Phone: [phone]   Fax:  [phone]  Name: AMARA JUSTEN MRN: [id number] Date of Birth: 1971/05/14

## 2019-03-12 ENCOUNTER — Other Ambulatory Visit: Payer: Self-pay

## 2019-03-12 DIAGNOSIS — M79604 Pain in right leg: Secondary | ICD-10-CM

## 2019-03-16 ENCOUNTER — Ambulatory Visit (HOSPITAL_COMMUNITY)
Admission: RE | Admit: 2019-03-16 | Discharge: 2019-03-16 | Disposition: A | Discharge status: Home / Self Care | Payer: BC Managed Care – PPO | Source: Ambulatory Visit | Attending: Vascular Surgery | Admitting: Vascular Surgery

## 2019-03-16 ENCOUNTER — Other Ambulatory Visit: Payer: Self-pay

## 2019-03-16 ENCOUNTER — Encounter: Payer: Self-pay | Admitting: Vascular Surgery

## 2019-03-16 ENCOUNTER — Ambulatory Visit (INDEPENDENT_AMBULATORY_CARE_PROVIDER_SITE_OTHER): Payer: BC Managed Care – PPO | Admitting: Vascular Surgery

## 2019-03-16 VITALS — BP 114/79 | HR 80 | Temp 97.3°F | Resp 20 | Ht 64.0 in | Wt 184.0 lb

## 2019-03-16 DIAGNOSIS — I83811 Varicose veins of right lower extremities with pain: Secondary | ICD-10-CM

## 2019-03-16 DIAGNOSIS — I83819 Varicose veins of unspecified lower extremities with pain: Secondary | ICD-10-CM

## 2019-03-16 DIAGNOSIS — M79604 Pain in right leg: Secondary | ICD-10-CM

## 2019-03-16 DIAGNOSIS — R600 Localized edema: Secondary | ICD-10-CM

## 2019-03-16 NOTE — Progress Notes (Signed)
[facility] Specialist of [place]  Patient name: Caitlin Valdez MRN: [id number] DOB: 1971/01/07 Sex: female  REASON FOR CONSULT: Evaluation bilateral lower extremity edema right greater than left  HPI: Caitlin Valdez is a 48 y.o. female, who is here today for evaluation.  Concern regarding swelling in her right leg greater than her left.  She also reports symptoms of burning and numbness.  Reports the numbness is noted particularly when she sits on the commode.  Does have a diagnosis of fibromyalgia and had a history of a severe orthopedic trauma as a child requiring pinning and body cast for months.  He does not have any history of arterial insufficiency.  No history of cardiac difficulty.  Past Medical History:  Diagnosis Date  . Abdominal pain, right lower quadrant   . Anxiety   . Depression   . Fibromyalgia   . GERD (gastroesophageal reflux disease)   . Migraine   . Obesity   . Peripheral vascular disease (HCC)    RIGHT LEG  . PONV (postoperative nausea and vomiting)   . Stroke ([place])   . Trichomonas 2012    Family History  Problem Relation Age of Onset  . Diabetes Mother   . Hypertension Mother   . Breast cancer Mother 68  . Diabetes Father   . Hypertension Father   . Diabetes Other   . Hypertension Other   . Other Neg Hx     SOCIAL HISTORY: Social History   Socioeconomic History  . Marital status: Single    Spouse name: Not on file  . Number of children: Not on file  . Years of education: Not on file  . Highest education level: Not on file  Occupational History  . Not on file  Social Needs  . Financial resource strain: Not on file  . Food insecurity    Worry: Not on file    Inability: Not on file  . Transportation needs    Medical: Not on file    Non-medical: Not on file  Tobacco Use  . Smoking status: Current Every Day Smoker    Packs/day: 1.00    Years: 5.00    Pack years: 5.00    Types: Cigarettes  .  Smokeless tobacco: Never Used  Substance and Sexual Activity  . Alcohol use: No  . Drug use: No  . Sexual activity: Yes    Birth control/protection: Condom  Lifestyle  . Physical activity    Days per week: Not on file    Minutes per session: Not on file  . Stress: Not on file  Relationships  . Social Herbalist on phone: Not on file    Gets together: Not on file    Attends religious service: Not on file    Active member of club or organization: Not on file    Attends meetings of clubs or organizations: Not on file    Relationship status: Not on file  . Intimate partner violence    Fear of current or ex partner: Not on file    Emotionally abused: Not on file    Physically abused: Not on file    Forced sexual activity: Not on file  Other Topics Concern  . Not on file  Social History Narrative  . Not on file    No Known Allergies  Current Outpatient Medications  Medication Sig Dispense Refill  . aspirin-acetaminophen-caffeine (EXCEDRIN MIGRAINE) 250-250-65 MG tablet Take 2 tablets by mouth every 6 (  six) hours as needed for headache.    . butalbital-acetaminophen-caffeine (FIORICET) 50-325-40 MG tablet Take 1 tablet by mouth every 6 (six) hours as needed for headache. 30 tablet 1  . cetirizine (ZYRTEC) 10 MG tablet Take 1 tablet (10 mg total) by mouth at bedtime. To help with congestion 30 tablet 1  . DULoxetine HCl 40 MG CPEP Take 1 capsule by mouth daily.    [name] gabapentin (NEURONTIN) 300 MG capsule TAKE 1 CAPSULE BY MOUTH EVERY MORNING AND 1 AT NOON AND 2 CAPSULES AT BEDTIME    . Misc. Devices MISC Please provide patient with insurance approved custom compression socks for PVD. 1 each 0  . omeprazole (PRILOSEC) 40 MG capsule Take 1 capsule (40 mg total) by mouth daily. 30 capsule 3  . oxyCODONE (OXY IR/ROXICODONE) 5 MG immediate release tablet TAKE 1 TABLET BY MOUTH ONLY AS NEEDED TWICE DAILY FOR NEURALGIA PAIN IN EXCESS OF GABAPENTIN     No current  facility-administered medications for this visit.     REVIEW OF SYSTEMS:  [X]  denotes positive finding, [ ]  denotes negative finding Cardiac  Comments:  Chest pain or chest pressure:    Shortness of breath upon exertion:    Short of breath when lying flat:    Irregular heart rhythm:        Vascular    Pain in calf, thigh, or hip brought on by ambulation: x   Pain in feet at night that wakes you up from your sleep:  x   Blood clot in your veins:    Leg swelling:  x       Pulmonary    Oxygen at home:    Productive cough:     Wheezing:         Neurologic    Sudden weakness in arms or legs:     Sudden numbness in arms or legs:     Sudden onset of difficulty speaking or slurred speech:    Temporary loss of vision in one eye:     Problems with dizziness:         Gastrointestinal    Blood in stool:     Vomited blood:         Genitourinary    Burning when urinating:     Blood in urine:        Psychiatric    Major depression:         Hematologic    Bleeding problems:    Problems with blood clotting too easily:        Skin    Rashes or ulcers:        Constitutional    Fever or chills:      PHYSICAL EXAM: Vitals:   03/16/19 1333  BP: 114/79  Pulse: 80  Resp: 20  Temp: (!) 97.3 F (36.3 C)  SpO2: 100%  Weight: 83.5 kg  Height: 5\' 4"  (1.626 m)    GENERAL: The patient is a well-nourished female, in no acute distress. The vital signs are documented above. CARDIOVASCULAR: 2+ radial and 2+ dorsalis pedis pulses bilaterally PULMONARY: There is good air exchange  ABDOMEN: Soft and non-tender  MUSCULOSKELETAL: There are no major deformities or cyanosis. NEUROLOGIC: No focal weakness or paresthesias are detected. SKIN: There are no ulcers or rashes noted.  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  DATA:  Formal  right leg venous  duplex showed no evidence of DVT.  No significant deep or superficial venous reflux.  Mild reflux on the right near the saphenofemoral junction with no dilatation of the great saphenous vein  I did image her left leg as well and this showed her saphenous vein to be small with no evidence of reflux  MEDICAL ISSUES: I discussed the significance of this with the patient.  I feel that her symptoms mainly are neurogenic.  She does have numbness sensation that can be positional.  She has normal arterial exam.  I did recommend elevation when possible for the swelling.  Not see any correctable venous hypertension.  She was reassured with this discussion will see [place] again on an as-needed basis   Rosetta Posner, MD [facility] of [facility] Tel [phone] Pager [phone]

## 2019-03-18 ENCOUNTER — Other Ambulatory Visit: Payer: Self-pay

## 2019-03-18 ENCOUNTER — Telehealth: Payer: Self-pay | Admitting: Orthopedic Surgery

## 2019-03-18 NOTE — Telephone Encounter (Signed)
Printed letter that had been given in July that pt could return on 03/16/19 regular fully time duty

## 2019-03-18 NOTE — Telephone Encounter (Signed)
Patient came into the clinic to request a RTW note.  She stated PT did not help, but needs to return to work for financial reasons.  CB#[phone]

## 2019-03-23 ENCOUNTER — Telehealth: Payer: Self-pay | Admitting: *Deleted

## 2019-03-23 NOTE — Telephone Encounter (Signed)
She will need to contact her Orthopedic doctor- she may have to schedule a follow-up appointment with Ortho regarding her work/lifting restrictions or see if physical therapy may be able to address her limitations.   What issues is she having regarding the need for flagyl?

## 2019-03-23 NOTE — Telephone Encounter (Signed)
Patient is requesting a letter for no excessive lifting... and patient needs a prescription for BV ASAP

## 2019-03-23 NOTE — Telephone Encounter (Signed)
Pt called stating she is needing a note for her job saying she can not be picking up any heavy things (per pt they are wanting her to be picking up boxes). Per pt she just need a restriction note.   Per pt she is needing refills for Flagyl.

## 2019-03-24 ENCOUNTER — Other Ambulatory Visit: Payer: Self-pay | Admitting: Family Medicine

## 2019-03-24 MED ORDER — METRONIDAZOLE 500 MG PO TABS: 500.0000 mg | ORAL_TABLET | Freq: Two times a day (BID) | ORAL | 0 refills | Status: AC

## 2019-03-24 NOTE — Telephone Encounter (Signed)
Informed patient with what provider stated and she verbalized understanding.  

## 2019-03-24 NOTE — Progress Notes (Signed)
Patient ID: Caitlin Valdez, female   DOB: [birth year], 48 y.o.   MRN: [id number]     Per CMA, patient requesting refill of metronidazole due to vaginal discharge. I will send in RX but patient will be notified that if she is having pelvic/vaginal pain or abdominal pain or continued discharge then she needs to go to urgent care or schedule office visit

## 2019-03-24 NOTE — Telephone Encounter (Signed)
Please let patient know that a prescription was sent to [facility] for metronidazole/Flagyl.  If patient is having continued vaginal discharge or if she is having any abdominal/pelvic or vaginal pain then she should go to urgent care/emergency department for further evaluation.

## 2019-03-24 NOTE — Telephone Encounter (Signed)
Per pt she is having discharge and would like the Flagyl refilled.

## 2019-04-05 ENCOUNTER — Telehealth: Payer: Self-pay | Admitting: Orthopedic Surgery

## 2019-04-05 NOTE — Telephone Encounter (Signed)
Can you please call pt and make an appt for eval?

## 2019-04-05 NOTE — Telephone Encounter (Signed)
Pt called in requesting a call back from dr.duda or autumn. Pt said she is concerned that she was sent back to work too soon she is hurting a lot and can barely sleep at night.   [phone]

## 2019-04-06 ENCOUNTER — Encounter: Payer: Self-pay | Admitting: Family

## 2019-04-06 ENCOUNTER — Ambulatory Visit (INDEPENDENT_AMBULATORY_CARE_PROVIDER_SITE_OTHER): Payer: BC Managed Care – PPO | Admitting: Family

## 2019-04-06 ENCOUNTER — Ambulatory Visit: Payer: Self-pay

## 2019-04-06 ENCOUNTER — Other Ambulatory Visit: Payer: Self-pay

## 2019-04-06 VITALS — Ht 64.0 in | Wt 184.0 lb

## 2019-04-06 DIAGNOSIS — M546 Pain in thoracic spine: Secondary | ICD-10-CM | POA: Diagnosis not present

## 2019-04-06 DIAGNOSIS — M5441 Lumbago with sciatica, right side: Secondary | ICD-10-CM

## 2019-04-06 DIAGNOSIS — M25562 Pain in left knee: Secondary | ICD-10-CM

## 2019-04-06 DIAGNOSIS — G8929 Other chronic pain: Secondary | ICD-10-CM

## 2019-04-06 DIAGNOSIS — M545 Low back pain, unspecified: Secondary | ICD-10-CM

## 2019-04-06 NOTE — Progress Notes (Signed)
Office Visit Note   Patient: Caitlin Valdez           Date of Birth: 1971-06-16           MRN: [id number] Visit Date: 04/06/2019              Requested by: Antony Blackbird, MD [place],  [place] [address] PCP: Antony Blackbird, MD  Chief Complaint  Patient presents with  . Lower Back - Pain  . Left Knee - Pain      HPI: The patient is a 48 year old woman who presents for complaining of worsening of her chronic low back pain.  She states she cannot get up out of bed due to shooting pains in her thoracic right-sided back as well as band type pain around her low back.  She has not been able to complete her MRI yet.  Does have an appointment scheduled for October 2 to get set up with pain management.  She wonders if the back pain is worse or may have reinjured her back she had a fall in her bathtub last month she fell directly on her knee did not fall on her back.  She has had knee pain since.  The knee pain is a new problem she is having global knee pain this is anterior as well as deep knee pain no mechanical symptoms no locking catching no giving way no crepitation.  She continues having shooting pain down bilateral legs this feels like fire on her right leg it is in her posterior thigh and anterior shin  Of note she does work at [employer] as a Scientist, water quality and she had to return to work from previous back issues a week ago worked for 1 week and felt she had quite a lot of difficulty moving customers bags as she had to twist and turn her back to past the bags and the items down the belt.   Does relate she has been exercising 3 times daily without issue.  Has gotten a knee brace which does not provide her any relief is taking Tylenol and ibuprofen without relief.  Assessment & Plan: Visit Diagnoses:  1. Acute right-sided thoracic back pain   2. Chronic right-sided low back pain with right-sided sciatica   3. Left knee pain, unspecified chronicity   4. Low back pain,  unspecified back pain laterality, unspecified chronicity, unspecified whether sciatica present     Plan: Have discussed with the patient the importance of proceeding with MRI to further investigate her low back pain issues before we can do any interventions.  The thoracic pain she is having is likely a muscle strain.  As well as her knee pain she is point tender over the medial collateral ligament and does have some tenderness pain with stressing, MCL sprain.  Follow-Up Instructions: Return in about 4 weeks (around 05/04/2019), or if symptoms worsen or fail to improve.   Right Knee Exam   Muscle Strength  The patient has normal right knee strength.  Tenderness  The patient is experiencing tenderness in the medial joint line (global).  Tests  Varus: negative Valgus: negative Drawer:  Anterior - negative    Posterior - negative  Other  Erythema: absent Swelling: mild   Back Exam   Tenderness  The patient is experiencing tenderness in the lumbar.  Range of Motion  The patient has normal back ROM.  Muscle Strength  The patient has normal back strength.  Tests  Straight leg raise right: negative  Straight leg raise left: negative  Other  Gait: normal       Patient is alert, oriented, no adenopathy, well-dressed, normal affect, normal respiratory effort. Right sided thoracic paraspinal tenderness  Imaging: No results found. No images are attached to the encounter.  Labs: Lab Results  Component Value Date   HGBA1C 5.4 03/18/2018   HGBA1C 5.7 12/17/2016   HGBA1C 5.2 12/23/2013   ESRSEDRATE 6 06/22/2013   LABURIC 4.7 06/22/2013   REPTSTATUS 03/20/2018 FINAL 03/17/2018   GRAMSTAIN  03/17/2018    MODERATE WBC PRESENT, PREDOMINANTLY PMN RARE GRAM POSITIVE COCCI Performed at [facility], [place] [address], [place], [place] [address]    CULT FEW METHICILLIN RESISTANT STAPHYLOCOCCUS AUREUS 03/17/2018   LABORGA METHICILLIN RESISTANT STAPHYLOCOCCUS AUREUS  03/17/2018     Lab Results  Component Value Date   ALBUMIN 4.0 04/02/2018   ALBUMIN 3.0 (L) 03/18/2018   ALBUMIN 3.2 (L) 03/17/2018   LABURIC 4.7 06/22/2013    Lab Results  Component Value Date   MG 2.0 03/18/2018   Lab Results  Component Value Date   VD25OH 25 (L) 02/09/2016   VD25OH 14 (L) 10/16/2015   VD25OH 25 (L) 03/31/2015    No results found for: PREALBUMIN CBC EXTENDED Latest Ref Rng & Units 04/02/2018 03/18/2018 03/17/2018  WBC 4.0 - 10.5 K/uL 7.3 10.8(H) 10.9(H)  RBC 3.87 - 5.11 MIL/uL 4.30 3.92 4.08  HGB 12.0 - 15.0 g/dL 14.2 12.7 13.4  HCT 36.0 - 46.0 % 41.0 38.0 39.5  PLT 150 - 400 K/uL 290 296 271  NEUTROABS 1.7 - 7.7 K/uL 2.4 - 6.3  LYMPHSABS 0.7 - 4.0 K/uL 4.3(H) - 3.5     Body mass index is 31.58 kg/m.  Orders:  Orders Placed This Encounter  Procedures  . XR Knee 1-2 Views Left  . XR Lumbar Spine 2-3 Views   No orders of the defined types were placed in this encounter.    Procedures: No procedures performed  Clinical Data: No additional findings.  ROS:  All other systems negative, except as noted in the HPI. Review of Systems  Constitutional: Negative for chills and fever.  Musculoskeletal: Positive for arthralgias, back pain and myalgias.  Neurological: Positive for numbness. Negative for weakness.    Objective: Vital Signs: Ht 5\' 4"  (1.626 m)   Wt 184 lb (83.5 kg)   BMI 31.58 kg/m   Specialty Comments:  No specialty comments available.  PMFS History: Patient Active Problem List   Diagnosis Date Noted  . Anxiety and depression 03/17/2018  . Hematuria 06/27/2016  . S/P endometrial ablation 06/27/2016  . Lower abdominal pain 06/27/2016  . Migraine without status migrainosus, not intractable 06/27/2016  . Vitamin D insufficiency 02/11/2016  . De Quervain's tenosynovitis, left 02/09/2016  . Vitamin B12 deficiency 10/17/2015  . Allergic rhinitis 12/29/2014  . Current smoker 11/22/2014  . Obesity 06/29/2014  . Spinal stenosis  in cervical region 02/22/2014  . Fibromyalgia 07/15/2009   Past Medical History:  Diagnosis Date  . Abdominal pain, right lower quadrant   . Anxiety   . Depression   . Fibromyalgia   . GERD (gastroesophageal reflux disease)   . Migraine   . Obesity   . Peripheral vascular disease (HCC)    RIGHT LEG  . PONV (postoperative nausea and vomiting)   . Stroke ([place])   . Trichomonas 2012    Family History  Problem Relation Age of Onset  . Diabetes Mother   . Hypertension Mother   .  Breast cancer Mother 7  . Diabetes Father   . Hypertension Father   . Diabetes Other   . Hypertension Other   . Other Neg Hx     Past Surgical History:  Procedure Laterality Date  . CESAREAN SECTION    . FRACTURE SURGERY    . HYSTEROSCOPY WITH NOVASURE N/A 08/08/2014   Procedure:  NOVASURE;  Surgeon: Emily Filbert, MD;  Location: [facility];  Service: Gynecology;  Laterality: N/A;  . VEIN SURGERY     Social History   Occupational History  . Not on file  Tobacco Use  . Smoking status: Current Every Day Smoker    Packs/day: 1.00    Years: 5.00    Pack years: 5.00    Types: Cigarettes  . Smokeless tobacco: Never Used  Substance and Sexual Activity  . Alcohol use: No  . Drug use: No  . Sexual activity: Yes    Birth control/protection: Condom

## 2019-04-07 ENCOUNTER — Ambulatory Visit: Payer: BC Managed Care – PPO | Admitting: Family

## 2019-04-08 ENCOUNTER — Ambulatory Visit: Payer: BC Managed Care – PPO | Admitting: Family Medicine

## 2019-04-12 ENCOUNTER — Telehealth: Payer: Self-pay | Admitting: *Deleted

## 2019-04-12 NOTE — Telephone Encounter (Signed)
Order has been resubmitted and sent to [facility].

## 2019-04-12 NOTE — Telephone Encounter (Signed)
-----   Message from Suzan Slick, NP sent at 04/06/2019  1:33 PM EDT ----- Do you mind setting up her lumbar mri

## 2019-04-26 ENCOUNTER — Other Ambulatory Visit: Payer: Self-pay | Admitting: Orthopedic Surgery

## 2019-04-29 ENCOUNTER — Other Ambulatory Visit: Payer: Self-pay

## 2019-04-29 ENCOUNTER — Other Ambulatory Visit (HOSPITAL_COMMUNITY)
Admission: RE | Admit: 2019-04-29 | Discharge: 2019-04-29 | Disposition: A | Discharge status: Home / Self Care | Payer: BC Managed Care – PPO | Source: Ambulatory Visit | Attending: Family Medicine | Admitting: Family Medicine

## 2019-04-29 ENCOUNTER — Encounter: Payer: Self-pay | Admitting: Family Medicine

## 2019-04-29 ENCOUNTER — Ambulatory Visit (HOSPITAL_BASED_OUTPATIENT_CLINIC_OR_DEPARTMENT_OTHER): Payer: BC Managed Care – PPO | Admitting: Family Medicine

## 2019-04-29 VITALS — BP 135/90 | HR 81 | Ht 64.0 in | Wt 185.6 lb

## 2019-04-29 DIAGNOSIS — F1190 Opioid use, unspecified, uncomplicated: Secondary | ICD-10-CM

## 2019-04-29 DIAGNOSIS — Z113 Encounter for screening for infections with a predominantly sexual mode of transmission: Secondary | ICD-10-CM | POA: Insufficient documentation

## 2019-04-29 DIAGNOSIS — N760 Acute vaginitis: Secondary | ICD-10-CM | POA: Diagnosis not present

## 2019-04-29 DIAGNOSIS — A549 Gonococcal infection, unspecified: Secondary | ICD-10-CM | POA: Insufficient documentation

## 2019-04-29 DIAGNOSIS — R0981 Nasal congestion: Secondary | ICD-10-CM

## 2019-04-29 DIAGNOSIS — M2550 Pain in unspecified joint: Secondary | ICD-10-CM | POA: Diagnosis not present

## 2019-04-29 DIAGNOSIS — K5900 Constipation, unspecified: Secondary | ICD-10-CM | POA: Diagnosis not present

## 2019-04-29 DIAGNOSIS — R1084 Generalized abdominal pain: Secondary | ICD-10-CM | POA: Diagnosis not present

## 2019-04-29 DIAGNOSIS — B9689 Other specified bacterial agents as the cause of diseases classified elsewhere: Secondary | ICD-10-CM | POA: Diagnosis not present

## 2019-04-29 MED ORDER — POLYETHYLENE GLYCOL 3350 17 GM/SCOOP PO POWD: ORAL | 99 refills | Status: DC

## 2019-04-29 NOTE — Progress Notes (Signed)
Established Patient Office Visit  Subjective:  Patient ID: Caitlin Valdez, female    DOB: [birth year]  Age: 48 y.o. MRN: [id number]  CC: No chief complaint on file.   HPI Caitlin Valdez, 48 year old African-American female, he states that she continues to have issues with low back pain, multiple joint pain and leg pain but physical therapist decided that she could return to work.  Patient states that she is attending pain management and is currently on oxycodone for her back and leg pain issues.  Patient states that she is not sure how long she can continue to work with her chronic pain.          Patient also reports that she is having issues with generalized abdominal pain.  She states that she is having constipation which she thinks is the cause of her abdominal pain.  She has some occasional nausea.  She also has history of acid reflux.  She denies any backwash of fluid into her throat, no sore throat, no difficulty swallowing.  She has seen no blood in the stool and no black stools but stools are hard and she sometimes has to strain to have a bowel movement.           She also needs refill of medication for allergic rhinitis.  She also reports history of migraine type headaches.  Patient also per CMA reports possible exposure to sexually transmitted infection for which she would like testing at today's visit.  Past Medical History:  Diagnosis Date  . Abdominal pain, right lower quadrant   . Anxiety   . Depression   . Fibromyalgia   . GERD (gastroesophageal reflux disease)   . Migraine   . Obesity   . Peripheral vascular disease (HCC)    RIGHT LEG  . PONV (postoperative nausea and vomiting)   . Stroke ([place])   . Trichomonas 2012    Past Surgical History:  Procedure Laterality Date  . CESAREAN SECTION    . FRACTURE SURGERY    . HYSTEROSCOPY WITH NOVASURE N/A 08/08/2014   Procedure:  NOVASURE;  Surgeon: Emily Filbert, MD;  Location: [facility];  Service: Gynecology;  Laterality: N/A;  .  VEIN SURGERY      Family History  Problem Relation Age of Onset  . Diabetes Mother   . Hypertension Mother   . Breast cancer Mother 52  . Diabetes Father   . Hypertension Father   . Diabetes Other   . Hypertension Other   . Other Neg Hx     Social History   Socioeconomic History  . Marital status: Single    Spouse name: Not on file  . Number of children: Not on file  . Years of education: Not on file  . Highest education level: Not on file  Occupational History  . Not on file  Social Needs  . Financial resource strain: Not on file  . Food insecurity    Worry: Not on file    Inability: Not on file  . Transportation needs    Medical: Not on file    Non-medical: Not on file  Tobacco Use  . Smoking status: Current Every Day Smoker    Packs/day: 1.00    Years: 5.00    Pack years: 5.00    Types: Cigarettes  . Smokeless tobacco: Never Used  Substance and Sexual Activity  . Alcohol use: No  . Drug use: No  . Sexual activity: Yes    Birth control/protection: Condom  Lifestyle  . Physical activity    Days per week: Not on file    Minutes per session: Not on file  . Stress: Not on file  Relationships  . Social Herbalist on phone: Not on file    Gets together: Not on file    Attends religious service: Not on file    Active member of club or organization: Not on file    Attends meetings of clubs or organizations: Not on file    Relationship status: Not on file  . Intimate partner violence    Fear of current or ex partner: Not on file    Emotionally abused: Not on file    Physically abused: Not on file    Forced sexual activity: Not on file  Other Topics Concern  . Not on file  Social History Narrative  . Not on file    Outpatient Medications Prior to Visit  Medication Sig Dispense Refill  . aspirin-acetaminophen-caffeine (EXCEDRIN MIGRAINE) 250-250-65 MG tablet Take 2 tablets by mouth every 6 (six) hours as needed for headache.    .  butalbital-acetaminophen-caffeine (FIORICET) 50-325-40 MG tablet Take 1 tablet by mouth every 6 (six) hours as needed for headache. 30 tablet 1  . cetirizine (ZYRTEC) 10 MG tablet Take 1 tablet (10 mg total) by mouth at bedtime. To help with congestion 30 tablet 1  . DULoxetine HCl 40 MG CPEP Take 1 capsule by mouth daily.    [name] gabapentin (NEURONTIN) 300 MG capsule TAKE 1 CAPSULE BY MOUTH EVERY MORNING AND 1 AT NOON AND 2 CAPSULES AT BEDTIME    . Misc. Devices MISC Please provide patient with insurance approved custom compression socks for PVD. 1 each 0  . omeprazole (PRILOSEC) 40 MG capsule Take 1 capsule (40 mg total) by mouth daily. 30 capsule 3  . oxyCODONE (OXY IR/ROXICODONE) 5 MG immediate release tablet TAKE 1 TABLET BY MOUTH ONLY AS NEEDED TWICE DAILY FOR NEURALGIA PAIN IN EXCESS OF GABAPENTIN     No facility-administered medications prior to visit.     No Known Allergies  ROS Review of Systems  Constitutional: Positive for fatigue. Negative for chills and fever.  HENT: Positive for congestion. Negative for sinus pressure, sinus pain, sore throat and trouble swallowing.   Eyes: Negative for photophobia and visual disturbance.  Respiratory: Negative for cough and shortness of breath.   Gastrointestinal: Positive for abdominal pain, constipation and nausea (Occasional). Negative for blood in stool and diarrhea.  Endocrine: Negative for cold intolerance, heat intolerance, polydipsia, polyphagia and polyuria.  Genitourinary: Negative for dysuria, frequency, vaginal discharge and vaginal pain.  Musculoskeletal: Positive for arthralgias, back pain, gait problem and myalgias.  Skin: Negative for rash and wound.  Neurological: Positive for headaches. Negative for dizziness.  Hematological: Negative for adenopathy. Does not bruise/bleed easily.      Objective:    Physical Exam  Constitutional: She is oriented to person, place, and time.  Well-nourished well-developed overweight female  in no acute distress sitting on exam table.  Patient is wearing mask as per office COVID-19 precautions  Neck: Normal range of motion. Neck supple. No JVD present.  Cardiovascular: Normal rate and regular rhythm.  Pulmonary/Chest: Effort normal and breath sounds normal.  Abdominal: Soft. There is abdominal tenderness (Abdomen is soft, patient with complaint of generalized abdominal discomfort with palpation, no rebound or guarding). There is no rebound and no guarding.  Musculoskeletal:        General: Tenderness present. No edema.  Comments: No CVA tenderness, patient with complaint of lumbosacral discomfort to palpation and mild lumbar paraspinous spasm  Lymphadenopathy:    She has no cervical adenopathy.  Neurological: She is alert and oriented to person, place, and time. No cranial nerve deficit.  Skin: Skin is warm and dry.  Psychiatric: She has a normal mood and affect. Her behavior is normal.  Nursing note and vitals reviewed.   BP 135/90 (BP Location: Right Arm, Patient Position: Sitting, Cuff Size: Large)   Pulse 81   Ht 5\' 4"  (1.626 m)   Wt 185 lb 9.6 oz (84.2 kg)   SpO2 100%   BMI 31.86 kg/m  Wt Readings from Last 3 Encounters:  04/06/19 184 lb (83.5 kg)  03/16/19 184 lb (83.5 kg)  12/31/18 187 lb (84.8 kg)     Health Maintenance Due  Topic Date Due  . INFLUENZA VACCINE  02/13/2019    Patient was offered but declined influenza immunization at today's visit  Lab Results  Component Value Date   TSH 3.297 03/17/2018   Lab Results  Component Value Date   WBC 7.3 04/02/2018   HGB 14.2 04/02/2018   HCT 41.0 04/02/2018   MCV 95.3 04/02/2018   PLT 290 04/02/2018   Lab Results  Component Value Date   NA 139 04/02/2018   K 4.1 04/02/2018   CO2 20 (L) 04/02/2018   GLUCOSE 81 04/02/2018   BUN 9 04/02/2018   CREATININE 0.87 04/02/2018   BILITOT 0.6 04/02/2018   ALKPHOS 59 04/02/2018   AST 16 04/02/2018   ALT 19 04/02/2018   PROT 7.5 04/02/2018   ALBUMIN  4.0 04/02/2018   CALCIUM 9.5 04/02/2018   ANIONGAP 10 04/02/2018   Lab Results  Component Value Date   CHOL 181 12/23/2013   Lab Results  Component Value Date   HDL 75 12/23/2013   Lab Results  Component Value Date   LDLCALC 85 12/23/2013   Lab Results  Component Value Date   TRIG 103 12/23/2013   Lab Results  Component Value Date   CHOLHDL 2.4 12/23/2013   Lab Results  Component Value Date   HGBA1C 5.4 03/18/2018      Assessment & Plan:  1. Generalized abdominal pain Patient with complaint of generalized abdominal pain and issues with constipation.  Patient likely with opioid-induced constipation as she is now on oxycodone.  Patient however will also be referred to gastroenterology for further evaluation.  Patient will have comprehensive metabolic panel to look for electrolyte or liver abnormality as well as CBC to look for anemia which may be related to blood loss or elevated white blood cell count indicative of infection. - Comprehensive metabolic panel - CBC with Differential - Ambulatory referral to Gastroenterology  2. Constipation, unspecified constipation type Patient with complaint of constipation and generalized abdominal pain.  Patient will be prescribed MiraLAX to take once daily and is also advised to increase fiber in her diet and increase water intake.  She is currently on opioid pain medication and this is likely causing or contributing to her issues with constipation.  We will also check comprehensive metabolic panel as well as TSH to look for hyperthyroidism as a possible factor in her constipation and she will be referred to gastroenterology for further evaluation and treatment - Comprehensive metabolic panel - TSH - Ambulatory referral to Gastroenterology  3. Multiple joint pain Patient has had issues with back pain and leg pain for which she has been seen and evaluated by orthopedics, vascular  surgery and has undergone physical therapy.  She complains  however continued multiple joint pain despite being on chronic opioid therapy.  Will check arthritis panel to look for connective tissue disorder as possible contributing factor to her chronic pain. - Arthritis Panel  4. Opioid use Patient reports that she is currently on oxycodone for chronic pain through pain management.  Discussed with patient that this can increase her risk of constipation and may be the cause of her current issues.  Prescription was provided for MiraLAX to help with constipation.  Patient was also advised to make sure that she receives prescription for Narcan from pain management in case of accidental overdose of medication.  Patient also needs to make sure that medication does not affect her ability to drive.  5. Nasal congestion Refill was sent to patient's pharmacy for cetirizine  6. Routine screening for STI (sexually transmitted infection) Per CMA, patient had requested screening for sexually transmitted infections.  Patient performed self swab for cervicovaginal ancillary testing for gonorrhea, chlamydia, trichomonas, bacterial vaginitis and candidal infection.  Patient will also have HIV blood testing.  Patient will be notified if any further treatment is needed based on the results - Cervicovaginal ancillary only - HIV antibody (with reflex)   An After Visit Summary was printed and given to the patient.  Follow-up: Return for as needed and 4 months.   Antony Blackbird, MD

## 2019-04-29 NOTE — Progress Notes (Signed)
Exposure to STD Headaches Med refill

## 2019-04-29 NOTE — Patient Instructions (Signed)

## 2019-04-30 ENCOUNTER — Telehealth: Payer: Self-pay | Admitting: Family Medicine

## 2019-04-30 ENCOUNTER — Ambulatory Visit
Admission: RE | Admit: 2019-04-30 | Discharge: 2019-04-30 | Disposition: A | Discharge status: Home / Self Care | Payer: BC Managed Care – PPO | Source: Ambulatory Visit | Attending: Orthopedic Surgery | Admitting: Orthopedic Surgery

## 2019-04-30 DIAGNOSIS — G8929 Other chronic pain: Secondary | ICD-10-CM

## 2019-04-30 DIAGNOSIS — M5441 Lumbago with sciatica, right side: Secondary | ICD-10-CM

## 2019-04-30 LAB — COMPREHENSIVE METABOLIC PANEL WITH GFR
ALT: 19 IU/L (ref 0–32)
AST: 16 IU/L (ref 0–40)
Albumin/Globulin Ratio: 1.6 (ref 1.2–2.2)
Albumin: 4.4 g/dL (ref 3.8–4.8)
Alkaline Phosphatase: 84 IU/L (ref 39–117)
BUN/Creatinine Ratio: 9 (ref 9–23)
BUN: 8 mg/dL (ref 6–24)
Bilirubin Total: 0.2 mg/dL (ref 0.0–1.2)
CO2: 24 mmol/L (ref 20–29)
Calcium: 9.5 mg/dL (ref 8.7–10.2)
Chloride: 104 mmol/L (ref 96–106)
Creatinine, Ser: 0.85 mg/dL (ref 0.57–1.00)
GFR calc Af Amer: 94 mL/min/1.73
GFR calc non Af Amer: 82 mL/min/1.73
Globulin, Total: 2.7 g/dL (ref 1.5–4.5)
Glucose: 87 mg/dL (ref 65–99)
Potassium: 4.3 mmol/L (ref 3.5–5.2)
Sodium: 138 mmol/L (ref 134–144)
Total Protein: 7.1 g/dL (ref 6.0–8.5)

## 2019-04-30 LAB — CBC WITH DIFFERENTIAL/PLATELET
Basophils Absolute: 0 x10E3/uL (ref 0.0–0.2)
Basos: 0 %
EOS (ABSOLUTE): 0.1 x10E3/uL (ref 0.0–0.4)
Eos: 1 %
Hematocrit: 39.2 % (ref 34.0–46.6)
Hemoglobin: 13.8 g/dL (ref 11.1–15.9)
Immature Grans (Abs): 0 x10E3/uL (ref 0.0–0.1)
Immature Granulocytes: 0 %
Lymphocytes Absolute: 4.8 x10E3/uL — ABNORMAL HIGH (ref 0.7–3.1)
Lymphs: 55 %
MCH: 32.5 pg (ref 26.6–33.0)
MCHC: 35.2 g/dL (ref 31.5–35.7)
MCV: 93 fL (ref 79–97)
Monocytes Absolute: 0.6 x10E3/uL (ref 0.1–0.9)
Monocytes: 7 %
Neutrophils Absolute: 3.2 x10E3/uL (ref 1.4–7.0)
Neutrophils: 37 %
Platelets: 260 x10E3/uL (ref 150–450)
RBC: 4.24 x10E6/uL (ref 3.77–5.28)
RDW: 11.7 % (ref 11.7–15.4)
WBC: 8.7 x10E3/uL (ref 3.4–10.8)

## 2019-04-30 LAB — TSH: TSH: 0.78 u[IU]/mL (ref 0.450–4.500)

## 2019-04-30 LAB — ARTHRITIS PANEL
Anti Nuclear Antibody (ANA): NEGATIVE
Rheumatoid fact SerPl-aCnc: <10 [IU]/mL (ref 0.0–13.9)
Sed Rate: 14 mm/h (ref 0–32)
Uric Acid: 4.8 mg/dL (ref 2.5–7.1)

## 2019-04-30 LAB — HIV ANTIBODY (ROUTINE TESTING W REFLEX): HIV Screen 4th Generation wRfx: NONREACTIVE

## 2019-04-30 NOTE — Telephone Encounter (Signed)
New Message   Pt states she was suppose to have some medications called in for her migraines and also something for her cough but the pharmacy didn't receive anything. Please f/u

## 2019-04-30 NOTE — Telephone Encounter (Signed)
She was given RX for allergy medication to help with any cough that may be due to postnasal drainage and she can obtain an otc cough medication such as Robitussin DM or the generic version. I do not recall discussing headache medication with her but I will review her chart but she may want to try otc Excedrin as needed for headaches

## 2019-05-03 ENCOUNTER — Telehealth: Payer: Self-pay | Admitting: Physical Medicine and Rehabilitation

## 2019-05-03 ENCOUNTER — Telehealth: Payer: Self-pay | Admitting: *Deleted

## 2019-05-03 ENCOUNTER — Other Ambulatory Visit: Payer: Self-pay | Admitting: Family Medicine

## 2019-05-03 DIAGNOSIS — R519 Headache, unspecified: Secondary | ICD-10-CM

## 2019-05-03 MED ORDER — GUAIFENESIN-DM 100-10 MG/5ML PO SYRP: 5.0000 mL | ORAL_SOLUTION | ORAL | 0 refills | Status: DC | PRN

## 2019-05-03 NOTE — Telephone Encounter (Signed)
Spoke with Phineas Real F from pt insurance and she states no PA is needed for [phone]. Reference # [id number]

## 2019-05-03 NOTE — Telephone Encounter (Signed)
Right versus Bilat S1 tf esi depending on her pain complaints

## 2019-05-03 NOTE — Progress Notes (Signed)
Patient ID: Caitlin Valdez, female   DOB: 06-30-1971, 48 y.o.   MRN: [id number]   Patient left message requesting refill of fioricet for headaches but did not mention this at her recent visit. She also requests a prescription cough medication and does not wish to buy an otc cough medication to take as needed.

## 2019-05-03 NOTE — Telephone Encounter (Signed)
Patient prefers refill on Fioricet and prescription strength cough medication. Please send to her pharmacy.

## 2019-05-03 NOTE — Telephone Encounter (Signed)
Please let patient know that I placed a referral for her to see a neurologist in follow-up of her headaches and that a RX for cough medication has been sent into her pharmacy

## 2019-05-03 NOTE — Telephone Encounter (Signed)
-----   Message from Lorayne Bender, [place] sent at 04/30/2019  3:22 PM EDT ----- Regarding: Evaluation Please refer to patients chart, possible ESI and per Dr Sharol Given. Patient wants to be scheduled for the earliest appt and in the morning if possible. Thank you

## 2019-05-04 NOTE — Telephone Encounter (Signed)
Patient aware of message sent from Dr. Chapman Fitch regarding cough medication to pharmacy and referral to neurology.  Patient also had questions about her results seen on MyChart. Went over results with patient. Verbalized understanding.

## 2019-05-05 ENCOUNTER — Telehealth: Payer: Self-pay | Admitting: Family Medicine

## 2019-05-05 ENCOUNTER — Other Ambulatory Visit: Payer: Self-pay | Admitting: Family Medicine

## 2019-05-05 DIAGNOSIS — B9689 Other specified bacterial agents as the cause of diseases classified elsewhere: Secondary | ICD-10-CM

## 2019-05-05 DIAGNOSIS — A549 Gonococcal infection, unspecified: Secondary | ICD-10-CM

## 2019-05-05 DIAGNOSIS — N760 Acute vaginitis: Secondary | ICD-10-CM

## 2019-05-05 LAB — CERVICOVAGINAL ANCILLARY ONLY
Bacterial Vaginitis (gardnerella): POSITIVE — AB
Candida Glabrata: NEGATIVE
Candida Vaginitis: NEGATIVE
Chlamydia: NEGATIVE
Comment: NEGATIVE
Comment: NEGATIVE
Comment: NEGATIVE
Comment: NEGATIVE
Comment: NEGATIVE
Comment: NORMAL
Neisseria Gonorrhea: POSITIVE — AB
Trichomonas: NEGATIVE

## 2019-05-05 MED ORDER — AZITHROMYCIN 500 MG PO TABS: ORAL_TABLET | ORAL | 0 refills | Status: DC

## 2019-05-05 MED ORDER — METRONIDAZOLE 500 MG PO TABS: 500.0000 mg | ORAL_TABLET | Freq: Two times a day (BID) | ORAL | 0 refills | Status: DC

## 2019-05-05 NOTE — Telephone Encounter (Signed)
Patient called back wanting to get her lab results. Please follow up.

## 2019-05-05 NOTE — Progress Notes (Signed)
Patient ID: Caitlin Valdez, female   DOB: 08/21/1970, 48 y.o.   MRN: [id number]   Patient with recent cervicovaginal ancillary testing for due to possible exposure to STDs and patient with positive test for gonorrhea and bacterial vaginitis.  Prescription will be sent into her pharmacy for 1 g of azithromycin for treatment of gonorrhea and patient will also be notified to make lab visit to receive 2050 mg IM x1 of ceftriaxone as well as have her partner treated.  Result will also be communicated to the health department if required.  Prescription will also be sent to her pharmacy for metronidazole 500 mg twice daily x7 days for treatment of bacterial vaginitis.

## 2019-05-05 NOTE — Telephone Encounter (Signed)
Spoke with patient and informed her with lab results and she verbalized understanding.

## 2019-05-07 ENCOUNTER — Ambulatory Visit: Payer: BC Managed Care – PPO

## 2019-05-07 ENCOUNTER — Other Ambulatory Visit: Payer: Self-pay

## 2019-05-07 ENCOUNTER — Ambulatory Visit: Payer: BC Managed Care – PPO | Attending: Family Medicine

## 2019-05-07 DIAGNOSIS — A549 Gonococcal infection, unspecified: Secondary | ICD-10-CM | POA: Diagnosis not present

## 2019-05-07 MED ORDER — CEFTRIAXONE SODIUM 250 MG IJ SOLR
250.0000 mg | Freq: Once | INTRAMUSCULAR | Status: AC
  Administered 2019-05-07: 250 mg via INTRAMUSCULAR

## 2019-05-07 NOTE — Progress Notes (Signed)
Patient arrived at clinic to get injection for STD.  Patient was given injection and tolerated well.

## 2019-05-11 ENCOUNTER — Encounter: Payer: Self-pay | Admitting: Family Medicine

## 2019-05-19 ENCOUNTER — Ambulatory Visit: Payer: BC Managed Care – PPO | Admitting: Physical Medicine and Rehabilitation

## 2019-05-20 ENCOUNTER — Other Ambulatory Visit: Payer: Self-pay

## 2019-05-20 ENCOUNTER — Ambulatory Visit: Payer: Self-pay

## 2019-05-20 ENCOUNTER — Encounter: Payer: Self-pay | Admitting: Physical Medicine and Rehabilitation

## 2019-05-20 ENCOUNTER — Ambulatory Visit (INDEPENDENT_AMBULATORY_CARE_PROVIDER_SITE_OTHER): Payer: BC Managed Care – PPO | Admitting: Physical Medicine and Rehabilitation

## 2019-05-20 VITALS — BP 123/76 | HR 75

## 2019-05-20 DIAGNOSIS — M797 Fibromyalgia: Secondary | ICD-10-CM | POA: Diagnosis not present

## 2019-05-20 DIAGNOSIS — G894 Chronic pain syndrome: Secondary | ICD-10-CM | POA: Diagnosis not present

## 2019-05-20 DIAGNOSIS — M5116 Intervertebral disc disorders with radiculopathy, lumbar region: Secondary | ICD-10-CM

## 2019-05-20 MED ORDER — BETAMETHASONE SOD PHOS & ACET 6 (3-3) MG/ML IJ SUSP
12.0000 mg | Freq: Once | INTRAMUSCULAR | Status: AC
  Administered 2019-05-20: 12 mg
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
## 2019-05-20 NOTE — Progress Notes (Signed)
.  Numeric Pain Rating Scale and Functional Assessment Average Pain 9   In the last MONTH (on 0-10 scale) has pain interfered with the following?  1. General activity like being  able to carry out your everyday physical activities such as walking, climbing stairs, carrying groceries, or moving a chair?  Rating(8)   +Driver, -BT, -Dye Allergies.  

## 2019-05-20 NOTE — Procedures (Signed)
S1 Lumbosacral Transforaminal Epidural Steroid Injection - Sub-Pedicular Approach with Fluoroscopic Guidance   Patient: Caitlin Valdez      Date of Birth: [birth year] MRN: [id number] PCP: Antony Blackbird, MD      Visit Date: 05/20/2019   Universal Protocol:    Date/Time: 05/19/2010:57 AM  Consent Given By: the patient  Position:  PRONE  Additional Comments: Vital signs were monitored before and after the procedure. Patient was prepped and draped in the usual sterile fashion. The correct patient, procedure, and site was verified.   Injection Procedure Details:  Procedure Site One Meds Administered:  Meds ordered this encounter  Medications  . betamethasone acetate-betamethasone sodium phosphate (CELESTONE) injection 12 mg    Laterality: Bilateral  Location/Site:  S1 Foramen   Needle size: 22 ga.  Needle type: Spinal  Needle Placement: Transforaminal  Findings:   -Comments: Excellent flow of contrast along the nerve and into the epidural space.  Procedure Details: After squaring off the sacral end-plate to get a true AP view, the C-arm was positioned so that the best possible view of the S1 foramen was visualized. The soft tissues overlying this structure were infiltrated with 2-3 ml. of 1% Lidocaine without Epinephrine.    The spinal needle was inserted toward the target using a "trajectory" view along the fluoroscope beam.  Under AP and lateral visualization, the needle was advanced so it did not puncture dura. Biplanar projections were used to confirm position. Aspiration was confirmed to be negative for CSF and/or blood. A 1-2 ml. volume of Isovue-250 was injected and flow of contrast was noted at each level. Radiographs were obtained for documentation purposes.   After attaining the desired flow of contrast documented above, a 0.5 to 1.0 ml test dose of 0.25% Marcaine was injected into each respective transforaminal space.  The patient was observed for 90 seconds post  injection.  After no sensory deficits were reported, and normal lower extremity motor function was noted,   the above injectate was administered so that equal amounts of the injectate were placed at each foramen (level) into the transforaminal epidural space.   Additional Comments:  The patient tolerated the procedure well Dressing: Band-Aid with 2 x 2 sterile gauze    Post-procedure details: Patient was observed during the procedure. Post-procedure instructions were reviewed.  Patient left the clinic in stable condition.

## 2019-05-31 ENCOUNTER — Encounter: Payer: Self-pay | Admitting: Internal Medicine

## 2019-06-02 ENCOUNTER — Telehealth: Payer: Self-pay | Admitting: Family Medicine

## 2019-06-02 NOTE — Telephone Encounter (Signed)
1) Medication(s) Requested (by name): -metroNIDAZOLE (FLAGYL) 500 MG tablet   2) Pharmacy of Choice: -[facility] [place]

## 2019-06-03 NOTE — Telephone Encounter (Signed)
Will route this information to patient's PCP for review. It looks like she was prescribed metronidazole in October. She has not been seen since. She may need to telemedicine visit to discuss with Dr. Chapman Fitch.

## 2019-06-09 ENCOUNTER — Ambulatory Visit: Payer: Self-pay

## 2019-06-09 ENCOUNTER — Encounter: Payer: Self-pay | Admitting: Physical Medicine and Rehabilitation

## 2019-06-09 ENCOUNTER — Ambulatory Visit (INDEPENDENT_AMBULATORY_CARE_PROVIDER_SITE_OTHER): Payer: BC Managed Care – PPO | Admitting: Physical Medicine and Rehabilitation

## 2019-06-09 ENCOUNTER — Other Ambulatory Visit: Payer: Self-pay

## 2019-06-09 VITALS — BP 113/70 | HR 92

## 2019-06-09 DIAGNOSIS — G8929 Other chronic pain: Secondary | ICD-10-CM

## 2019-06-09 DIAGNOSIS — M797 Fibromyalgia: Secondary | ICD-10-CM

## 2019-06-09 DIAGNOSIS — M5442 Lumbago with sciatica, left side: Secondary | ICD-10-CM

## 2019-06-09 DIAGNOSIS — M5116 Intervertebral disc disorders with radiculopathy, lumbar region: Secondary | ICD-10-CM

## 2019-06-09 NOTE — Progress Notes (Signed)
   Numeric Pain Rating Scale and Functional Assessment Average Pain (8)   In the last MONTH (on 0-10 scale) has pain interfered with the following?  1. General activity like being  able to carry out your everyday physical activities such as walking, climbing stairs, carrying groceries, or moving a chair?  Rating(9)   +Driver, -BT, -Dye Allergies.  

## 2019-06-21 ENCOUNTER — Telehealth: Payer: Self-pay | Admitting: Neurology

## 2019-06-21 ENCOUNTER — Ambulatory Visit: Payer: BC Managed Care – PPO | Admitting: Neurology

## 2019-06-21 NOTE — Telephone Encounter (Signed)
Spoke with patient. She has agreed to a mychart visit for 12/8 at 8 am. Advised patient to log on to mychart 10 mins prior to be prepared for visit.

## 2019-06-21 NOTE — Telephone Encounter (Signed)
Can you please reschedule patient? Due to Covid, let's get her set up as a video appointment sometime later this week. After video appointment we can see if she needs to come into the office.

## 2019-06-22 ENCOUNTER — Telehealth: Payer: Self-pay | Admitting: Neurology

## 2019-06-22 ENCOUNTER — Telehealth: Payer: BC Managed Care – PPO | Admitting: Neurology

## 2019-06-22 DIAGNOSIS — M5116 Intervertebral disc disorders with radiculopathy, lumbar region: Secondary | ICD-10-CM | POA: Insufficient documentation

## 2019-06-22 NOTE — Progress Notes (Signed)
Caitlin Valdez - 48 y.o. female MRN [id number]  Date of birth: August 18, 1970  Office Visit Note: Visit Date: 06/09/2019 PCP: Antony Blackbird, MD Referred by: Antony Blackbird, MD  Subjective: Chief Complaint  Patient presents with  . Lower Back - Pain   HPI:  Caitlin Valdez is a 48 y.o. female who comes in today For planned repeat S1 transforaminal injection for broad disc protrusion at L5-S1 with some lateral recess narrowing left more than right S1 nerve roots.  No frank compression.  Still having low back pain with buttock and hamstring pain.  She reports 4 days post injection was very good with excellent pain relief and then the symptoms returned.  She rates her pain as an 8 out of 10.  Most of her request today are well activity modification at work as well as pain medication.  She has had a order and referral placed by her primary care physician for chronic pain management.  She does suffer from migraine headaches as well as fibromyalgia.  I think the underlying fibromyalgia may be the most predominant factor ongoing.  At least at this point from a physical standpoint the anatomy of her lumbar spine would not be a restrictive issue for working other than pain complaints of lower back pain and generalized body pain.  She might do better with chronic pain management if injection is not beneficial.  We could look at surgical evaluation as well for microdiscectomy.  ROS Otherwise per HPI.  Assessment & Plan: Visit Diagnoses:  1. Radiculopathy due to lumbar intervertebral disc disorder   2. Chronic bilateral low back pain with bilateral sciatica   3. Fibromyalgia     Plan: No additional findings.   Meds & Orders: No orders of the defined types were placed in this encounter.   Orders Placed This Encounter  Procedures  . XR C-ARM NO REPORT  . Epidural Steroid injection    Follow-up: Return if symptoms worsen or fail to improve.   Procedures: No procedures performed  S1 Lumbosacral  Transforaminal Epidural Steroid Injection - Sub-Pedicular Approach with Fluoroscopic Guidance   Patient: Caitlin Valdez      Date of Birth: Oct 29, 1970 MRN: [id number] PCP: Antony Blackbird, MD      Visit Date: 06/09/2019   Universal Protocol:    Date/Time: 12/08/208:55 AM  Consent Given By: the patient  Position:  PRONE  Additional Comments: Vital signs were monitored before and after the procedure. Patient was prepped and draped in the usual sterile fashion. The correct patient, procedure, and site was verified.   Injection Procedure Details:  Procedure Site One Meds Administered: No orders of the defined types were placed in this encounter.   Laterality: Bilateral  Location/Site:  S1 Foramen   Needle size: 22 ga.  Needle type: Spinal  Needle Placement: Transforaminal  Findings:   -Comments: Excellent flow of contrast along the nerve and into the epidural space.  Epidurogram: Contrast epidurogram showed no nerve root cut off or restricted flow pattern.  Procedure Details: After squaring off the sacral end-plate to get a true AP view, the C-arm was positioned so that the best possible view of the S1 foramen was visualized. The soft tissues overlying this structure were infiltrated with 2-3 ml. of 1% Lidocaine without Epinephrine.    The spinal needle was inserted toward the target using a "trajectory" view along the fluoroscope beam.  Under AP and lateral visualization, the needle was advanced so it did not puncture dura. Biplanar projections were  used to confirm position. Aspiration was confirmed to be negative for CSF and/or blood. A 1-2 ml. volume of Isovue-250 was injected and flow of contrast was noted at each level. Radiographs were obtained for documentation purposes.   After attaining the desired flow of contrast documented above, a 0.5 to 1.0 ml test dose of 0.25% Marcaine was injected into each respective transforaminal space.  The patient was observed for 90  seconds post injection.  After no sensory deficits were reported, and normal lower extremity motor function was noted,   the above injectate was administered so that equal amounts of the injectate were placed at each foramen (level) into the transforaminal epidural space.   Additional Comments:  The patient tolerated the procedure well Dressing: Band-Aid with 2 x 2 sterile gauze    Post-procedure details: Patient was observed during the procedure. Post-procedure instructions were reviewed.  Patient left the clinic in stable condition.    Clinical History: MRI LUMBAR SPINE WITHOUT CONTRAST  TECHNIQUE: Multiplanar, multisequence MR imaging of the lumbar spine was performed. No intravenous contrast was administered.  COMPARISON:  Lumbar spine radiographs 04/06/2019  FINDINGS: Segmentation: There are five lumbar type vertebral bodies. The last full intervertebral disc space is labeled L5-S1. This correlates with the lumbar radiographs.  Alignment:  Normal  Vertebrae:  Normal marrow signal.  No bone lesions or fractures.  Conus medullaris and cauda equina: Conus extends to the L2 level. Conus and cauda equina appear normal.  Paraspinal and other soft tissues: No significant paraspinal or retroperitoneal findings.  Disc levels:  L1-2: No significant findings.  L2-3: No significant findings.  L3-4: No significant findings.  L4-5: Mild facet disease but no disc protrusions, spinal or foraminal stenosis.  L5-S1: Central disc protrusion with mild mass effect on the ventral thecal sac slightly asymmetric right. There is mild impression on the ventral thecal sac and potential irritation of the S1 nerve roots. The exiting L5 nerve roots appear normal. Mild to moderate facet disease, left greater than right.  IMPRESSION: Broad-based disc protrusion at L5-S1 slightly asymmetric right with potential irritation of the S1 nerve roots.  The other intervertebral  disc spaces are unremarkable.   Electronically Signed   By: Marijo Sanes M.D.   On: 04/30/2019 10:53     Objective:  VS:  HT:    WT:   BMI:     BP:113/70  HR:92bpm  TEMP: ( )  RESP:  Physical Exam Musculoskeletal:     Comments: Patient ambulates without aid she has good distal strength no pain with hip rotation.     Ortho Exam Imaging: No results found.

## 2019-06-22 NOTE — Telephone Encounter (Signed)
error 

## 2019-06-22 NOTE — Telephone Encounter (Signed)
Patient will not be seen, her main concern is not headaches at this time. She will call for appointment in the future.   Sent email to Dr. Chapman Fitch:  Patient was referred from you for intractable headaches(thank you for referral!) but she says her main issue is low back pain not the headaches currently. She was not able to get online (I scheduled her for a video visit bc I thought it was headaches) but we were not able to connect so I spoke with her briefly on the phone but could not have an appointment. I told her there is nothing else that I can do for her back pain, she sees Dr. Ernestina Patches, she is getting injections into her low back, I advised her to see Dr. Ernestina Patches and discuss surgical options if clinically warranted. 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 Ernestina Patches may be able to address that as I am not surgical. She was not referred for these issues and anyway there is not much I can do at this point for them, she already goes to a pain clinic as well. I told her when she wants to discuss headaches she can call the office and reschedule but for anythinghave to do with her low back and CTS she can follow up with you and/or Dr. Ernestina Patches. She mentioned time off from work but again she will have to follow up with you or dr Ernestina Patches, so sorry! Thanks

## 2019-06-22 NOTE — Procedures (Signed)
S1 Lumbosacral Transforaminal Epidural Steroid Injection - Sub-Pedicular Approach with Fluoroscopic Guidance   Patient: Caitlin Valdez      Date of Birth: Mar 06, 1971 MRN: [id number] PCP: Antony Blackbird, MD      Visit Date: 06/09/2019   Universal Protocol:    Date/Time: 12/08/208:55 AM  Consent Given By: the patient  Position:  PRONE  Additional Comments: Vital signs were monitored before and after the procedure. Patient was prepped and draped in the usual sterile fashion. The correct patient, procedure, and site was verified.   Injection Procedure Details:  Procedure Site One Meds Administered: No orders of the defined types were placed in this encounter.   Laterality: Bilateral  Location/Site:  S1 Foramen   Needle size: 22 ga.  Needle type: Spinal  Needle Placement: Transforaminal  Findings:   -Comments: Excellent flow of contrast along the nerve and into the epidural space.  Epidurogram: Contrast epidurogram showed no nerve root cut off or restricted flow pattern.  Procedure Details: After squaring off the sacral end-plate to get a true AP view, the C-arm was positioned so that the best possible view of the S1 foramen was visualized. The soft tissues overlying this structure were infiltrated with 2-3 ml. of 1% Lidocaine without Epinephrine.    The spinal needle was inserted toward the target using a "trajectory" view along the fluoroscope beam.  Under AP and lateral visualization, the needle was advanced so it did not puncture dura. Biplanar projections were used to confirm position. Aspiration was confirmed to be negative for CSF and/or blood. A 1-2 ml. volume of Isovue-250 was injected and flow of contrast was noted at each level. Radiographs were obtained for documentation purposes.   After attaining the desired flow of contrast documented above, a 0.5 to 1.0 ml test dose of 0.25% Marcaine was injected into each respective transforaminal space.  The patient was  observed for 90 seconds post injection.  After no sensory deficits were reported, and normal lower extremity motor function was noted,   the above injectate was administered so that equal amounts of the injectate were placed at each foramen (level) into the transforaminal epidural space.   Additional Comments:  The patient tolerated the procedure well Dressing: Band-Aid with 2 x 2 sterile gauze    Post-procedure details: Patient was observed during the procedure. Post-procedure instructions were reviewed.  Patient left the clinic in stable condition.

## 2019-07-07 ENCOUNTER — Ambulatory Visit: Payer: BC Managed Care – PPO | Admitting: Orthopaedic Surgery

## 2019-07-08 ENCOUNTER — Other Ambulatory Visit: Payer: Self-pay

## 2019-07-13 ENCOUNTER — Ambulatory Visit: Payer: BC Managed Care – PPO | Attending: Internal Medicine

## 2019-07-13 DIAGNOSIS — Z20822 Contact with and (suspected) exposure to covid-19: Secondary | ICD-10-CM

## 2019-07-14 LAB — NOVEL CORONAVIRUS, NAA: SARS-CoV-2, NAA: NOT DETECTED

## 2019-08-06 ENCOUNTER — Other Ambulatory Visit (HOSPITAL_COMMUNITY)
Admission: RE | Admit: 2019-08-06 | Discharge: 2019-08-06 | Disposition: A | Discharge status: Home / Self Care | Payer: BC Managed Care – PPO | Source: Ambulatory Visit | Attending: Family | Admitting: Family

## 2019-08-06 ENCOUNTER — Ambulatory Visit (HOSPITAL_BASED_OUTPATIENT_CLINIC_OR_DEPARTMENT_OTHER): Payer: BC Managed Care – PPO | Admitting: Family

## 2019-08-06 ENCOUNTER — Encounter: Payer: Self-pay | Admitting: Family

## 2019-08-06 ENCOUNTER — Other Ambulatory Visit: Payer: Self-pay

## 2019-08-06 VITALS — BP 132/75 | HR 76 | Ht 64.0 in | Wt 185.0 lb

## 2019-08-06 DIAGNOSIS — B9689 Other specified bacterial agents as the cause of diseases classified elsewhere: Secondary | ICD-10-CM | POA: Diagnosis not present

## 2019-08-06 DIAGNOSIS — Z113 Encounter for screening for infections with a predominantly sexual mode of transmission: Secondary | ICD-10-CM

## 2019-08-06 DIAGNOSIS — N760 Acute vaginitis: Secondary | ICD-10-CM | POA: Insufficient documentation

## 2019-08-06 DIAGNOSIS — R350 Frequency of micturition: Secondary | ICD-10-CM | POA: Diagnosis not present

## 2019-08-06 DIAGNOSIS — N898 Other specified noninflammatory disorders of vagina: Secondary | ICD-10-CM

## 2019-08-06 MED ORDER — METRONIDAZOLE 500 MG PO TABS: 500.0000 mg | ORAL_TABLET | Freq: Two times a day (BID) | ORAL | 0 refills | Status: AC

## 2019-08-06 NOTE — Patient Instructions (Signed)

## 2019-08-06 NOTE — Progress Notes (Signed)
Established Patient Office Visit  Subjective:  Patient ID: Caitlin Valdez, female    DOB: 1970/07/23  Age: 49 y.o. MRN: [id number]  CC: STI testing  HPI Caitlin Valdez presents for concerns of STI testing.   1. STI Concern:  Symptoms began two weeks ago. Reports thick white vaginal discharge with odor but not described as fishy odor. Denies intercourse for four weeks. Intercourse while sexually active has been with one partner. Denies painful urination. Denies burning. Denies vaginal bleeding. Denies vaginal inflammation. Admits vaginal itching. Admits rectal itching. Admits urinary frequency. Admits urinary urgency. Reports had an endometrial ablation around 6 years ago. Since that time she has had bacterial vaginosis at least every 3 months. Since symptoms began has eaten yogurt and used no-scent Summer's Eve with no relief. Had bacterial vaginosis last in October 2020. Reports that she is unaware if partner was treated. States that partner does not like to wear condoms during intercourse and will remove it sometimes.   Past Medical History:  Diagnosis Date  . Abdominal pain, right lower quadrant   . Anxiety   . Depression   . Fibromyalgia   . GERD (gastroesophageal reflux disease)   . Migraine   . Obesity   . Peripheral vascular disease (HCC)    RIGHT LEG  . PONV (postoperative nausea and vomiting)   . Stroke ([place])   . Trichomonas 2012    Past Surgical History:  Procedure Laterality Date  . CESAREAN SECTION    . FRACTURE SURGERY    . HYSTEROSCOPY WITH NOVASURE N/A 08/08/2014   Procedure:  NOVASURE;  Surgeon: Emily Filbert, MD;  Location: [facility];  Service: Gynecology;  Laterality: N/A;  . VEIN SURGERY      Family History  Problem Relation Age of Onset  . Diabetes Mother   . Hypertension Mother   . Breast cancer Mother 88  . Diabetes Father   . Hypertension Father   . Diabetes Other   . Hypertension Other   . Other Neg Hx     Social History   Socioeconomic History    . Marital status: Single    Spouse name: Not on file  . Number of children: Not on file  . Years of education: Not on file  . Highest education level: Not on file  Occupational History  . Not on file  Tobacco Use  . Smoking status: Current Every Day Smoker    Packs/day: 1.00    Years: 5.00    Pack years: 5.00    Types: Cigarettes  . Smokeless tobacco: Never Used  Substance and Sexual Activity  . Alcohol use: No  . Drug use: No  . Sexual activity: Yes    Birth control/protection: Condom  Other Topics Concern  . Not on file  Social History Narrative  . Not on file   Social Determinants of Health   Financial Resource Strain:   . Difficulty of Paying Living Expenses: Not on file  Food Insecurity:   . Worried About Charity fundraiser in the Last Year: Not on file  . Ran Out of Food in the Last Year: Not on file  Transportation Needs:   . Lack of Transportation (Medical): Not on file  . Lack of Transportation (Non-Medical): Not on file  Physical Activity:   . Days of Exercise per Week: Not on file  . Minutes of Exercise per Session: Not on file  Stress:   . Feeling of Stress : Not on file  Social Connections:   . Frequency of Communication with Friends and Family: Not on file  . Frequency of Social Gatherings with Friends and Family: Not on file  . Attends Religious Services: Not on file  . Active Member of Clubs or Organizations: Not on file  . Attends Archivist Meetings: Not on file  . Marital Status: Not on file  Intimate Partner Violence:   . Fear of Current or Ex-Partner: Not on file  . Emotionally Abused: Not on file  . Physically Abused: Not on file  . Sexually Abused: Not on file    Outpatient Medications Prior to Visit  Medication Sig Dispense Refill  . aspirin-acetaminophen-caffeine (EXCEDRIN MIGRAINE) 250-250-65 MG tablet Take 2 tablets by mouth every 6 (six) hours as needed for headache.    [name] azithromycin (ZITHROMAX) 500 MG tablet Take 2  pills by mouth x1 2 tablet 0  . butalbital-acetaminophen-caffeine (FIORICET) 50-325-40 MG tablet Take 1 tablet by mouth every 6 (six) hours as needed for headache. 30 tablet 1  . cetirizine (ZYRTEC) 10 MG tablet Take 1 tablet (10 mg total) by mouth at bedtime. To help with congestion 30 tablet 1  . DULoxetine HCl 40 MG CPEP Take 1 capsule by mouth daily.    [name] gabapentin (NEURONTIN) 300 MG capsule TAKE 1 CAPSULE BY MOUTH EVERY MORNING AND 1 AT NOON AND 2 CAPSULES AT BEDTIME    . guaiFENesin-dextromethorphan (ROBITUSSIN DM) 100-10 MG/5ML syrup Take 5 mLs by mouth every 4 (four) hours as needed for cough. 118 mL 0  . metroNIDAZOLE (FLAGYL) 500 MG tablet Take 1 tablet (500 mg total) by mouth 2 (two) times daily. 14 tablet 0  . Misc. Devices MISC Please provide patient with insurance approved custom compression socks for PVD. 1 each 0  . omeprazole (PRILOSEC) 40 MG capsule Take 1 capsule (40 mg total) by mouth daily. 30 capsule 3  . oxyCODONE (OXY IR/ROXICODONE) 5 MG immediate release tablet TAKE 1 TABLET BY MOUTH ONLY AS NEEDED TWICE DAILY FOR NEURALGIA PAIN IN EXCESS OF GABAPENTIN    . polyethylene glycol powder (MIRALAX) 17 GM/SCOOP powder Mix 17 gm with 8 or more ounces of fluid and drink daily to help with constipation; 1 month supply 255 g prn   No facility-administered medications prior to visit.    No Known Allergies  ROS Review of Systems  Review of Systems - Negative except stated above. History obtained from the patient General ROS: positive for  - hot flashes and night sweats negative for - chills or fever Genito-Urinary ROS: no dysuria, trouble voiding, or hematuria  Respiratory ROS: no cough, shortness of breath, or wheezing Cardiovascular ROS: no chest pain or dyspnea on exertion   Objective:    Physical Exam  Constitutional: She is well-developed, well-nourished, and in no distress.  Cardiovascular: Normal rate, regular rhythm, normal heart sounds and intact distal pulses.  Exam reveals no gallop and no friction rub.  No murmur heard. Pulmonary/Chest: Effort normal and breath sounds normal. No respiratory distress. She has no wheezes. She has no rales. She exhibits no tenderness.   Physical Exam  Constitutional: She is well-developed, well-nourished, and in no distress.  Cardiovascular: Normal rate, regular rhythm, normal heart sounds and intact distal pulses. Exam reveals no gallop and no friction rub.  No murmur heard. Pulmonary/Chest: Effort normal and breath sounds normal. No respiratory distress. She has no wheezes. She has no rales. She exhibits no tenderness.   There were no vitals taken for this visit. Wt  Readings from Last 3 Encounters:  04/29/19 185 lb 9.6 oz (84.2 kg)  04/06/19 184 lb (83.5 kg)  03/16/19 184 lb (83.5 kg)     There are no preventive care reminders to display for this patient.  There are no preventive care reminders to display for this patient.  Lab Results  Component Value Date   TSH 0.780 04/29/2019   Lab Results  Component Value Date   WBC 8.7 04/29/2019   HGB 13.8 04/29/2019   HCT 39.2 04/29/2019   MCV 93 04/29/2019   PLT 260 04/29/2019   Lab Results  Component Value Date   NA 138 04/29/2019   K 4.3 04/29/2019   CO2 24 04/29/2019   GLUCOSE 87 04/29/2019   BUN 8 04/29/2019   CREATININE 0.85 04/29/2019   BILITOT 0.2 04/29/2019   ALKPHOS 84 04/29/2019   AST 16 04/29/2019   ALT 19 04/29/2019   PROT 7.1 04/29/2019   ALBUMIN 4.4 04/29/2019   CALCIUM 9.5 04/29/2019   ANIONGAP 10 04/02/2018   Lab Results  Component Value Date   CHOL 181 12/23/2013   Lab Results  Component Value Date   HDL 75 12/23/2013   Lab Results  Component Value Date   LDLCALC 85 12/23/2013   Lab Results  Component Value Date   TRIG 103 12/23/2013   Lab Results  Component Value Date   CHOLHDL 2.4 12/23/2013   Lab Results  Component Value Date   HGBA1C 5.4 03/18/2018      Assessment & Plan:   1. ROUTINE SCREENING FOR  STI (SEXUALLY TRANSMITTED INFECTION):  -Cervicovaginal ancillary testing -Patient performed self swab for cervicovaginal ancillary testing for gonorrhea, chlamydia, trichomoniasis, bacterial vaginitis, and candidal infection. -Patient will have HIV blood testing with reflex -HIV antibody (with reflex) -Patient will be treated empirically with Metronidazole (FLAGYL) for bacterial vaginosis -Metronidazole (FLAGYL); Take 1 tablet (500 mg total) 2 (two) times daily for 7 days; Dispense: 14 tablets; Refill: 0  -Patient will be notified if additional treatment is needed based on the results of the cervicovaginal ancillary only and HIV antibody (with reflex) Problem List Items Addressed This Visit    None      Follow-up: Follow-up with attending physician if symptoms do not resolve. Patient was given clear instructions to go to Emergency Department or return to medical center if symptoms don't improve, worsen, or new problems develop.The patient verbalized understanding.   Camillia Herter, NP

## 2019-08-07 LAB — HIV ANTIBODY (ROUTINE TESTING W REFLEX): HIV Screen 4th Generation wRfx: NONREACTIVE

## 2019-08-09 LAB — CERVICOVAGINAL ANCILLARY ONLY
Bacterial Vaginitis (gardnerella): POSITIVE — AB
Candida Glabrata: NEGATIVE
Candida Vaginitis: NEGATIVE
Chlamydia: NEGATIVE
Comment: NEGATIVE
Comment: NEGATIVE
Comment: NEGATIVE
Comment: NEGATIVE
Comment: NEGATIVE
Comment: NORMAL
Neisseria Gonorrhea: NEGATIVE
Trichomonas: NEGATIVE

## 2019-08-10 ENCOUNTER — Encounter: Payer: Self-pay | Admitting: Orthopaedic Surgery

## 2019-08-10 ENCOUNTER — Other Ambulatory Visit: Payer: Self-pay

## 2019-08-10 ENCOUNTER — Ambulatory Visit (INDEPENDENT_AMBULATORY_CARE_PROVIDER_SITE_OTHER): Payer: BC Managed Care – PPO | Admitting: Orthopaedic Surgery

## 2019-08-10 DIAGNOSIS — M545 Low back pain, unspecified: Secondary | ICD-10-CM

## 2019-08-10 DIAGNOSIS — G8929 Other chronic pain: Secondary | ICD-10-CM | POA: Insufficient documentation
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
## 2019-08-11 NOTE — Progress Notes (Signed)
Patient is positive for bacterial vaginosis.   Patient treated empirically with Metronidazole 500 mg orally twice per day for 7 days.   Patient should return if symptoms do not resolve.   Results visible to patient via [facility].

## 2019-08-24 ENCOUNTER — Telehealth: Payer: Self-pay | Admitting: Orthopaedic Surgery

## 2019-08-24 NOTE — Telephone Encounter (Signed)
Called patient to discuss surgery date for microdiscectomy.  Unable to leave a voicemail because the mail box is full.  I will email patient tomorrow letting her know we have several dates to choose from if we can get approval from insurance carrier.

## 2019-08-25 NOTE — Telephone Encounter (Signed)
noted 

## 2019-08-30 ENCOUNTER — Telehealth: Payer: Self-pay | Admitting: Orthopaedic Surgery

## 2019-08-30 NOTE — Telephone Encounter (Signed)
Please see below and advise. OK for work note?

## 2019-08-30 NOTE — Telephone Encounter (Signed)
Pt called in upset requesting a call back from dr.aytes and was wanting to let dr.yates know that she has left many messages with debbie to schedule surgery but hasn't received a call back. Pt said she had to leave work due to her chronic pain and cannot continue like this.   Please give her a call to advise,   [phone]

## 2019-08-30 NOTE — Telephone Encounter (Signed)
Please send back to me after you speak with patient.
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
## 2019-08-31 ENCOUNTER — Encounter: Payer: Self-pay | Admitting: Orthopaedic Surgery

## 2019-08-31 ENCOUNTER — Ambulatory Visit (INDEPENDENT_AMBULATORY_CARE_PROVIDER_SITE_OTHER): Payer: BC Managed Care – PPO | Admitting: Orthopaedic Surgery

## 2019-08-31 ENCOUNTER — Other Ambulatory Visit: Payer: Self-pay

## 2019-08-31 VITALS — BP 126/89 | HR 77 | Ht 63.0 in | Wt 180.0 lb

## 2019-08-31 DIAGNOSIS — M5116 Intervertebral disc disorders with radiculopathy, lumbar region: Secondary | ICD-10-CM | POA: Diagnosis not present

## 2019-08-31 NOTE — Progress Notes (Signed)
Office Visit Note   Patient: Caitlin Valdez           Date of Birth: 07-Mar-1971           MRN: [id number] Visit Date: 08/31/2019              Requested by: Antony Blackbird, MD [place],   [id number] PCP: Antony Blackbird, MD   Assessment & Plan: Visit Diagnoses:  1. Radiculopathy due to lumbar intervertebral disc disorder     Plan: Patient's pain is progressed over the last 9 months to the point where she is not able to continue working.  She states she is ready to proceed with microdiscectomy surgery since she is failed anti-inflammatories, epidural injections Medrol Dosepak, muscle relaxants and now currently having to take Percocet for the pain.  Her principal problem is pain when she standing in since her job involves her standing all shift she has had to stop work at times she has been in tears.  She is concerned about keeping her job.  We discussed risks including possibility of recurrent disc herniation reoperation dural tear.  We discussed overnight stay in the hospital.  She has family available to help her after she is discharged.  Questions were elicited and answered she requests we proceed.  Work slip given for no work x8 weeks.  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  Follow-Up Instructions: No follow-ups on file.   Orders:  No orders of the defined types were placed in this encounter.  No orders of the defined types were placed in this encounter.     Procedures: No procedures performed   Clinical Data: No additional findings.   Subjective: Chief Complaint  Patient presents with  . Lower Back - Pain    HPI 49 year old female returns with L5-S1 disc protrusion with radiculopathy right leg.  She states she had to get out of work yesterday due to increased pain symptoms and is waiting on surgery approval from her insurance company.  Patient been taking Percocet from pain clinic for back and leg pain.  Patient states her pain is been  severe and she has been crying due to the pain.  Patient has central disc protrusion with mass-effect central and right.  L5 nerve roots exit freely but there is slight displacement S1 nerve root on the right greater than left.  All other levels appear normal in the lumbar spine. Patient had anti-inflammatories epidural steroid injections x2, muscle relaxants and now narcotic medication Percocet with persistent pain.  Patient had increased pain with standing which is a problem with her when she is trying to work at [employer].  She is worked for them for several years. Review of Systems is system positive history of depression anxiety, GERD, migraines fibromyalgia.  BMI 31.  Negative for chest pain CVA negative for seizures.   Objective: Vital Signs: BP 126/89   Pulse 77   Ht 5\' 3"  (1.6 m)   Wt 180 lb (81.6 kg)   BMI 31.89 kg/m   Physical Exam Constitutional:      Appearance: She is well-developed.  HENT:     Head: Normocephalic.     Right Ear: External ear normal.     Left Ear: External ear normal.  Eyes:     Pupils: Pupils are equal, round, and reactive to light.  Neck:     Thyroid: No thyromegaly.     Trachea: No tracheal deviation.  Cardiovascular:     Rate and Rhythm:  Normal rate.  Pulmonary:     Effort: Pulmonary effort is normal.  Abdominal:     Palpations: Abdomen is soft.  Skin:    General: Skin is warm and dry.  Neurological:     Mental Status: She is alert and oriented to person, place, and time.  Psychiatric:        Behavior: Behavior normal.     Ortho Exam patient has negative logroll to her hips.  She has sciatic notch tenderness on the right positive popliteal compression test.  Ankle jerk is intact right and left.  Positive straight leg raising on the right at 80 degrees negative on the left.  She is able to heel and toe walk.  Posterior tibial resistive testing and peroneals are strong.  Decreased sensory lateral side of the right foot and plantar surface of the  right foot.  L5 distribution right and left is normal.  Specialty Comments:  No specialty comments available.  Imaging: CLINICAL DATA:  Low back and bilateral leg pain, weakness and numbness for a couple of years.  EXAM: MRI LUMBAR SPINE WITHOUT CONTRAST  TECHNIQUE: Multiplanar, multisequence MR imaging of the lumbar spine was performed. No intravenous contrast was administered.  COMPARISON:  Lumbar spine radiographs 04/06/2019  FINDINGS: Segmentation: There are five lumbar type vertebral bodies. The last full intervertebral disc space is labeled L5-S1. This correlates with the lumbar radiographs.  Alignment:  Normal  Vertebrae:  Normal marrow signal.  No bone lesions or fractures.  Conus medullaris and cauda equina: Conus extends to the L2 level. Conus and cauda equina appear normal.  Paraspinal and other soft tissues: No significant paraspinal or retroperitoneal findings.  Disc levels:  L1-2: No significant findings.  L2-3: No significant findings.  L3-4: No significant findings.  L4-5: Mild facet disease but no disc protrusions, spinal or foraminal stenosis.  L5-S1: Central disc protrusion with mild mass effect on the ventral thecal sac slightly asymmetric right. There is mild impression on the ventral thecal sac and potential irritation of the S1 nerve roots. The exiting L5 nerve roots appear normal. Mild to moderate facet disease, left greater than right.  IMPRESSION: Broad-based disc protrusion at L5-S1 slightly asymmetric right with potential irritation of the S1 nerve roots.  The other intervertebral disc spaces are unremarkable.   Electronically Signed   By: Marijo Sanes M.D.   On: 04/30/2019 10:53    PMFS History: Patient Active Problem List   Diagnosis Date Noted  . Low back pain 08/10/2019  . Radiculopathy due to lumbar intervertebral disc disorder 06/22/2019  . Anxiety and depression 03/17/2018  . Hematuria  06/27/2016  . S/P endometrial ablation 06/27/2016  . Lower abdominal pain 06/27/2016  . Migraine without status migrainosus, not intractable 06/27/2016  . Vitamin D insufficiency 02/11/2016  . De Quervain's tenosynovitis, left 02/09/2016  . Vitamin B12 deficiency 10/17/2015  . Allergic rhinitis 12/29/2014  . Current smoker 11/22/2014  . Obesity 06/29/2014  . Spinal stenosis in cervical region 02/22/2014  . Fibromyalgia 07/15/2009   Past Medical History:  Diagnosis Date  . Abdominal pain, right lower quadrant   . Anxiety   . Depression   . Fibromyalgia   . GERD (gastroesophageal reflux disease)   . Migraine   . Obesity   . Peripheral vascular disease (HCC)    RIGHT LEG  . PONV (postoperative nausea and vomiting)   . Stroke ([place])   . Trichomonas 2012    Family History  Problem Relation Age of Onset  . Diabetes  Mother   . Hypertension Mother   . Breast cancer Mother 54  . Diabetes Father   . Hypertension Father   . Diabetes Other   . Hypertension Other   . Other Neg Hx     Past Surgical History:  Procedure Laterality Date  . CESAREAN SECTION    . FRACTURE SURGERY    . HYSTEROSCOPY WITH NOVASURE N/A 08/08/2014   Procedure:  NOVASURE;  Surgeon: Emily Filbert, MD;  Location: [facility];  Service: Gynecology;  Laterality: N/A;  . VEIN SURGERY     Social History   Occupational History  . Not on file  Tobacco Use  . Smoking status: Current Every Day Smoker    Packs/day: 1.00    Years: 5.00    Pack years: 5.00    Types: Cigarettes  . Smokeless tobacco: Never Used  Substance and Sexual Activity  . Alcohol use: No  . Drug use: No  . Sexual activity: Yes    Birth control/protection: Condom

## 2019-08-31 NOTE — Telephone Encounter (Signed)
I called discussed. She is having more pain and wants surgery. She is coming back in today

## 2019-09-15 ENCOUNTER — Other Ambulatory Visit: Payer: Self-pay

## 2019-09-16 ENCOUNTER — Other Ambulatory Visit: Payer: Self-pay | Admitting: Family Medicine

## 2019-09-16 ENCOUNTER — Telehealth: Payer: Self-pay | Admitting: Family Medicine

## 2019-09-16 ENCOUNTER — Ambulatory Visit (INDEPENDENT_AMBULATORY_CARE_PROVIDER_SITE_OTHER): Payer: BC Managed Care – PPO | Admitting: Surgery

## 2019-09-16 ENCOUNTER — Other Ambulatory Visit: Payer: Self-pay

## 2019-09-16 ENCOUNTER — Telehealth: Payer: Self-pay | Admitting: Orthopaedic Surgery

## 2019-09-16 ENCOUNTER — Encounter: Payer: Self-pay | Admitting: Surgery

## 2019-09-16 VITALS — BP 122/83 | HR 75 | Ht 64.57 in | Wt 186.2 lb

## 2019-09-16 DIAGNOSIS — Z01818 Encounter for other preprocedural examination: Secondary | ICD-10-CM

## 2019-09-16 DIAGNOSIS — M5116 Intervertebral disc disorders with radiculopathy, lumbar region: Secondary | ICD-10-CM

## 2019-09-16 NOTE — Progress Notes (Signed)
49 year old black female history of right L5-S1 HNP comes in for preop evaluation.  Patient states that she continues to have ongoing low back pain and right lower extremity radiculopathy but also over the last couple weeks she has been having pain radiating down the left leg as well to her foot.  Right leg worse than left.  We have not yet received preop medical clearance from Dr. Antony Blackbird.  There is a note in the system from Dr. Chapman Fitch stating that patient will also be needing preop cardiac clearance.  Today history and physical performed.  Patient is complaining of some left-sided abdominal soreness and feeling of a large mass.  This was evaluated by CT abdomen and pelvis July 17, 2016 and that report showed:  EXAM: CT ABDOMEN AND PELVIS WITHOUT AND WITH CONTRAST  TECHNIQUE: Multidetector CT imaging of the abdomen and pelvis was performed following the standard protocol before and following the bolus administration of intravenous contrast.  CONTRAST:  1 ISOVUE-300 IOPAMIDOL (ISOVUE-300) INJECTION 61%  COMPARISON:  4/ 6/ 12  FINDINGS: Lower chest: No acute abnormality.  Hepatobiliary: No focal liver abnormality is seen. No gallstones, gallbladder wall thickening, or biliary dilatation.  Pancreas: Unremarkable. No pancreatic ductal dilatation or surrounding inflammatory changes.  Spleen: Normal in size without focal abnormality.  Adrenals/Urinary Tract: Adrenal glands are unremarkable. Kidneys are normal, without renal calculi, focal lesion, or hydronephrosis. Bladder is unremarkable.  Stomach/Bowel: Stomach is within normal limits. Appendix appears normal. No evidence of bowel wall thickening, distention, or inflammatory changes.  Vascular/Lymphatic: No significant vascular findings are present. No enlarged abdominal or pelvic lymph nodes.  Reproductive: Uterus and bilateral adnexa are unremarkable.  Other: No abdominal wall hernia or abnormality. No  abdominopelvic ascites.  Musculoskeletal: No acute or significant osseous findings.  IMPRESSION: 1. No acute findings within the abdomen or pelvis and no abdominal mass identified.   Electronically Signed   By: Kerby Moors M.D.   On: 07/17/2016 15:21  Today on exam patient does have some left-sided abdominal tenderness and this area is firm.  Patient denies any issues with her bowels.   Plan Surgical procedure discussed with patient in great detail.  All questions answered.  She will need to see her primary care physician for preop medical clearance and they can also help with arranging cardiac clearance since this is what Dr. Chapman Fitch is recommending.  Also advised patient to discuss with Dr. Chapman Fitch the left-sided abdominal concerns.

## 2019-09-16 NOTE — Telephone Encounter (Signed)
Referral was placed by Dr. Chapman Fitch and sent to referrals department. Maren Reamer placed the referral as Urgent/STAT to Cardiology.  She has not been able to reach patient to inform the patient about the appointment d/t voicemail being full.

## 2019-09-16 NOTE — Progress Notes (Signed)
Patient ID: Caitlin Valdez, female   DOB: 19-Aug-1970, 49 y.o.   MRN: [id number]   Message received from patient's orthopedic doctor, Rodell Perna, the patient is to undergo L5-S1 microdiscectomy under general anesthesia and needs preoperative clearance.  Patient will be contacted by nursing staff in order to schedule office appointment for her preoperative clearance exam.  Also due to patient's age and history of smoking, and obesity, she will also be referred for cardiac clearance preoperatively.

## 2019-09-16 NOTE — Telephone Encounter (Signed)
I spoke to Caitlin Valdez these morning regarding about her referral to cardiologist and I told her that it was sent Urgent to the [facility] .  Patient already have an appointment 09-21-19 @ 8am I'm calling patient to let her know  but  her voice mail is full .

## 2019-09-16 NOTE — Telephone Encounter (Signed)
Patient has completed visit with Benjiman Core, PA today. Patient came to my office after the H&P to discuss upcoming surgery and clearance from Dr. Chapman Fitch. Request for clearance faxed to [phone] with confirmation receipt 09/13/19 at 13:53. I also spoke with Tonya on 09/14/19 at 8:42am to confirm the fax went through.  Today it is noted by Dr. Chapman Fitch the patient will require pre-operative clearance.  Patient requested the appointment be made as soon as possible.  I called Dr. Siri Cole office and spoke with Rolm Gala. He provided patient an appointment for 09/17/19 @11 :10am tomorrow.  Patient left the office and came back with phone in hand and on speaker phone, stating she had received a call from Dr. Siri Cole office letting her know the appointment to see Dr. Chapman Fitch is cancelled and she is being referred to cardiology.  Clinic staff on line confirmed a referral has been placed. Benjiman Core, PA aware of referral. Patient waiting for cardiology appointment.

## 2019-09-17 ENCOUNTER — Ambulatory Visit: Payer: BC Managed Care – PPO | Admitting: Family Medicine

## 2019-09-17 NOTE — Telephone Encounter (Signed)
Patient name and DOB verified. Patient states she was made aware of appointment on yesterday. Verbalized appt. Day and time.

## 2019-09-20 NOTE — Progress Notes (Signed)
Cardiology Office Note:    Date:  09/21/2019   ID:  Caitlin Valdez, DOB 26-Mar-1971, MRN [id number]  PCP:  Antony Blackbird, MD  Cardiologist:  No primary care provider on file.  Electrophysiologist:  None   Referring MD: Antony Blackbird, MD   Chief Complaint  Patient presents with  . Pre-op Exam   History of Present Illness:    Caitlin Valdez is a 49 y.o. female with a hx of fibromyalgia, anxiety/depression, GERD, PVD, CVA who is referred by Dr. Chapman Fitch for preop evaluation prior to spinal surgery.  Plan for L5-S1 microdiscectomy under general anesthesia.  She reports that she thinks she had a stroke 15 to 20 years ago, but does not remember the details.  States that she had symptoms of numbness.  She does not recall having any imaging done at that time.  She did have a brain MRI in 2014, which showed no abnormalities.  She reports that she is not doing any exercise now due to the back pain.  However last summer states that she was going for walks for 1 hour/day.  Then up until November she was exercising 3 times per week, would do stretches, jump rope, and walk for 20 to 25 minutes.  She denies any exertional chest pain or dyspnea.  States that since November activity has been limited by back pain and she is not exercising regularly anymore.  Smokes 1ppd.  Brother had MI in 100s.      Past Medical History:  Diagnosis Date  . Abdominal pain, right lower quadrant   . Anxiety   . Depression   . Fibromyalgia   . GERD (gastroesophageal reflux disease)   . Migraine   . Obesity   . Peripheral vascular disease (HCC)    RIGHT LEG  . PONV (postoperative nausea and vomiting)   . Stroke ([place])   . Trichomonas 2012    Past Surgical History:  Procedure Laterality Date  . CESAREAN SECTION    . FRACTURE SURGERY    . HYSTEROSCOPY WITH NOVASURE N/A 08/08/2014   Procedure:  NOVASURE;  Surgeon: Emily Filbert, MD;  Location: [facility];  Service: Gynecology;  Laterality: N/A;  . VEIN SURGERY      Current  Medications: Current Meds  Medication Sig  . aspirin-acetaminophen-caffeine (EXCEDRIN MIGRAINE) 250-250-65 MG tablet Take 2 tablets by mouth every 6 (six) hours as needed for headache.  [name] azithromycin (ZITHROMAX) 500 MG tablet Take 2 pills by mouth x1  . butalbital-acetaminophen-caffeine (FIORICET) 50-325-40 MG tablet Take 1 tablet by mouth every 6 (six) hours as needed for headache.  . cetirizine (ZYRTEC) 10 MG tablet Take 1 tablet (10 mg total) by mouth at bedtime. To help with congestion (Patient taking differently: Take 10 mg by mouth daily as needed for allergies. )  . DULoxetine HCl 40 MG CPEP Take 40 mg by mouth daily.   [name] gabapentin (NEURONTIN) 300 MG capsule Take 300 mg by mouth 2 (two) times daily.   [name] guaiFENesin-dextromethorphan (ROBITUSSIN DM) 100-10 MG/5ML syrup Take 5 mLs by mouth every 4 (four) hours as needed for cough.  . Misc. Devices MISC Please provide patient with insurance approved custom compression socks for PVD.  [name] omeprazole (PRILOSEC) 40 MG capsule Take 1 capsule (40 mg total) by mouth daily.  [name] oxyCODONE (OXY IR/ROXICODONE) 5 MG immediate release tablet Take 5 mg by mouth 2 (two) times daily as needed for moderate pain.   . polyethylene glycol powder (MIRALAX) 17 GM/SCOOP powder Mix 17  gm with 8 or more ounces of fluid and drink daily to help with constipation; 1 month supply     Allergies:   Patient has no known allergies.   Social History   Socioeconomic History  . Marital status: Single    Spouse name: Not on file  . Number of children: Not on file  . Years of education: Not on file  . Highest education level: Not on file  Occupational History  . Not on file  Tobacco Use  . Smoking status: Current Every Day Smoker    Packs/day: 1.00    Years: 5.00    Pack years: 5.00    Types: Cigarettes  . Smokeless tobacco: Never Used  Substance and Sexual Activity  . Alcohol use: No  . Drug use: No  . Sexual activity: Yes    Birth control/protection: Condom    Other Topics Concern  . Not on file  Social History Narrative  . Not on file   Social Determinants of Health   Financial Resource Strain:   . Difficulty of Paying Living Expenses: Not on file  Food Insecurity:   . Worried About Charity fundraiser in the Last Year: Not on file  . Ran Out of Food in the Last Year: Not on file  Transportation Needs:   . Lack of Transportation (Medical): Not on file  . Lack of Transportation (Non-Medical): Not on file  Physical Activity:   . Days of Exercise per Week: Not on file  . Minutes of Exercise per Session: Not on file  Stress:   . Feeling of Stress : Not on file  Social Connections:   . Frequency of Communication with Friends and Family: Not on file  . Frequency of Social Gatherings with Friends and Family: Not on file  . Attends Religious Services: Not on file  . Active Member of Clubs or Organizations: Not on file  . Attends Archivist Meetings: Not on file  . Marital Status: Not on file     Family History: The patient's family history includes Breast cancer (age of onset: 20) in her mother; Diabetes in her father, mother, and another family member; Hypertension in her father, mother, and another family member. There is no history of Other.  ROS:   Please see the history of present illness.     All other systems reviewed and are negative.  EKGs/Labs/Other Studies Reviewed:    The following studies were reviewed today:   EKG:  EKG is  ordered today.  The ekg ordered today demonstrates normal sinus rhythm, rate 72, no ST/T abnormalities  Recent Labs: 04/29/2019: ALT 19; BUN 8; Creatinine, Ser 0.85; Hemoglobin 13.8; Platelets 260; Potassium 4.3; Sodium 138; TSH 0.780  Recent Lipid Panel    Component Value Date/Time   CHOL 181 12/23/2013 1720   TRIG 103 12/23/2013 1720   HDL 75 12/23/2013 1720   CHOLHDL 2.4 12/23/2013 1720   VLDL 21 12/23/2013 1720   LDLCALC 85 12/23/2013 1720    Physical Exam:    VS:  BP  132/80   Pulse 72   Ht 5\' 3"  (1.6 m)   Wt 189 lb (85.7 kg)   SpO2 98%   BMI 33.48 kg/m     Wt Readings from Last 3 Encounters:  09/21/19 189 lb (85.7 kg)  09/16/19 186 lb 3.2 oz (84.5 kg)  08/31/19 180 lb (81.6 kg)     GEN:  Well nourished, well developed in no acute distress HEENT: Normal NECK:  No JVD LYMPHATICS: No lymphadenopathy CARDIAC: RRR, no murmurs, rubs, gallops RESPIRATORY:  Clear to auscultation without rales, wheezing or rhonchi  ABDOMEN: Soft, non-tender, non-distended MUSCULOSKELETAL:  No edema; No deformity  SKIN: Warm and dry NEUROLOGIC:  Alert and oriented x 3 PSYCHIATRIC:  Normal affect   ASSESSMENT:    1. Pre-op evaluation   2. Tobacco use   3. PVD (peripheral vascular disease) (HCC)    PLAN:     Preop evaluation: Prior to spinal surgery.  No symptoms to suggest active cardiac condition.  Activity is currently limited by back pain, but up until November was exercising 3 times per week, achieving greater than 4 METS with no symptoms of chest pain or dyspnea.  RCRI score 1 given reported CVA history (though this is unclear, she does not remember details and MRI brain in 2014 did not show evidence of prior stroke).  Overall would classify as intermediate risk for an intermediate risk surgery. -No further cardiac work-up recommended prior to surgery  Tobacco use: Patient counseled on the risks of tobacco use and cessation strongly encouraged  PVD: reported history, no ABIs on record.  Will plan to check ABIs following her surgery  RTC in 6 months  Medication Adjustments/Labs and Tests Ordered: Current medicines are reviewed at length with the patient today.  Concerns regarding medicines are outlined above.  No orders of the defined types were placed in this encounter.  No orders of the defined types were placed in this encounter.   Patient Instructions  Medication Instructions:  Your physician recommends that you continue on your current  medications as directed. Please refer to the Current Medication list given to you today.  Lab Work: NONE  Testing/Procedures: NONE  Follow-Up: At [facility], you and your health needs are our priority.  As part of our continuing mission to provide you with exceptional heart care, we have created designated Provider Care Teams.  These Care Teams include your primary Cardiologist (physician) and Advanced Practice Providers (APPs -  Physician Assistants and Nurse Practitioners) who all work together to provide you with the care you need, when you need it.  We recommend signing up for the patient portal called "MyChart".  Sign up information is provided on this After Visit Summary.  MyChart is used to connect with patients for Virtual Visits (Telemedicine).  Patients are able to view lab/test results, encounter notes, upcoming appointments, etc.  Non-urgent messages can be sent to your provider as well.   To learn more about what you can do with MyChart, go to [email or web address].    Your next appointment:   6 month(s)  The format for your next appointment:   In Person  Provider:   Oswaldo Milian, MD        Signed, Donato Heinz, MD  09/21/2019 9:01 AM    [place]

## 2019-09-21 ENCOUNTER — Other Ambulatory Visit: Payer: Self-pay

## 2019-09-21 ENCOUNTER — Ambulatory Visit (INDEPENDENT_AMBULATORY_CARE_PROVIDER_SITE_OTHER): Payer: BC Managed Care – PPO | Admitting: Cardiology

## 2019-09-21 ENCOUNTER — Encounter: Payer: Self-pay | Admitting: Cardiology

## 2019-09-21 VITALS — BP 132/80 | HR 72 | Ht 63.0 in | Wt 189.0 lb

## 2019-09-21 DIAGNOSIS — Z01818 Encounter for other preprocedural examination: Secondary | ICD-10-CM

## 2019-09-21 DIAGNOSIS — Z720 Tobacco use: Secondary | ICD-10-CM | POA: Diagnosis not present

## 2019-09-21 DIAGNOSIS — I739 Peripheral vascular disease, unspecified: Secondary | ICD-10-CM | POA: Diagnosis not present

## 2019-09-21 NOTE — Progress Notes (Signed)
[place] ([address] [place] [id number] [place] ([place]) [place] [address] Phone: [phone] Fax: [phone]    Your procedure is scheduled on Monday, March 15th.  Report to [facility] Main Entrance "A" at 10:30 A.M., and check in at the Admitting office.  Call this number if you have problems the morning of surgery:  [phone]  Call [phone] if you have any questions prior to your surgery date Monday-Friday 8am-4pm   Remember:  Do not eat after midnight the night before your surgery  You may drink clear liquids until 9:30 A.M. the morning of your surgery.   Clear liquids allowed are: Water, Non-Citrus Juices (without pulp), Carbonated Beverages, Clear Tea, Black Coffee Only, and Gatorade    Enhanced Recovery after Surgery for Orthopedics Enhanced Recovery after Surgery is a protocol used to improve the stress on your body and your recovery after surgery.  Patient Instructions  . The night before surgery:  o No food after midnight. ONLY clear liquids after midnight  .  [name] The day of surgery (if you do NOT have diabetes):  o Drink ONE (1) Pre-Surgery Clear Ensure as directed.   o Finish the drink at the designated time by 9:30 A.M. the morning of your surgery.  o Please if possible, DO NOT SIP. Drink in one setting. o Nothing else to drink after completing the  Pre-Surgery Clear Ensure.         If you have questions, please contact your surgeon's office.   Take these medicines the morning of surgery with A SIP OF WATER DULoxetine  gabapentin (NEURONTIN) omeprazole (PRILOSEC)   If needed - cetirizine (ZYRTEC), oxyCODONE (OXY IR/ROXICODONE)  As of today, STOP taking any Aspirin (unless otherwise instructed by your surgeon), Aleve, Naproxen, Ibuprofen, Motrin, Advil, Goody's, BC's, all herbal medications, fish oil, and all vitamins.   The Morning of Surgery  Do not wear jewelry, make-up or nail polish.  Do not wear lotions,  powders, perfumes, or deodorant  Do not shave 48 hours prior to surgery.    Do not bring valuables to the hospital.  [facility] is not responsible for any belongings or valuables.  If you are a smoker, DO NOT Smoke 24 hours prior to surgery  If you wear a CPAP at night please bring your mask the morning of surgery   Remember that you must have someone to transport you home after your surgery, and remain with you for 24 hours if you are discharged the same day.  Please bring cases for contacts, glasses, hearing aids, dentures or bridgework because it cannot be worn into surgery.   Leave your suitcase in the car.  After surgery it may be brought to your room.  For patients admitted to the hospital, discharge time will be determined by your treatment team.  Patients discharged the day of surgery will not be allowed to drive home.   Special instructions:   [facility]- Preparing For Surgery  Before surgery, you can play an important role. Because skin is not sterile, your skin needs to be as free of germs as possible. You can reduce the number of germs on your skin by washing with CHG (chlorahexidine gluconate) Soap before surgery.  CHG is an antiseptic cleaner which kills germs and bonds with the skin to continue killing germs even after washing.    Oral Hygiene is also important to reduce your risk of infection.  Remember - BRUSH YOUR TEETH THE MORNING  OF SURGERY WITH YOUR REGULAR TOOTHPASTE  Please do not use if you have an allergy to CHG or antibacterial soaps. If your skin becomes reddened/irritated stop using the CHG.  Do not shave (including legs and underarms) for at least 48 hours prior to first CHG shower. It is OK to shave your face.  Please follow these instructions carefully.   1. Shower the NIGHT BEFORE SURGERY and the MORNING OF SURGERY with CHG Soap.   2. If you chose to wash your hair, wash your hair first as usual with your normal shampoo.  3. After you shampoo,  rinse your hair and body thoroughly to remove the shampoo.  4. Use CHG as you would any other liquid soap. You can apply CHG directly to the skin and wash gently with a scrungie or a clean washcloth.   5. Apply the CHG Soap to your body ONLY FROM THE NECK DOWN.  Do not use on open wounds or open sores. Avoid contact with your eyes, ears, mouth and genitals (private parts). Wash Face and genitals (private parts)  with your normal soap.   6. Wash thoroughly, paying special attention to the area where your surgery will be performed.  7. Thoroughly rinse your body with warm water from the neck down.  8. DO NOT shower/wash with your normal soap after using and rinsing off the CHG Soap.  9. Pat yourself dry with a CLEAN TOWEL.  10. Wear CLEAN PAJAMAS to bed the night before surgery, wear comfortable clothes the morning of surgery  11. Place CLEAN SHEETS on your bed the night of your first shower and DO NOT SLEEP WITH PETS.  Day of Surgery: Please shower the morning of surgery with the CHG soap Do not apply any deodorants/lotions. Please wear clean clothes to the hospital/surgery center.   Remember to brush your teeth WITH YOUR REGULAR TOOTHPASTE.  Please read over the following fact sheets that you were given.

## 2019-09-21 NOTE — Patient Instructions (Signed)
Medication Instructions:  Your physician recommends that you continue on your current medications as directed. Please refer to the Current Medication list given to you today.  Lab Work: NONE  Testing/Procedures: NONE  Follow-Up: At [facility], you and your health needs are our priority.  As part of our continuing mission to provide you with exceptional heart care, we have created designated Provider Care Teams.  These Care Teams include your primary Cardiologist (physician) and Advanced Practice Providers (APPs -  Physician Assistants and Nurse Practitioners) who all work together to provide you with the care you need, when you need it.  We recommend signing up for the patient portal called "MyChart".  Sign up information is provided on this After Visit Summary.  MyChart is used to connect with patients for Virtual Visits (Telemedicine).  Patients are able to view lab/test results, encounter notes, upcoming appointments, etc.  Non-urgent messages can be sent to your provider as well.   To learn more about what you can do with MyChart, go to [email or web address].    Your next appointment:   6 month(s)  The format for your next appointment:   In Person  Provider:   Oswaldo Milian, MD

## 2019-09-22 ENCOUNTER — Encounter (HOSPITAL_COMMUNITY)
Admission: RE | Admit: 2019-09-22 | Discharge: 2019-09-22 | Disposition: A | Discharge status: Home / Self Care | Payer: BC Managed Care – PPO | Source: Ambulatory Visit | Attending: Orthopaedic Surgery | Admitting: Orthopaedic Surgery

## 2019-09-22 ENCOUNTER — Encounter (HOSPITAL_COMMUNITY): Payer: Self-pay

## 2019-09-22 ENCOUNTER — Other Ambulatory Visit: Payer: Self-pay

## 2019-09-22 DIAGNOSIS — Z01818 Encounter for other preprocedural examination: Secondary | ICD-10-CM | POA: Insufficient documentation

## 2019-09-22 LAB — CBC
HCT: 42.4 % (ref 36.0–46.0)
Hemoglobin: 14 g/dL (ref 12.0–15.0)
MCH: 32.5 pg (ref 26.0–34.0)
MCHC: 33 g/dL (ref 30.0–36.0)
MCV: 98.4 fL (ref 80.0–100.0)
Platelets: 226 10*3/uL (ref 150–400)
RBC: 4.31 MIL/uL (ref 3.87–5.11)
RDW: 11.8 % (ref 11.5–15.5)
WBC: 7.8 10*3/uL (ref 4.0–10.5)
nRBC: 0 % (ref 0.0–0.2)

## 2019-09-22 LAB — URINALYSIS, ROUTINE W REFLEX MICROSCOPIC
Bacteria, UA: NONE SEEN
Bilirubin Urine: NEGATIVE
Glucose, UA: NEGATIVE mg/dL
Ketones, ur: NEGATIVE mg/dL
Leukocytes,Ua: NEGATIVE
Nitrite: NEGATIVE
Protein, ur: NEGATIVE mg/dL
Specific Gravity, Urine: 1.017 (ref 1.005–1.030)
pH: 6 (ref 5.0–8.0)

## 2019-09-22 LAB — COMPREHENSIVE METABOLIC PANEL
ALT: 17 U/L (ref 0–44)
AST: 16 U/L (ref 15–41)
Albumin: 3.9 g/dL (ref 3.5–5.0)
Alkaline Phosphatase: 61 U/L (ref 38–126)
Anion gap: 7 (ref 5–15)
BUN: 5 mg/dL — ABNORMAL LOW (ref 6–20)
CO2: 23 mmol/L (ref 22–32)
Calcium: 9.4 mg/dL (ref 8.9–10.3)
Chloride: 107 mmol/L (ref 98–111)
Creatinine, Ser: 0.78 mg/dL (ref 0.44–1.00)
GFR calc Af Amer: >60 mL/min (ref >60)
GFR calc non Af Amer: >60 mL/min (ref >60)
Glucose, Bld: 90 mg/dL (ref 70–99)
Potassium: 3.8 mmol/L (ref 3.5–5.1)
Sodium: 137 mmol/L (ref 135–145)
Total Bilirubin: 0.4 mg/dL (ref 0.3–1.2)
Total Protein: 7.2 g/dL (ref 6.5–8.1)

## 2019-09-22 LAB — SURGICAL PCR SCREEN
MRSA, PCR: NEGATIVE
Staphylococcus aureus: POSITIVE — AB

## 2019-09-22 NOTE — Progress Notes (Signed)
PCP - Dr. Antony Blackbird Cardiologist - denies, referred to Dr. Oswaldo Milian for pre-op evaluation prior to spinal surgery  PPM/ICD - denies  Chest x-ray - N/A EKG - 09/21/2019  Stress Test - denies ECHO - denies Cardiac Cath - denies  Sleep Study - denies CPAP - N/A  Blood Thinner Instructions: den Aspirin Instructions:  ERAS Protcol - PRE-SURGERY Ensure or G2-   COVID TEST- Scheduled for 09/23/2019. Patient verbalized understanding of self-quarantine instructions, appointment time and place.  Anesthesia review: YES, pre-op clearance (cardiac)  Patient denies shortness of breath, fever, cough and chest pain at PAT appointment  All instructions explained to the patient, with a verbal understanding of the material. Patient agrees to go over the instructions while at home for a better understanding. Patient also instructed to self quarantine after being tested for COVID-19. The opportunity to ask questions was provided.

## 2019-09-23 ENCOUNTER — Other Ambulatory Visit (HOSPITAL_COMMUNITY)
Admission: RE | Admit: 2019-09-23 | Discharge: 2019-09-23 | Disposition: A | Discharge status: Home / Self Care | Payer: BC Managed Care – PPO | Source: Ambulatory Visit | Attending: Orthopaedic Surgery | Admitting: Orthopaedic Surgery

## 2019-09-23 DIAGNOSIS — Z01812 Encounter for preprocedural laboratory examination: Secondary | ICD-10-CM | POA: Diagnosis present

## 2019-09-23 DIAGNOSIS — Z20822 Contact with and (suspected) exposure to covid-19: Secondary | ICD-10-CM | POA: Insufficient documentation

## 2019-09-23 LAB — SARS CORONAVIRUS 2 (TAT 6-24 HRS): SARS Coronavirus 2: NEGATIVE

## 2019-09-23 NOTE — Anesthesia Preprocedure Evaluation (Addendum)
Anesthesia Evaluation  Patient identified by MRN, date of birth, ID band Patient awake    Reviewed: Allergy & Precautions, H&P , NPO status , Patient's Chart, lab work & pertinent test results, reviewed documented beta blocker date and time   History of Anesthesia Complications (+) PONV and history of anesthetic complications  Airway Mallampati: II  TM Distance: >3 FB Neck ROM: full    Dental no notable dental hx.    Pulmonary neg pulmonary ROS, Current Smoker,    Pulmonary exam normal breath sounds clear to auscultation       Cardiovascular Exercise Tolerance: Good + Peripheral Vascular Disease   Rhythm:regular Rate:Normal     Neuro/Psych  Headaches, PSYCHIATRIC DISORDERS Anxiety Depression  Neuromuscular disease CVA    GI/Hepatic Neg liver ROS, GERD  Medicated,  Endo/Other  negative endocrine ROS  Renal/GU negative Renal ROS  negative genitourinary   Musculoskeletal  (+) Fibromyalgia -  Abdominal   Peds  Hematology negative hematology ROS (+)   Anesthesia Other Findings   Reproductive/Obstetrics negative OB ROS                            Anesthesia Physical Anesthesia Plan  ASA: III  Anesthesia Plan: General   Post-op Pain Management:    Induction: Intravenous  PONV Risk Score and Plan: 3 and Ondansetron, Dexamethasone, Midazolam, Treatment may vary due to age or medical condition and Scopolamine patch - Pre-op  Airway Management Planned: Oral ETT  Additional Equipment:   Intra-op Plan:   Post-operative Plan: Extubation in OR  Informed Consent: I have reviewed the patients History and Physical, chart, labs and discussed the procedure including the risks, benefits and alternatives for the proposed anesthesia with the patient or authorized representative who has indicated his/her understanding and acceptance.     Dental Advisory Given  Plan Discussed with: CRNA,  Anesthesiologist and Surgeon  Anesthesia Plan Comments: (Pt was referred to cardiology by her PCP for preop eval. Seen by Dr. Gardiner Rhyme 09/21/19. Per note, "Preop evaluation: Prior to spinal surgery.  No symptoms to suggest active cardiac condition.  Activity is currently limited by back pain, but up until November was exercising 3 times per week, achieving greater than 4 METS with no symptoms of chest pain or dyspnea.  RCRI score 1 given reported CVA history (though this is unclear, she does not remember details and MRI brain in 2014 did not show evidence of prior stroke).  Overall would classify as intermediate risk for an intermediate risk surgery. -No further cardiac work-up recommended prior to surgery  Tobacco use: Patient counseled on the risks of tobacco use and cessation strongly encouraged  PVD: reported history, no ABIs on record.  Will plan to check ABIs following her surgery  RTC in 6 months"  Preop labs reviewed, unremarkable.   EKG 09/21/19: NSR. Rate 72.)       Anesthesia Quick Evaluation

## 2019-09-23 NOTE — Progress Notes (Signed)
Anesthesia Chart Review:  Pt was referred to cardiology by her PCP for preop eval. Seen by Dr. Gardiner Rhyme 09/21/19. Per note, "Preop evaluation: Prior to spinal surgery.  No symptoms to suggest active cardiac condition.  Activity is currently limited by back pain, but up until November was exercising 3 times per week, achieving greater than 4 METS with no symptoms of chest pain or dyspnea.  RCRI score 1 given reported CVA history (though this is unclear, she does not remember details and MRI brain in 2014 did not show evidence of prior stroke).  Overall would classify as intermediate risk for an intermediate risk surgery. -No further cardiac work-up recommended prior to surgery  Tobacco use: Patient counseled on the risks of tobacco use and cessation strongly encouraged  PVD: reported history, no ABIs on record.  Will plan to check ABIs following her surgery  RTC in 6 months"  Preop labs reviewed, unremarkable.   EKG 09/21/19: NSR. Rate 72.  Wynonia Musty [facility] Short Stay Center/Anesthesiology Phone [phone] 09/23/2019 9:41 AM

## 2019-09-27 ENCOUNTER — Ambulatory Visit (HOSPITAL_COMMUNITY): Payer: BC Managed Care – PPO | Admitting: Anesthesiology

## 2019-09-27 ENCOUNTER — Encounter (HOSPITAL_COMMUNITY)
Admission: RE | Disposition: A | Discharge status: Home / Self Care | Payer: Self-pay | Source: Ambulatory Visit | Attending: Orthopaedic Surgery

## 2019-09-27 ENCOUNTER — Ambulatory Visit (HOSPITAL_COMMUNITY): Payer: BC Managed Care – PPO

## 2019-09-27 ENCOUNTER — Ambulatory Visit (HOSPITAL_COMMUNITY): Payer: BC Managed Care – PPO | Admitting: Physician Assistant

## 2019-09-27 ENCOUNTER — Encounter (HOSPITAL_COMMUNITY): Payer: Self-pay | Admitting: Orthopaedic Surgery

## 2019-09-27 ENCOUNTER — Other Ambulatory Visit: Payer: Self-pay

## 2019-09-27 ENCOUNTER — Observation Stay (HOSPITAL_COMMUNITY)
Admission: RE | Admit: 2019-09-27 | Discharge: 2019-09-28 | Disposition: A | Discharge status: Home / Self Care | Payer: BC Managed Care – PPO | Source: Ambulatory Visit | Attending: Orthopaedic Surgery | Admitting: Orthopaedic Surgery

## 2019-09-27 DIAGNOSIS — M5126 Other intervertebral disc displacement, lumbar region: Secondary | ICD-10-CM

## 2019-09-27 DIAGNOSIS — K219 Gastro-esophageal reflux disease without esophagitis: Secondary | ICD-10-CM | POA: Insufficient documentation

## 2019-09-27 DIAGNOSIS — I739 Peripheral vascular disease, unspecified: Secondary | ICD-10-CM | POA: Diagnosis not present

## 2019-09-27 DIAGNOSIS — M545 Low back pain: Secondary | ICD-10-CM | POA: Diagnosis present

## 2019-09-27 DIAGNOSIS — Z6830 Body mass index (BMI) 30.0-30.9, adult: Secondary | ICD-10-CM | POA: Insufficient documentation

## 2019-09-27 DIAGNOSIS — Z419 Encounter for procedure for purposes other than remedying health state, unspecified: Secondary | ICD-10-CM

## 2019-09-27 DIAGNOSIS — E669 Obesity, unspecified: Secondary | ICD-10-CM | POA: Insufficient documentation

## 2019-09-27 DIAGNOSIS — F17210 Nicotine dependence, cigarettes, uncomplicated: Secondary | ICD-10-CM | POA: Insufficient documentation

## 2019-09-27 DIAGNOSIS — Z8673 Personal history of transient ischemic attack (TIA), and cerebral infarction without residual deficits: Secondary | ICD-10-CM | POA: Diagnosis not present

## 2019-09-27 DIAGNOSIS — M5117 Intervertebral disc disorders with radiculopathy, lumbosacral region: Secondary | ICD-10-CM | POA: Diagnosis not present

## 2019-09-27 DIAGNOSIS — M5116 Intervertebral disc disorders with radiculopathy, lumbar region: Secondary | ICD-10-CM

## 2019-09-27 HISTORY — PX: LUMBAR LAMINECTOMY/DECOMPRESSION MICRODISCECTOMY: SHX5026

## 2019-09-27 LAB — POCT PREGNANCY, URINE: Preg Test, Ur: NEGATIVE

## 2019-09-27 SURGERY — LUMBAR LAMINECTOMY/DECOMPRESSION MICRODISCECTOMY
Anesthesia: General | Site: Spine Lumbar

## 2019-09-27 MED ORDER — DOCUSATE SODIUM 100 MG PO CAPS
100.0000 mg | ORAL_CAPSULE | Freq: Two times a day (BID) | ORAL | Status: DC
  Administered 2019-09-27 – 2019-09-28 (×2): 100 mg via ORAL
  Filled 2019-09-27 (×2): qty 1

## 2019-09-27 MED ORDER — SODIUM CHLORIDE 0.9% FLUSH: 3.0000 mL | INTRAVENOUS | Status: DC | PRN

## 2019-09-27 MED ORDER — METHOCARBAMOL 500 MG PO TABS
500.0000 mg | ORAL_TABLET | Freq: Four times a day (QID) | ORAL | Status: DC | PRN
  Administered 2019-09-27 – 2019-09-28 (×2): 500 mg via ORAL
  Filled 2019-09-27 (×2): qty 1

## 2019-09-27 MED ORDER — ONDANSETRON HCL 4 MG/2ML IJ SOLN
4.0000 mg | Freq: Once | INTRAMUSCULAR | Status: AC | PRN
  Administered 2019-09-27: 4 mg via INTRAVENOUS

## 2019-09-27 MED ORDER — FENTANYL CITRATE (PF) 250 MCG/5ML IJ SOLN
INTRAMUSCULAR | Status: AC
  Filled 2019-09-27: qty 5

## 2019-09-27 MED ORDER — OXYCODONE HCL 5 MG PO TABS
5.0000 mg | ORAL_TABLET | Freq: Once | ORAL | Status: AC | PRN
  Administered 2019-09-27: 5 mg via ORAL

## 2019-09-27 MED ORDER — LORATADINE 10 MG PO TABS
10.0000 mg | ORAL_TABLET | Freq: Every day | ORAL | Status: DC
  Administered 2019-09-27 – 2019-09-28 (×2): 10 mg via ORAL
  Filled 2019-09-27 (×2): qty 1

## 2019-09-27 MED ORDER — BUPIVACAINE HCL (PF) 0.25 % IJ SOLN
INTRAMUSCULAR | Status: AC
  Filled 2019-09-27: qty 30

## 2019-09-27 MED ORDER — CEFAZOLIN SODIUM-DEXTROSE 2-4 GM/100ML-% IV SOLN
2.0000 g | INTRAVENOUS | Status: AC
  Administered 2019-09-27: 2 g via INTRAVENOUS

## 2019-09-27 MED ORDER — ONDANSETRON HCL 4 MG/2ML IJ SOLN
INTRAMUSCULAR | Status: DC | PRN
  Administered 2019-09-27: 4 mg via INTRAVENOUS

## 2019-09-27 MED ORDER — MIDAZOLAM HCL 2 MG/2ML IJ SOLN
INTRAMUSCULAR | Status: AC
  Filled 2019-09-27: qty 2

## 2019-09-27 MED ORDER — FENTANYL CITRATE (PF) 100 MCG/2ML IJ SOLN
INTRAMUSCULAR | Status: AC
  Administered 2019-09-27: 50 ug via INTRAVENOUS
  Filled 2019-09-27: qty 2

## 2019-09-27 MED ORDER — LIDOCAINE 2% (20 MG/ML) 5 ML SYRINGE
INTRAMUSCULAR | Status: DC | PRN
  Administered 2019-09-27: 60 mg via INTRAVENOUS

## 2019-09-27 MED ORDER — OXYCODONE-ACETAMINOPHEN 5-325 MG PO TABS: 1.0000 | ORAL_TABLET | ORAL | 0 refills | Status: DC | PRN

## 2019-09-27 MED ORDER — CHLORHEXIDINE GLUCONATE 4 % EX LIQD: 60.0000 mL | Freq: Once | CUTANEOUS | Status: DC

## 2019-09-27 MED ORDER — PHENYLEPHRINE HCL-NACL 10-0.9 MG/250ML-% IV SOLN
INTRAVENOUS | Status: DC | PRN
  Administered 2019-09-27 (×2): 50 ug/min via INTRAVENOUS

## 2019-09-27 MED ORDER — PROPOFOL 10 MG/ML IV BOLUS
INTRAVENOUS | Status: DC | PRN
  Administered 2019-09-27: 200 mg via INTRAVENOUS

## 2019-09-27 MED ORDER — METHOCARBAMOL 1000 MG/10ML IJ SOLN
500.0000 mg | Freq: Four times a day (QID) | INTRAVENOUS | Status: DC | PRN
  Filled 2019-09-27: qty 5

## 2019-09-27 MED ORDER — ALBUTEROL SULFATE HFA 108 (90 BASE) MCG/ACT IN AERS
INHALATION_SPRAY | RESPIRATORY_TRACT | Status: AC
  Filled 2019-09-27: qty 6.7

## 2019-09-27 MED ORDER — POLYETHYLENE GLYCOL 3350 17 G PO PACK: 17.0000 g | PACK | Freq: Every day | ORAL | Status: DC | PRN

## 2019-09-27 MED ORDER — CEFAZOLIN SODIUM-DEXTROSE 2-4 GM/100ML-% IV SOLN
INTRAVENOUS | Status: AC
  Filled 2019-09-27: qty 100

## 2019-09-27 MED ORDER — ROCURONIUM BROMIDE 10 MG/ML (PF) SYRINGE
PREFILLED_SYRINGE | INTRAVENOUS | Status: AC
  Filled 2019-09-27: qty 10

## 2019-09-27 MED ORDER — GABAPENTIN 300 MG PO CAPS
300.0000 mg | ORAL_CAPSULE | Freq: Two times a day (BID) | ORAL | Status: DC
  Administered 2019-09-27 – 2019-09-28 (×3): 300 mg via ORAL
  Filled 2019-09-27 (×3): qty 1

## 2019-09-27 MED ORDER — KETOROLAC TROMETHAMINE 30 MG/ML IJ SOLN
INTRAMUSCULAR | Status: AC
  Filled 2019-09-27: qty 1

## 2019-09-27 MED ORDER — ACETAMINOPHEN 325 MG PO TABS: 325.0000 mg | ORAL_TABLET | ORAL | Status: DC | PRN

## 2019-09-27 MED ORDER — BUTALBITAL-APAP-CAFFEINE 50-325-40 MG PO TABS: 1.0000 | ORAL_TABLET | Freq: Four times a day (QID) | ORAL | Status: DC | PRN

## 2019-09-27 MED ORDER — KETOROLAC TROMETHAMINE 30 MG/ML IJ SOLN
30.0000 mg | Freq: Once | INTRAMUSCULAR | Status: AC
  Administered 2019-09-27: 30 mg via INTRAVENOUS

## 2019-09-27 MED ORDER — PHENOL 1.4 % MT LIQD: 1.0000 | OROMUCOSAL | Status: DC | PRN

## 2019-09-27 MED ORDER — 0.9 % SODIUM CHLORIDE (POUR BTL) OPTIME
TOPICAL | Status: DC | PRN
  Administered 2019-09-27: 1000 mL

## 2019-09-27 MED ORDER — ONDANSETRON HCL 4 MG/2ML IJ SOLN
INTRAMUSCULAR | Status: AC
  Filled 2019-09-27: qty 2

## 2019-09-27 MED ORDER — LIDOCAINE 2% (20 MG/ML) 5 ML SYRINGE
INTRAMUSCULAR | Status: AC
  Filled 2019-09-27: qty 5

## 2019-09-27 MED ORDER — FENTANYL CITRATE (PF) 100 MCG/2ML IJ SOLN
INTRAMUSCULAR | Status: DC | PRN
  Administered 2019-09-27 (×5): 50 ug via INTRAVENOUS

## 2019-09-27 MED ORDER — FENTANYL CITRATE (PF) 100 MCG/2ML IJ SOLN: 50.0000 ug | Freq: Once | INTRAMUSCULAR | Status: AC

## 2019-09-27 MED ORDER — FENTANYL CITRATE (PF) 100 MCG/2ML IJ SOLN
INTRAMUSCULAR | Status: AC
  Filled 2019-09-27: qty 2

## 2019-09-27 MED ORDER — PANTOPRAZOLE SODIUM 40 MG PO TBEC
40.0000 mg | DELAYED_RELEASE_TABLET | Freq: Every day | ORAL | Status: DC
  Administered 2019-09-27 – 2019-09-28 (×2): 40 mg via ORAL
  Filled 2019-09-27 (×2): qty 1

## 2019-09-27 MED ORDER — MIDAZOLAM HCL 5 MG/5ML IJ SOLN
INTRAMUSCULAR | Status: DC | PRN
  Administered 2019-09-27: 2 mg via INTRAVENOUS

## 2019-09-27 MED ORDER — METHOCARBAMOL 500 MG PO TABS: 500.0000 mg | ORAL_TABLET | Freq: Four times a day (QID) | ORAL | 0 refills | Status: DC | PRN

## 2019-09-27 MED ORDER — HYDROMORPHONE HCL 1 MG/ML IJ SOLN
0.5000 mg | INTRAMUSCULAR | Status: DC | PRN
  Administered 2019-09-27: 1 mg via INTRAVENOUS
  Filled 2019-09-27: qty 1

## 2019-09-27 MED ORDER — SCOPOLAMINE 1 MG/3DAYS TD PT72
MEDICATED_PATCH | TRANSDERMAL | Status: AC
  Administered 2019-09-27: 1.5 mg via TRANSDERMAL
  Filled 2019-09-27: qty 1

## 2019-09-27 MED ORDER — BUPIVACAINE HCL (PF) 0.25 % IJ SOLN
INTRAMUSCULAR | Status: DC | PRN
  Administered 2019-09-27: 8 mL

## 2019-09-27 MED ORDER — OXYCODONE HCL 5 MG PO TABS
ORAL_TABLET | ORAL | Status: AC
  Filled 2019-09-27: qty 1

## 2019-09-27 MED ORDER — SCOPOLAMINE 1 MG/3DAYS TD PT72: 1.0000 | MEDICATED_PATCH | TRANSDERMAL | Status: DC

## 2019-09-27 MED ORDER — SODIUM CHLORIDE 0.9% FLUSH
3.0000 mL | Freq: Two times a day (BID) | INTRAVENOUS | Status: DC
  Administered 2019-09-27 (×2): 3 mL via INTRAVENOUS

## 2019-09-27 MED ORDER — EPHEDRINE SULFATE-NACL 50-0.9 MG/10ML-% IV SOSY
PREFILLED_SYRINGE | INTRAVENOUS | Status: DC | PRN
  Administered 2019-09-27: 10 mg via INTRAVENOUS

## 2019-09-27 MED ORDER — PHENYLEPHRINE 40 MCG/ML (10ML) SYRINGE FOR IV PUSH (FOR BLOOD PRESSURE SUPPORT)
PREFILLED_SYRINGE | INTRAVENOUS | Status: DC | PRN
  Administered 2019-09-27 (×3): 80 ug via INTRAVENOUS

## 2019-09-27 MED ORDER — ACETAMINOPHEN 160 MG/5ML PO SOLN: 325.0000 mg | ORAL | Status: DC | PRN

## 2019-09-27 MED ORDER — MEPERIDINE HCL 25 MG/ML IJ SOLN: 6.2500 mg | INTRAMUSCULAR | Status: DC | PRN

## 2019-09-27 MED ORDER — ONDANSETRON HCL 4 MG/2ML IJ SOLN
4.0000 mg | Freq: Four times a day (QID) | INTRAMUSCULAR | Status: DC | PRN
  Administered 2019-09-27: 4 mg via INTRAVENOUS
  Filled 2019-09-27: qty 2

## 2019-09-27 MED ORDER — ONDANSETRON HCL 4 MG PO TABS: 4.0000 mg | ORAL_TABLET | Freq: Four times a day (QID) | ORAL | Status: DC | PRN

## 2019-09-27 MED ORDER — SODIUM CHLORIDE 0.9 % IV SOLN: INTRAVENOUS | Status: DC

## 2019-09-27 MED ORDER — LACTATED RINGERS IV SOLN: INTRAVENOUS | Status: DC

## 2019-09-27 MED ORDER — FENTANYL CITRATE (PF) 100 MCG/2ML IJ SOLN
25.0000 ug | INTRAMUSCULAR | Status: DC | PRN
  Administered 2019-09-27 (×2): 25 ug via INTRAVENOUS

## 2019-09-27 MED ORDER — SUGAMMADEX SODIUM 200 MG/2ML IV SOLN
INTRAVENOUS | Status: DC | PRN
  Administered 2019-09-27: 200 mg via INTRAVENOUS

## 2019-09-27 MED ORDER — ACETAMINOPHEN 650 MG RE SUPP: 650.0000 mg | RECTAL | Status: DC | PRN

## 2019-09-27 MED ORDER — DULOXETINE HCL 20 MG PO CPEP
40.0000 mg | ORAL_CAPSULE | Freq: Every day | ORAL | Status: DC
  Filled 2019-09-27 (×2): qty 2

## 2019-09-27 MED ORDER — MENTHOL 3 MG MT LOZG: 1.0000 | LOZENGE | OROMUCOSAL | Status: DC | PRN

## 2019-09-27 MED ORDER — OXYCODONE HCL 5 MG PO TABS
5.0000 mg | ORAL_TABLET | ORAL | Status: DC | PRN
  Administered 2019-09-27 – 2019-09-28 (×5): 5 mg via ORAL
  Filled 2019-09-27 (×5): qty 1

## 2019-09-27 MED ORDER — OXYCODONE HCL 5 MG/5ML PO SOLN: 5.0000 mg | Freq: Once | ORAL | Status: AC | PRN

## 2019-09-27 MED ORDER — ROCURONIUM BROMIDE 10 MG/ML (PF) SYRINGE
PREFILLED_SYRINGE | INTRAVENOUS | Status: DC | PRN
  Administered 2019-09-27: 50 mg via INTRAVENOUS

## 2019-09-27 MED ORDER — DEXAMETHASONE SODIUM PHOSPHATE 4 MG/ML IJ SOLN
INTRAMUSCULAR | Status: DC | PRN
  Administered 2019-09-27: 10 mg via INTRAVENOUS

## 2019-09-27 MED ORDER — ACETAMINOPHEN 325 MG PO TABS
650.0000 mg | ORAL_TABLET | ORAL | Status: DC | PRN
  Administered 2019-09-27 – 2019-09-28 (×3): 650 mg via ORAL
  Filled 2019-09-27 (×3): qty 2

## 2019-09-27 MED ORDER — DEXAMETHASONE SODIUM PHOSPHATE 10 MG/ML IJ SOLN
INTRAMUSCULAR | Status: AC
  Filled 2019-09-27: qty 1

## 2019-09-27 SURGICAL SUPPLY — 41 items
BENZOIN TINCTURE PRP APPL 2/3 (GAUZE/BANDAGES/DRESSINGS) ×2 IMPLANT
BUR ROUND FLUTED 4 SOFT TCH (BURR) IMPLANT
CANISTER SUCT 3000ML PPV (MISCELLANEOUS) ×2 IMPLANT
CLSR STERI-STRIP ANTIMIC 1/2X4 (GAUZE/BANDAGES/DRESSINGS) ×2 IMPLANT
COVER SURGICAL LIGHT HANDLE (MISCELLANEOUS) ×2 IMPLANT
COVER WAND RF STERILE (DRAPES) ×2 IMPLANT
DECANTER SPIKE VIAL GLASS SM (MISCELLANEOUS) ×2 IMPLANT
DRAPE HALF SHEET 40X57 (DRAPES) ×4 IMPLANT
DRAPE MICROSCOPE LEICA (MISCELLANEOUS) ×2 IMPLANT
DRAPE SURG 17X23 STRL (DRAPES) ×2 IMPLANT
DRSG MEPILEX BORDER 4X4 (GAUZE/BANDAGES/DRESSINGS) ×2 IMPLANT
DURAPREP 26ML APPLICATOR (WOUND CARE) ×2 IMPLANT
ELECT REM PT RETURN 9FT ADLT (ELECTROSURGICAL) ×2
ELECTRODE REM PT RTRN 9FT ADLT (ELECTROSURGICAL) ×1 IMPLANT
GLOVE BIOGEL PI IND STRL 8 (GLOVE) ×2 IMPLANT
GLOVE BIOGEL PI INDICATOR 8 (GLOVE) ×2
GLOVE ORTHO TXT STRL SZ7.5 (GLOVE) ×4 IMPLANT
GOWN STRL REUS W/ TWL LRG LVL3 (GOWN DISPOSABLE) ×2 IMPLANT
GOWN STRL REUS W/ TWL XL LVL3 (GOWN DISPOSABLE) ×1 IMPLANT
GOWN STRL REUS W/TWL 2XL LVL3 (GOWN DISPOSABLE) ×2 IMPLANT
GOWN STRL REUS W/TWL LRG LVL3 (GOWN DISPOSABLE) ×4
GOWN STRL REUS W/TWL XL LVL3 (GOWN DISPOSABLE) ×2
KIT BASIN OR (CUSTOM PROCEDURE TRAY) ×2 IMPLANT
KIT TURNOVER KIT B (KITS) ×2 IMPLANT
MANIFOLD NEPTUNE II (INSTRUMENTS) ×2 IMPLANT
NEEDLE HYPO 25GX1X1/2 BEV (NEEDLE) ×2 IMPLANT
NEEDLE SPNL 18GX3.5 QUINCKE PK (NEEDLE) ×2 IMPLANT
NS IRRIG 1000ML POUR BTL (IV SOLUTION) ×2 IMPLANT
PACK LAMINECTOMY ORTHO (CUSTOM PROCEDURE TRAY) ×2 IMPLANT
PAD ARMBOARD 7.5X6 YLW CONV (MISCELLANEOUS) ×4 IMPLANT
PATTIES SURGICAL .5 X.5 (GAUZE/BANDAGES/DRESSINGS) IMPLANT
PATTIES SURGICAL .75X.75 (GAUZE/BANDAGES/DRESSINGS) IMPLANT
SUT VIC AB 0 CT1 27 (SUTURE)
SUT VIC AB 0 CT1 27XBRD ANBCTR (SUTURE) IMPLANT
SUT VIC AB 1 CTX 36 (SUTURE) ×4
SUT VIC AB 1 CTX36XBRD ANBCTR (SUTURE) ×2 IMPLANT
SUT VIC AB 2-0 CT1 27 (SUTURE) ×2
SUT VIC AB 2-0 CT1 TAPERPNT 27 (SUTURE) ×1 IMPLANT
SUT VIC AB 3-0 X1 27 (SUTURE) ×4 IMPLANT
TOWEL GREEN STERILE (TOWEL DISPOSABLE) ×2 IMPLANT
TOWEL GREEN STERILE FF (TOWEL DISPOSABLE) ×2 IMPLANT

## 2019-09-27 NOTE — Interval H&P Note (Signed)
History and Physical Interval Note:  09/27/2019 12:23 PM  Caitlin Valdez  has presented today for surgery, with the diagnosis of L5-S1 disc protrusion, radiculopathy.  The various methods of treatment have been discussed with the patient and family. After consideration of risks, benefits and other options for treatment, the patient has consented to  Procedure(s): RIGHT L5-S1 MICRODISCECTOMY (N/A) as a surgical intervention.  The patient's history has been reviewed, patient examined, no change in status, stable for surgery.  I have reviewed the patient's chart and labs.  Questions were answered to the patient's satisfaction.     Marybelle Killings

## 2019-09-27 NOTE — Discharge Instructions (Addendum)
Ok to shower 5 days postop.  Do not apply any creams or ointments to incision.  Do not remove steri-strips.  Can use 4x4 gauze and tape for dressing changes.  No aggressive activity.  No bending, twisting,  squatting or prolonged sitting.  Mostly be in reclined position or lying down.  Walk daily slowly progressing with a goal of 2 miles after a few weeks.   No driving

## 2019-09-27 NOTE — H&P (Signed)
Caitlin Valdez is an 49 y.o. female.   Chief Complaint: low back pain and right LE radiculopathy HPI: 49 year old black female history of right L5-S1 HNP comes in for preop evaluation.  Patient states that she continues to have ongoing low back pain and right lower extremity radiculopathy but also over the last couple weeks she has been having pain radiating down the left leg as well to her foot.  Right leg worse than left.  We have not yet received preop medical clearance from Dr. Antony Blackbird.  There is a note in the system from Dr. Chapman Fitch stating that patient will also be needing preop cardiac clearance.    Past Medical History:  Diagnosis Date  . Abdominal pain, right lower quadrant   . Anxiety   . Depression   . Fibromyalgia   . GERD (gastroesophageal reflux disease)   . Migraine   . Obesity   . Peripheral vascular disease (HCC)    RIGHT LEG  . PONV (postoperative nausea and vomiting)   . Stroke ([place])   . Trichomonas 2012    Past Surgical History:  Procedure Laterality Date  . CESAREAN SECTION    . FRACTURE SURGERY    . HYSTEROSCOPY WITH NOVASURE N/A 08/08/2014   Procedure:  NOVASURE;  Surgeon: Emily Filbert, MD;  Location: [facility];  Service: Gynecology;  Laterality: N/A;  . VEIN SURGERY      Family History  Problem Relation Age of Onset  . Diabetes Mother   . Hypertension Mother   . Breast cancer Mother 36  . Diabetes Father   . Hypertension Father   . Diabetes Other   . Hypertension Other   . Other Neg Hx    Social History:  reports that she has been smoking cigarettes. She has a 5.00 pack-year smoking history. She has never used smokeless tobacco. She reports current drug use. Drug: Marijuana. She reports that she does not drink alcohol.  Allergies: No Known Allergies  No medications prior to admission.    No results found for this or any previous visit (from the past 48 hour(s)). No results found.  Review of Systems  Constitutional: Negative.   HENT: Negative.    Respiratory: Negative.   Cardiovascular: Negative.   Gastrointestinal: Positive for abdominal pain.  Genitourinary: Negative.   Musculoskeletal: Positive for back pain.  Neurological: Positive for numbness.  Psychiatric/Behavioral: Negative.     There were no vitals taken for this visit. Physical Exam  Constitutional: She is oriented to person, place, and time. She appears well-developed.  HENT:  Head: Normocephalic and atraumatic.  Cardiovascular: Normal heart sounds.  Respiratory: Effort normal. No respiratory distress. She has no wheezes.  GI: There is abdominal tenderness.  Neurological: She is alert and oriented to person, place, and time.  Skin: Skin is warm and dry.     Assessment/Plan Right L5-S1 HNP  We will proceed with right L5-S1 microdiscectomy as scheduled. All questions answered.    Benjiman Core, PA-C 09/27/2019, 6:38 AM

## 2019-09-27 NOTE — Op Note (Signed)
Preop diagnosis: Right L5-S1 paracentral disc protrusion with radiculopathy  Postop diagnosis: Same  Procedure: Right L5-S1 microdiscectomy.  Surgeon: Rodell Perna, MD  Assistant: Benjiman Core, PA-C medically necessary and present for the entire procedure  Anesthesia General plus Marcaine local  EBL: Minimal  Procedure: After induction general anesthesia preoperative Ancef prophylaxis patient was placed prone on chest rolls arms at 9090 and yellow foam pads.  Calf pumpers were used.  Back was prepped with DuraPrep squared with towels sterile skin marker Betadine Steri-Drape laminectomy sheet and draped.  Spinal needle placed at 5 1 confirmed with a lateral plain radiograph image.  After timeout procedure incision was made at midline L5-S1.  Subperiosteal dissection on the right side with Taylor retractor placed initially.  [place] tractor was too long short popped out several times so we switched to a Set designer.  Second x-ray was taken with a Penfield 4 at the interlaminar space L5-S1 confirmed with the x-ray and lamina was thinned S1 and L5 with the bur.  3 mm Kerrison was used to remove the bone and chunks of ligament until the dura was exposed.  There was protrusion of the disc prominently.  In the lateral gutter bipolar cautery was used to coagulate some veins.  Annulus was incised with the D'Errico protecting the dura and passes were made.  There was a central area that was protruding as a small mound pushing up against the nerve root.  Once this was incised and all pressure applied with a drinker oh to the disc fragments and disc extruded which were removed.  Passes were made with straight micropituitary microupbiter [employer] and regular and up-biting pituitaries.  All were without teeth.  Disc was decompressed until a Woodson could be slid anterior to the dura across the midline without areas of disc protrusion.  Bone is moved out to the level of the pedicle  nerve root was free.  Some ligament was TRAM cephalad.  Epidural space was dry irrigation with saline solution and then standard layered closure with #1 Vicryl 2-0 Vicryl subtendinous tissue subcuticular closure tincture of benzoin marking infiltration postop dressing and transferred recovery room.

## 2019-09-27 NOTE — Transfer of Care (Signed)
Immediate Anesthesia Transfer of Care Note  Patient: Caitlin Valdez  Procedure(s) Performed: RIGHT LUMBAR FIVE TO SACRAL ONE MICRODISCECTOMY (N/A Spine Lumbar)  Patient Location: PACU  Anesthesia Type:General  Level of Consciousness: awake, alert  and oriented  Airway & Oxygen Therapy: Patient Spontanous Breathing and Patient connected to face mask oxygen  Post-op Assessment: Report given to RN and Post -op Vital signs reviewed and stable  Post vital signs: Reviewed and stable  Last Vitals:  Vitals Value Taken Time  BP 114/81 09/27/19 1406  Temp    Pulse 107 09/27/19 1410  Resp 9 09/27/19 1410  SpO2 100 % 09/27/19 1410  Vitals shown include unvalidated device data.  Last Pain:  Vitals:   09/27/19 1044  TempSrc:   PainSc: 10-Worst pain ever      Patients Stated Pain Goal: 3 ([id number])  Complications: No apparent anesthesia complications

## 2019-09-27 NOTE — Anesthesia Procedure Notes (Signed)
Procedure Name: Intubation Date/Time: 09/27/2019 12:41 PM Performed by: Eulas Post, Keian Odriscoll W, CRNA Pre-anesthesia Checklist: Patient identified, Emergency Drugs available, Suction available and Patient being monitored Patient Re-evaluated:Patient Re-evaluated prior to induction Oxygen Delivery Method: Circle system utilized Preoxygenation: Pre-oxygenation with 100% oxygen Induction Type: IV induction Ventilation: Mask ventilation without difficulty Laryngoscope Size: Miller and 2 Grade View: Grade I Tube type: Oral Tube size: 7.0 mm Number of attempts: 1 Airway Equipment and Method: Stylet Placement Confirmation: ETT inserted through vocal cords under direct vision,  positive ETCO2 and breath sounds checked- equal and bilateral Secured at: 22 cm Tube secured with: Tape Dental Injury: Teeth and Oropharynx as per pre-operative assessment

## 2019-09-27 NOTE — Evaluation (Signed)
Physical Therapy Evaluation Patient Details Name: Caitlin Valdez MRN: [id number] DOB: March 01, 1971 Today's Date: 09/27/2019   History of Present Illness  pt is a 49 y/o female with h/o PVD, stroke admitted with HNP with right LE radiculopathy, pt s/p  L5/S1 microdiscectomy R.  Clinical Impression  Pt admitted with/for radicular pain due to R HNP, s/p microdiscectomy.  Pt stiff and painful needing min to moderate assist.  Education initiated, but pt/family need reinforcement.  Pt currently limited functionally due to the problems listed below.  (see problems list.)  Pt will benefit from PT to maximize function and safety to be able to get home safely with available assist.     Follow Up Recommendations No PT follow up(TBA post op day 1)    Equipment Recommendations  Rolling walker with 5" wheels    Recommendations for Other Services       Precautions / Restrictions Precautions Precautions: Back Restrictions Weight Bearing Restrictions: No      Mobility  Bed Mobility Overal bed mobility: Needs Assistance Bed Mobility: Rolling;Sidelying to Sit;Sit to Sidelying Rolling: Min assist Sidelying to sit: Mod assist     Sit to sidelying: Mod assist General bed mobility comments: cues for best technique, truncal assist.  Transfers Overall transfer level: Needs assistance Equipment used: Rolling walker (2 wheeled) Transfers: Sit to/from Stand Sit to Stand: Min assist         General transfer comment: cues for hand placement, boost assist  Ambulation/Gait Ambulation/Gait assistance: Min guard Gait Distance (Feet): 40 Feet Assistive device: Rolling walker (2 wheeled) Gait Pattern/deviations: Step-through pattern;Step-to pattern   Gait velocity interpretation: <1.31 ft/sec, indicative of household ambulator General Gait Details: small guarded steps.  Stiff posture, slower cadence.  Stairs            Wheelchair Mobility    Modified Rankin (Stroke Patients Only)        Balance Overall balance assessment: Mild deficits observed, not formally tested                                           Pertinent Vitals/Pain Pain Assessment: Faces Faces Pain Scale: Hurts whole lot Pain Location: incisional pain Pain Descriptors / Indicators: Burning;Guarding;Grimacing;Sore Pain Intervention(s): Monitored during session;Limited activity within patient's tolerance    Home Living Family/patient expects to be discharged to:: Private residence Living Arrangements: Alone Available Help at Discharge: Family;Available 24 hours/day(for a few days) Type of Home: Apartment Home Access: Level entry     Home Layout: One level Home Equipment: Shower seat      Prior Function Level of Independence: Independent         Comments: 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 Extremity Assessment Upper Extremity Assessment: Overall WFL for tasks assessed    Lower Extremity Assessment Lower Extremity Assessment: Overall WFL for tasks assessed;RLE deficits/detail(general weakness R>L)       Communication   Communication: No difficulties  Cognition Arousal/Alertness: Awake/alert Behavior During Therapy: WFL for tasks assessed/performed Overall Cognitive Status: Within Functional Limits for tasks assessed                                        General Comments General comments (skin integrity,  edema, etc.): pt advised on back care/prec, log roll/transitions, lifting precautions and progression of activity.    Exercises     Assessment/Plan    PT Assessment Patient needs continued PT services  PT Problem List Decreased strength;Decreased activity tolerance;Decreased mobility;Decreased knowledge of use of DME;Decreased knowledge of precautions;Pain       PT Treatment Interventions DME instruction;Gait training;Stair training;Functional mobility training;Therapeutic  activities;Patient/family education    PT Goals (Current goals can be found in the Care Plan section)  Acute Rehab PT Goals Patient Stated Goal: Home and independent quickly.  Able to work PT Goal Formulation: With patient Time For Goal Achievement: 09/30/19 Potential to Achieve Goals: Good    Frequency Min 5X/week   Barriers to discharge        Co-evaluation               AM-PAC PT "6 Clicks" Mobility  Outcome Measure Help needed turning from your back to your side while in a flat bed without using bedrails?: A Little Help needed moving from lying on your back to sitting on the side of a flat bed without using bedrails?: A Lot Help needed moving to and from a bed to a chair (including a wheelchair)?: A Little Help needed standing up from a chair using your arms (e.g., wheelchair or bedside chair)?: A Little Help needed to walk in hospital room?: A Little Help needed climbing 3-5 steps with a railing? : A Lot 6 Click Score: 16    End of Session   Activity Tolerance: Patient tolerated treatment well;Patient limited by pain Patient left: in bed;with call bell/phone within reach;with family/visitor present Nurse Communication: Mobility status PT Visit Diagnosis: Other abnormalities of gait and mobility (R26.89);Pain Pain - Right/Left: Right Pain - part of body: (back)    Time: [id number] PT Time Calculation (min) (ACUTE ONLY): 27 min   Charges:   PT Evaluation $PT Eval Moderate Complexity: 1 Mod PT Treatments $Gait Training: 8-22 mins        09/27/2019  Ginger Carne., PT Acute Rehabilitation Services [phone]  (pager) [phone]  (office)  Caitlin Valdez 09/27/2019, 6:37 PM

## 2019-09-27 NOTE — Progress Notes (Addendum)
Pt with uncontrollable back spasms and in visible pain. Per order from Dr. Ambrose Pancoast. Pt can have 46mcg of fentanyl. O2 sats 100% on RA.   Jacqlyn Larsen, RN

## 2019-09-28 ENCOUNTER — Telehealth: Payer: Self-pay | Admitting: Orthopaedic Surgery

## 2019-09-28 DIAGNOSIS — M5117 Intervertebral disc disorders with radiculopathy, lumbosacral region: Secondary | ICD-10-CM | POA: Diagnosis not present

## 2019-09-28 NOTE — Telephone Encounter (Signed)
Received call from pt inquiring if we received forms from [employer]. I advised her we have not. She is calling them back now to have them resend as they were to have sent her paperwork 2 weeks ago. I gave her both our fax numbers.

## 2019-09-28 NOTE — Anesthesia Postprocedure Evaluation (Signed)
Anesthesia Post Note  Patient: Caitlin Valdez  Procedure(s) Performed: RIGHT LUMBAR FIVE TO SACRAL ONE MICRODISCECTOMY (N/A Spine Lumbar)     Patient location during evaluation: PACU Anesthesia Type: General Level of consciousness: awake and alert Pain management: pain level controlled Vital Signs Assessment: post-procedure vital signs reviewed and stable Respiratory status: spontaneous breathing, nonlabored ventilation, respiratory function stable and patient connected to nasal cannula oxygen Cardiovascular status: blood pressure returned to baseline and stable Postop Assessment: no apparent nausea or vomiting Anesthetic complications: no    Last Vitals:  Vitals:   09/27/19 2308 09/28/19 0326  BP: 118/67 112/68  Pulse: 70 69  Resp: 20 20  Temp: 36.8 C 37 C  SpO2: 98% 100%    Last Pain:  Vitals:   09/28/19 0638  TempSrc:   PainSc: 6    Pain Goal: Patients Stated Pain Goal: 3 (09/28/19 [id number])                 Syra Sirmons

## 2019-09-28 NOTE — Evaluation (Signed)
Occupational Therapy Evaluation Patient Details Name: Caitlin Valdez MRN: [id number] DOB: June 18, 1971 Today's Date: 09/28/2019    History of Present Illness pt is a 49 y/o female with h/o PVD, stroke admitted with HNP with right LE radiculopathy, pt s/p  L5/S1 microdiscectomy R.   Clinical Impression   PTA patient independent and working, admitted for above and limited by pain and precaution adherence.  Patient currently completing transfers with supervision, ADLs with supervision/setup assist, and in room mobility using RW with supervision.  Educated on back precautions, ADL compensatory techniques, recommendations, mobilization, DME and safety.  Continues to require min cueing for back precaution adherence during session.  Reports she will have 24/7 support initially, agreeable to recommendations for support when showering and having assist moving 3:1 from shower to commode. She will benefit from further OT services acutely to optimize independence and safety with ADLs, mobility but anticipate no further needs after dc home.     Follow Up Recommendations  No OT follow up;Supervision/Assistance - 24 hour(inital)    Equipment Recommendations  3 in 1 bedside commode;Other (comment)(RW)    Recommendations for Other Services       Precautions / Restrictions Precautions Precautions: Back Precaution Booklet Issued: Yes (comment) Precaution Comments: reviewed with pt, requires min cueing to adhere functionally  Required Braces or Orthoses: (no brace needed order) Restrictions Weight Bearing Restrictions: No      Mobility Bed Mobility Overal bed mobility: Needs Assistance Bed Mobility: Rolling;Sit to Sidelying Rolling: Supervision       Sit to sidelying: Supervision General bed mobility comments: increased time, min cueing for log roll technique but no phyiscal assist   Transfers Overall transfer level: Needs assistance Equipment used: Rolling walker (2 wheeled) Transfers: Sit  to/from Stand Sit to Stand: Supervision         General transfer comment: supervision for safety, cueing for hand placement and body mechanics     Balance Overall balance assessment: Mild deficits observed, not formally tested                                         ADL either performed or assessed with clinical judgement   ADL Overall ADL's : Needs assistance/impaired     Grooming: Supervision/safety;Standing;Cueing for compensatory techniques   Upper Body Bathing: Set up;Sitting   Lower Body Bathing: Supervison/ safety;Sit to/from stand;Cueing for back precautions;Cueing for compensatory techniques Lower Body Bathing Details (indicate cue type and reason): figure 4 technique seated for LB bathing  Upper Body Dressing : Set up;Sitting   Lower Body Dressing: Supervision/safety;Cueing for compensatory techniques;Cueing for back precautions;Sit to/from stand Lower Body Dressing Details (indicate cue type and reason): figure 4 technique to thread LEs in to pants, supervision sit to stand; educated on compensatory techniques  Toilet Transfer: Supervision/safety;Ambulation;RW;Grab bars Toilet Transfer Details (indicate cue type and reason): cueing for hand placement and precautions adherence  Toileting- Clothing Manipulation and Hygiene: Supervision/safety;Sit to/from stand;Sitting/lateral lean;Cueing for back precautions;Cueing for compensatory techniques Toileting - Clothing Manipulation Details (indicate cue type and reason): cueing to avoid twisting and bending, educated on compensatory techniques    Tub/Shower Transfer Details (indicate cue type and reason): educated on having support for transfer, using grabbars/RW and 3:1 commode to sit in shower Functional mobility during ADLs: Supervision/safety;Rolling walker General ADL Comments: pt educated on back precautions, recommendations, ADl compensatory techniques and safety     Vision  Perception      Praxis      Pertinent Vitals/Pain Pain Assessment: Faces Faces Pain Scale: Hurts even more Pain Location: incisional pain Pain Descriptors / Indicators: Discomfort;Grimacing;Operative site guarding Pain Intervention(s): Monitored during session;Repositioned;Limited activity within patient's tolerance     Hand Dominance     Extremity/Trunk Assessment Upper Extremity Assessment Upper Extremity Assessment: Overall WFL for tasks assessed   Lower Extremity Assessment Lower Extremity Assessment: Defer to PT evaluation   Cervical / Trunk Assessment Cervical / Trunk Assessment: Other exceptions Cervical / Trunk Exceptions: s/p back surgery    Communication Communication Communication: No difficulties   Cognition Arousal/Alertness: Awake/alert Behavior During Therapy: WFL for tasks assessed/performed Overall Cognitive Status: Within Functional Limits for tasks assessed                                     General Comments       Exercises     Shoulder Instructions      Home Living Family/patient expects to be discharged to:: Private residence Living Arrangements: Alone Available Help at Discharge: Family;Available 24 hours/day(initally ) Type of Home: Apartment Home Access: Level entry     Home Layout: One level     Bathroom Shower/Tub: Teacher, early years/pre: Standard     Home Equipment: None          Prior Functioning/Environment Level of Independence: Independent        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   Able to work OT Goal Formulation: With patient Time For Goal Achievement: 10/12/19 Potential to Achieve Goals: Good  OT Frequency: Min 2X/week   Barriers to D/C:            Co-evaluation              AM-PAC OT "6 Clicks" Daily Activity     Outcome Measure Help from another person eating meals?: None Help from another person taking care of personal grooming?: A Little Help from another person toileting, which includes using toliet, bedpan, or urinal?: A Little Help from another person bathing (including washing, rinsing, drying)?: A Little Help from another person to put on and taking off regular upper body clothing?: A Little Help from another person to put on and taking off regular lower body clothing?: A Little 6 Click Score: 19   End of Session Equipment Utilized During Treatment: Rolling walker Nurse Communication: Mobility status  Activity Tolerance: Patient tolerated treatment well Patient left: in bed;with call bell/phone within reach  OT Visit Diagnosis: Pain Pain - part of body: (back)                Time: [id number] OT Time Calculation (min): 21 min Charges:  OT General Charges $OT Visit: 1 Visit OT Evaluation $OT Eval Low Complexity: 1 Low  Jolaine Artist, OT Acute Rehabilitation Services Pager [phone] Office [phone]   Delight Stare 09/28/2019, 10:14 AM

## 2019-09-28 NOTE — Progress Notes (Signed)
   Subjective: 1 Day Post-Op Procedure(s) (LRB): RIGHT LUMBAR FIVE TO SACRAL ONE MICRODISCECTOMY (N/A) Patient reports pain as better than before surgery but it still hurts some.    Objective: Vital signs in last 24 hours: Temp:  [97.5 F (36.4 C)-98.6 F (37 C)] 98.2 F (36.8 C) (03/16 0720) Pulse Rate:  [61-117] 61 (03/16 0720) Resp:  [12-20] 16 (03/16 0720) BP: (112-147)/(67-92) 134/77 (03/16 0720) SpO2:  [96 %-100 %] 98 % (03/16 0720) Weight:  [81.6 kg] 81.6 kg (03/15 1026)  Intake/Output from previous day: 03/15 0701 - 03/16 0700 In: 1320 [P.O.:120; I.V.:1100] Out: 100 [Blood:100] Intake/Output this shift: No intake/output data recorded.  No results for input(s): HGB in the last 72 hours. No results for input(s): WBC, RBC, HCT, PLT in the last 72 hours. No results for input(s): NA, K, CL, CO2, BUN, CREATININE, GLUCOSE, CALCIUM in the last 72 hours. No results for input(s): LABPT, INR in the last 72 hours.  Neurologically intact DG Lumbar Spine 2-3 Views  Result Date: 09/27/2019 CLINICAL DATA:  Intraoperative localization for microdiscectomy. EXAM: LUMBAR SPINE - 2-3 VIEW COMPARISON:  None. FINDINGS: Cross-table lateral radiograph #1 shows a needle overlying the posterior interspinous space at L5-S1. Cross-table lateral radiograph #2 shows posterior retractors with probe overlying the posterior interspinous space at L5-S1. IMPRESSION: Intraoperative localization of L5-S1. Electronically Signed   By: Marlaine Hind M.D.   On: 09/27/2019 15:52    Assessment/Plan: 1 Day Post-Op Procedure(s) (LRB): RIGHT LUMBAR FIVE TO SACRAL ONE MICRODISCECTOMY (N/A) Plan: discharge Home.   Caitlin Valdez 09/28/2019, 7:49 AM

## 2019-09-28 NOTE — Plan of Care (Signed)
Patient alert and oriented, mae's well, voiding adequate amount of urine, swallowing without difficulty, no c/o pain at time of discharge. Patient discharged home with family. Script and discharged instructions given to patient. Patient and family stated understanding of instructions given. Patient has an appointment with Dr. Yates  

## 2019-09-28 NOTE — Progress Notes (Signed)
Physical Therapy Treatment Patient Details Name: Caitlin Valdez MRN: [id number] DOB: [birth year] Today's Date: 09/28/2019    History of Present Illness pt is a 49 y/o female with h/o PVD, stroke admitted with HNP with right LE radiculopathy, pt s/p  L5/S1 microdiscectomy R.    PT Comments    Pt progressing well towards all goals. Pt with improved ambulation tolerance and transfer ability. Pt able to recall precautions. Pt remains dependent on RW for safe ambulation and optimal posture. Acute PT to cont to follow.    Follow Up Recommendations  No PT follow up     Equipment Recommendations  Rolling walker with 5" wheels;3in1 (PT)    Recommendations for Other Services       Precautions / Restrictions Precautions Precautions: Back Precaution Booklet Issued: Yes (comment) Precaution Comments: pt with good recall, verbal cues for adherence Required Braces or Orthoses: (no brace needed order) Restrictions Weight Bearing Restrictions: No    Mobility  Bed Mobility Overal bed mobility: Needs Assistance Bed Mobility: Rolling;Sit to Sidelying Rolling: Supervision       Sit to sidelying: Supervision General bed mobility comments: pt up at EOB upon PT arrival  Transfers Overall transfer level: Needs assistance Equipment used: Rolling walker (2 wheeled) Transfers: Sit to/from Stand Sit to Stand: Supervision         General transfer comment: verbal cues for hand placement, increased time, verbal cues to minimize trunk flexion  Ambulation/Gait Ambulation/Gait assistance: Supervision Gait Distance (Feet): 200 Feet Assistive device: Rolling walker (2 wheeled) Gait Pattern/deviations: Step-through pattern;Step-to pattern Gait velocity: slow Gait velocity interpretation: 1.31 - 2.62 ft/sec, indicative of limited community ambulator General Gait Details: verbal cues to relax shoulders multiple times and to decrease UE WBing. attempted to amb without AD however pt with short step  to steps and antalgia with increased back pain   Stairs             Wheelchair Mobility    Modified Rankin (Stroke Patients Only)       Balance Overall balance assessment: Mild deficits observed, not formally tested                                          Cognition Arousal/Alertness: Awake/alert Behavior During Therapy: WFL for tasks assessed/performed Overall Cognitive Status: Within Functional Limits for tasks assessed                                        Exercises      General Comments General comments (skin integrity, edema, etc.): VSS      Pertinent Vitals/Pain Pain Assessment: 0-10 Pain Score: 8  Faces Pain Scale: Hurts even more Pain Location: incisional pain Pain Descriptors / Indicators: Discomfort;Grimacing;Operative site guarding Pain Intervention(s): Monitored during session    Home Living Family/patient expects to be discharged to:: Private residence Living Arrangements: Alone Available Help at Discharge: Family;Available 24 hours/day(initally ) Type of Home: Apartment Home Access: Level entry   Home Layout: One level Home Equipment: None      Prior Function Level of Independence: Independent      Comments: 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  Able to  work Progress towards PT goals: Progressing toward goals    Frequency    Min 5X/week      PT Plan Current plan remains appropriate    Co-evaluation              AM-PAC PT "6 Clicks" Mobility   Outcome Measure  Help needed turning from your back to your side while in a flat bed without using bedrails?: A Little Help needed moving from lying on your back to sitting on the side of a flat bed without using bedrails?: A Little Help needed moving to and from a bed to a chair (including a wheelchair)?: A  Little Help needed standing up from a chair using your arms (e.g., wheelchair or bedside chair)?: A Little Help needed to walk in hospital room?: A Little Help needed climbing 3-5 steps with a railing? : A Little 6 Click Score: 18    End of Session Equipment Utilized During Treatment: Gait belt Activity Tolerance: Patient tolerated treatment well;Patient limited by pain Patient left: in bed;with call bell/phone within reach(sitting EOB with OT) Nurse Communication: Mobility status PT Visit Diagnosis: Other abnormalities of gait and mobility (R26.89);Pain Pain - Right/Left: Right     Time: [id number] PT Time Calculation (min) (ACUTE ONLY): 19 min  Charges:  $Gait Training: 8-22 mins                     Kittie Plater, PT, DPT Acute Rehabilitation Services Pager #: [phone] Office #: [phone]    Berline Lopes 09/28/2019, 10:22 AM

## 2019-10-01 ENCOUNTER — Telehealth: Payer: Self-pay | Admitting: Orthopaedic Surgery

## 2019-10-01 NOTE — Telephone Encounter (Signed)
noted 

## 2019-10-01 NOTE — Telephone Encounter (Signed)
Patient called. She would like something stronger for pain. Her call back number is [phone]

## 2019-10-01 NOTE — Telephone Encounter (Signed)
Patient called back and stated was sleep and missed Dr. Lorin Mercy Call.  Wants to know if he will call back.  Please call patient to advise267 [phone]

## 2019-10-01 NOTE — Telephone Encounter (Signed)
She has been sleeping. Dressing was changed and she passed out . EMS saw her and she did not have to go in. No BM yet discussed.

## 2019-10-01 NOTE — Telephone Encounter (Signed)
Patient requests return call.

## 2019-10-01 NOTE — Telephone Encounter (Signed)
I called times 2 , no one answering , may be out or asleep

## 2019-10-01 NOTE — Telephone Encounter (Signed)
Please advise 

## 2019-10-04 ENCOUNTER — Telehealth: Payer: Self-pay | Admitting: Orthopaedic Surgery

## 2019-10-04 NOTE — Telephone Encounter (Signed)
IC, advised forms received 3/19 and was sent to [employer]. But I have copy and Gwinda Passe will complete and we will return to [place] with requested notes today.

## 2019-10-04 NOTE — Telephone Encounter (Signed)
[employer] form & notes 08/31/19 faxed [phone]

## 2019-10-04 NOTE — Telephone Encounter (Signed)
Patient called asked for a call back concerning paperwork that was faxed over from [place] [employer] [place] last week. Patient said she want to know if the paperwork was received. The number to contact patient is [phone]

## 2019-10-05 ENCOUNTER — Ambulatory Visit (INDEPENDENT_AMBULATORY_CARE_PROVIDER_SITE_OTHER): Payer: BC Managed Care – PPO | Admitting: Orthopaedic Surgery

## 2019-10-05 ENCOUNTER — Encounter: Payer: Self-pay | Admitting: Orthopaedic Surgery

## 2019-10-05 ENCOUNTER — Telehealth: Payer: Self-pay | Admitting: Orthopaedic Surgery

## 2019-10-05 ENCOUNTER — Other Ambulatory Visit: Payer: Self-pay

## 2019-10-05 VITALS — BP 122/81 | HR 83 | Ht 64.0 in | Wt 180.0 lb

## 2019-10-05 DIAGNOSIS — M5126 Other intervertebral disc displacement, lumbar region: Secondary | ICD-10-CM

## 2019-10-05 NOTE — Telephone Encounter (Signed)
10/05/2019 ov note & work note faxed [place] [phone]

## 2019-10-05 NOTE — Progress Notes (Signed)
Post-Op Visit Note   Patient: Caitlin Valdez           Date of Birth: December 31, 1970           MRN: [id number] Visit Date: 10/05/2019 PCP: Antony Blackbird, MD   Assessment & Plan: Post right L5-S1 microdiscectomy for right paracentral disc protrusion.  She still has some pain in her back and legs.  She has about 50% of her pain medication left some problems with constipation when she is taken some medication over-the-counter which is helped.  She worked Paediatric nurse as a Scientist, water quality.  Work slip given no work x6 weeks recheck 5 weeks.  She is walking with a walker needs to progress from that to a cane and increase her ambulation distance.  Anterior tib gastrocsoleus is strong on manual resistive testing.  Chief Complaint:  Chief Complaint  Patient presents with  . Lower Back - Routine Post Op    09/27/2019 Right L5-S1 Microdiscectomy   Visit Diagnoses:  1. Protrusion of lumbar intervertebral disc     Plan: Incision looks good.  Subcuticular closure.  Increase walking distance return 5 weeks.  Follow-Up Instructions: Return in about 5 weeks (around 11/09/2019).   Orders:  No orders of the defined types were placed in this encounter.  No orders of the defined types were placed in this encounter.   Imaging: No results found.  PMFS History: Patient Active Problem List   Diagnosis Date Noted  . Protrusion of lumbar intervertebral disc 09/27/2019  . Low back pain 08/10/2019  . Radiculopathy due to lumbar intervertebral disc disorder 06/22/2019  . Anxiety and depression 03/17/2018  . Hematuria 06/27/2016  . S/P endometrial ablation 06/27/2016  . Lower abdominal pain 06/27/2016  . Migraine without status migrainosus, not intractable 06/27/2016  . Vitamin D insufficiency 02/11/2016  . De Quervain's tenosynovitis, left 02/09/2016  . Vitamin B12 deficiency 10/17/2015  . Allergic rhinitis 12/29/2014  . Current smoker 11/22/2014  . Obesity 06/29/2014  . Spinal stenosis in cervical region  02/22/2014  . Fibromyalgia 07/15/2009   Past Medical History:  Diagnosis Date  . Abdominal pain, right lower quadrant   . Anxiety   . Depression   . Fibromyalgia   . GERD (gastroesophageal reflux disease)   . Migraine   . Obesity   . Peripheral vascular disease (HCC)    RIGHT LEG  . PONV (postoperative nausea and vomiting)   . Stroke ([place])   . Trichomonas 2012    Family History  Problem Relation Age of Onset  . Diabetes Mother   . Hypertension Mother   . Breast cancer Mother 53  . Diabetes Father   . Hypertension Father   . Diabetes Other   . Hypertension Other   . Other Neg Hx     Past Surgical History:  Procedure Laterality Date  . CESAREAN SECTION    . FRACTURE SURGERY    . HYSTEROSCOPY WITH NOVASURE N/A 08/08/2014   Procedure:  NOVASURE;  Surgeon: Emily Filbert, MD;  Location: [facility];  Service: Gynecology;  Laterality: N/A;  . LUMBAR LAMINECTOMY/DECOMPRESSION MICRODISCECTOMY N/A 09/27/2019   Procedure: RIGHT LUMBAR FIVE TO SACRAL ONE MICRODISCECTOMY;  Surgeon: Marybelle Killings, MD;  Location: [facility];  Service: Orthopedics;  Laterality: N/A;  . VEIN SURGERY     Social History   Occupational History  . Not on file  Tobacco Use  . Smoking status: Current Every Day Smoker    Packs/day: 1.00    Years: 5.00  Pack years: 5.00    Types: Cigarettes  . Smokeless tobacco: Never Used  Substance and Sexual Activity  . Alcohol use: No  . Drug use: Yes    Types: Marijuana    Comment: per patient  not often, not weekly  . Sexual activity: Yes    Birth control/protection: Condom

## 2019-10-05 NOTE — Discharge Summary (Signed)
Patient ID: Caitlin Valdez MRN: [id number] DOB/AGE: 1971/01/01 49 y.o.  Admit date: 09/27/2019 Discharge date: 10/05/2019  Admission Diagnoses:  Active Problems:   Radiculopathy due to lumbar intervertebral disc disorder   Protrusion of lumbar intervertebral disc   HNP (herniated nucleus pulposus), lumbar   Discharge Diagnoses:  Active Problems:   Radiculopathy due to lumbar intervertebral disc disorder   Protrusion of lumbar intervertebral disc   HNP (herniated nucleus pulposus), lumbar  status post Procedure(s): RIGHT LUMBAR FIVE TO SACRAL ONE MICRODISCECTOMY  Past Medical History:  Diagnosis Date  . Abdominal pain, right lower quadrant   . Anxiety   . Depression   . Fibromyalgia   . GERD (gastroesophageal reflux disease)   . Migraine   . Obesity   . Peripheral vascular disease (HCC)    RIGHT LEG  . PONV (postoperative nausea and vomiting)   . Stroke ([place])   . Trichomonas 2012    Surgeries: Procedure(s): RIGHT LUMBAR FIVE TO SACRAL ONE MICRODISCECTOMY on 09/27/2019   Consultants:   Discharged Condition: Improved  Hospital Course: Caitlin Valdez is an 49 y.o. female who was admitted 09/27/2019 for operative treatment of lumbar HNP. Patient failed conservative treatments (please see the history and physical for the specifics) and had severe unremitting pain that affects sleep, daily activities and work/hobbies. After pre-op clearance, the patient was taken to the operating room on 09/27/2019 and underwent  Procedure(s): RIGHT LUMBAR FIVE TO SACRAL ONE MICRODISCECTOMY.    Patient was given perioperative antibiotics:  Anti-infectives (From admission, onward)   Start     Dose/Rate Route Frequency Ordered Stop   09/27/19 1040  ceFAZolin (ANCEF) 2-4 GM/100ML-% IVPB    Note to Pharmacy: Barbie Haggis   : cabinet override      09/27/19 1040 09/27/19 1309   09/27/19 1030  ceFAZolin (ANCEF) IVPB 2g/100 mL premix     2 g 200 mL/hr over 30 Minutes Intravenous On call to O.R.  09/27/19 1021 09/27/19 1245       Patient was given sequential compression devices and early ambulation to prevent DVT.   Patient benefited maximally from hospital stay and there were no complications. At the time of discharge, the patient was urinating/moving their bowels without difficulty, tolerating a regular diet, pain is controlled with oral pain medications and they have been cleared by PT/OT.   Recent vital signs: No data found.   Recent laboratory studies: No results for input(s): WBC, HGB, HCT, PLT, NA, K, CL, CO2, BUN, CREATININE, GLUCOSE, INR, CALCIUM in the last 72 hours.  Invalid input(s): PT, 2   Discharge Medications:   Allergies as of 09/28/2019   No Known Allergies     Medication List    STOP taking these medications   aspirin-acetaminophen-caffeine 250-250-65 MG tablet Commonly known as: EXCEDRIN MIGRAINE   azithromycin 500 MG tablet Commonly known as: ZITHROMAX   oxyCODONE 5 MG immediate release tablet Commonly known as: Oxy IR/ROXICODONE     TAKE these medications   butalbital-acetaminophen-caffeine 50-325-40 MG tablet Commonly known as: FIORICET Take 1 tablet by mouth every 6 (six) hours as needed for headache.   cetirizine 10 MG tablet Commonly known as: ZYRTEC Take 1 tablet (10 mg total) by mouth at bedtime. To help with congestion What changed:   when to take this  reasons to take this  additional instructions   DULoxetine HCl 40 MG Cpep Take 40 mg by mouth daily.   gabapentin 300 MG capsule Commonly known as: NEURONTIN Take 300 mg  by mouth 2 (two) times daily.   guaiFENesin-dextromethorphan 100-10 MG/5ML syrup Commonly known as: ROBITUSSIN DM Take 5 mLs by mouth every 4 (four) hours as needed for cough.   methocarbamol 500 MG tablet Commonly known as: Robaxin Take 1 tablet (500 mg total) by mouth every 6 (six) hours as needed for muscle spasms.   Misc. Devices Misc Please provide patient with insurance approved custom  compression socks for PVD.   omeprazole 40 MG capsule Commonly known as: PRILOSEC Take 1 capsule (40 mg total) by mouth daily.   oxyCODONE-acetaminophen 5-325 MG tablet Commonly known as: PERCOCET/ROXICET Take 1-2 tablets by mouth every 4 (four) hours as needed for severe pain.   polyethylene glycol powder 17 GM/SCOOP powder Commonly known as: MiraLax Mix 17 gm with 8 or more ounces of fluid and drink daily to help with constipation; 1 month supply       Diagnostic Studies: DG Lumbar Spine 2-3 Views  Result Date: 09/27/2019 CLINICAL DATA:  Intraoperative localization for microdiscectomy. EXAM: LUMBAR SPINE - 2-3 VIEW COMPARISON:  None. FINDINGS: Cross-table lateral radiograph #1 shows a needle overlying the posterior interspinous space at L5-S1. Cross-table lateral radiograph #2 shows posterior retractors with probe overlying the posterior interspinous space at L5-S1. IMPRESSION: Intraoperative localization of L5-S1. Electronically Signed   By: Marlaine Hind M.D.   On: 09/27/2019 15:52    Discharge Instructions    Incentive spirometry RT   Complete by: As directed       Follow-up Information    Marybelle Killings, MD. Schedule an appointment as soon as possible for a visit today.   Specialty: Orthopedic Surgery Why: need return office visit one week postop Contact information: [place] [id number] [phone]           Discharge Plan:  discharge to home  Disposition:     Signed: Benjiman Core 10/05/2019, 9:18 AM

## 2019-10-11 ENCOUNTER — Telehealth: Payer: Self-pay | Admitting: Orthopaedic Surgery

## 2019-10-11 NOTE — Telephone Encounter (Signed)
Patient called asked for Gwinda Passe to call her back @ [phone]

## 2019-10-12 ENCOUNTER — Other Ambulatory Visit: Payer: Self-pay | Admitting: Orthopaedic Surgery

## 2019-10-12 ENCOUNTER — Telehealth: Payer: Self-pay | Admitting: Orthopaedic Surgery

## 2019-10-12 MED ORDER — HYDROCODONE-ACETAMINOPHEN 5-325 MG PO TABS: 1.0000 | ORAL_TABLET | Freq: Four times a day (QID) | ORAL | 0 refills | Status: DC | PRN

## 2019-10-12 NOTE — Telephone Encounter (Signed)
I sent in [place] number 20 tablets to [facility] that she uses.  When she was here last on the 23rd she said she had about half a bottle left of pain medicine and that is why I did not send it in at that time.  You call thanks

## 2019-10-12 NOTE — Telephone Encounter (Signed)
Patient states that a form was going to be sent here from [place] to be completed. Have you seen this form? This is different than her [place] information.

## 2019-10-12 NOTE — Telephone Encounter (Signed)
I called patient.  She was getting off of toilet this weekend and heard something "stretch" in back. She has been using heat and taking the pain medication with relief. She is using the cane and a walker when getting around as she still feels off balance.  Patient also states we were going to send in her last refill on pain medication when she was here in the office for her last follow up, however, the pharmacy does not have it.  She would like to have a refill of Oxycodone 5/325 sent to [name] on [address].  Please advise.

## 2019-10-12 NOTE — Telephone Encounter (Signed)
Pt called in stating she needed Dr. Lorin Mercy to fax a copy of her prescription over to her [place] and she also stated that when she got up to use the bathroom either 10/08/19 or 10/09/19 she heard a tearing sound and wanted a call back from [facility] to see if she could answer some follow up questions she had.   [phone]

## 2019-10-12 NOTE — Telephone Encounter (Signed)
Have not seen anything yet. I will keep an eye out for it, though.

## 2019-10-12 NOTE — Telephone Encounter (Signed)
Patient aware. She will bring form with her to work in [employer] development [employer].

## 2019-10-12 NOTE — Telephone Encounter (Signed)
I called patient and advised. She states that this has been a really bad day and she feels that she has pulled something at incision site. Work in appt made for tomorrow morning for recheck.

## 2019-10-13 ENCOUNTER — Ambulatory Visit (INDEPENDENT_AMBULATORY_CARE_PROVIDER_SITE_OTHER): Payer: BC Managed Care – PPO | Admitting: Orthopaedic Surgery

## 2019-10-13 ENCOUNTER — Encounter: Payer: Self-pay | Admitting: Orthopaedic Surgery

## 2019-10-13 ENCOUNTER — Ambulatory Visit (INDEPENDENT_AMBULATORY_CARE_PROVIDER_SITE_OTHER): Payer: BC Managed Care – PPO

## 2019-10-13 VITALS — BP 122/85 | HR 87 | Ht 64.0 in | Wt 180.0 lb

## 2019-10-13 DIAGNOSIS — M545 Low back pain, unspecified: Secondary | ICD-10-CM

## 2019-10-13 NOTE — Progress Notes (Signed)
Post-Op Visit Note   Patient: Caitlin Valdez           Date of Birth: [birth year]           MRN: [id number] Visit Date: 10/13/2019 PCP: Antony Blackbird, MD   Assessment & Plan: Patient returns early she felt a large pop when she is getting up off the toilet with a rip type sensation.  Incision is intact she has a Band-Aid on it and she has been walking using a walker at home or using a cane when she goes out.  Anterior tib gastrocsoleus is intact she is able to ambulate in the exam room.  Negative straight leg raising 90 degrees.  2 view x-rays demonstrate previous laminotomy on the right at L5 pars is intact no listhesis.  Chief Complaint:  Chief Complaint  Patient presents with  . Lower Back - Pain   Visit Diagnoses:  1. Acute midline low back pain, unspecified whether sciatica present     Plan: Patient will keep her appointment as scheduled next month.  Continue increasing her walking with a goal of getting to 2 miles per day.  Follow-Up Instructions: No follow-ups on file.   Orders:  Orders Placed This Encounter  Procedures  . XR Lumbar Spine 2-3 Views   No orders of the defined types were placed in this encounter.   Imaging: No results found.  PMFS History: Patient Active Problem List   Diagnosis Date Noted  . Protrusion of lumbar intervertebral disc 09/27/2019  . Low back pain 08/10/2019  . Radiculopathy due to lumbar intervertebral disc disorder 06/22/2019  . Anxiety and depression 03/17/2018  . Hematuria 06/27/2016  . S/P endometrial ablation 06/27/2016  . Lower abdominal pain 06/27/2016  . Migraine without status migrainosus, not intractable 06/27/2016  . Vitamin D insufficiency 02/11/2016  . De Quervain's tenosynovitis, left 02/09/2016  . Vitamin B12 deficiency 10/17/2015  . Allergic rhinitis 12/29/2014  . Current smoker 11/22/2014  . Obesity 06/29/2014  . Spinal stenosis in cervical region 02/22/2014  . Fibromyalgia 07/15/2009   Past Medical History:   Diagnosis Date  . Abdominal pain, right lower quadrant   . Anxiety   . Depression   . Fibromyalgia   . GERD (gastroesophageal reflux disease)   . Migraine   . Obesity   . Peripheral vascular disease (HCC)    RIGHT LEG  . PONV (postoperative nausea and vomiting)   . Stroke ([place])   . Trichomonas 2012    Family History  Problem Relation Age of Onset  . Diabetes Mother   . Hypertension Mother   . Breast cancer Mother 50  . Diabetes Father   . Hypertension Father   . Diabetes Other   . Hypertension Other   . Other Neg Hx     Past Surgical History:  Procedure Laterality Date  . CESAREAN SECTION    . FRACTURE SURGERY    . HYSTEROSCOPY WITH NOVASURE N/A 08/08/2014   Procedure:  NOVASURE;  Surgeon: Emily Filbert, MD;  Location: [facility];  Service: Gynecology;  Laterality: N/A;  . LUMBAR LAMINECTOMY/DECOMPRESSION MICRODISCECTOMY N/A 09/27/2019   Procedure: RIGHT LUMBAR FIVE TO SACRAL ONE MICRODISCECTOMY;  Surgeon: Marybelle Killings, MD;  Location: [facility];  Service: Orthopedics;  Laterality: N/A;  . VEIN SURGERY     Social History   Occupational History  . Not on file  Tobacco Use  . Smoking status: Current Every Day Smoker    Packs/day: 1.00    Years: 5.00  Pack years: 5.00    Types: Cigarettes  . Smokeless tobacco: Never Used  Substance and Sexual Activity  . Alcohol use: No  . Drug use: Yes    Types: Marijuana    Comment: per patient  not often, not weekly  . Sexual activity: Yes    Birth control/protection: Condom

## 2019-10-27 ENCOUNTER — Telehealth: Payer: Self-pay

## 2019-10-27 NOTE — Telephone Encounter (Signed)
Patient called Triage line   Having a lot of pain and muscle spasms for the last 3 days. Takes Gabapentin & methocarbamol. Does not help. Using walker. Would like to know what she can do or if something else can be called in. Would like CB.    Pharm: [facility].   CB: [phone]

## 2019-10-27 NOTE — Telephone Encounter (Signed)
Please advise 

## 2019-10-27 NOTE — Telephone Encounter (Signed)
I called and left message , no answer.

## 2019-10-28 NOTE — Telephone Encounter (Signed)
noted 

## 2019-11-09 ENCOUNTER — Ambulatory Visit (INDEPENDENT_AMBULATORY_CARE_PROVIDER_SITE_OTHER): Payer: BC Managed Care – PPO | Admitting: Orthopaedic Surgery

## 2019-11-09 ENCOUNTER — Encounter: Payer: Self-pay | Admitting: Orthopaedic Surgery

## 2019-11-09 ENCOUNTER — Other Ambulatory Visit: Payer: Self-pay

## 2019-11-09 ENCOUNTER — Telehealth: Payer: Self-pay

## 2019-11-09 VITALS — BP 146/87 | HR 81 | Ht 64.0 in | Wt 180.0 lb

## 2019-11-09 DIAGNOSIS — Z9889 Other specified postprocedural states: Secondary | ICD-10-CM

## 2019-11-09 MED ORDER — METHYLPREDNISOLONE 4 MG PO TABS: ORAL_TABLET | ORAL | 0 refills | Status: DC

## 2019-11-09 NOTE — Progress Notes (Signed)
49 year old black female who is about 6 weeks status post right L5-S1 microdiscectomy returns.  States that she continues to have some right-sided low back spasms and intermittent pain radiating down the right leg.  At times feels like her right leg is weak.  No complaints of bowel or bladder incontinence.  Patient is a Scientist, water quality at [employer].  She thinks it would be very difficult for her to return back to work at this point.  Exam Pleasant black female alert and oriented in no acute distress.  Gait is antalgic with a single prong cane.  Surgical incision is well-healed.  Negative logroll bilateral hips.  Negative straight leg raise.  Some tenderness over the right hip greater trochanter bursa.  Bilateral calves are nontender.  Neurovas intact.  No focal motor deficits.   Plan The patient's ongoing discomfort she was given Depo-Medrol 80 mg and Toradol 30 mg IM injections today in the clinic.  I will send in a prescription for Medrol Dosepak 6-day taper for her to start tomorrow.  Given a note keeping her out another 3 weeks and then she will discuss return to work status with Dr. Lorin Mercy at 3-week follow-up appointment.  All questions answered.

## 2019-11-09 NOTE — Telephone Encounter (Signed)
Per Benjiman Core  Give patient Depo & Toradol Inj's.  I gave patient Depo 80 mg (1cc) on left buttock and Toradol 30 mg (1cc) on her right Buttock. Patient did well.

## 2019-11-30 ENCOUNTER — Telehealth: Payer: Self-pay | Admitting: Orthopaedic Surgery

## 2019-11-30 ENCOUNTER — Other Ambulatory Visit: Payer: Self-pay

## 2019-11-30 ENCOUNTER — Ambulatory Visit (INDEPENDENT_AMBULATORY_CARE_PROVIDER_SITE_OTHER): Payer: BC Managed Care – PPO | Admitting: Orthopaedic Surgery

## 2019-11-30 ENCOUNTER — Encounter: Payer: Self-pay | Admitting: Orthopaedic Surgery

## 2019-11-30 ENCOUNTER — Ambulatory Visit (INDEPENDENT_AMBULATORY_CARE_PROVIDER_SITE_OTHER): Payer: BC Managed Care – PPO

## 2019-11-30 VITALS — Ht 64.0 in | Wt 180.0 lb

## 2019-11-30 DIAGNOSIS — Z9889 Other specified postprocedural states: Secondary | ICD-10-CM

## 2019-11-30 DIAGNOSIS — M542 Cervicalgia: Secondary | ICD-10-CM

## 2019-11-30 DIAGNOSIS — M5126 Other intervertebral disc displacement, lumbar region: Secondary | ICD-10-CM

## 2019-11-30 DIAGNOSIS — M4802 Spinal stenosis, cervical region: Secondary | ICD-10-CM

## 2019-11-30 NOTE — Progress Notes (Addendum)
Post-Op Visit Note   Patient: Caitlin Valdez           Date of Birth: Sep 01, 1970           MRN: [id number] Visit Date: 11/30/2019 PCP: Antony Blackbird, MD   Assessment & Plan:  Chief Complaint:  Chief Complaint  Patient presents with  . Lower Back - Follow-up    09/27/2019 Right L5-S1 Microdiscectomy   Visit Diagnoses:  1. Neck pain   2. Status post lumbar microdiscectomy   3. Protrusion of lumbar intervertebral disc   4. Spinal stenosis in cervical region     Plan: Patient returns she states she still in pain with recurrent back pain worse than the neck pain.  She states she has pain in her neck pain that radiates into her shoulders into the thoracic region.  Chart review shows she had an old MRI scan 2014 with some moderate spinal stenosis multifactorial from C4-C7.  Patient is ambulating with a cane using in her right hand complaining of right leg ankle weakness.  [place] Risk score narcotic 290, sedative 130 total 250.  Patient works Paediatric nurse as a Scientist, water quality she states she cannot go back to work in this much pain.  Backs giving her more problems in her neck and we will proceed with MRI scan with and without contrast to rule out recurrent disc compression.  Exam demonstrates she has some pain with straight leg raising knee and ankle jerk is intact.  She complains of pain but can walk on her heels and can walk on her toes peroneal posterior tib is strong.  Lumbar incisions well-healed.  Biceps triceps brachial radialis reflex are intact and symetrical. .  She complains of pain with forward flexion chin 2 fingerbreadths chin to chest.  Follow-Up Instructions: Work slip given no work pending return office visit after lumbar MRI scan.  Office follow-up after lumbar MRI scan with and without contrast. Orders:  Orders Placed This Encounter  Procedures  . XR Cervical Spine 2 or 3 views  . MR LUMBAR SPINE W WO CONTRAST   No orders of the defined types were placed in this  encounter.   Imaging: No results found.  PMFS History: Patient Active Problem List   Diagnosis Date Noted  . Protrusion of lumbar intervertebral disc 09/27/2019  . Low back pain 08/10/2019  . Radiculopathy due to lumbar intervertebral disc disorder 06/22/2019  . Anxiety and depression 03/17/2018  . Hematuria 06/27/2016  . S/P endometrial ablation 06/27/2016  . Lower abdominal pain 06/27/2016  . Migraine without status migrainosus, not intractable 06/27/2016  . Vitamin D insufficiency 02/11/2016  . De Quervain's tenosynovitis, left 02/09/2016  . Vitamin B12 deficiency 10/17/2015  . Allergic rhinitis 12/29/2014  . Current smoker 11/22/2014  . Obesity 06/29/2014  . Spinal stenosis in cervical region 02/22/2014  . Fibromyalgia 07/15/2009   Past Medical History:  Diagnosis Date  . Abdominal pain, right lower quadrant   . Anxiety   . Depression   . Fibromyalgia   . GERD (gastroesophageal reflux disease)   . Migraine   . Obesity   . Peripheral vascular disease (HCC)    RIGHT LEG  . PONV (postoperative nausea and vomiting)   . Stroke ([place])   . Trichomonas 2012    Family History  Problem Relation Age of Onset  . Diabetes Mother   . Hypertension Mother   . Breast cancer Mother 55  . Diabetes Father   . Hypertension Father   . Diabetes Other   .  Hypertension Other   . Other Neg Hx     Past Surgical History:  Procedure Laterality Date  . CESAREAN SECTION    . FRACTURE SURGERY    . HYSTEROSCOPY WITH NOVASURE N/A 08/08/2014   Procedure:  NOVASURE;  Surgeon: Emily Filbert, MD;  Location: [facility];  Service: Gynecology;  Laterality: N/A;  . LUMBAR LAMINECTOMY/DECOMPRESSION MICRODISCECTOMY N/A 09/27/2019   Procedure: RIGHT LUMBAR FIVE TO SACRAL ONE MICRODISCECTOMY;  Surgeon: Marybelle Killings, MD;  Location: [facility];  Service: Orthopedics;  Laterality: N/A;  . VEIN SURGERY     Social History   Occupational History  . Not on file  Tobacco Use  . Smoking status: Current Every Day  Smoker    Packs/day: 1.00    Years: 5.00    Pack years: 5.00    Types: Cigarettes  . Smokeless tobacco: Never Used  Substance and Sexual Activity  . Alcohol use: No  . Drug use: Yes    Types: Marijuana    Comment: per patient  not often, not weekly  . Sexual activity: Yes    Birth control/protection: Condom

## 2019-11-30 NOTE — Telephone Encounter (Signed)
Have you received any paperwork on patient today?

## 2019-11-30 NOTE — Telephone Encounter (Signed)
Haven't seen anything as of yet.

## 2019-11-30 NOTE — Telephone Encounter (Signed)
Patient called.   She was following upon paperwork that wax faxed over on her behalf. She needs it filled out and is also requesting a call back from [place].   Call back: [phone]

## 2019-12-03 ENCOUNTER — Telehealth: Payer: Self-pay | Admitting: Orthopaedic Surgery

## 2019-12-03 NOTE — Telephone Encounter (Signed)
I called patient with no answer. We have still not received forms to complete for disability.

## 2019-12-03 NOTE — Telephone Encounter (Signed)
I spoke with patient and advised we have not received paperwork. She is going to recheck with her company and have them resend.

## 2019-12-03 NOTE — Telephone Encounter (Signed)
I spoke with patient.

## 2019-12-03 NOTE — Telephone Encounter (Signed)
Patient called.   She was returning a call from our office but I couldn't find any clues leading to who it came from. Told her I would make her provider aware she called though.  Call back: [phone]

## 2019-12-08 ENCOUNTER — Telehealth: Payer: Self-pay | Admitting: Orthopaedic Surgery

## 2019-12-08 NOTE — Telephone Encounter (Signed)
Patient called advised she has been in the bed for the pass 3 days because she is in so much pain. Patient said her MRI is not scheduled until 01/01/2020. Patient said she is trying to keep moving but it's hard. Patient asked if she can get something for pain called into her pharmacy. Patient uses [facility] on [address]. The number to contact patient is [phone]

## 2019-12-08 NOTE — Telephone Encounter (Signed)
Please advise 

## 2019-12-09 ENCOUNTER — Other Ambulatory Visit: Payer: Self-pay | Admitting: Orthopaedic Surgery

## 2019-12-09 MED ORDER — HYDROCODONE-ACETAMINOPHEN 5-325 MG PO TABS: 1.0000 | ORAL_TABLET | Freq: Four times a day (QID) | ORAL | 0 refills | Status: DC | PRN

## 2019-12-09 NOTE — Telephone Encounter (Signed)
Dr. Lorin Mercy spoke with patient and sent in #[phone] [place].

## 2019-12-09 NOTE — Telephone Encounter (Signed)
I called no answer

## 2020-01-01 ENCOUNTER — Ambulatory Visit
Admission: RE | Admit: 2020-01-01 | Discharge: 2020-01-01 | Disposition: A | Discharge status: Home / Self Care | Payer: BC Managed Care – PPO | Source: Ambulatory Visit | Attending: Orthopaedic Surgery | Admitting: Orthopaedic Surgery

## 2020-01-01 ENCOUNTER — Other Ambulatory Visit: Payer: Self-pay

## 2020-01-01 ENCOUNTER — Other Ambulatory Visit: Payer: BC Managed Care – PPO

## 2020-01-01 DIAGNOSIS — Z9889 Other specified postprocedural states: Secondary | ICD-10-CM

## 2020-01-01 MED ORDER — GADOBENATE DIMEGLUMINE 529 MG/ML IV SOLN
15.0000 mL | Freq: Once | INTRAVENOUS | Status: AC | PRN
  Administered 2020-01-01: 17 mL via INTRAVENOUS

## 2020-01-04 ENCOUNTER — Ambulatory Visit (INDEPENDENT_AMBULATORY_CARE_PROVIDER_SITE_OTHER): Payer: BC Managed Care – PPO | Admitting: Orthopaedic Surgery

## 2020-01-04 ENCOUNTER — Encounter: Payer: Self-pay | Admitting: Orthopaedic Surgery

## 2020-01-04 ENCOUNTER — Telehealth: Payer: Self-pay | Admitting: Orthopaedic Surgery

## 2020-01-04 VITALS — BP 127/85 | HR 86 | Ht 64.0 in | Wt 180.0 lb

## 2020-01-04 DIAGNOSIS — M4802 Spinal stenosis, cervical region: Secondary | ICD-10-CM

## 2020-01-04 DIAGNOSIS — Z9889 Other specified postprocedural states: Secondary | ICD-10-CM | POA: Diagnosis not present

## 2020-01-04 NOTE — Progress Notes (Signed)
Office Visit Note   Patient: Caitlin Valdez           Date of Birth: [birth year]           MRN: [id number] Visit Date: 01/04/2020              Requested by: Antony Blackbird, MD [place],  [place] [id number] PCP: Antony Blackbird, MD   Assessment & Plan: Visit Diagnoses:  1. Spinal stenosis in cervical region   2. S/P lumbar microdiscectomy     Plan: Work slip given no work x1 month.  We reviewed previous MRI scan 2017 that showed moderate stenosis at C5-6 C6-7.  Patient had severe bilateral neuroforaminal stenosis bilaterally at C7.  Patient will need new MRI scan for comparison and likely cause of her persistent neck arm pain total body pain back pain may be related to progressive cervical stenosis.  Currently patient has no myelopathic changes on physical exam.  Lumbar MRI scan discussed and gave her a copy of the report and we reviewed the images as well as previous cervical MRI from 2017.  Patient is anxious to get back to work and supports herself.  We discussed cervical decompression cervical fusion today.  Follow-Up Instructions: No follow-ups on file.   Orders:  Orders Placed This Encounter  Procedures   MR Cervical Spine w/o contrast   No orders of the defined types were placed in this encounter.     Procedures: No procedures performed   Clinical Data: No additional findings.   Subjective: Chief Complaint  Patient presents with   Lower Back - Pain, Follow-up    MRI Lumbar Review    HPI 49 year old female returns post right L5-S1 microdiscectomy 09/27/2019 with recent MRI scan ordered due to persistent back pain leg pain and basically complains of pain everywhere showing no evidence of disc recurrence.  Good decompression and normal amount of postop scar tissue visualized.  Patient states she has had weakness in her hands and states "there is no way I can work like this in the amount of pain and in".  Review of Systems all systems update.  Patient  current smoker past history of cervical stenosis MRI 2017.   Objective: Vital Signs: BP 127/85    Pulse 86    Ht 5\' 4"  (1.626 m)    Wt 180 lb (81.6 kg)    BMI 30.90 kg/m   Physical Exam Constitutional:      Appearance: She is well-developed.  HENT:     Head: Normocephalic.     Right Ear: External ear normal.     Left Ear: External ear normal.  Eyes:     Pupils: Pupils are equal, round, and reactive to light.  Neck:     Thyroid: No thyromegaly.     Trachea: No tracheal deviation.  Cardiovascular:     Rate and Rhythm: Normal rate.  Pulmonary:     Effort: Pulmonary effort is normal.  Abdominal:     Palpations: Abdomen is soft.  Skin:    General: Skin is warm and dry.  Neurological:     Mental Status: She is alert and oriented to person, place, and time.  Psychiatric:        Behavior: Behavior normal.     Ortho Exam patient has brachial plexus tenderness more on the right than left increased pain with cervical compression.  Increased pain with forward flexion.  Knee and ankle jerk are intact anterior tib gastrocsoleus is strong no peroneal  weakness.  Lumbar incisions well-healed.  Specialty Comments:  No specialty comments available.  Imaging: No results found.   PMFS History: Patient Active Problem List   Diagnosis Date Noted   S/P lumbar microdiscectomy 01/04/2020   Low back pain 08/10/2019   Radiculopathy due to lumbar intervertebral disc disorder 06/22/2019   Anxiety and depression 03/17/2018   Hematuria 06/27/2016   S/P endometrial ablation 06/27/2016   Lower abdominal pain 06/27/2016   Migraine without status migrainosus, not intractable 06/27/2016   Vitamin D insufficiency 02/11/2016   De Quervain's tenosynovitis, left 02/09/2016   Vitamin B12 deficiency 10/17/2015   Allergic rhinitis 12/29/2014   Current smoker 11/22/2014   Obesity 06/29/2014   Spinal stenosis in cervical region 02/22/2014   Fibromyalgia 07/15/2009   Past Medical  History:  Diagnosis Date   Abdominal pain, right lower quadrant    Anxiety    Depression    Fibromyalgia    GERD (gastroesophageal reflux disease)    Migraine    Obesity    Peripheral vascular disease (HCC)    RIGHT LEG   PONV (postoperative nausea and vomiting)    Stroke ([facility] [place])    Trichomonas 2012    Family History  Problem Relation Age of Onset   Diabetes Mother    Hypertension Mother    Breast cancer Mother 47   Diabetes Father    Hypertension Father    Diabetes Other    Hypertension Other    Other Neg Hx     Past Surgical History:  Procedure Laterality Date   CESAREAN SECTION     FRACTURE SURGERY     HYSTEROSCOPY WITH NOVASURE N/A 08/08/2014   Procedure:  Rebbeca Paul;  Surgeon: Emily Filbert, MD;  Location: [facility];  Service: Gynecology;  Laterality: N/A;   LUMBAR LAMINECTOMY/DECOMPRESSION MICRODISCECTOMY N/A 09/27/2019   Procedure: RIGHT LUMBAR FIVE TO SACRAL ONE MICRODISCECTOMY;  Surgeon: Marybelle Killings, MD;  Location: [facility];  Service: Orthopedics;  Laterality: N/A;   VEIN SURGERY     Social History   Occupational History   Not on file  Tobacco Use   Smoking status: Current Every Day Smoker    Packs/day: 1.00    Years: 5.00    Pack years: 5.00    Types: Cigarettes   Smokeless tobacco: Never Used  Vaping Use   Vaping Use: Never used  Substance and Sexual Activity   Alcohol use: No   Drug use: Yes    Types: Marijuana    Comment: per patient  not often, not weekly   Sexual activity: Yes    Birth control/protection: Condom

## 2020-01-04 NOTE — Telephone Encounter (Signed)
Patient called requesting that her OTW note be faxed to her employer at [phone].  CB#[phone].  Thank you.

## 2020-01-05 NOTE — Telephone Encounter (Signed)
Note faxed.

## 2020-01-21 ENCOUNTER — Telehealth: Payer: Self-pay | Admitting: Orthopaedic Surgery

## 2020-01-21 NOTE — Telephone Encounter (Signed)
Please advise 

## 2020-01-21 NOTE — Telephone Encounter (Signed)
Pt called asking if something can be called in for her pain?   [phone]

## 2020-01-24 NOTE — Telephone Encounter (Signed)
FYI-I called and reviewed.  She has MRI scan scheduled Thursday recommend waiting till after surgery for pain medication so will be more effective.  Patient states that she feels that she is someone who would not get addicted to narcotics no matter what.  [place] website narcotic #280 score. Recommend avoiding narcotics pre-op so they will work well post op.

## 2020-01-27 ENCOUNTER — Ambulatory Visit
Admission: RE | Admit: 2020-01-27 | Discharge: 2020-01-27 | Disposition: A | Discharge status: Home / Self Care | Payer: BC Managed Care – PPO | Source: Ambulatory Visit | Attending: Orthopaedic Surgery | Admitting: Orthopaedic Surgery

## 2020-01-27 DIAGNOSIS — M4802 Spinal stenosis, cervical region: Secondary | ICD-10-CM

## 2020-01-31 ENCOUNTER — Other Ambulatory Visit: Payer: Self-pay | Admitting: *Deleted

## 2020-01-31 ENCOUNTER — Ambulatory Visit: Payer: BC Managed Care – PPO | Attending: Internal Medicine

## 2020-01-31 DIAGNOSIS — Z20822 Contact with and (suspected) exposure to covid-19: Secondary | ICD-10-CM

## 2020-02-01 ENCOUNTER — Encounter: Payer: Self-pay | Admitting: Orthopaedic Surgery

## 2020-02-01 ENCOUNTER — Other Ambulatory Visit: Payer: Self-pay

## 2020-02-01 ENCOUNTER — Ambulatory Visit (INDEPENDENT_AMBULATORY_CARE_PROVIDER_SITE_OTHER): Payer: BC Managed Care – PPO | Admitting: Orthopaedic Surgery

## 2020-02-01 ENCOUNTER — Ambulatory Visit: Payer: BC Managed Care – PPO | Attending: Internal Medicine | Admitting: Critical Care Medicine

## 2020-02-01 ENCOUNTER — Encounter: Payer: Self-pay | Admitting: Critical Care Medicine

## 2020-02-01 VITALS — BP 118/83 | HR 81 | Temp 97.6°F | Resp 16 | Ht 63.0 in | Wt 187.0 lb

## 2020-02-01 VITALS — Ht 64.0 in | Wt 180.0 lb

## 2020-02-01 DIAGNOSIS — K219 Gastro-esophageal reflux disease without esophagitis: Secondary | ICD-10-CM

## 2020-02-01 DIAGNOSIS — E538 Deficiency of other specified B group vitamins: Secondary | ICD-10-CM | POA: Diagnosis not present

## 2020-02-01 DIAGNOSIS — M47812 Spondylosis without myelopathy or radiculopathy, cervical region: Secondary | ICD-10-CM

## 2020-02-01 DIAGNOSIS — F17200 Nicotine dependence, unspecified, uncomplicated: Secondary | ICD-10-CM | POA: Diagnosis not present

## 2020-02-01 DIAGNOSIS — E559 Vitamin D deficiency, unspecified: Secondary | ICD-10-CM

## 2020-02-01 DIAGNOSIS — F419 Anxiety disorder, unspecified: Secondary | ICD-10-CM

## 2020-02-01 DIAGNOSIS — F329 Major depressive disorder, single episode, unspecified: Secondary | ICD-10-CM

## 2020-02-01 DIAGNOSIS — M4802 Spinal stenosis, cervical region: Secondary | ICD-10-CM | POA: Diagnosis not present

## 2020-02-01 DIAGNOSIS — F32A Depression, unspecified: Secondary | ICD-10-CM

## 2020-02-01 DIAGNOSIS — Z791 Long term (current) use of non-steroidal anti-inflammatories (NSAID): Secondary | ICD-10-CM

## 2020-02-01 LAB — NOVEL CORONAVIRUS, NAA: SARS-CoV-2, NAA: NOT DETECTED

## 2020-02-01 LAB — SARS-COV-2, NAA 2 DAY TAT

## 2020-02-01 MED ORDER — OXYCODONE-ACETAMINOPHEN 5-325 MG PO TABS: 1.0000 | ORAL_TABLET | ORAL | 0 refills | Status: DC | PRN

## 2020-02-01 MED ORDER — VITAMIN B-12 500 MCG PO TABS: 500.0000 ug | ORAL_TABLET | Freq: Every day | ORAL | 3 refills | Status: DC

## 2020-02-01 MED ORDER — DULOXETINE HCL 40 MG PO CPEP: 40.0000 mg | ORAL_CAPSULE | Freq: Every day | ORAL | 6 refills | Status: DC

## 2020-02-01 MED ORDER — VITAMIN D (ERGOCALCIFEROL) 1.25 MG (50000 UNIT) PO CAPS: 50000.0000 [IU] | ORAL_CAPSULE | ORAL | 1 refills | Status: DC

## 2020-02-01 MED ORDER — GABAPENTIN 300 MG PO CAPS: 600.0000 mg | ORAL_CAPSULE | Freq: Three times a day (TID) | ORAL | 1 refills | Status: DC

## 2020-02-01 MED ORDER — DULOXETINE HCL 60 MG PO CPEP: 60.0000 mg | ORAL_CAPSULE | Freq: Every day | ORAL | 3 refills | Status: DC

## 2020-02-01 MED ORDER — OMEPRAZOLE 40 MG PO CPDR: 40.0000 mg | DELAYED_RELEASE_CAPSULE | Freq: Every day | ORAL | 3 refills | Status: DC

## 2020-02-01 MED ORDER — METHOCARBAMOL 500 MG PO TABS: 500.0000 mg | ORAL_TABLET | Freq: Four times a day (QID) | ORAL | 0 refills | Status: DC | PRN

## 2020-02-01 NOTE — Assessment & Plan Note (Signed)
History of vitamin D insufficiency we will recheck levels and begin vitamin D 50,000 units weekly prescription sent

## 2020-02-01 NOTE — Assessment & Plan Note (Signed)
Active smoker I gave the patient 8 minutes of smoking cessation counseling and asked her to quit smoking for 2 weeks prior to his planned surgery

## 2020-02-01 NOTE — Progress Notes (Signed)
Office Visit Note   Patient: Caitlin Valdez           Date of Birth: Mar 21, 1971           MRN: [id number] Visit Date: 02/01/2020              Requested by: Antony Blackbird, MD [place],  [place] [id number] PCP: Antony Blackbird, MD   Assessment & Plan: Visit Diagnoses:  1. Spinal stenosis in cervical region   2. Spondylosis without myelopathy or radiculopathy, cervical region     Plan: Patient needs to restart her duloxetine prescription given with refills.  She will have to get this refilled elsewhere after she runs out.  We reviewed the MRI scan which shows progression of her spondylosis and stenosis at C5-6 C6-7 with narrowing of the AP diameter the canal down to 8 to 9 mm.  She has moderate cervical stenosis and states she cannot work with her current level of pain. We discussed 2 level cervical fusion at C5-6, C6-7 with allograft and plate. Risks discussed , post op collar times 6 wks. . She understands and agrees to proceed.  Work note given that patient is being scheduled for surgical surgery.  No work times approximately 8 weeks.  Follow-Up Instructions: No follow-ups on file.   Orders:  No orders of the defined types were placed in this encounter.  Meds ordered this encounter  Medications  . DULoxetine HCl 40 MG CPEP    Sig: Take 40 mg by mouth daily.    Dispense:  30 capsule    Refill:  6      Procedures: No procedures performed   Clinical Data: No additional findings.   Subjective: Chief Complaint  Patient presents with  . Neck - Pain, Follow-up    MRI Cervical Spine Review    HPI 49 year old female returns with ongoing problems with neck pain that radiates into her shoulders with pain with rotation of her neck.  She continues to have some problems with her lower back still using a cane for ambulation has not worked in almost 5 months.  She does have problems with anxiety depression but has not been on her antidepressant medication in several  months.  Cervical MRI scan has been performed and is available for review.  Patient states her pain continues to be severe.  She rates that pain is greater than 10 out of 10.  Review of Systems past smoker last MRI which showed some cervical stenosis was 2017.   Objective: Vital Signs: Ht 5\' 4"  (1.626 m)   Wt 180 lb (81.6 kg)   BMI 30.90 kg/m   Physical Exam Constitutional:      Appearance: She is well-developed.  HENT:     Head: Normocephalic.     Right Ear: External ear normal.     Left Ear: External ear normal.  Eyes:     Pupils: Pupils are equal, round, and reactive to light.  Neck:     Thyroid: No thyromegaly.     Trachea: No tracheal deviation.  Cardiovascular:     Rate and Rhythm: Normal rate.  Pulmonary:     Effort: Pulmonary effort is normal.  Abdominal:     Palpations: Abdomen is soft.  Skin:    General: Skin is warm and dry.  Neurological:     Mental Status: She is alert and oriented to person, place, and time.  Psychiatric:        Behavior: Behavior normal.  Ortho Exam patient has brachial plexus tenderness more on the right than left increased pain with cervical compression some relief with distraction increased pain with forward flexion.  Reflexes are 2+ and symmetrical.  Ulnar nerve at the elbow median nerve at the wrist is normal to exam.  No interossei weakness.  Biceps triceps is strong.  Specialty Comments:  No specialty comments available.  Imaging:  CLINICAL DATA:  Initial evaluation for spondylosis at C5-6 and C6-7, progressive cervical stenosis.  EXAM: MRI CERVICAL SPINE WITHOUT CONTRAST  TECHNIQUE: Multiplanar, multisequence MR imaging of the cervical spine was performed. No intravenous contrast was administered.  COMPARISON:  Prior MRI from 06/30/2013.  FINDINGS: Alignment: Straightening with mild reversal of the normal cervical lordosis. No listhesis.  Vertebrae: Vertebral body height maintained without evidence for acute  or chronic fracture. Bone marrow signal intensity within normal limits. No discrete or worrisome osseous lesions. Discogenic reactive endplate changes present about the C5-6 and C6-7 interspace, progressed from previous. No other abnormal marrow edema.  Cord: Signal intensity within the cervical spinal cord is normal.  Posterior Fossa, vertebral arteries, paraspinal tissues: Visualized brain and posterior fossa within normal limits. Craniocervical junction normal. Paraspinous and prevertebral soft tissues within normal limits. Normal intravascular flow voids seen within the vertebral arteries bilaterally.  Disc levels:  C2-C3: No significant disc bulge.  No canal or foraminal stenosis.  C3-C4: Mild annular disc bulge, slightly eccentric to the left. No significant spinal stenosis. Superimposed left greater than right uncovertebral hypertrophy with resultant mild left C4 foraminal stenosis. No significant right foraminal narrowing. Appearance is mildly progressed from previous.  C4-C5: Diffuse disc bulge with bilateral uncovertebral hypertrophy. Flattening and partial effacement of the ventral thecal sac with resultant mild spinal stenosis. Minimal flattening of the ventral spinal cord. Foramina remain patent. Appearance is similar to previous.  C5-C6: Diffuse disc bulge with bilateral uncovertebral hypertrophy. Superimposed central to left subarticular disc protrusion with slight inferior migration (series 6, image 16). Disc material flattens and partially faces the ventral thecal sac and results in mild to moderate spinal stenosis. Flattening of the ventral and left hemicord, slightly progressed from previous. Moderate bilateral C6 foraminal narrowing.  C6-C7: Chronic diffuse degenerative disc osteophyte with intervertebral disc space narrowing. Broad posterior component flattens and partially faces the ventral thecal sac resultant mild-to-moderate spinal stenosis.  Minimal cord flattening. Moderate to severe left with moderate right C7 foraminal stenosis, progressed from previous.  C7-T1: Minimal annular disc bulge. Mild facet hypertrophy. No canal or foraminal stenosis.  Visualized upper thoracic spine demonstrates no significant finding.  IMPRESSION: 1. Progressive degenerative spondylosis at C5-6 and C6-7 with resultant mild to moderate spinal stenosis. Moderate bilateral C6 foraminal stenosis, with moderate to severe left greater than right C7 foraminal narrowing. 2. Mild disc bulging with uncovertebral hypertrophy at C3-4 with resultant mild left C4 foraminal stenosis, mildly progressed from previous. 3. Disc bulge at C4-5 with resultant mild spinal stenosis, stable.   Electronically Signed   By: Jeannine Boga M.D.   On: 01/28/2020 04:17      PMFS History: Patient Active Problem List   Diagnosis Date Noted  . Spondylosis without myelopathy or radiculopathy, cervical region 02/01/2020  . S/P lumbar microdiscectomy 01/04/2020  . Low back pain 08/10/2019  . Radiculopathy due to lumbar intervertebral disc disorder 06/22/2019  . Anxiety and depression 03/17/2018  . Hematuria 06/27/2016  . S/P endometrial ablation 06/27/2016  . Lower abdominal pain 06/27/2016  . Migraine without status migrainosus, not intractable 06/27/2016  . Vitamin  D insufficiency 02/11/2016  . De Quervain's tenosynovitis, left 02/09/2016  . Vitamin B12 deficiency 10/17/2015  . Allergic rhinitis 12/29/2014  . Current smoker 11/22/2014  . Obesity 06/29/2014  . Spinal stenosis in cervical region 02/22/2014  . Fibromyalgia 07/15/2009   Past Medical History:  Diagnosis Date  . Abdominal pain, right lower quadrant   . Anxiety   . Depression   . Fibromyalgia   . GERD (gastroesophageal reflux disease)   . Migraine   . Obesity   . Peripheral vascular disease (HCC)    RIGHT LEG  . PONV (postoperative nausea and vomiting)   . Stroke ([facility])   .  Trichomonas 2012    Family History  Problem Relation Age of Onset  . Diabetes Mother   . Hypertension Mother   . Breast cancer Mother 1  . Diabetes Father   . Hypertension Father   . Diabetes Other   . Hypertension Other   . Other Neg Hx     Past Surgical History:  Procedure Laterality Date  . CESAREAN SECTION    . FRACTURE SURGERY    . HYSTEROSCOPY WITH NOVASURE N/A 08/08/2014   Procedure:  NOVASURE;  Surgeon: Emily Filbert, MD;  Location: [facility];  Service: Gynecology;  Laterality: N/A;  . LUMBAR LAMINECTOMY/DECOMPRESSION MICRODISCECTOMY N/A 09/27/2019   Procedure: RIGHT LUMBAR FIVE TO SACRAL ONE MICRODISCECTOMY;  Surgeon: Marybelle Killings, MD;  Location: [facility];  Service: Orthopedics;  Laterality: N/A;  . VEIN SURGERY     Social History   Occupational History  . Not on file  Tobacco Use  . Smoking status: Current Every Day Smoker    Packs/day: 1.00    Years: 5.00    Pack years: 5.00    Types: Cigarettes  . Smokeless tobacco: Never Used  Vaping Use  . Vaping Use: Never used  Substance and Sexual Activity  . Alcohol use: No  . Drug use: Yes    Types: Marijuana    Comment: per patient  not often, not weekly  . Sexual activity: Yes    Birth control/protection: Condom

## 2020-02-01 NOTE — Assessment & Plan Note (Signed)
Spondylosis with severe spinal stenosis of the cervical region and need for a multilevel laminectomy and fusion planned for August of this year  The patient can be cleared medically and from a cardiac standpoint for planned surgery  I I plan to refill the gabapentin and increase dose to 600 mg 3 times daily and refill Robaxin 500 mg 3 times daily as needed for muscle spasm as well will give a short-term supply of Percocet 5/325 1 every 4 hours as needed for severe pain  I checked the [place] controlled substance database and did not find multiple prescribers that were recently given prescriptions

## 2020-02-01 NOTE — Assessment & Plan Note (Signed)
Significant anxiety and depression will refill duloxetine and increase dose to 60 mg daily and have this patient see the  clinical social worker for counseling

## 2020-02-01 NOTE — Progress Notes (Addendum)
Subjective:    Patient ID: Caitlin Valdez, female    DOB: Oct 06, 1970, 49 y.o.   MRN: [id number]  49 y.o.F PCP is F[facility].   Needs medical clearance for Cervical surgery C 5-6 and C 6-7 laminectomy/fusion per Dr Lorin [facility] of fibromyalgia, migraine.  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  The patient's process has progressed to that of severe pain.  The patient has decreased functionality with this.  The patient is here today for medical and cardiac clearance for planned cervical spine surgery.  Patient does have reflux and takes proton pump inhibitors.  Patient had previous diagnosis of vitamin B 12 and vitamin D deficiency and this needs to be evaluated again as well.  The patient is out of her pain medicine out of gabapentin at this time.  The patient has quite a bit of depression related to recent loss of family members the last 15 months  There is no history of diabetes or hypertension      Past Medical History:  Diagnosis Date  . Abdominal pain, right lower quadrant   . Anxiety   . Depression   . Fibromyalgia   . GERD (gastroesophageal reflux disease)   . Migraine   . Obesity   . Peripheral vascular disease (HCC)    RIGHT LEG  . PONV (postoperative nausea and vomiting)   . Stroke ([place])   . Trichomonas 2012     Family History  Problem Relation Age of Onset  . Diabetes Mother   . Hypertension Mother   . Breast cancer Mother 64  . Diabetes Father   . Hypertension Father   . Diabetes Other   . Hypertension Other   . Other Neg Hx      Social History   Socioeconomic History  . Marital status: Single    Spouse name: Not on file  . Number of children: Not on file  . Years of  education: Not on file  . Highest education level: Not on file  Occupational History  . Not on file  Tobacco Use  . Smoking status: Current Every Day Smoker    Packs/day: 1.00    Years: 5.00    Pack years: 5.00    Types: Cigarettes  . Smokeless tobacco: Never Used  Vaping Use  . Vaping Use: Never used  Substance and Sexual Activity  . Alcohol use: No  . Drug use: Yes    Types: Marijuana    Comment: per patient  not often, not weekly  . Sexual activity: Yes    Birth control/protection: Condom  Other Topics Concern  . Not on file  Social History Narrative  . Not on file   Social Determinants of Health   Financial Resource Strain:   . Difficulty of Paying Living Expenses:   Food Insecurity:   . Worried About Charity fundraiser in the Last Year:   . Arboriculturist in the Last Year:   Transportation Needs:   . Film/video editor (Medical):   [name] Lack of Transportation (Non-Medical):   Physical Activity:   . Days of Exercise per Week:   . Minutes of Exercise per Session:   Stress:   . Feeling  of Stress :   Social Connections:   . Frequency of Communication with Friends and Family:   . Frequency of Social Gatherings with Friends and Family:   . Attends Religious Services:   . Active Member of Clubs or Organizations:   . Attends Archivist Meetings:   [name] Marital Status:   Intimate Partner Violence:   . Fear of Current or Ex-Partner:   . Emotionally Abused:   [name] Physically Abused:   . Sexually Abused:      No Known Allergies   Outpatient Medications Prior to Visit  Medication Sig Dispense Refill  . cetirizine (ZYRTEC) 10 MG tablet Take 1 tablet (10 mg total) by mouth at bedtime. To help with congestion (Patient taking differently: Take 10 mg by mouth daily as needed for allergies. ) 30 tablet 1  . polyethylene glycol powder (MIRALAX) 17 GM/SCOOP powder Mix 17 gm with 8 or more ounces of fluid and drink daily to help with constipation; 1 month supply 255 g  prn  . DULoxetine HCl 40 MG CPEP Take 40 mg by mouth daily. 30 capsule 6  . Misc. Devices MISC Please provide patient with insurance approved custom compression socks for PVD. 1 each 0  . gabapentin (NEURONTIN) 300 MG capsule Take 300 mg by mouth 2 (two) times daily.  (Patient not taking: Reported on 02/01/2020)    . guaiFENesin-dextromethorphan (ROBITUSSIN DM) 100-10 MG/5ML syrup Take 5 mLs by mouth every 4 (four) hours as needed for cough. (Patient not taking: Reported on 02/01/2020) 118 mL 0  . HYDROcodone-acetaminophen (NORCO/VICODIN) 5-325 MG tablet Take 1-2 tablets by mouth every 6 (six) hours as needed for moderate pain. Post op pain (Patient not taking: Reported on 02/01/2020) 20 tablet 0  . methocarbamol (ROBAXIN) 500 MG tablet Take 1 tablet (500 mg total) by mouth every 6 (six) hours as needed for muscle spasms. (Patient not taking: Reported on 02/01/2020) 60 tablet 0  . methylPREDNISolone (MEDROL) 4 MG tablet 6-day taper to be taken as directed (Patient not taking: Reported on 11/30/2019) 21 tablet 0  . omeprazole (PRILOSEC) 40 MG capsule Take 1 capsule (40 mg total) by mouth daily. (Patient not taking: Reported on 02/01/2020) 30 capsule 3  . oxyCODONE-acetaminophen (PERCOCET/ROXICET) 5-325 MG tablet Take 1-2 tablets by mouth every 4 (four) hours as needed for severe pain. (Patient not taking: Reported on 10/13/2019) 50 tablet 0   No facility-administered medications prior to visit.       Review of Systems     Objective:   Physical Exam Vitals:   02/01/20 1525  BP: 118/83  Pulse: 81  Resp: 16  Temp: 97.6 F (36.4 C)  SpO2: 100%  Weight: 187 lb (84.8 kg)  Height: 5\' 3"  (1.6 m)    Gen: Pleasant, well-nourished, in no distress,  depressed affect  ENT: No lesions,  mouth clear,  oropharynx clear, no postnasal drip  Neck: No JVD, no TMG, no carotid bruits  Lungs: No use of accessory muscles, no dullness to percussion, clear without rales or rhonchi  Cardiovascular: RRR, heart  sounds normal, no murmur or gallops, no peripheral edema  Abdomen: soft mild epigastric tenderness no rebound or guarding no HSM,  BS normal  Musculoskeletal: No deformities, no cyanosis or clubbing  Neuro: alert, non focal  Skin: Warm, no lesions or rashes  eCG 09/2019 Normal sinus rhythm        Assessment & Plan:  I personally reviewed all images and lab data in the [facility] system as well  as any outside material available during this office visit and agree with the  radiology impressions.   Anxiety and depression Significant anxiety and depression will refill duloxetine and increase dose to 60 mg daily and have this patient see the  clinical social worker for counseling  Spondylosis without myelopathy or radiculopathy, cervical region Spondylosis with severe spinal stenosis of the cervical region and need for a multilevel laminectomy and fusion planned for August of this year  The patient can be cleared medically and from a cardiac standpoint for planned surgery  I I plan to refill the gabapentin and increase dose to 600 mg 3 times daily and refill Robaxin 500 mg 3 times daily as needed for muscle spasm as well will give a short-term supply of Percocet 5/325 1 every 4 hours as needed for severe pain  I checked the [place] controlled substance database and did not find multiple prescribers that were recently given prescriptions    Vitamin B12 deficiency History of vitamin B12 deficiency we will recheck levels and asked the patient to begin vitamin B-12 500 mcg daily  Vitamin D insufficiency History of vitamin D insufficiency we will recheck levels and begin vitamin D 50,000 units weekly prescription sent  Spinal stenosis in cervical region As per prior note regarding cervical disc disease  Gastroesophageal reflux disease without esophagitis History of long-term use of nonsteroidals well refill omeprazole 40 mg daily  Current smoker Active smoker I gave the patient 8  minutes of smoking cessation counseling and asked her to quit smoking for 2 weeks prior to his planned surgery   Diagnoses and all orders for this visit:  Spinal stenosis in cervical region -     Comprehensive metabolic panel -     CBC with Differential/Platelet  Vitamin B12 deficiency -     Comprehensive metabolic panel -     CBC with Differential/Platelet -     Vitamin B12  Current smoker  Vitamin D insufficiency -     Comprehensive metabolic panel -     VITAMIN D 25 Hydroxy (Vit-D Deficiency, Fractures)  Anxiety and depression  Gastroesophageal reflux disease without esophagitis -     Comprehensive metabolic panel -     omeprazole (PRILOSEC) 40 MG capsule; Take 1 capsule (40 mg total) by mouth daily.  NSAID long-term use -     CBC with Differential/Platelet -     omeprazole (PRILOSEC) 40 MG capsule; Take 1 capsule (40 mg total) by mouth daily.  Spondylosis without myelopathy or radiculopathy, cervical region  Other orders -     oxyCODONE-acetaminophen (PERCOCET/ROXICET) 5-325 MG tablet; Take 1-2 tablets by mouth every 4 (four) hours as needed for severe pain. -     gabapentin (NEURONTIN) 300 MG capsule; Take 2 capsules (600 mg total) by mouth 3 (three) times daily. -     methocarbamol (ROBAXIN) 500 MG tablet; Take 1 tablet (500 mg total) by mouth every 6 (six) hours as needed for muscle spasms. -     Vitamin D, Ergocalciferol, (DRISDOL) 1.25 MG (50000 UNIT) CAPS capsule; Take 1 capsule (50,000 Units total) by mouth every 7 (seven) days. -     vitamin B-12 (CYANOCOBALAMIN) 500 MCG tablet; Take 1 tablet (500 mcg total) by mouth daily. -     DULoxetine (CYMBALTA) 60 MG capsule; Take 1 capsule (60 mg total) by mouth daily.   Will check metabolic panel and blood count as well

## 2020-02-01 NOTE — Assessment & Plan Note (Signed)
As per prior note regarding cervical disc disease

## 2020-02-01 NOTE — Progress Notes (Signed)
Here for med clearance  DOS August 4/ MD [place]  Needs meds refills

## 2020-02-01 NOTE — Patient Instructions (Signed)
Begin gabapentin 600 mg 3 times a day The prescription for oxycodone acetaminophen was given to take as needed for pain  Resume omeprazole 40 mg daily before breakfast for stomach acid  Resume Robaxin as needed for muscle spasm  Vitamin D and vitamin B12 prescription was sent for vitamin D take vitamin D weekly  Labs today include vitamin D and A45 levels metabolic panel complete blood counts  An appointment with Christa See our clinical social worker will be made for your anxiety and depression that is somewhat reactive with all the recent loss and chronic pain  The duloxetine has antidepressant antianxiety properties and I will increase this dose to 60 mg daily and sent to your pharmacy  Return to see Dr. Joya Gaskins in 2 months  You are cleared for your planned surgery in August I will send Dr. Lorin [facility]cy the clearance and a copy of this note  Please focus on smoking cessation prior to the surgery this will allow you to have a less complicated postoperative course   Smoking Tobacco Information, Adult Smoking tobacco can be harmful to your health. Tobacco contains a poisonous (toxic), colorless chemical called nicotine. Nicotine is addictive. It changes the brain and can make it hard to stop smoking. Tobacco also has other toxic chemicals that can hurt your body and raise your risk of many cancers. How can smoking tobacco affect me? Smoking tobacco puts you at risk for:  Cancer. Smoking is most commonly associated with lung cancer, but can also lead to cancer in other parts of the body.  Chronic obstructive pulmonary disease (COPD). This is a long-term lung condition that makes it hard to breathe. It also gets worse over time.  High blood pressure (hypertension), heart disease, stroke, or heart attack.  Lung infections, such as pneumonia.  Cataracts. This is when the lenses in the eyes become clouded.  Digestive problems. This may include peptic ulcers, heartburn, and gastroesophageal  reflux disease (GERD).  Oral health problems, such as gum disease and tooth loss.  Loss of taste and smell. Smoking can affect your appearance by causing:  Wrinkles.  Yellow or stained teeth, fingers, and fingernails. Smoking tobacco can also affect your social life, because:  It may be challenging to find places to smoke when away from home. Many workplaces, [employer], hotels, and public places are tobacco-free.  Smoking is expensive. This is due to the cost of tobacco and the long-term costs of treating health problems from smoking.  Secondhand smoke may affect those around you. Secondhand smoke can cause lung cancer, breathing problems, and heart disease. Children of smokers have a higher risk for: ? Sudden infant death syndrome (SIDS). ? Ear infections. ? Lung infections. If you currently smoke tobacco, quitting now can help you:  Lead a longer and healthier life.  Look, smell, breathe, and feel better over time.  Save money.  Protect others from the harms of secondhand smoke. What actions can I take to prevent health problems? Quit smoking   Do not start smoking. Quit if you already do.  Make a plan to quit smoking and commit to it. Look for programs to help you and ask your health care provider for recommendations and ideas.  Set a date and write down all the reasons you want to quit.  Let your friends and family know you are quitting so they can help and support you. Consider finding friends who also want to quit. It can be easier to quit with someone else, so that you can  support each other.  Talk with your health care provider about using nicotine replacement medicines to help you quit, such as gum, lozenges, patches, sprays, or pills.  Do not replace cigarette smoking with electronic cigarettes, which are commonly called e-cigarettes. The safety of e-cigarettes is not known, and some may contain harmful chemicals.  If you try to quit but return to smoking,  stay positive. It is common to slip up when you first quit, so take it one day at a time.  Be prepared for cravings. When you feel the urge to smoke, chew gum or suck on hard candy. Lifestyle  Stay busy and take care of your body.  Drink enough fluid to keep your urine pale yellow.  Get plenty of exercise and eat a healthy diet. This can help prevent weight gain after quitting.  Monitor your eating habits. Quitting smoking can cause you to have a larger appetite than when you smoke.  Find ways to relax. Go out with friends or family to a movie or a restaurant where people do not smoke.  Ask your health care provider about having regular tests (screenings) to check for cancer. This may include blood tests, imaging tests, and other tests.  Find ways to manage your stress, such as meditation, yoga, or exercise. Where to find support To get support to quit smoking, consider:  Asking your health care provider for more information and resources.  Taking classes to learn more about quitting smoking.  Looking for local organizations that offer resources about quitting smoking.  Joining a support group for people who want to quit smoking in your local community.  Calling the smokefree.gov counselor helpline: 1-800-Quit-Now (864)641-4232) Where to find more information You may find more information about quitting smoking from:  HelpGuide.org: www.helpguide.org  https://hall.com/: smokefree.gov  American Lung Association: www.lung.org Contact a health care provider if you:  Have problems breathing.  Notice that your lips, nose, or fingers turn blue.  Have chest pain.  Are coughing up blood.  Feel faint or you pass out.  Have other health changes that cause you to worry. Summary  Smoking tobacco can negatively affect your health, the health of those around you, your finances, and your social life.  Do not start smoking. Quit if you already do. If you need help quitting, ask  your health care provider.  Think about joining a support group for people who want to quit smoking in your local community. There are many effective programs that will help you to quit this behavior. This information is not intended to replace advice given to you by your health care provider. Make sure you discuss any questions you have with your health care provider. Document Revised: 03/26/2019 Document Reviewed: 07/16/2016 Elsevier Patient Education  2020 [employer].

## 2020-02-01 NOTE — Assessment & Plan Note (Signed)
History of long-term use of nonsteroidals well refill omeprazole 40 mg daily

## 2020-02-01 NOTE — Assessment & Plan Note (Signed)
History of vitamin B12 deficiency we will recheck levels and asked the patient to begin vitamin B-12 500 mcg daily

## 2020-02-02 LAB — COMPREHENSIVE METABOLIC PANEL
ALT: 13 IU/L (ref 0–32)
AST: 16 IU/L (ref 0–40)
Albumin/Globulin Ratio: 1.9 (ref 1.2–2.2)
Albumin: 4.5 g/dL (ref 3.8–4.8)
Alkaline Phosphatase: 97 IU/L (ref 48–121)
BUN/Creatinine Ratio: 7 — ABNORMAL LOW (ref 9–23)
BUN: 6 mg/dL (ref 6–24)
Bilirubin Total: 0.3 mg/dL (ref 0.0–1.2)
CO2: 21 mmol/L (ref 20–29)
Calcium: 10 mg/dL (ref 8.7–10.2)
Chloride: 102 mmol/L (ref 96–106)
Creatinine, Ser: 0.86 mg/dL (ref 0.57–1.00)
GFR calc Af Amer: 92 mL/min/{1.73_m2} (ref >59)
GFR calc non Af Amer: 80 mL/min/{1.73_m2} (ref >59)
Globulin, Total: 2.4 g/dL (ref 1.5–4.5)
Glucose: 89 mg/dL (ref 65–99)
Potassium: 4.7 mmol/L (ref 3.5–5.2)
Sodium: 139 mmol/L (ref 134–144)
Total Protein: 6.9 g/dL (ref 6.0–8.5)

## 2020-02-02 LAB — CBC WITH DIFFERENTIAL/PLATELET
Basophils Absolute: 0 10*3/uL (ref 0.0–0.2)
Basos: 0 %
EOS (ABSOLUTE): 0.1 10*3/uL (ref 0.0–0.4)
Eos: 1 %
Hematocrit: 44.8 % (ref 34.0–46.6)
Hemoglobin: 15.3 g/dL (ref 11.1–15.9)
Immature Grans (Abs): 0 10*3/uL (ref 0.0–0.1)
Immature Granulocytes: 0 %
Lymphocytes Absolute: 4.4 10*3/uL — ABNORMAL HIGH (ref 0.7–3.1)
Lymphs: 44 %
MCH: 31.8 pg (ref 26.6–33.0)
MCHC: 34.2 g/dL (ref 31.5–35.7)
MCV: 93 fL (ref 79–97)
Monocytes Absolute: 0.8 10*3/uL (ref 0.1–0.9)
Monocytes: 8 %
Neutrophils Absolute: 4.7 10*3/uL (ref 1.4–7.0)
Neutrophils: 47 %
Platelets: 262 10*3/uL (ref 150–450)
RBC: 4.81 x10E6/uL (ref 3.77–5.28)
RDW: 12.2 % (ref 11.7–15.4)
WBC: 10 10*3/uL (ref 3.4–10.8)

## 2020-02-02 LAB — VITAMIN B12: Vitamin B-12: 368 pg/mL (ref 232–1245)

## 2020-02-02 LAB — VITAMIN D 25 HYDROXY (VIT D DEFICIENCY, FRACTURES): Vit D, 25-Hydroxy: 18.3 ng/mL — ABNORMAL LOW (ref 30.0–100.0)

## 2020-02-08 ENCOUNTER — Telehealth: Payer: Self-pay | Admitting: Cardiology

## 2020-02-08 NOTE — Telephone Encounter (Signed)
Unable to reach patient number has been disconnected

## 2020-02-09 ENCOUNTER — Encounter: Payer: Self-pay | Admitting: Surgery

## 2020-02-09 ENCOUNTER — Other Ambulatory Visit: Payer: Self-pay

## 2020-02-09 ENCOUNTER — Ambulatory Visit (INDEPENDENT_AMBULATORY_CARE_PROVIDER_SITE_OTHER): Payer: BC Managed Care – PPO | Admitting: Surgery

## 2020-02-09 VITALS — BP 115/75 | HR 89 | Ht 63.0 in | Wt 187.0 lb

## 2020-02-09 DIAGNOSIS — M4802 Spinal stenosis, cervical region: Secondary | ICD-10-CM

## 2020-02-09 NOTE — Progress Notes (Signed)
49 year old black female history of C5-6 and C6-7 HNP/gnosis comes in for preop evaluation.  States that neck symptoms unchanged from previous visit.  She is want to proceed with C5-6 and C6-7 ACDF as scheduled.  Surgery procedure discussed.  Today history and physical performed.  Patient states that she has been having some left and right-sided low back pain that extends to the abdominal region.  No complaints of nausea vomiting, melena, hematochezia, dysuria, hematuria.  We will proceed with surgery as scheduled.  If her low back/abdominal discomfort worsens she will let us know.

## 2020-02-11 ENCOUNTER — Encounter: Payer: BC Managed Care – PPO | Admitting: Family

## 2020-02-11 ENCOUNTER — Ambulatory Visit: Payer: BC Managed Care – PPO | Admitting: Internal Medicine

## 2020-02-12 ENCOUNTER — Other Ambulatory Visit (HOSPITAL_COMMUNITY)
Admission: RE | Admit: 2020-02-12 | Discharge: 2020-02-12 | Disposition: A | Discharge status: Home / Self Care | Payer: BC Managed Care – PPO | Source: Ambulatory Visit | Attending: Orthopaedic Surgery | Admitting: Orthopaedic Surgery

## 2020-02-12 DIAGNOSIS — Z01812 Encounter for preprocedural laboratory examination: Secondary | ICD-10-CM | POA: Diagnosis present

## 2020-02-12 DIAGNOSIS — Z20822 Contact with and (suspected) exposure to covid-19: Secondary | ICD-10-CM | POA: Diagnosis not present

## 2020-02-12 LAB — SARS CORONAVIRUS 2 (TAT 6-24 HRS): SARS Coronavirus 2: NEGATIVE

## 2020-02-15 ENCOUNTER — Other Ambulatory Visit: Payer: Self-pay

## 2020-02-15 ENCOUNTER — Encounter (HOSPITAL_COMMUNITY): Payer: Self-pay | Admitting: Orthopaedic Surgery

## 2020-02-15 ENCOUNTER — Telehealth: Payer: Self-pay | Admitting: Orthopaedic Surgery

## 2020-02-15 NOTE — H&P (Signed)
Caitlin Valdez is an 49 y.o. female.   Chief Complaint: Neck pain and upper extremity radiculopathy HPI: 49 year old black female history of C5-6 and C6-7 HNP/gnosis comes in for preop evaluation.  States that neck symptoms unchanged from previous visit.  She is want to proceed with C5-6 and C6-7 ACDF as scheduled.   Past Medical History:  Diagnosis Date  . Abdominal pain, right lower quadrant   . Anxiety   . Depression   . Fibromyalgia   . GERD (gastroesophageal reflux disease)   . Migraine   . Obesity   . Peripheral vascular disease (HCC)    RIGHT LEG  . PONV (postoperative nausea and vomiting)   . Stroke ([place])   . Trichomonas 2012    Past Surgical History:  Procedure Laterality Date  . CESAREAN SECTION    . FRACTURE SURGERY    . HYSTEROSCOPY WITH NOVASURE N/A 08/08/2014   Procedure:  NOVASURE;  Surgeon: Emily Filbert, MD;  Location: [facility];  Service: Gynecology;  Laterality: N/A;  . LUMBAR LAMINECTOMY/DECOMPRESSION MICRODISCECTOMY N/A 09/27/2019   Procedure: RIGHT LUMBAR FIVE TO SACRAL ONE MICRODISCECTOMY;  Surgeon: Marybelle Killings, MD;  Location: [facility];  Service: Orthopedics;  Laterality: N/A;  . VEIN SURGERY      Family History  Problem Relation Age of Onset  . Diabetes Mother   . Hypertension Mother   . Breast cancer Mother 13  . Diabetes Father   . Hypertension Father   . Diabetes Other   . Hypertension Other   . Other Neg Hx    Social History:  reports that she has been smoking cigarettes. She has a 5.00 pack-year smoking history. She has never used smokeless tobacco. She reports current drug use. Drug: Marijuana. She reports that she does not drink alcohol.  Allergies: No Known Allergies  No medications prior to admission.    No results found for this or any previous visit (from the past 48 hour(s)). No results found.  Review of Systems  Constitutional: Positive for activity change.  HENT: Negative.   Respiratory: Negative.   Cardiovascular: Negative.    Genitourinary: Negative.   Musculoskeletal: Positive for neck pain and neck stiffness.  Neurological: Positive for numbness.  Psychiatric/Behavioral: Negative.     There were no vitals taken for this visit. Physical Exam HENT:     Head: Normocephalic and atraumatic.  Pulmonary:     Effort: No respiratory distress.  Musculoskeletal:        General: Normal range of motion.     Cervical back: Tenderness present.  Skin:    General: Skin is warm and dry.  Neurological:     General: No focal deficit present.     Mental Status: She is oriented to person, place, and time.      Assessment/Plan C5-6 and C6-7 HNP/stenosis   We will proceed with C5-6 and C6-7 ACDF as scheduled.  Surgical procedure discussed along with potential recovery time.  All questions answered and she wishes to proceed.  Benjiman Core, PA-C 02/15/2020, 2:09 PM

## 2020-02-15 NOTE — Telephone Encounter (Signed)
Pt would like to know if there are any pre op instructions she should know of? Pt states the answer can be given in [place] or email.  [phone]

## 2020-02-15 NOTE — Telephone Encounter (Signed)
Patient aware of the below message  

## 2020-02-15 NOTE — Telephone Encounter (Signed)
NPO after midnight. Nothing else.

## 2020-02-15 NOTE — Progress Notes (Signed)
Caitlin Valdez denies chest pain or shortness of breath. Patient tested negative for Covid_7/31/21_ and has been in quarantine since that time.

## 2020-02-16 ENCOUNTER — Observation Stay (HOSPITAL_COMMUNITY)
Admission: RE | Admit: 2020-02-16 | Discharge: 2020-02-17 | Disposition: A | Discharge status: Home / Self Care | Payer: BC Managed Care – PPO | Attending: Orthopaedic Surgery | Admitting: Orthopaedic Surgery

## 2020-02-16 ENCOUNTER — Ambulatory Visit (HOSPITAL_COMMUNITY): Payer: BC Managed Care – PPO

## 2020-02-16 ENCOUNTER — Ambulatory Visit (HOSPITAL_COMMUNITY): Payer: BC Managed Care – PPO | Admitting: Anesthesiology

## 2020-02-16 ENCOUNTER — Encounter (HOSPITAL_COMMUNITY)
Admission: RE | Disposition: A | Discharge status: Home / Self Care | Payer: Self-pay | Source: Home / Self Care | Attending: Orthopaedic Surgery

## 2020-02-16 ENCOUNTER — Encounter (HOSPITAL_COMMUNITY): Payer: Self-pay | Admitting: Orthopaedic Surgery

## 2020-02-16 DIAGNOSIS — M4802 Spinal stenosis, cervical region: Secondary | ICD-10-CM | POA: Diagnosis not present

## 2020-02-16 DIAGNOSIS — M542 Cervicalgia: Secondary | ICD-10-CM | POA: Diagnosis present

## 2020-02-16 DIAGNOSIS — F17210 Nicotine dependence, cigarettes, uncomplicated: Secondary | ICD-10-CM | POA: Insufficient documentation

## 2020-02-16 DIAGNOSIS — Z419 Encounter for procedure for purposes other than remedying health state, unspecified: Secondary | ICD-10-CM

## 2020-02-16 DIAGNOSIS — M5410 Radiculopathy, site unspecified: Secondary | ICD-10-CM | POA: Diagnosis not present

## 2020-02-16 DIAGNOSIS — M47812 Spondylosis without myelopathy or radiculopathy, cervical region: Principal | ICD-10-CM | POA: Insufficient documentation

## 2020-02-16 HISTORY — DX: Varicose veins of right lower extremity with other complications: I83.891

## 2020-02-16 HISTORY — DX: Unspecified osteoarthritis, unspecified site: M19.90

## 2020-02-16 HISTORY — DX: Post-traumatic stress disorder, unspecified: F43.10

## 2020-02-16 HISTORY — PX: ANTERIOR CERVICAL DECOMP/DISCECTOMY FUSION: SHX1161

## 2020-02-16 LAB — POCT PREGNANCY, URINE: Preg Test, Ur: NEGATIVE

## 2020-02-16 LAB — TYPE AND SCREEN
ABO/RH(D): B POS
Antibody Screen: NEGATIVE

## 2020-02-16 LAB — SURGICAL PCR SCREEN
MRSA, PCR: NEGATIVE
Staphylococcus aureus: POSITIVE — AB

## 2020-02-16 LAB — ABO/RH: ABO/RH(D): B POS

## 2020-02-16 SURGERY — ANTERIOR CERVICAL DECOMPRESSION/DISCECTOMY FUSION 2 LEVELS
Anesthesia: General | Site: Spine Cervical

## 2020-02-16 MED ORDER — LIDOCAINE 2% (20 MG/ML) 5 ML SYRINGE
INTRAMUSCULAR | Status: AC
  Filled 2020-02-16: qty 5

## 2020-02-16 MED ORDER — METHOCARBAMOL 500 MG PO TABS
500.0000 mg | ORAL_TABLET | Freq: Four times a day (QID) | ORAL | Status: DC | PRN
  Administered 2020-02-17: 500 mg via ORAL
  Filled 2020-02-16: qty 1

## 2020-02-16 MED ORDER — DEXAMETHASONE SODIUM PHOSPHATE 10 MG/ML IJ SOLN
INTRAMUSCULAR | Status: AC
  Filled 2020-02-16: qty 1

## 2020-02-16 MED ORDER — ONDANSETRON HCL 4 MG/2ML IJ SOLN
4.0000 mg | Freq: Four times a day (QID) | INTRAMUSCULAR | Status: DC | PRN
  Administered 2020-02-16: 4 mg via INTRAVENOUS
  Filled 2020-02-16: qty 2

## 2020-02-16 MED ORDER — CHLORHEXIDINE GLUCONATE 0.12 % MT SOLN: 15.0000 mL | Freq: Once | OROMUCOSAL | Status: AC

## 2020-02-16 MED ORDER — ONDANSETRON HCL 4 MG/2ML IJ SOLN
INTRAMUSCULAR | Status: AC
  Filled 2020-02-16: qty 2

## 2020-02-16 MED ORDER — DOCUSATE SODIUM 100 MG PO CAPS
100.0000 mg | ORAL_CAPSULE | Freq: Two times a day (BID) | ORAL | Status: DC
  Administered 2020-02-16 – 2020-02-17 (×2): 100 mg via ORAL
  Filled 2020-02-16 (×2): qty 1

## 2020-02-16 MED ORDER — HYDROMORPHONE HCL 1 MG/ML IJ SOLN
INTRAMUSCULAR | Status: AC
  Administered 2020-02-16: 0.5 mg via INTRAVENOUS
  Filled 2020-02-16: qty 1

## 2020-02-16 MED ORDER — ROCURONIUM BROMIDE 10 MG/ML (PF) SYRINGE
PREFILLED_SYRINGE | INTRAVENOUS | Status: DC | PRN
  Administered 2020-02-16: 10 mg via INTRAVENOUS
  Administered 2020-02-16: 60 mg via INTRAVENOUS

## 2020-02-16 MED ORDER — OXYCODONE HCL 5 MG PO TABS
5.0000 mg | ORAL_TABLET | ORAL | Status: DC | PRN
  Administered 2020-02-16 – 2020-02-17 (×4): 10 mg via ORAL
  Filled 2020-02-16 (×4): qty 2

## 2020-02-16 MED ORDER — MIDAZOLAM HCL 5 MG/5ML IJ SOLN
INTRAMUSCULAR | Status: DC | PRN
  Administered 2020-02-16: 2 mg via INTRAVENOUS

## 2020-02-16 MED ORDER — CHLORHEXIDINE GLUCONATE 0.12 % MT SOLN
OROMUCOSAL | Status: AC
  Administered 2020-02-16: 15 mL via OROMUCOSAL
  Filled 2020-02-16: qty 15

## 2020-02-16 MED ORDER — SCOPOLAMINE 1 MG/3DAYS TD PT72
MEDICATED_PATCH | TRANSDERMAL | Status: AC
  Filled 2020-02-16: qty 1

## 2020-02-16 MED ORDER — SODIUM CHLORIDE 0.9% FLUSH: 3.0000 mL | Freq: Two times a day (BID) | INTRAVENOUS | Status: DC

## 2020-02-16 MED ORDER — ORAL CARE MOUTH RINSE: 15.0000 mL | Freq: Once | OROMUCOSAL | Status: AC

## 2020-02-16 MED ORDER — PHENYLEPHRINE 40 MCG/ML (10ML) SYRINGE FOR IV PUSH (FOR BLOOD PRESSURE SUPPORT)
PREFILLED_SYRINGE | INTRAVENOUS | Status: DC | PRN
  Administered 2020-02-16 (×2): 80 ug via INTRAVENOUS
  Administered 2020-02-16: 40 ug via INTRAVENOUS
  Administered 2020-02-16: 80 ug via INTRAVENOUS
  Administered 2020-02-16: 120 ug via INTRAVENOUS

## 2020-02-16 MED ORDER — PROPOFOL 10 MG/ML IV BOLUS
INTRAVENOUS | Status: DC | PRN
  Administered 2020-02-16: 50 mg via INTRAVENOUS
  Administered 2020-02-16: 150 mg via INTRAVENOUS

## 2020-02-16 MED ORDER — PROPOFOL 10 MG/ML IV BOLUS
INTRAVENOUS | Status: AC
  Filled 2020-02-16: qty 20

## 2020-02-16 MED ORDER — BUPIVACAINE HCL (PF) 0.25 % IJ SOLN
INTRAMUSCULAR | Status: AC
  Filled 2020-02-16: qty 30

## 2020-02-16 MED ORDER — MIDAZOLAM HCL 2 MG/2ML IJ SOLN
INTRAMUSCULAR | Status: AC
  Filled 2020-02-16: qty 2

## 2020-02-16 MED ORDER — VITAMIN B-12 1000 MCG PO TABS
500.0000 ug | ORAL_TABLET | Freq: Every day | ORAL | Status: DC
  Administered 2020-02-17: 500 ug via ORAL
  Filled 2020-02-16: qty 1

## 2020-02-16 MED ORDER — PANTOPRAZOLE SODIUM 40 MG PO TBEC
40.0000 mg | DELAYED_RELEASE_TABLET | Freq: Every day | ORAL | Status: DC
  Administered 2020-02-17: 40 mg via ORAL
  Filled 2020-02-16: qty 1

## 2020-02-16 MED ORDER — EPINEPHRINE PF 1 MG/ML IJ SOLN
INTRAMUSCULAR | Status: AC
  Filled 2020-02-16: qty 1

## 2020-02-16 MED ORDER — PHENYLEPHRINE 40 MCG/ML (10ML) SYRINGE FOR IV PUSH (FOR BLOOD PRESSURE SUPPORT)
PREFILLED_SYRINGE | INTRAVENOUS | Status: AC
  Filled 2020-02-16: qty 10

## 2020-02-16 MED ORDER — PHENOL 1.4 % MT LIQD: 1.0000 | OROMUCOSAL | Status: DC | PRN

## 2020-02-16 MED ORDER — DULOXETINE HCL 30 MG PO CPEP: 60.0000 mg | ORAL_CAPSULE | Freq: Every day | ORAL | Status: DC

## 2020-02-16 MED ORDER — MEPERIDINE HCL 25 MG/ML IJ SOLN: 6.2500 mg | INTRAMUSCULAR | Status: DC | PRN

## 2020-02-16 MED ORDER — EPINEPHRINE PF 1 MG/ML IJ SOLN
INTRAMUSCULAR | Status: DC | PRN
  Administered 2020-02-16: 2 mg

## 2020-02-16 MED ORDER — SODIUM CHLORIDE (PF) 0.9 % IJ SOLN
INTRAMUSCULAR | Status: AC
  Filled 2020-02-16: qty 20

## 2020-02-16 MED ORDER — SODIUM CHLORIDE 0.9 % IR SOLN
Status: DC | PRN
  Administered 2020-02-16: 1000 mL

## 2020-02-16 MED ORDER — SUFENTANIL CITRATE 50 MCG/ML IV SOLN
INTRAVENOUS | Status: DC | PRN
  Administered 2020-02-16: 20 ug via INTRAVENOUS
  Administered 2020-02-16: 5 ug via INTRAVENOUS
  Administered 2020-02-16: 10 ug via INTRAVENOUS
  Administered 2020-02-16: 5 ug via INTRAVENOUS
  Administered 2020-02-16: 10 ug via INTRAVENOUS

## 2020-02-16 MED ORDER — ACETAMINOPHEN 325 MG PO TABS
650.0000 mg | ORAL_TABLET | ORAL | Status: DC | PRN
  Administered 2020-02-16 – 2020-02-17 (×4): 650 mg via ORAL
  Filled 2020-02-16 (×4): qty 2

## 2020-02-16 MED ORDER — HYDROXYZINE HCL 50 MG/ML IM SOLN: 50.0000 mg | Freq: Four times a day (QID) | INTRAMUSCULAR | Status: DC | PRN

## 2020-02-16 MED ORDER — CEFAZOLIN SODIUM-DEXTROSE 2-4 GM/100ML-% IV SOLN
INTRAVENOUS | Status: AC
  Filled 2020-02-16: qty 100

## 2020-02-16 MED ORDER — LACTATED RINGERS IV SOLN: INTRAVENOUS | Status: DC

## 2020-02-16 MED ORDER — SODIUM CHLORIDE 0.9% FLUSH
3.0000 mL | INTRAVENOUS | Status: DC | PRN
  Administered 2020-02-16: 3 mL via INTRAVENOUS

## 2020-02-16 MED ORDER — SUFENTANIL CITRATE 50 MCG/ML IV SOLN
INTRAVENOUS | Status: AC
  Filled 2020-02-16: qty 1

## 2020-02-16 MED ORDER — BUPIVACAINE-EPINEPHRINE 0.25% -1:200000 IJ SOLN
INTRAMUSCULAR | Status: DC | PRN
  Administered 2020-02-16: 6 mL

## 2020-02-16 MED ORDER — DEXAMETHASONE SODIUM PHOSPHATE 10 MG/ML IJ SOLN
INTRAMUSCULAR | Status: DC | PRN
  Administered 2020-02-16: 10 mg via INTRAVENOUS

## 2020-02-16 MED ORDER — LORATADINE 10 MG PO TABS: 10.0000 mg | ORAL_TABLET | Freq: Every day | ORAL | Status: DC | PRN

## 2020-02-16 MED ORDER — VITAMIN D (ERGOCALCIFEROL) 1.25 MG (50000 UNIT) PO CAPS: 50000.0000 [IU] | ORAL_CAPSULE | ORAL | Status: DC

## 2020-02-16 MED ORDER — MENTHOL 3 MG MT LOZG
1.0000 | LOZENGE | OROMUCOSAL | Status: DC | PRN
  Filled 2020-02-16: qty 9

## 2020-02-16 MED ORDER — LIDOCAINE 2% (20 MG/ML) 5 ML SYRINGE
INTRAMUSCULAR | Status: DC | PRN
  Administered 2020-02-16: 100 mg via INTRAVENOUS

## 2020-02-16 MED ORDER — PHENYLEPHRINE HCL (PRESSORS) 10 MG/ML IV SOLN
INTRAVENOUS | Status: DC | PRN
Start: 2020-02-16 — End: 2020-02-16

## 2020-02-16 MED ORDER — ONDANSETRON HCL 4 MG PO TABS: 4.0000 mg | ORAL_TABLET | Freq: Four times a day (QID) | ORAL | Status: DC | PRN

## 2020-02-16 MED ORDER — PROMETHAZINE HCL 25 MG/ML IJ SOLN: 6.2500 mg | INTRAMUSCULAR | Status: DC | PRN

## 2020-02-16 MED ORDER — SODIUM CHLORIDE 0.9 % IV SOLN: INTRAVENOUS | Status: DC

## 2020-02-16 MED ORDER — GABAPENTIN 300 MG PO CAPS
600.0000 mg | ORAL_CAPSULE | Freq: Three times a day (TID) | ORAL | Status: DC
  Administered 2020-02-16 – 2020-02-17 (×2): 600 mg via ORAL
  Filled 2020-02-16 (×2): qty 2

## 2020-02-16 MED ORDER — ACETAMINOPHEN 650 MG RE SUPP: 650.0000 mg | RECTAL | Status: DC | PRN

## 2020-02-16 MED ORDER — ROCURONIUM BROMIDE 10 MG/ML (PF) SYRINGE
PREFILLED_SYRINGE | INTRAVENOUS | Status: AC
  Filled 2020-02-16: qty 10

## 2020-02-16 MED ORDER — METHOCARBAMOL 1000 MG/10ML IJ SOLN
500.0000 mg | Freq: Four times a day (QID) | INTRAVENOUS | Status: DC | PRN
  Administered 2020-02-16: 500 mg via INTRAVENOUS
  Filled 2020-02-16 (×2): qty 5

## 2020-02-16 MED ORDER — HYDROMORPHONE HCL 1 MG/ML IJ SOLN
0.2500 mg | INTRAMUSCULAR | Status: DC | PRN
  Administered 2020-02-16 (×2): 0.5 mg via INTRAVENOUS

## 2020-02-16 MED ORDER — HYDROMORPHONE HCL 1 MG/ML IJ SOLN
0.5000 mg | INTRAMUSCULAR | Status: DC | PRN
  Administered 2020-02-16: 0.5 mg via INTRAVENOUS
  Filled 2020-02-16: qty 0.5

## 2020-02-16 MED ORDER — SCOPOLAMINE 1 MG/3DAYS TD PT72
MEDICATED_PATCH | TRANSDERMAL | Status: DC | PRN
  Administered 2020-02-16: 1 via TRANSDERMAL

## 2020-02-16 MED ORDER — POLYETHYLENE GLYCOL 3350 17 G PO PACK
17.0000 g | PACK | Freq: Every day | ORAL | Status: DC
  Filled 2020-02-16: qty 1

## 2020-02-16 MED ORDER — ONDANSETRON HCL 4 MG/2ML IJ SOLN
INTRAMUSCULAR | Status: DC | PRN
  Administered 2020-02-16: 4 mg via INTRAVENOUS

## 2020-02-16 SURGICAL SUPPLY — 52 items
0.25% Bupivacaine ×2 IMPLANT
BENZOIN TINCTURE PRP APPL 2/3 (GAUZE/BANDAGES/DRESSINGS) ×2 IMPLANT
BIT DRILL SPINE QC 12 (BIT) ×2 IMPLANT
BLADE CLIPPER SURG (BLADE) IMPLANT
BUR ROUND FLUTED 4 SOFT TCH (BURR) IMPLANT
COLLAR CERV LO CONTOUR FIRM DE (SOFTGOODS) ×2 IMPLANT
CORD BIPOLAR FORCEPS 12FT (ELECTRODE) ×2 IMPLANT
COVER SURGICAL LIGHT HANDLE (MISCELLANEOUS) ×2 IMPLANT
COVER WAND RF STERILE (DRAPES) ×2 IMPLANT
DRAPE C-ARM 42X72 X-RAY (DRAPES) ×2 IMPLANT
DRAPE HALF SHEET 40X57 (DRAPES) ×2 IMPLANT
DRAPE MICROSCOPE LEICA (MISCELLANEOUS) ×2 IMPLANT
DURAPREP 6ML APPLICATOR 50/CS (WOUND CARE) ×2 IMPLANT
ELECT COATED BLADE 2.86 ST (ELECTRODE) ×2 IMPLANT
ELECT REM PT RETURN 9FT ADLT (ELECTROSURGICAL) ×2
ELECTRODE REM PT RTRN 9FT ADLT (ELECTROSURGICAL) ×1 IMPLANT
EVACUATOR 1/8 PVC DRAIN (DRAIN) ×2 IMPLANT
GAUZE SPONGE 4X4 12PLY STRL (GAUZE/BANDAGES/DRESSINGS) ×2 IMPLANT
GLOVE BIOGEL PI IND STRL 8 (GLOVE) ×2 IMPLANT
GLOVE BIOGEL PI INDICATOR 8 (GLOVE) ×2
GLOVE ORTHO TXT STRL SZ7.5 (GLOVE) ×4 IMPLANT
GOWN STRL REUS W/ TWL LRG LVL3 (GOWN DISPOSABLE) ×1 IMPLANT
GOWN STRL REUS W/ TWL XL LVL3 (GOWN DISPOSABLE) ×1 IMPLANT
GOWN STRL REUS W/TWL 2XL LVL3 (GOWN DISPOSABLE) ×2 IMPLANT
GOWN STRL REUS W/TWL LRG LVL3 (GOWN DISPOSABLE) ×2
GOWN STRL REUS W/TWL XL LVL3 (GOWN DISPOSABLE) ×2
HALTER HD/CHIN CERV TRACTION D (MISCELLANEOUS) ×2 IMPLANT
HEMOSTAT SURGICEL 2X14 (HEMOSTASIS) IMPLANT
KIT BASIN OR (CUSTOM PROCEDURE TRAY) ×2 IMPLANT
KIT TURNOVER KIT B (KITS) ×2 IMPLANT
MANIFOLD NEPTUNE II (INSTRUMENTS) IMPLANT
NEEDLE 25GX 5/8IN NON SAFETY (NEEDLE) ×2 IMPLANT
NS IRRIG 1000ML POUR BTL (IV SOLUTION) ×2 IMPLANT
PACK ORTHO CERVICAL (CUSTOM PROCEDURE TRAY) ×2 IMPLANT
PAD ARMBOARD 7.5X6 YLW CONV (MISCELLANEOUS) ×4 IMPLANT
PATTIES SURGICAL .5 X.5 (GAUZE/BANDAGES/DRESSINGS) IMPLANT
PLATE ANT CERV XTEND 2 LV 30 (Plate) ×2 IMPLANT
POSITIONER HEAD DONUT 9IN (MISCELLANEOUS) ×2 IMPLANT
RESTRAINT LIMB HOLDER UNIV (RESTRAINTS) IMPLANT
SCREW XTD VAR 4.2 SELF TAP 12 (Screw) ×12 IMPLANT
SPACER CERVICAL FRGE 12X14X6-7 (Spacer) ×2 IMPLANT
SPACER CERVICAL FRGE 12X14X7-7 (Spacer) ×2 IMPLANT
STRIP CLOSURE SKIN 1/2X4 (GAUZE/BANDAGES/DRESSINGS) ×2 IMPLANT
SURGIFLO W/THROMBIN 8M KIT (HEMOSTASIS) IMPLANT
SUT BONE WAX W31G (SUTURE) ×2 IMPLANT
SUT VIC AB 3-0 X1 27 (SUTURE) ×2 IMPLANT
SUT VIC AB 4-0 PS2 27 (SUTURE) ×2 IMPLANT
SUT VICRYL 4-0 PS2 18IN ABS (SUTURE) ×4 IMPLANT
TOWEL GREEN STERILE (TOWEL DISPOSABLE) ×2 IMPLANT
TOWEL GREEN STERILE FF (TOWEL DISPOSABLE) ×2 IMPLANT
TRAY FOLEY W/BAG SLVR 16FR (SET/KITS/TRAYS/PACK)
TRAY FOLEY W/BAG SLVR 16FR ST (SET/KITS/TRAYS/PACK) IMPLANT

## 2020-02-16 NOTE — Discharge Instructions (Signed)
Ok to shower 5 days postop.    Do not apply any creams or ointments to incision.  Do not remove steri-strips.    Can use 4x4 gauze and tape for dressing changes.  No aggressive activity.   Cervical collar must be on at all times even when showering.    Do not bend or turn neck.    No driving.

## 2020-02-16 NOTE — Op Note (Signed)
Preop diagnosis: Cervical spondylosis with stenosis C5-6, C6-7.  Postop diagnosis: Same  Procedure: C5-6, C6/7 anterior cervical discectomy and fusion, allograft and plate.  Surgeon Rodell Perna, MD  Assistant: Benjiman Core, PA-C medically necessary and present for the entire procedure  Anesthesia: TOT plus Marcaine 6 cc local.  EBL: 100 cc  Implants:XTEND Globus 30 mm plate.  12 mm x 4.2 mm screws x6.  Forge allograft 6 mm at the C5-6 level and 7 mm at C6-7.  Drains: 1 Hemovac neck.  Procedure: After induction of general scheduled recommendation wrist restraints applied calf pumpers adult retraction with tape without weight neck was prepped with DuraPrep the area squared with towel sterile skin marker Betadine Steri-Drape sterile metal stent the head and thyroid sheets and drapes were applied.  A timeout procedure was completed.  Ancef was given prophylactically.  Incision was made starting midline extending the left.  Platysma was split in line with the fibers.  Blunt dissection down to the level of the prominent spurs at C6-7 C5-6 with needle 25-gauge short placed at the C5-6 level.  Operative microscope was draped brought in and spurs removed 4 mm bur was used.  Uncovertebral joints were removed.  There was overhanging posterior spurs as well as thick ligaments contributing to the stenosis.  Section was performed till there was full decompression of the dura uncovertebral joints were stripped right and left trial sizing's was performed and a 6 mm graft was selected.  It was countersunk by 1 mm.  Identical procedure was repeated at C6-7 level.  At this level there was more disc protrusion behind the spurs which was decompressed.  7 mm graft was selected and countersunk 1 mm.  30 mm plate was selected.  Held with the prong initially the prong on removal broke off in the holder.  The remaining spike was removed with a needle driver.  Plate was suggested some additional spurs removed off  anteriorly so the plate would sit down flush plate was replaced and identical small tiny hole centrally in the plate was lined up with the previous spike which is in good position rotation was confirmed with C arm x-ray then holes were filled drilled and self-tapping screws were placed after drilling.  Lateral fluoroscopic view showed that there was spur at the C5 level which allowed the plate still a bit flatter.  For the screws were placed and spur was removed plate was then placed down and all screws were filled checked with final spot AP and lateral fluoroscopic images and then using the tiny screwdriver each locking screw was tightened x6.  Hemovacs placed in and out technique operative field was dry.  Platysma closed with 3-0 Vicryl 4-0 Vicryl subcuticular closure Marcaine infiltration tincture benzoin Steri-Strips 4 x 4's tape and soft cervical collar.  Patient tolerated procedure well transfer care in stable condition.

## 2020-02-16 NOTE — Anesthesia Procedure Notes (Signed)
Procedure Name: Intubation Date/Time: 02/16/2020 12:55 PM Performed by: Moshe Salisbury, CRNA Pre-anesthesia Checklist: Patient identified, Emergency Drugs available, Suction available and Patient being monitored Patient Re-evaluated:Patient Re-evaluated prior to induction Oxygen Delivery Method: Circle System Utilized Preoxygenation: Pre-oxygenation with 100% oxygen Induction Type: IV induction Ventilation: Mask ventilation without difficulty Laryngoscope Size: Glidescope and 4 Tube type: Oral Tube size: 7.5 mm Number of attempts: 1 Airway Equipment and Method: Stylet and Oral airway Placement Confirmation: ETT inserted through vocal cords under direct vision,  positive ETCO2 and breath sounds checked- equal and bilateral Secured at: 20 cm Tube secured with: Tape Dental Injury: Teeth and Oropharynx as per pre-operative assessment

## 2020-02-16 NOTE — Anesthesia Preprocedure Evaluation (Addendum)
Anesthesia Evaluation  Patient identified by MRN, date of birth, ID band Patient awake    Reviewed: Allergy & Precautions, H&P , NPO status , Patient's Chart, lab work & pertinent test results  History of Anesthesia Complications (+) PONV and history of anesthetic complications  Airway Mallampati: II  TM Distance: >3 FB Neck ROM: Full    Dental no notable dental hx. (+) Dental Advisory Given, Teeth Intact   Pulmonary neg pulmonary ROS, Current Smoker and Patient abstained from smoking.,    Pulmonary exam normal breath sounds clear to auscultation       Cardiovascular Exercise Tolerance: Good + Peripheral Vascular Disease  Normal cardiovascular exam Rhythm:Regular Rate:Normal     Neuro/Psych  Headaches, PSYCHIATRIC DISORDERS Anxiety Depression  Neuromuscular disease CVA    GI/Hepatic Neg liver ROS, GERD  Medicated,  Endo/Other  negative endocrine ROS  Renal/GU negative Renal ROS     Musculoskeletal  (+) Arthritis , Fibromyalgia -  Abdominal (+) + obese,   Peds  Hematology negative hematology ROS (+)   Anesthesia Other Findings   Reproductive/Obstetrics negative OB ROS                            Anesthesia Physical  Anesthesia Plan  ASA: III  Anesthesia Plan: General   Post-op Pain Management:    Induction: Intravenous  PONV Risk Score and Plan: 4 or greater and Ondansetron, Dexamethasone, Midazolam, Treatment may vary due to age or medical condition, Scopolamine patch - Pre-op and Propofol infusion  Airway Management Planned: Oral ETT and Video Laryngoscope Planned  Additional Equipment: None  Intra-op Plan:   Post-operative Plan: Extubation in OR  Informed Consent: I have reviewed the patients History and Physical, chart, labs and discussed the procedure including the risks, benefits and alternatives for the proposed anesthesia with the patient or authorized representative  who has indicated his/her understanding and acceptance.     Dental advisory given  Plan Discussed with: CRNA  Anesthesia Plan Comments:       Anesthesia Quick Evaluation

## 2020-02-16 NOTE — Transfer of Care (Signed)
Immediate Anesthesia Transfer of Care Note  Patient: Caitlin Valdez  Procedure(s) Performed: CERVICAL FIVE THROUGH CERVICAL SIX, CERVICAL SIX  THROUGH CERVICAL SEVEN ANTERIOR CERVICAL DECOMPRESSION/DISCECTOMY FUSION, ALLOGRAFT, PLATE (N/A Spine Cervical)  Patient Location: PACU  Anesthesia Type:General  Level of Consciousness: drowsy and patient cooperative  Airway & Oxygen Therapy: Patient Spontanous Breathing and Patient connected to nasal cannula oxygen  Post-op Assessment: Report given to RN, Post -op Vital signs reviewed and stable and Patient moving all extremities  Post vital signs: Reviewed and stable  Last Vitals:  Vitals Value Taken Time  BP 119/76 02/16/20 1531  Temp    Pulse 110 02/16/20 1532  Resp 13 02/16/20 1532  SpO2 98 % 02/16/20 1532  Vitals shown include unvalidated device data.  Last Pain:  Vitals:   02/16/20 1530  TempSrc:   PainSc: (P) 0-No pain      Patients Stated Pain Goal: 3 (91/66/06 0045)  Complications: No complications documented.

## 2020-02-16 NOTE — Interval H&P Note (Signed)
History and Physical Interval Note:  02/16/2020 12:32 PM  [name]  has presented today for surgery, with the diagnosis of cervical spondylosis c5-6, c6-7.  The various methods of treatment have been discussed with the patient and family. After consideration of risks, benefits and other options for treatment, the patient has consented to  Procedure(s): c5-6, c6-7 ANTERIOR CERVICAL DECOMPRESSION/DISCECTOMY FUSION, ALLOGRAFT, PLATE (N/A) as a surgical intervention.  The patient's history has been reviewed, patient examined, no change in status, stable for surgery.  I have reviewed the patient's chart and labs.  Questions were answered to the patient's satisfaction.     Caitlin Valdez

## 2020-02-16 NOTE — Progress Notes (Signed)
Orthopedic Tech Progress Note Patient Details:  Caitlin Valdez Jul 04, 1971 [id number] Dropped off collar in room.  Ortho Devices Type of Ortho Device: Soft collar Ortho Device/Splint Interventions: Ordered   Post Interventions Patient Tolerated: Other (comment) Instructions Provided: Other (comment)   Ellouise Newer 02/16/2020, 5:23 PM

## 2020-02-17 DIAGNOSIS — M47812 Spondylosis without myelopathy or radiculopathy, cervical region: Secondary | ICD-10-CM | POA: Diagnosis not present

## 2020-02-17 MED ORDER — METHOCARBAMOL 500 MG PO TABS: 500.0000 mg | ORAL_TABLET | Freq: Four times a day (QID) | ORAL | 0 refills | Status: DC | PRN

## 2020-02-17 MED ORDER — OXYCODONE-ACETAMINOPHEN 5-325 MG PO TABS: 1.0000 | ORAL_TABLET | ORAL | 0 refills | Status: DC | PRN

## 2020-02-17 NOTE — Progress Notes (Signed)
Pt doing well. Pt given D/C instructions with verbal understanding. Rx was sent to the pharmacy per MD. Pt's incision is clean and dry with no sign of infection. Pt's IV and Hemovac were removed prior to D/C. Pt D/C'd home via wheelchair per MD order. Pt is stable @ D/C and has no other needs at this time. Holli Humbles, RN

## 2020-02-17 NOTE — Anesthesia Postprocedure Evaluation (Signed)
Anesthesia Post Note  Patient: Caitlin Valdez  Procedure(s) Performed: CERVICAL FIVE THROUGH CERVICAL SIX, CERVICAL SIX  THROUGH CERVICAL SEVEN ANTERIOR CERVICAL DECOMPRESSION/DISCECTOMY FUSION, ALLOGRAFT, PLATE (N/A Spine Cervical)     Patient location during evaluation: PACU Anesthesia Type: General Level of consciousness: sedated and patient cooperative Pain management: pain level controlled Vital Signs Assessment: post-procedure vital signs reviewed and stable Respiratory status: spontaneous breathing Cardiovascular status: stable Anesthetic complications: no   No complications documented.  Last Vitals:  Vitals:   02/17/20 0325 02/17/20 0752  BP: 117/74 109/75  Pulse: 83 71  Resp: 18 18  Temp: 37.1 C 36.8 C  SpO2: 98% 100%    Last Pain:  Vitals:   02/17/20 0752  TempSrc: Oral  PainSc:                  Nolon Nations

## 2020-02-17 NOTE — Evaluation (Signed)
Occupational Therapy Evaluation Patient Details Name: Caitlin Valdez MRN: [id number] DOB: Nov 25, 1970 Today's Date: 02/17/2020    History of Present Illness Patinet is a 49 year old woman with PMhx of PVD, stroke, lumbar surgery, fibromyalgia s/p ACDFc5-6,6-7.   Clinical Impression   Patient is a 49 year old woman s/p ACDF who presents with functional ROm, strength and coordination of upper extremities, reports of mild numbness bilaterally and with cervical precautions. Patient educated on spinal precautions and compensatory strategies for ADLs and home activities. Patient predominantly modified independent and performing ADLs without assistance. Patient verbalized understanding of cervical precautions, hand out provided, and verbalized understanding of compensatory strategies. Patient has shower chair, hand held shower head and the assistance of her mother at discharge. No further OT needs.    Follow Up Recommendations  No OT follow up    Equipment Recommendations  None recommended by OT    Recommendations for Other Services       Precautions / Restrictions Precautions Precautions: Cervical Precaution Booklet Issued: Yes (comment) Precaution Comments: Collar must be on at all times. Required Braces or Orthoses: Cervical Brace Cervical Brace: Soft collar Restrictions Weight Bearing Restrictions: No      Mobility Bed Mobility Overal bed mobility: Modified Independent             General bed mobility comments: Patient demonstrated log roll technique to transfer in and out of bed. Patient familiar with spinal precautions from prior back surgery.  Transfers Overall transfer level: Modified independent Equipment used: Straight cane             General transfer comment: Patient demonstrated ability to perform ambulation in room with cane.    Balance                                           ADL either performed or assessed with clinical judgement    ADL Overall ADL's : Needs assistance/impaired Eating/Feeding: Independent   Grooming: Oral care;Standing;Independent Grooming Details (indicate cue type and reason): Patient reports performing grooming this a.m. in bathroom. Reports spitting in toilet to reduce bending. Therapist instructed patient on use of cups as compensatory strategies. Upper Body Bathing: Independent   Lower Body Bathing: Modified independent Lower Body Bathing Details (indicate cue type and reason): Instructed patinet on use of hand held shower head for bathing and use of shower chair. Upper Body Dressing : Modified independent   Lower Body Dressing: Independent Lower Body Dressing Details (indicate cue type and reason): Patient reports donning underwear and pants this morning.Therapist instruced patient on compensatory strategies for dressing to limit B/L/T. Toilet Transfer: Dentist and Hygiene: Independent               Vision Baseline Vision/History: No visual deficits Vision Assessment?: No apparent visual deficits     Perception     Praxis      Pertinent Vitals/Pain Pain Assessment: Faces Faces Pain Scale: Hurts little more Pain Location: neck Pain Descriptors / Indicators: Grimacing;Sore Pain Intervention(s): Limited activity within patient's tolerance     Hand Dominance     Extremity/Trunk Assessment Upper Extremity Assessment Upper Extremity Assessment: Overall WFL for tasks assessed (Strength testing deferred secondary to cervical precautions. Patient exhibtis functional ROM, strength and coordination of bilateral upper extremities. Reports numbness bilaterally since prior to surgery.)       Cervical / Trunk Assessment  Cervical / Trunk Assessment: Other exceptions Cervical / Trunk Exceptions: s/p cervical surgery, soft collar placed   Communication Communication Communication: No difficulties   Cognition Arousal/Alertness:  Awake/alert Behavior During Therapy: WFL for tasks assessed/performed Overall Cognitive Status: Within Functional Limits for tasks assessed                                     General Comments       Exercises     Shoulder Instructions      Home Living Family/patient expects to be discharged to:: Private residence Living Arrangements: Alone Available Help at Discharge: Family;Available 24 hours/day Type of Home: Apartment Home Access: Level entry     Home Layout: One level     Bathroom Shower/Tub: Teacher, early years/pre: Standard     Home Equipment: Marine scientist - single point          Prior Functioning/Environment Level of Independence: Independent                 OT Problem List: Pain;Decreased activity tolerance      OT Treatment/Interventions:      OT Goals(Current goals can be found in the care plan section) Acute Rehab OT Goals Patient Stated Goal: to reduce neck pain OT Goal Formulation: All assessment and education complete, DC therapy  OT Frequency:     Barriers to D/C:            Co-evaluation              AM-PAC OT "6 Clicks" Daily Activity     Outcome Measure Help from another person eating meals?: None Help from another person taking care of personal grooming?: None Help from another person toileting, which includes using toliet, bedpan, or urinal?: None Help from another person bathing (including washing, rinsing, drying)?: None Help from another person to put on and taking off regular upper body clothing?: None Help from another person to put on and taking off regular lower body clothing?: None 6 Click Score: 24   End of Session Nurse Communication:  (No OT needs)  Activity Tolerance: Patient tolerated treatment well Patient left: in bed;with call bell/phone within reach  OT Visit Diagnosis: Pain Pain - part of body:  (anterior neck)                Time: 6010-9323 OT Time Calculation  (min): 18 min Charges:  OT General Charges $OT Visit: 1 Visit OT Evaluation $OT Eval Low Complexity: 1 Low  Leman Martinek, OTR/L [facility]  Office [phone] Pager: [phone]   Lenward Chancellor 02/17/2020, 9:44 AM

## 2020-02-17 NOTE — Progress Notes (Signed)
Subjective: Patient doing well.  Preop arm pain is gone.  Continues to have some residual numbness and tingling.  No complaints of dysphagia or difficulty breathing.  Wants to go home.   Objective: Vital signs in last 24 hours: Temp:  [98.1 F (36.7 C)-98.8 F (37.1 C)] 98.2 F (36.8 C) (08/05 0752) Pulse Rate:  [63-116] 71 (08/05 0752) Resp:  [12-20] 18 (08/05 0752) BP: (109-125)/(74-87) 109/75 (08/05 0752) SpO2:  [95 %-100 %] 100 % (08/05 0752)  Intake/Output from previous day: 08/04 0701 - 08/05 0700 In: 870 [P.O.:120; I.V.:650; IV Piggyback:100] Out: 670 [Urine:550; Drains:20; Blood:100] Intake/Output this shift: Total I/O In: 240 [P.O.:240] Out: -   No results for input(s): HGB in the last 72 hours. No results for input(s): WBC, RBC, HCT, PLT in the last 72 hours. No results for input(s): NA, K, CL, CO2, BUN, CREATININE, GLUCOSE, CALCIUM in the last 72 hours. No results for input(s): LABPT, INR in the last 72 hours.  Exam Pleasant female alert and oriented in no acute distress.  Drain was removed.  New dressing applied.  No respiratory distress.  Neurologically intact.  Ambulating without difficulty.    Assessment/Plan: Discharge home today.  Prescriptions for Percocet and Robaxin sent to her pharmacy.  Follow-up in 1 week for recheck.  Benjiman Core 02/17/2020, 11:29 AM

## 2020-02-18 ENCOUNTER — Telehealth: Payer: Self-pay

## 2020-02-18 ENCOUNTER — Encounter (HOSPITAL_COMMUNITY): Payer: Self-pay | Admitting: Orthopaedic Surgery

## 2020-02-18 NOTE — Telephone Encounter (Signed)
Transition Care Management Follow-up Telephone Call Date of discharge and from where: 02/16/2020, [facility]   How have you been since you were released from the hospital? Feeling in pain takes meds as prescribed  Any questions or concerns? None  Items Reviewed: Did the pt receive and understand the discharge instructions provided? YES Medications obtained and verified? YES  Any new allergies since your discharge? NONE Dietary orders reviewed? Yes  Do you have support at home? Mother and friends  Functional Questionnaire: (I = Independent and D = Dependent) ADLs: I   Follow up appointments reviewed:  PCP Hospital f/u appt confirmed?  Scheduled to see Dr Margarita Rana on 02/22/2020  Specialist Hospital f/u appt confirmed? Not yet will call the office today  Are transportation arrangements needed? NO  If their condition worsens, /is the pt aware to call PCP or go to the Emergency Dept.?  Pt is aware if condition is worsening or start experiencing any of diff breathing, SOB, chest pain, extreme fatigue,fevers, any signs of infections, Persistent nausea and vomiting, bleeding , rapid weight gain, severe uncontrolled pain, or visual disturbances to return to ED  Was the patient provided with contact information for the PCP's office or ED? YES given.  Was to pt encouraged to call back with questions or concerns?YES name and contact information .  DME spirometer . Stated using it daily

## 2020-02-22 ENCOUNTER — Encounter: Payer: BC Managed Care – PPO | Admitting: Family Medicine

## 2020-02-23 ENCOUNTER — Encounter: Payer: Self-pay | Admitting: Family Medicine

## 2020-02-23 ENCOUNTER — Other Ambulatory Visit: Payer: Self-pay

## 2020-02-23 ENCOUNTER — Other Ambulatory Visit (HOSPITAL_COMMUNITY)
Admission: RE | Admit: 2020-02-23 | Discharge: 2020-02-23 | Disposition: A | Discharge status: Home / Self Care | Payer: BC Managed Care – PPO | Source: Ambulatory Visit | Attending: Family Medicine | Admitting: Family Medicine

## 2020-02-23 ENCOUNTER — Telehealth: Payer: Self-pay

## 2020-02-23 ENCOUNTER — Ambulatory Visit: Payer: BC Managed Care – PPO | Attending: Family | Admitting: Family Medicine

## 2020-02-23 VITALS — BP 114/73 | HR 97 | Ht 63.0 in | Wt 196.0 lb

## 2020-02-23 DIAGNOSIS — M797 Fibromyalgia: Secondary | ICD-10-CM

## 2020-02-23 DIAGNOSIS — Z113 Encounter for screening for infections with a predominantly sexual mode of transmission: Secondary | ICD-10-CM | POA: Insufficient documentation

## 2020-02-23 DIAGNOSIS — R519 Headache, unspecified: Secondary | ICD-10-CM | POA: Diagnosis not present

## 2020-02-23 DIAGNOSIS — Z9889 Other specified postprocedural states: Secondary | ICD-10-CM | POA: Diagnosis not present

## 2020-02-23 DIAGNOSIS — Z981 Arthrodesis status: Secondary | ICD-10-CM | POA: Diagnosis not present

## 2020-02-23 MED ORDER — LIDOCAINE 5 % EX PTCH: 1.0000 | MEDICATED_PATCH | CUTANEOUS | 1 refills | Status: DC

## 2020-02-23 MED ORDER — BUTALBITAL-APAP-CAFFEINE 50-325-40 MG PO TABS: 1.0000 | ORAL_TABLET | Freq: Two times a day (BID) | ORAL | 1 refills | Status: DC | PRN

## 2020-02-23 NOTE — Patient Instructions (Signed)
Cervical Fusion, Care After This sheet gives you information about how to care for yourself after your procedure. Your health care provider may also give you more specific instructions. If you have problems or questions, contact your health care provider. What can I expect after the procedure? After the procedure, it is common to have:  Incision area pain.  Numbness.  Weakness.  Sore throat.  Difficulty swallowing. Follow these instructions at home: Medicines  Take over-the-counter and prescription medicines, including pain medicines, only as told by your health care provider.  If you were prescribed an antibiotic medicine, take it as told by your health care provider. Do not stop taking the antibiotic even if you start to feel better. If you have a brace:  Wear the brace as told by your health care provider. Remove it only as told by your health care provider.  Keep the brace clean. Incision care   Follow instructions from your health care provider about how to take care of your incision. Make sure you: ? Wash your hands with soap and water before you change your bandage (dressing). If soap and water are not available, use hand sanitizer. ? Change your dressing as told by your health care provider. ? Leave stitches (sutures), skin glue, or adhesive strips in place. These skin closures may need to be in place for 2 weeks or longer. If adhesive strip edges start to loosen and curl up, you may trim the loose edges. Do not remove adhesive strips completely unless your health care provider tells you to do that.  Keep your incision clean and dry. Do not take baths, swim, or use a hot tub until your health care provider approves.  Check your incision area every day for signs of infection. Check for: ? More redness, swelling, or pain. ? More fluid or blood. ? Warmth. ? Pus or a bad smell. Activity   Rest and protect your back as much as possible.  Do not lift anything that is  heavier than 10 lb (4.5 kg) or the limit that you are told by your health care provider.  Do not twist or bend at the waist until your health care provider approves.  Avoid: ? Pushing and pulling motions. ? Lifting anything over your head. ? Sitting or lying down in the same position for long periods of time.  Do not exercise until your health care provider approves. Once your health care provider has approved exercise, ask him or her what kinds of exercises you can do to make your back stronger (physical therapy).  Do not drive until your health care provider approves. ? Do not drive for 24 hours if you received a medicine to help you relax (sedative). ? Do not drive or use heavy machinery while taking prescription pain medicine. General instructions  Have someone assist you to turn in bed frequently by moving your whole body without twisting your back (log rolling technique).  Wear compression stockings as told by your health care provider. These stockings help to prevent blood clots and reduce swelling in your legs.  Do not use any products that contain nicotine or tobacco, such as cigarettes and e-cigarettes. These can delay bone healing. If you need help quitting, ask your health care provider.  To prevent or treat constipation while you are taking prescription pain medicine, your health care provider may recommend that you: ? Drink enough fluid to keep your urine clear or pale yellow. ? Take over-the-counter or prescription medicines. ? Eat foods that  are high in fiber, such as fresh fruits and vegetables, whole grains, and beans. ? Limit foods that are high in fat and processed sugars, such as fried and sweet foods.  Keep all follow-up visits as told by your health care provider. This is important. Contact a health care provider if:  You have pain that gets worse or does not get better with medicine.  You have more redness, swelling, or pain around your incision.  You have  more fluid or blood coming from your incision.  Your incision feels warm to the touch.  You have pus or a bad smell coming from your incision.  You have a fever.  You have weakness or numbness in your legs that is new or getting worse.  You have swelling in your calf or leg.  The edges of your incision break open.  You vomit or feel nauseous.  You have trouble controlling urination or bowel movements. Get help right away if:  You develop shortness of breath or chest pain.  You have trouble swallowing.  You have trouble breathing.  You develop a cough. This information is not intended to replace advice given to you by your health care provider. Make sure you discuss any questions you have with your health care provider. Document Revised: 06/13/2017 Document Reviewed: 01/10/2016 Elsevier Patient Education  2020 [employer].

## 2020-02-23 NOTE — Progress Notes (Signed)
Subjective:  Patient ID: Caitlin Valdez, female    DOB: April 29, 1971  Age: 49 y.o. MRN: [id number]  CC: Hospitalization Follow-up   HPI Caitlin Valdez is a 49 year old female with a history of lumbar microdiscectomy in 09/2019, fibromyalgia C5-6 and C6-7 spinal stenosis status post C5-6 and C6-7 ACDF on 02/16/2020.  Scheduled for a complete physical but this was changed to a transition of care visit due to recent surgery. States she is miserable, her neck hurts. Her hands go numb. She sleeps in a recliner and is in pain.  She had hoped that after surgery her cervical radiculopathy would resolve. She sees her surgeon on 02/29/20. Cymbalta made her sick and she had to discontinue it. She has no home care; her mom came over shortly after surgery but then left. 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  Declines prescription of an antidepressant.  She would like STD test as the condom broke during intercourse 1 month ago.  Also requests a prescription for Fioricet which she received in the past for headaches. Past Medical History:  Diagnosis Date  . Abdominal pain, right lower quadrant   . Anxiety   . Arthritis   . Depression   . Fibromyalgia   . GERD (gastroesophageal reflux disease)   . Migraine   . Obesity   . Peripheral vascular disease (HCC)    RIGHT LEG  . PONV (postoperative nausea and vomiting)   . PTSD (post-traumatic stress disorder)   . Stroke [facility])    many years ago - no residual  . Trichomonas 2012  . Varicose veins of leg with swelling, right    "sometimes swelling."    Past Surgical History:  Procedure Laterality Date  . ANTERIOR CERVICAL DECOMP/DISCECTOMY FUSION N/A 02/16/2020   Procedure: CERVICAL FIVE THROUGH CERVICAL SIX, CERVICAL SIX  THROUGH CERVICAL SEVEN ANTERIOR CERVICAL DECOMPRESSION/DISCECTOMY FUSION, ALLOGRAFT, PLATE;  Surgeon: Marybelle Killings, MD;   Location: [place];  Service: Orthopedics;  Laterality: N/A;  . CESAREAN SECTION    . FRACTURE SURGERY    . HYSTEROSCOPY WITH NOVASURE N/A 08/08/2014   Procedure:  NOVASURE;  Surgeon: Emily Filbert, MD;  Location: [facility];  Service: Gynecology;  Laterality: N/A;  . LUMBAR LAMINECTOMY/DECOMPRESSION MICRODISCECTOMY N/A 09/27/2019   Procedure: RIGHT LUMBAR FIVE TO SACRAL ONE MICRODISCECTOMY;  Surgeon: Marybelle Killings, MD;  Location: [place];  Service: Orthopedics;  Laterality: N/A;  . VEIN SURGERY      Family History  Problem Relation Age of Onset  . Diabetes Mother   . Hypertension Mother   . Breast cancer Mother 37  . Diabetes Father   . Hypertension Father   . Diabetes Other   . Hypertension Other   . Other Neg Hx     No Known Allergies  Outpatient Medications Prior to Visit  Medication Sig Dispense Refill  . cetirizine (ZYRTEC) 10 MG tablet Take 1 tablet (10 mg total) by mouth at bedtime. To help with congestion (Patient taking differently: Take 10 mg by mouth daily as needed for allergies. ) 30 tablet 1  . DULoxetine (CYMBALTA) 60 MG capsule Take 1 capsule (60 mg total) by mouth daily. (Patient taking differently: Take 60 mg by mouth daily. Patient reports that she feels dizzy and unbalanced and could not sleep, jittery. Stopped taking.) 30 capsule 3  . gabapentin (NEURONTIN) 300 MG capsule  Take 2 capsules (600 mg total) by mouth 3 (three) times daily. 180 capsule 1  . methocarbamol (ROBAXIN) 500 MG tablet Take 1 tablet (500 mg total) by mouth every 6 (six) hours as needed for muscle spasms. 40 tablet 0  . Misc. Devices MISC Please provide patient with insurance approved custom compression socks for PVD. 1 each 0  . omeprazole (PRILOSEC) 40 MG capsule Take 1 capsule (40 mg total) by mouth daily. 30 capsule 3  . oxyCODONE-acetaminophen (PERCOCET/ROXICET) 5-325 MG tablet Take 1-2 tablets by mouth every 4 (four) hours as needed for severe pain. 50 tablet 0  . oxyCODONE-acetaminophen  (PERCOCET/ROXICET) 5-325 MG tablet Take 1 tablet by mouth every 4 (four) hours as needed for severe pain. 50 tablet 0  . polyethylene glycol powder (MIRALAX) 17 GM/SCOOP powder Mix 17 gm with 8 or more ounces of fluid and drink daily to help with constipation; 1 month supply (Patient taking differently: Take 17 g by mouth daily as needed for moderate constipation. ) 255 g prn  . vitamin B-12 (CYANOCOBALAMIN) 500 MCG tablet Take 1 tablet (500 mcg total) by mouth daily. 30 tablet 3  . Vitamin D, Ergocalciferol, (DRISDOL) 1.25 MG (50000 UNIT) CAPS capsule Take 1 capsule (50,000 Units total) by mouth every 7 (seven) days. 5 capsule 1   No facility-administered medications prior to visit.     ROS Review of Systems  Constitutional: Negative for activity change, appetite change and fatigue.  HENT: Negative for congestion, sinus pressure and sore throat.   Eyes: Negative for visual disturbance.  Respiratory: Negative for cough, chest tightness, shortness of breath and wheezing.   Cardiovascular: Negative for chest pain and palpitations.  Gastrointestinal: Negative for abdominal distention, abdominal pain and constipation.  Endocrine: Negative for polydipsia.  Genitourinary: Negative for dysuria and frequency.  Musculoskeletal:       See HPI  Skin: Negative for rash.  Neurological: Negative for tremors, light-headedness and numbness.  Hematological: Does not bruise/bleed easily.  Psychiatric/Behavioral: Negative for agitation and behavioral problems.    Objective:  BP 114/73   Pulse 97   Ht 5\' 3"  (1.6 m)   Wt 196 lb (88.9 kg)   SpO2 99%   BMI 34.72 kg/m   BP/Weight 02/23/2020 08/20/3333 10/18/6254  Systolic BP 389 373 -  Diastolic BP 73 75 -  Wt. (Lbs) 196 - 185  BMI 34.72 - 32.77      Physical Exam Constitutional:      Appearance: She is well-developed.  Neck:     Vascular: No JVD.     Comments: Soft neck collar in place Cardiovascular:     Rate and Rhythm: Normal rate.      Heart sounds: Normal heart sounds. No murmur heard.   Pulmonary:     Effort: Pulmonary effort is normal.     Breath sounds: Normal breath sounds. No wheezing or rales.  Chest:     Chest wall: No tenderness.  Abdominal:     General: Bowel sounds are normal. There is no distension.     Palpations: Abdomen is soft. There is no mass.     Tenderness: There is no abdominal tenderness.  Musculoskeletal:        General: Normal range of motion.     Cervical back: Rigidity present.     Right lower leg: No edema.     Left lower leg: No edema.     Comments: Tenderness to palpation of entire lumbar spine Normal handgrip bilaterally  Neurological:  Mental Status: She is alert and oriented to person, place, and time.  Psychiatric:        Mood and Affect: Mood normal.     CMP Latest Ref Rng & Units 02/01/2020 09/22/2019 04/29/2019  Glucose 65 - 99 mg/dL 89 90 87  BUN 6 - 24 mg/dL 6 5(L) 8  Creatinine 0.57 - 1.00 mg/dL 0.86 0.78 0.85  Sodium 134 - 144 mmol/L 139 137 138  Potassium 3.5 - 5.2 mmol/L 4.7 3.8 4.3  Chloride 96 - 106 mmol/L 102 107 104  CO2 20 - 29 mmol/L 21 23 24   Calcium 8.7 - 10.2 mg/dL 10.0 9.4 9.5  Total Protein 6.0 - 8.5 g/dL 6.9 7.2 7.1  Total Bilirubin 0.0 - 1.2 mg/dL 0.3 0.4 0.2  Alkaline Phos 48 - 121 IU/L 97 61 84  AST 0 - 40 IU/L 16 16 16   ALT 0 - 32 IU/L 13 17 19     Lipid Panel     Component Value Date/Time   CHOL 181 12/23/2013 1720   TRIG 103 12/23/2013 1720   HDL 75 12/23/2013 1720   CHOLHDL 2.4 12/23/2013 1720   VLDL 21 12/23/2013 1720   LDLCALC 85 12/23/2013 1720    CBC    Component Value Date/Time   WBC 10.0 02/01/2020 1614   WBC 7.8 09/22/2019 1048   RBC 4.81 02/01/2020 1614   RBC 4.31 09/22/2019 1048   HGB 15.3 02/01/2020 1614   HCT 44.8 02/01/2020 1614   PLT 262 02/01/2020 1614   MCV 93 02/01/2020 1614   MCH 31.8 02/01/2020 1614   MCH 32.5 09/22/2019 1048   MCHC 34.2 02/01/2020 1614   MCHC 33.0 09/22/2019 1048   RDW 12.2 02/01/2020  1614   LYMPHSABS 4.4 (H) 02/01/2020 1614   MONOABS 0.6 04/02/2018 2137   EOSABS 0.1 02/01/2020 1614   BASOSABS 0.0 02/01/2020 1614    Lab Results  Component Value Date   HGBA1C 5.4 03/18/2018    Assessment & Plan:  1. S/P lumbar microdiscectomy Uncontrolled Lidoderm patch added to regimen Unfortunately she is unable to tolerate Cymbalta and declines trial of a lower dose - lidocaine (LIDODERM) 5 %; Place 1 patch onto the skin daily. Remove & Discard patch within 12 hours or as directed by MD  Dispense: 30 patch; Refill: 1  2. S/P cervical spinal fusion See #1 above I will have the case manager meet with her to discuss possible options for PCS services - lidocaine (LIDODERM) 5 %; Place 1 patch onto the skin daily. Remove & Discard patch within 12 hours or as directed by MD  Dispense: 30 patch; Refill: 1  3. Nonintractable episodic headache, unspecified headache type - butalbital-acetaminophen-caffeine (FIORICET) 50-325-40 MG tablet; Take 1 tablet by mouth every 12 (twelve) hours as needed for headache.  Dispense: 30 tablet; Refill: 1  4. Screening for STD (sexually transmitted disease) - Cervicovaginal ancillary only  5. Fibromyalgia Currently on gabapentin and a muscle relaxant, unable to tolerate Cymbalta   Health Care Maintenance: We will address this at her next visit as her acute pain precludes [place] from proceeding with a complete physical exam.  Meds ordered this encounter  Medications  . lidocaine (LIDODERM) 5 %    Sig: Place 1 patch onto the skin daily. Remove & Discard patch within 12 hours or as directed by MD    Dispense:  30 patch    Refill:  1  . butalbital-acetaminophen-caffeine (FIORICET) 50-325-40 MG tablet    Sig: Take 1 tablet by  mouth every 12 (twelve) hours as needed for headache.    Dispense:  30 tablet    Refill:  1    Follow-up: Return in about 3 months (around 05/25/2020) for Complete physical exam.       Charlott Rakes, MD, FAAFP. [facility] [place] [place], [place]   02/23/2020, 3:59 PM

## 2020-02-23 NOTE — Telephone Encounter (Signed)
Met with the patient when she was in the clinic today.  She was interested in assistance with personal care as her functioning is limited due to her surgery.  This CM explained to the patient that it is unlikely that her insurance company would approve personal care assistance only.  The patient needs to have a need for skilled nursing and /or PT.  She stated that she understood and said that she is hoping that her mother will be able to come to stay with her.  Her mother had been with her and left when she got home from the hospital.

## 2020-02-24 NOTE — Discharge Summary (Signed)
Patient ID: Caitlin Valdez MRN: [id number] DOB/AGE: 48-May-1972 48 y.o.  Admit date: 02/16/2020 Discharge date: 02/17/2020 Admission Diagnoses:  Active Problems:   Spinal stenosis in cervical region   Cervical spinal stenosis   Discharge Diagnoses:  Active Problems:   Spinal stenosis in cervical region   Cervical spinal stenosis  status post Procedure(s): CERVICAL FIVE THROUGH CERVICAL SIX, CERVICAL SIX  THROUGH CERVICAL SEVEN ANTERIOR CERVICAL DECOMPRESSION/DISCECTOMY FUSION, ALLOGRAFT, PLATE  Past Medical History:  Diagnosis Date  . Abdominal pain, right lower quadrant   . Anxiety   . Arthritis   . Depression   . Fibromyalgia   . GERD (gastroesophageal reflux disease)   . Migraine   . Obesity   . Peripheral vascular disease (HCC)    RIGHT LEG  . PONV (postoperative nausea and vomiting)   . PTSD (post-traumatic stress disorder)   . Stroke [facility])    many years ago - no residual  . Trichomonas 2012  . Varicose veins of leg with swelling, right    "sometimes swelling."    Surgeries: Procedure(s): CERVICAL FIVE THROUGH CERVICAL SIX, CERVICAL SIX  THROUGH CERVICAL SEVEN ANTERIOR CERVICAL DECOMPRESSION/DISCECTOMY FUSION, ALLOGRAFT, PLATE on 7/0/2637   Consultants:   Discharged Condition: Improved  Hospital Course: Caitlin Valdez is an 49 y.o. female who was admitted 02/16/2020 for operative treatment of cervical stenosis. Patient failed conservative treatments (please see the history and physical for the specifics) and had severe unremitting pain that affects sleep, daily activities and work/hobbies. After pre-op clearance, the patient was taken to the operating room on 02/16/2020 and underwent  Procedure(s): [place], CERVICAL SIX  THROUGH CERVICAL SEVEN ANTERIOR CERVICAL DECOMPRESSION/DISCECTOMY FUSION, ALLOGRAFT, PLATE.    Patient was given perioperative antibiotics:  Anti-infectives (From admission, onward)   Start     Dose/Rate Route Frequency  Ordered Stop   02/16/20 1230  ceFAZolin (ANCEF) 2-4 GM/100ML-% IVPB       Note to Pharmacy: Tressia Miners Ch: cabinet override      02/16/20 1230 02/17/20 0044       Patient was given sequential compression devices and early ambulation to prevent DVT.   Patient benefited maximally from hospital stay and there were no complications. At the time of discharge, the patient was urinating/moving their bowels without difficulty, tolerating a regular diet, pain is controlled with oral pain medications and they have been cleared by PT/OT.   Recent vital signs: No data found.   Recent laboratory studies: No results for input(s): WBC, HGB, HCT, PLT, NA, K, CL, CO2, BUN, CREATININE, GLUCOSE, INR, CALCIUM in the last 72 hours.  Invalid input(s): PT, 2   Discharge Medications:   Allergies as of 02/17/2020   No Known Allergies     Medication List    TAKE these medications   cetirizine 10 MG tablet Commonly known as: ZYRTEC Take 1 tablet (10 mg total) by mouth at bedtime. To help with congestion What changed:   when to take this  reasons to take this  additional instructions   DULoxetine 60 MG capsule Commonly known as: Cymbalta Take 1 capsule (60 mg total) by mouth daily. What changed: additional instructions   gabapentin 300 MG capsule Commonly known as: NEURONTIN Take 2 capsules (600 mg total) by mouth 3 (three) times daily.   methocarbamol 500 MG tablet Commonly known as: Robaxin Take 1 tablet (500 mg total) by mouth every 6 (six) hours as needed for muscle spasms.   Misc. Devices Misc Please provide patient with  insurance approved custom compression socks for PVD.   omeprazole 40 MG capsule Commonly known as: PRILOSEC Take 1 capsule (40 mg total) by mouth daily.   oxyCODONE-acetaminophen 5-325 MG tablet Commonly known as: PERCOCET/ROXICET Take 1-2 tablets by mouth every 4 (four) hours as needed for severe pain. What changed: Another medication with the same name was  added. Make sure you understand how and when to take each.   oxyCODONE-acetaminophen 5-325 MG tablet Commonly known as: PERCOCET/ROXICET Take 1 tablet by mouth every 4 (four) hours as needed for severe pain. What changed: You were already taking a medication with the same name, and this prescription was added. Make sure you understand how and when to take each.   polyethylene glycol powder 17 GM/SCOOP powder Commonly known as: MiraLax Mix 17 gm with 8 or more ounces of fluid and drink daily to help with constipation; 1 month supply What changed:   how much to take  how to take this  when to take this  reasons to take this  additional instructions   vitamin B-12 500 MCG tablet Commonly known as: CYANOCOBALAMIN Take 1 tablet (500 mcg total) by mouth daily.   Vitamin D (Ergocalciferol) 1.25 MG (50000 UNIT) Caps capsule Commonly known as: DRISDOL Take 1 capsule (50,000 Units total) by mouth every 7 (seven) days.       Diagnostic Studies: DG Cervical Spine 2-3 Views  Result Date: 02/16/2020 CLINICAL DATA:  Cervical fusion EXAM: CERVICAL SPINE - 2-3 VIEW; DG C-ARM 1-60 MIN COMPARISON:  None. FLUOROSCOPY TIME:  Fluoroscopy Time:  25 seconds Radiation Exposure Index (if provided by the fluoroscopic device): 3.28 mGy Number of Acquired Spot Images: 2 FINDINGS: Changes consistent with C5-6 and C6-7 fusion with anterior fixation are noted. IMPRESSION: Cervical fusion from C5-C7. Electronically Signed   By: Inez Catalina M.D.   On: 02/16/2020 15:27   MR Cervical Spine w/o contrast  Result Date: 01/28/2020 CLINICAL DATA:  Initial evaluation for spondylosis at C5-6 and C6-7, progressive cervical stenosis. EXAM: MRI CERVICAL SPINE WITHOUT CONTRAST TECHNIQUE: Multiplanar, multisequence MR imaging of the cervical spine was performed. No intravenous contrast was administered. COMPARISON:  Prior MRI from 06/30/2013. FINDINGS: Alignment: Straightening with mild reversal of the normal cervical  lordosis. No listhesis. Vertebrae: Vertebral body height maintained without evidence for acute or chronic fracture. Bone marrow signal intensity within normal limits. No discrete or worrisome osseous lesions. Discogenic reactive endplate changes present about the C5-6 and C6-7 interspace, progressed from previous. No other abnormal marrow edema. Cord: Signal intensity within the cervical spinal cord is normal. Posterior Fossa, vertebral arteries, paraspinal tissues: Visualized brain and posterior fossa within normal limits. Craniocervical junction normal. Paraspinous and prevertebral soft tissues within normal limits. Normal intravascular flow voids seen within the vertebral arteries bilaterally. Disc levels: C2-C3: No significant disc bulge.  No canal or foraminal stenosis. C3-C4: Mild annular disc bulge, slightly eccentric to the left. No significant spinal stenosis. Superimposed left greater than right uncovertebral hypertrophy with resultant mild left C4 foraminal stenosis. No significant right foraminal narrowing. Appearance is mildly progressed from previous. C4-C5: Diffuse disc bulge with bilateral uncovertebral hypertrophy. Flattening and partial effacement of the ventral thecal sac with resultant mild spinal stenosis. Minimal flattening of the ventral spinal cord. Foramina remain patent. Appearance is similar to previous. C5-C6: Diffuse disc bulge with bilateral uncovertebral hypertrophy. Superimposed central to left subarticular disc protrusion with slight inferior migration (series 6, image 16). Disc material flattens and partially faces the ventral thecal sac and results in  mild to moderate spinal stenosis. Flattening of the ventral and left hemicord, slightly progressed from previous. Moderate bilateral C6 foraminal narrowing. C6-C7: Chronic diffuse degenerative disc osteophyte with intervertebral disc space narrowing. Broad posterior component flattens and partially faces the ventral thecal sac  resultant mild-to-moderate spinal stenosis. Minimal cord flattening. Moderate to severe left with moderate right C7 foraminal stenosis, progressed from previous. C7-T1: Minimal annular disc bulge. Mild facet hypertrophy. No canal or foraminal stenosis. Visualized upper thoracic spine demonstrates no significant finding. IMPRESSION: 1. Progressive degenerative spondylosis at C5-6 and C6-7 with resultant mild to moderate spinal stenosis. Moderate bilateral C6 foraminal stenosis, with moderate to severe left greater than right C7 foraminal narrowing. 2. Mild disc bulging with uncovertebral hypertrophy at C3-4 with resultant mild left C4 foraminal stenosis, mildly progressed from previous. 3. Disc bulge at C4-5 with resultant mild spinal stenosis, stable. Electronically Signed   By: Jeannine Boga M.D.   On: 01/28/2020 04:17   DG C-Arm 1-60 Min  Result Date: 02/16/2020 CLINICAL DATA:  Cervical fusion EXAM: CERVICAL SPINE - 2-3 VIEW; DG C-ARM 1-60 MIN COMPARISON:  None. FLUOROSCOPY TIME:  Fluoroscopy Time:  25 seconds Radiation Exposure Index (if provided by the fluoroscopic device): 3.28 mGy Number of Acquired Spot Images: 2 FINDINGS: Changes consistent with C5-6 and C6-7 fusion with anterior fixation are noted. IMPRESSION: Cervical fusion from C5-C7. Electronically Signed   By: Inez Catalina M.D.   On: 02/16/2020 15:27    Discharge Instructions    Incentive spirometry RT   Complete by: As directed        Follow-up Information    Schedule an appointment as soon as possible for a visit with Marybelle Killings, MD.   Specialty: Orthopedic Surgery Why: need return office visit one week postop Contact information: [place] [address] [phone]               Discharge Plan:  discharge to home  Disposition:     Signed: Benjiman Core for Rodell Perna MD 02/24/2020, 9:35 AM

## 2020-02-25 LAB — CERVICOVAGINAL ANCILLARY ONLY
Bacterial Vaginitis (gardnerella): POSITIVE — AB
Candida Glabrata: NEGATIVE
Candida Vaginitis: NEGATIVE
Chlamydia: NEGATIVE
Comment: NEGATIVE
Comment: NEGATIVE
Comment: NEGATIVE
Comment: NEGATIVE
Comment: NEGATIVE
Comment: NORMAL
Neisseria Gonorrhea: NEGATIVE
Trichomonas: NEGATIVE

## 2020-02-26 ENCOUNTER — Other Ambulatory Visit: Payer: Self-pay | Admitting: Family Medicine

## 2020-02-26 MED ORDER — METRONIDAZOLE 0.75 % VA GEL: 1.0000 | Freq: Every day | VAGINAL | 0 refills | Status: DC

## 2020-02-29 ENCOUNTER — Ambulatory Visit (INDEPENDENT_AMBULATORY_CARE_PROVIDER_SITE_OTHER): Payer: BC Managed Care – PPO | Admitting: Orthopaedic Surgery

## 2020-02-29 ENCOUNTER — Encounter: Payer: Self-pay | Admitting: Orthopaedic Surgery

## 2020-02-29 ENCOUNTER — Ambulatory Visit (INDEPENDENT_AMBULATORY_CARE_PROVIDER_SITE_OTHER): Payer: BC Managed Care – PPO

## 2020-02-29 VITALS — Ht 63.0 in | Wt 196.0 lb

## 2020-02-29 DIAGNOSIS — Z981 Arthrodesis status: Secondary | ICD-10-CM

## 2020-02-29 NOTE — Progress Notes (Signed)
Post-Op Visit Note   Patient: Caitlin Valdez           Date of Birth: 06-Jun-1971           MRN: [id number] Visit Date: 02/29/2020 PCP: Elsie Stain, MD   Assessment & Plan: Two-level cervical fusion incision was good patient had problems with reflux and was on reflux medication before surgery but states since the surgery after she eats she has been vomiting.  She does have trouble swallowing as long as something softer.  X-rays do not show is much prevertebral swelling is normal.  The anterior spurs were removed plate is in good position.  She has had problems with the collar causing a rash she has been putting ointment on it which is helped in states she is allergic to the foam in the collar and wants a different kind of collar.  Prescription given for [place] collar.  Chief Complaint:  Chief Complaint  Patient presents with  . Neck - Routine Post Op    02/16/2020 C5-6, C6-7 ACDF   Visit Diagnoses:  1. Status post cervical spinal fusion     Plan: X-rays look good.  Prescription for [place] collar.  Return 5 weeks for lateral flexion-extension C-spine x-ray.  She can talk with her PCP if she has persistent problems with GERD.  Follow-Up Instructions: No follow-ups on file.   Orders:  Orders Placed This Encounter  Procedures  . XR Cervical Spine 2 or 3 views   No orders of the defined types were placed in this encounter.   Imaging: No results found.  PMFS History: Patient Active Problem List   Diagnosis Date Noted  . Cervical spinal stenosis 02/16/2020  . Spondylosis without myelopathy or radiculopathy, cervical region 02/01/2020  . Gastroesophageal reflux disease without esophagitis 02/01/2020  . S/P lumbar microdiscectomy 01/04/2020  . Radiculopathy due to lumbar intervertebral disc disorder 06/22/2019  . Anxiety and depression 03/17/2018  . S/P endometrial ablation 06/27/2016  . Migraine without status migrainosus, not intractable 06/27/2016  . Vitamin D  insufficiency 02/11/2016  . De Quervain's tenosynovitis, left 02/09/2016  . Vitamin B12 deficiency 10/17/2015  . Allergic rhinitis 12/29/2014  . Current smoker 11/22/2014  . Obesity 06/29/2014  . Spinal stenosis in cervical region 02/22/2014  . Fibromyalgia 07/15/2009   Past Medical History:  Diagnosis Date  . Abdominal pain, right lower quadrant   . Anxiety   . Arthritis   . Depression   . Fibromyalgia   . GERD (gastroesophageal reflux disease)   . Migraine   . Obesity   . Peripheral vascular disease (HCC)    RIGHT LEG  . PONV (postoperative nausea and vomiting)   . PTSD (post-traumatic stress disorder)   . Stroke [facility])    many years ago - no residual  . Trichomonas 2012  . Varicose veins of leg with swelling, right    "sometimes swelling."    Family History  Problem Relation Age of Onset  . Diabetes Mother   . Hypertension Mother   . Breast cancer Mother 43  . Diabetes Father   . Hypertension Father   . Diabetes Other   . Hypertension Other   . Other Neg Hx     Past Surgical History:  Procedure Laterality Date  . ANTERIOR CERVICAL DECOMP/DISCECTOMY FUSION N/A 02/16/2020   Procedure: CERVICAL FIVE THROUGH CERVICAL SIX, CERVICAL SIX  THROUGH CERVICAL SEVEN ANTERIOR CERVICAL DECOMPRESSION/DISCECTOMY FUSION, ALLOGRAFT, PLATE;  Surgeon: Marybelle Killings, MD;  Location: [facility];  Service: Orthopedics;  Laterality: N/A;  . CESAREAN SECTION    . FRACTURE SURGERY    . HYSTEROSCOPY WITH NOVASURE N/A 08/08/2014   Procedure:  NOVASURE;  Surgeon: Emily Filbert, MD;  Location: [facility];  Service: Gynecology;  Laterality: N/A;  . LUMBAR LAMINECTOMY/DECOMPRESSION MICRODISCECTOMY N/A 09/27/2019   Procedure: RIGHT LUMBAR FIVE TO SACRAL ONE MICRODISCECTOMY;  Surgeon: Marybelle Killings, MD;  Location: [place];  Service: Orthopedics;  Laterality: N/A;  . VEIN SURGERY     Social History   Occupational History  . Not on file  Tobacco Use  . Smoking status: Current Every Day Smoker    Packs/day:  1.00    Years: 5.00    Pack years: 5.00    Types: Cigarettes  . Smokeless tobacco: Never Used  . Tobacco comment: trying to quit  Vaping Use  . Vaping Use: Never used  Substance and Sexual Activity  . Alcohol use: No  . Drug use: Yes    Types: Marijuana    Comment: ocassional use for pain - 02/12/20  . Sexual activity: Yes    Birth control/protection: Condom

## 2020-03-03 ENCOUNTER — Institutional Professional Consult (permissible substitution): Payer: BC Managed Care – PPO | Admitting: Licensed Clinical Social Worker

## 2020-03-03 ENCOUNTER — Telehealth: Payer: Self-pay | Admitting: Licensed Clinical Social Worker

## 2020-03-03 NOTE — Telephone Encounter (Signed)
Call placed to patient 2 x regarding scheduled [facility] appointment. LCSW left message requesting a return call.

## 2020-03-15 ENCOUNTER — Ambulatory Visit: Payer: Self-pay

## 2020-03-15 NOTE — Telephone Encounter (Addendum)
Pt son is visiting from [facility] and he is in her house and he has covid. She is requesting a call back as she has some questions. She hugged him yesterday when he got home, and dr just called to tell him he is positive. Please call back Spoke with pt. Son tested positive for COVID 19 today. Has mild symptoms. Pt. Will wait 5-7 days from exposure to be tested. Offered to make appointment for testing. States she has the information and will schedule. She currently has no symptoms. Instructed to call back as needed.Reviewed home safety. Reason for Disposition . COVID-19 Testing, questions about  Answer Assessment - Initial Assessment Questions 1. COVID-19 CLOSE CONTACT: "Who is the person with the confirmed or suspected COVID-19 infection that you were exposed to?"     Son 2. PLACE of CONTACT: "Where were you when you were exposed to COVID-19?" (e.g., home, school, medical waiting room; which city?)     Home 3. TYPE of CONTACT: "How much contact was there?" (e.g., sitting next to, live in same house, work in same office, same building)     Son is visiting 4. DURATION of CONTACT: "How long were you in contact with the COVID-19 patient?" (e.g., a few seconds, passed by person, a few minutes, 15 minutes or longer, live with the patient)     Hours 5. MASK: "Were you wearing a mask?" "Was the other person wearing a mask?" Note: wearing a mask reduces the risk of an otherwise close contact.     No 6. DATE of CONTACT: "When did you have contact with a COVID-19 patient?" (e.g., how many days ago)     Yesterday 7. COMMUNITY SPREAD: "Are there lots of cases of COVID-19 (community spread) where you live?" (See public health department website, if unsure)       Yes 8. SYMPTOMS: "Do you have any symptoms?" (e.g., fever, cough, breathing difficulty, loss of taste or smell)     No 9. PREGNANCY OR POSTPARTUM: "Is there any chance you are pregnant?" "When was your last menstrual period?" "Did you deliver in the last  2 weeks?"     No 10. HIGH RISK: "Do you have any heart or lung problems?" "Do you have a weak immune system?" (e.g., heart failure, COPD, asthma, HIV positive, chemotherapy, renal failure, diabetes mellitus, sickle cell anemia, obesity)       No 11. TRAVEL: "Have you traveled out of the country recently?" If Yes, ask: "When and where?" Also ask about out-of-state travel, since the CDC has identified some high-risk cities for community spread in the [place]. Note: Travel becomes less relevant if there is widespread community transmission where the patient lives.       No  Protocols used: CORONAVIRUS (COVID-19) EXPOSURE-A-AH

## 2020-03-24 ENCOUNTER — Telehealth: Payer: Self-pay | Admitting: Orthopaedic Surgery

## 2020-03-24 NOTE — Telephone Encounter (Signed)
Forms received from [place] to [employer].

## 2020-03-27 ENCOUNTER — Encounter: Payer: BC Managed Care – PPO | Admitting: Family Medicine

## 2020-03-30 ENCOUNTER — Ambulatory Visit: Payer: BC Managed Care – PPO | Admitting: Critical Care Medicine

## 2020-04-04 ENCOUNTER — Encounter: Payer: Self-pay | Admitting: Orthopaedic Surgery

## 2020-04-04 ENCOUNTER — Ambulatory Visit: Payer: Self-pay

## 2020-04-04 ENCOUNTER — Ambulatory Visit (INDEPENDENT_AMBULATORY_CARE_PROVIDER_SITE_OTHER): Payer: BC Managed Care – PPO | Admitting: Orthopaedic Surgery

## 2020-04-04 VITALS — Ht 63.0 in | Wt 196.0 lb

## 2020-04-04 DIAGNOSIS — M50323 Other cervical disc degeneration at C6-C7 level: Secondary | ICD-10-CM

## 2020-04-04 DIAGNOSIS — Z981 Arthrodesis status: Secondary | ICD-10-CM | POA: Diagnosis not present

## 2020-04-04 DIAGNOSIS — M50322 Other cervical disc degeneration at C5-C6 level: Secondary | ICD-10-CM

## 2020-04-04 NOTE — Progress Notes (Signed)
Post-Op Visit Note   Patient: Caitlin Valdez           Date of Birth: 1971-06-30           MRN: [id number] Visit Date: 04/04/2020 PCP: Elsie Stain, MD   Assessment & Plan: 5 weeks post cervical fusion. Flexion-extension shows no motion. Her dysphagia has improved. Recheck 2 weeks.  Chief Complaint:  Chief Complaint  Patient presents with  . Neck - Follow-up   Visit Diagnoses:  1. Status post cervical spinal fusion     Plan: Work slip given for work resumption 04/24/2020. She can remove her collar in 1 week.  Follow-Up Instructions: Return in about 2 weeks (around 04/18/2020).   Orders:  Orders Placed This Encounter  Procedures  . XR Cervical Spine 2 or 3 views   No orders of the defined types were placed in this encounter.   Imaging: No results found.  PMFS History: Patient Active Problem List   Diagnosis Date Noted  . Cervical spinal stenosis 02/16/2020  . Spondylosis without myelopathy or radiculopathy, cervical region 02/01/2020  . Gastroesophageal reflux disease without esophagitis 02/01/2020  . S/P lumbar microdiscectomy 01/04/2020  . Radiculopathy due to lumbar intervertebral disc disorder 06/22/2019  . Anxiety and depression 03/17/2018  . S/P endometrial ablation 06/27/2016  . Migraine without status migrainosus, not intractable 06/27/2016  . Vitamin D insufficiency 02/11/2016  . De Quervain's tenosynovitis, left 02/09/2016  . Vitamin B12 deficiency 10/17/2015  . Allergic rhinitis 12/29/2014  . Current smoker 11/22/2014  . Obesity 06/29/2014  . Spinal stenosis in cervical region 02/22/2014  . Fibromyalgia 07/15/2009   Past Medical History:  Diagnosis Date  . Abdominal pain, right lower quadrant   . Anxiety   . Arthritis   . Depression   . Fibromyalgia   . GERD (gastroesophageal reflux disease)   . Migraine   . Obesity   . Peripheral vascular disease (HCC)    RIGHT LEG  . PONV (postoperative nausea and vomiting)   . PTSD (post-traumatic  stress disorder)   . Stroke [facility])    many years ago - no residual  . Trichomonas 2012  . Varicose veins of leg with swelling, right    "sometimes swelling."    Family History  Problem Relation Age of Onset  . Diabetes Mother   . Hypertension Mother   . Breast cancer Mother 37  . Diabetes Father   . Hypertension Father   . Diabetes Other   . Hypertension Other   . Other Neg Hx     Past Surgical History:  Procedure Laterality Date  . ANTERIOR CERVICAL DECOMP/DISCECTOMY FUSION N/A 02/16/2020   Procedure: CERVICAL FIVE THROUGH CERVICAL SIX, CERVICAL SIX  THROUGH CERVICAL SEVEN ANTERIOR CERVICAL DECOMPRESSION/DISCECTOMY FUSION, ALLOGRAFT, PLATE;  Surgeon: Marybelle Killings, MD;  Location: [facility];  Service: Orthopedics;  Laterality: N/A;  . CESAREAN SECTION    . FRACTURE SURGERY    . HYSTEROSCOPY WITH NOVASURE N/A 08/08/2014   Procedure:  NOVASURE;  Surgeon: Emily Filbert, MD;  Location: [facility];  Service: Gynecology;  Laterality: N/A;  . LUMBAR LAMINECTOMY/DECOMPRESSION MICRODISCECTOMY N/A 09/27/2019   Procedure: RIGHT LUMBAR FIVE TO SACRAL ONE MICRODISCECTOMY;  Surgeon: Marybelle Killings, MD;  Location: [facility];  Service: Orthopedics;  Laterality: N/A;  . VEIN SURGERY     Social History   Occupational History  . Not on file  Tobacco Use  . Smoking status: Current Every Day Smoker    Packs/day: 1.00  Years: 5.00    Pack years: 5.00    Types: Cigarettes  . Smokeless tobacco: Never Used  . Tobacco comment: trying to quit  Vaping Use  . Vaping Use: Never used  Substance and Sexual Activity  . Alcohol use: No  . Drug use: Yes    Types: Marijuana    Comment: ocassional use for pain - 02/12/20  . Sexual activity: Yes    Birth control/protection: Condom

## 2020-04-10 ENCOUNTER — Ambulatory Visit (HOSPITAL_BASED_OUTPATIENT_CLINIC_OR_DEPARTMENT_OTHER): Payer: BC Managed Care – PPO | Admitting: Pharmacist

## 2020-04-10 ENCOUNTER — Other Ambulatory Visit: Payer: Self-pay

## 2020-04-10 ENCOUNTER — Ambulatory Visit: Payer: BC Managed Care – PPO | Attending: Critical Care Medicine | Admitting: Critical Care Medicine

## 2020-04-10 ENCOUNTER — Encounter: Payer: Self-pay | Admitting: Critical Care Medicine

## 2020-04-10 DIAGNOSIS — Z23 Encounter for immunization: Secondary | ICD-10-CM | POA: Diagnosis not present

## 2020-04-10 DIAGNOSIS — E559 Vitamin D deficiency, unspecified: Secondary | ICD-10-CM | POA: Diagnosis not present

## 2020-04-10 DIAGNOSIS — E538 Deficiency of other specified B group vitamins: Secondary | ICD-10-CM | POA: Diagnosis not present

## 2020-04-10 DIAGNOSIS — M47812 Spondylosis without myelopathy or radiculopathy, cervical region: Secondary | ICD-10-CM | POA: Diagnosis not present

## 2020-04-10 DIAGNOSIS — F32A Depression, unspecified: Secondary | ICD-10-CM

## 2020-04-10 DIAGNOSIS — F329 Major depressive disorder, single episode, unspecified: Secondary | ICD-10-CM

## 2020-04-10 DIAGNOSIS — F17200 Nicotine dependence, unspecified, uncomplicated: Secondary | ICD-10-CM | POA: Diagnosis not present

## 2020-04-10 DIAGNOSIS — F419 Anxiety disorder, unspecified: Secondary | ICD-10-CM

## 2020-04-10 MED ORDER — VITAMIN D (ERGOCALCIFEROL) 1.25 MG (50000 UNIT) PO CAPS: 50000.0000 [IU] | ORAL_CAPSULE | ORAL | 1 refills | Status: DC

## 2020-04-10 NOTE — Assessment & Plan Note (Signed)
Continued vitamin B12 supplementation

## 2020-04-10 NOTE — Assessment & Plan Note (Signed)
Ongoing anxiety depression

## 2020-04-10 NOTE — Patient Instructions (Addendum)
Refills on vitamin D sent to your [facility] pharmacy  A letter to extend you being out of work was issued  I will confer with Dr. Lorin Mercy regarding your pain management please keep your appointment with Dr. Lorin Mercy on October 5  Please keep your upcoming cardiology appointment  Flu vaccine was given  Continue to focus on smoking cessation  Return to see Dr. Joya Gaskins in 2 months  Influenza Virus Vaccine injection (Fluarix) What is this medicine? INFLUENZA VIRUS VACCINE (in floo EN zuh VAHY ruhs vak SEEN) helps to reduce the risk of getting influenza also known as the flu. This medicine may be used for other purposes; ask your health care provider or pharmacist if you have questions. COMMON BRAND NAME(S): Fluarix, Fluzone What should I tell my health care provider before I take this medicine? They need to know if you have any of these conditions:  bleeding disorder like hemophilia  fever or infection  Guillain-Barre syndrome or other neurological problems  immune system problems  infection with the human immunodeficiency virus (HIV) or AIDS  low blood platelet counts  multiple sclerosis  an unusual or allergic reaction to influenza virus vaccine, eggs, chicken proteins, latex, gentamicin, other medicines, foods, dyes or preservatives  pregnant or trying to get pregnant  breast-feeding How should I use this medicine? This vaccine is for injection into a muscle. It is given by a health care professional. A copy of Vaccine Information Statements will be given before each vaccination. Read this sheet carefully each time. The sheet may change frequently. Talk to your pediatrician regarding the use of this medicine in children. Special care may be needed. Overdosage: If you think you have taken too much of this medicine contact a poison control center or emergency room at once. NOTE: This medicine is only for you. Do not share this medicine with others. What if I miss a dose? This  does not apply. What may interact with this medicine?  chemotherapy or radiation therapy  medicines that lower your immune system like etanercept, anakinra, infliximab, and adalimumab  medicines that treat or prevent blood clots like warfarin  phenytoin  steroid medicines like prednisone or cortisone  theophylline  vaccines This list may not describe all possible interactions. Give your health care provider a list of all the medicines, herbs, non-prescription drugs, or dietary supplements you use. Also tell them if you smoke, drink alcohol, or use illegal drugs. Some items may interact with your medicine. What should I watch for while using this medicine? Report any side effects that do not go away within 3 days to your doctor or health care professional. Call your health care provider if any unusual symptoms occur within 6 weeks of receiving this vaccine. You may still catch the flu, but the illness is not usually as bad. You cannot get the flu from the vaccine. The vaccine will not protect against colds or other illnesses that may cause fever. The vaccine is needed every year. What side effects may I notice from receiving this medicine? Side effects that you should report to your doctor or health care professional as soon as possible:  allergic reactions like skin rash, itching or hives, swelling of the face, lips, or tongue Side effects that usually do not require medical attention (report to your doctor or health care professional if they continue or are bothersome):  fever  headache  muscle aches and pains  pain, tenderness, redness, or swelling at site where injected  weak or tired This list  may not describe all possible side effects. Call your doctor for medical advice about side effects. You may report side effects to FDA at 1-800-FDA-1088. Where should I keep my medicine? This vaccine is only given in a clinic, pharmacy, doctor's office, or other health care setting and  will not be stored at home. NOTE: This sheet is a summary. It may not cover all possible information. If you have questions about this medicine, talk to your doctor, pharmacist, or health care provider.  2020 Elsevier/Gold Standard (2008-01-27 09:30:40)

## 2020-04-10 NOTE — Assessment & Plan Note (Addendum)
Cervical spondylosis with status post decompression and fusion of the cervical spine C5-6 and 6 7 vertebra with discectomy  I told the patient that her symptom management may require a pain referral but not until Dr. Lorin Mercy clears her and she has an upcoming visit October 5.  I did refill her vitamin D at this time.  However I did not prescribe any opiates.  She does have residual medications for gabapentin and Robaxin available  The patient did not tolerate duloxetine in the past and cannot tolerate Lyrica.  She was seeking another opiate prescription but I told her to work through Dr. Lorin Mercy for now until he releases her.  I will also give her an extension on her work release to the end of October

## 2020-04-10 NOTE — Assessment & Plan Note (Signed)
Refill on vitamin D given

## 2020-04-10 NOTE — Progress Notes (Signed)
Patient presents for vaccination against influenz per orders of Dr. Joya Gaskins. Consent given. Counseling provided. No contraindications exists. Vaccine administered without incident.   Caitlin Valdez, PharmD, [place] [phone]

## 2020-04-10 NOTE — Progress Notes (Signed)
Subjective:    Patient ID: Caitlin Valdez, female    DOB: 01/24/1971, 49 y.o.   MRN: [id number]  02/01/20 49 y.o.F PCP is [facility].   Needs medical clearance for Cervical surgery C 5-6 and C 6-7 laminectomy/fusion per Dr Lorin Mercy  [facility] of fibromyalgia, migraine.  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  The patient's process has progressed to that of severe pain.  The patient has decreased functionality with this.  The patient is here today for medical and cardiac clearance for planned cervical spine surgery.  Patient does have reflux and takes proton pump inhibitors.  Patient had previous diagnosis of vitamin B 12 and vitamin D deficiency and this needs to be evaluated again as well.  The patient is out of her pain medicine out of gabapentin at this time.  The patient has quite a bit of depression related to recent loss of family members the last 15 months  There is no history of diabetes or hypertension   04/10/2020 This patient is seen in return follow-up and is status post cervical spine surgery C5-6 and 6 7 with anterior laminectomy and cervical fusions with discectomy per Dr. Lorin Mercy.  She still is complaining of significant dysesthesias and paresthesias and pain in the arms and weakness in the legs.  She had prior lumbar surgery earlier in the year.  She states only opiates seem to help.  She is off all opiates at this time.  She does use Robaxin as needed.  She has side effects to many of the other medicines we have tried in the past.  She states gabapentin which she is currently taking only minimally improves her situation.  She is not been recommended physical therapy as of yet.  Patient has overlying severe  anxiety and depression as well.  The patient was asking me for any other symptom management.  She has upcoming appointment with Dr. Lorin Mercy October 5. Note this patient does have a low vitamin D level and is out of her vitamin D supplements and will need refills.  She is still smoking but to less degree.  Her BMI is still elevated at 34 and she knows weight loss is paramount to improve her spinal conditions.  She wants to have a extension for another few weeks for work.  She works as a Scientist, water quality at [employer].  Past Medical History:  Diagnosis Date  . Abdominal pain, right lower quadrant   . Anxiety   . Arthritis   . Depression   . Fibromyalgia   . GERD (gastroesophageal reflux disease)   . Migraine   . Obesity   . Peripheral vascular disease (HCC)    RIGHT LEG  . PONV (postoperative nausea and vomiting)   . PTSD (post-traumatic stress disorder)   . Stroke [facility])    many years ago - no residual  . Trichomonas 2012  . Varicose veins of leg with swelling, right    "sometimes swelling."     Family History  Problem Relation Age of Onset  . Diabetes Mother   . Hypertension Mother   . Breast cancer Mother 108  . Diabetes Father   . Hypertension Father   . Diabetes Other   . Hypertension Other   .  Other Neg Hx      Social History   Socioeconomic History  . Marital status: Single    Spouse name: Not on file  . Number of children: Not on file  . Years of education: Not on file  . Highest education level: Not on file  Occupational History  . Not on file  Tobacco Use  . Smoking status: Current Every Day Smoker    Packs/day: 1.00    Years: 5.00    Pack years: 5.00    Types: Cigarettes  . Smokeless tobacco: Never Used  . Tobacco comment: trying to quit  Vaping Use  . Vaping Use: Never used  Substance and Sexual Activity  . Alcohol use: No  . Drug use: Yes    Types: Marijuana    Comment: ocassional use for pain - 02/12/20  . Sexual activity: Yes    Birth control/protection:  Condom  Other Topics Concern  . Not on file  Social History Narrative  . Not on file   Social Determinants of Health   Financial Resource Strain:   . Difficulty of Paying Living Expenses: Not on file  Food Insecurity:   . Worried About Charity fundraiser in the Last Year: Not on file  . Ran Out of Food in the Last Year: Not on file  Transportation Needs:   . Lack of Transportation (Medical): Not on file  . Lack of Transportation (Non-Medical): Not on file  Physical Activity:   . Days of Exercise per Week: Not on file  . Minutes of Exercise per Session: Not on file  Stress:   . Feeling of Stress : Not on file  Social Connections:   . Frequency of Communication with Friends and Family: Not on file  . Frequency of Social Gatherings with Friends and Family: Not on file  . Attends Religious Services: Not on file  . Active Member of Clubs or Organizations: Not on file  . Attends Archivist Meetings: Not on file  . Marital Status: Not on file  Intimate Partner Violence:   . Fear of Current or Ex-Partner: Not on file  . Emotionally Abused: Not on file  . Physically Abused: Not on file  . Sexually Abused: Not on file     No Known Allergies   Outpatient Medications Prior to Visit  Medication Sig Dispense Refill  . cetirizine (ZYRTEC) 10 MG tablet Take 1 tablet (10 mg total) by mouth at bedtime. To help with congestion (Patient taking differently: Take 10 mg by mouth daily as needed for allergies. ) 30 tablet 1  . metroNIDAZOLE (METROGEL VAGINAL) 0.75 % vaginal gel Place 1 Applicatorful vaginally at bedtime. 70 g 0  . Misc. Devices MISC Please provide patient with insurance approved custom compression socks for PVD. 1 each 0  . omeprazole (PRILOSEC) 40 MG capsule Take 1 capsule (40 mg total) by mouth daily. 30 capsule 3  . polyethylene glycol powder (MIRALAX) 17 GM/SCOOP powder Mix 17 gm with 8 or more ounces of fluid and drink daily to help with constipation; 1 month  supply (Patient taking differently: Take 17 g by mouth daily as needed for moderate constipation. ) 255 g prn  . vitamin B-12 (CYANOCOBALAMIN) 500 MCG tablet Take 1 tablet (500 mcg total) by mouth daily. 30 tablet 3  . Vitamin D, Ergocalciferol, (DRISDOL) 1.25 MG (50000 UNIT) CAPS capsule Take 1 capsule (50,000 Units total) by mouth every 7 (seven) days. 5 capsule 1  . gabapentin (NEURONTIN) 300 MG  capsule Take 2 capsules (600 mg total) by mouth 3 (three) times daily. 180 capsule 1  . butalbital-acetaminophen-caffeine (FIORICET) 50-325-40 MG tablet Take 1 tablet by mouth every 12 (twelve) hours as needed for headache. (Patient not taking: Reported on 04/10/2020) 30 tablet 1  . DULoxetine (CYMBALTA) 60 MG capsule Take 1 capsule (60 mg total) by mouth daily. (Patient not taking: Reported on 04/10/2020) 30 capsule 3  . lidocaine (LIDODERM) 5 % Place 1 patch onto the skin daily. Remove & Discard patch within 12 hours or as directed by MD (Patient not taking: Reported on 04/10/2020) 30 patch 1  . methocarbamol (ROBAXIN) 500 MG tablet Take 1 tablet (500 mg total) by mouth every 6 (six) hours as needed for muscle spasms. (Patient not taking: Reported on 04/10/2020) 40 tablet 0  . oxyCODONE-acetaminophen (PERCOCET/ROXICET) 5-325 MG tablet Take 1-2 tablets by mouth every 4 (four) hours as needed for severe pain. (Patient not taking: Reported on 04/10/2020) 50 tablet 0  . oxyCODONE-acetaminophen (PERCOCET/ROXICET) 5-325 MG tablet Take 1 tablet by mouth every 4 (four) hours as needed for severe pain. (Patient not taking: Reported on 04/10/2020) 50 tablet 0   No facility-administered medications prior to visit.       Review of Systems  Constitutional: Positive for activity change.  HENT: Negative.   Respiratory: Negative.   Cardiovascular: Negative.   Gastrointestinal: Negative.   Endocrine: Negative.   Musculoskeletal: Positive for back pain and gait problem.  Neurological: Positive for weakness. Negative  for dizziness, facial asymmetry, light-headedness and headaches.  Hematological: Negative.   Psychiatric/Behavioral: The patient is nervous/anxious.        Objective:   Physical Exam Vitals:   04/10/20 0940  BP: 117/76  Pulse: 97  Resp: 16  Temp: 98.6 F (37 C)  SpO2: 96%  Weight: 192 lb 13.9 oz (87.5 kg)    Gen: Pleasant, well-nourished, in no distress,  depressed affect  ENT: No lesions,  mouth clear,  oropharynx clear, no postnasal drip  Neck: No JVD, no TMG, no carotid bruits  Lungs: No use of accessory muscles, no dullness to percussion, clear without rales or rhonchi  Cardiovascular: RRR, heart sounds normal, no murmur or gallops, no peripheral edema  Abdomen: soft mild epigastric tenderness no rebound or guarding no HSM,  BS normal  Musculoskeletal: No deformities, no cyanosis or clubbing  Neuro: alert, non focal  Skin: Warm, no lesions or rashes          Assessment & Plan:  I personally reviewed all images and lab data in the [facility] system as well as any outside material available during this office visit and agree with the  radiology impressions.   Spondylosis without myelopathy or radiculopathy, cervical region Cervical spondylosis with status post decompression and fusion of the cervical spine C5-6 and 6 7 vertebra with discectomy  I told the patient that her symptom management may require a pain referral but not until Dr. Lorin Mercy clears her and she has an upcoming visit October 5.  I did refill her vitamin D at this time.  However I did not prescribe any opiates.  She does have residual medications for gabapentin and Robaxin available  The patient did not tolerate duloxetine in the past and cannot tolerate Lyrica.  She was seeking another opiate prescription but I told her to work through Dr. Lorin Mercy for now until he releases her.  I will also give her an extension on her work release to the end of October  Anxiety and depression  Ongoing anxiety depression    Vitamin D insufficiency Refill on vitamin D given  Vitamin B12 deficiency Continued vitamin B12 supplementation   Shundra was seen today for follow-up.  Diagnoses and all orders for this visit:  Spondylosis without myelopathy or radiculopathy, cervical region  Anxiety and depression  Vitamin D insufficiency  Vitamin B12 deficiency  Other orders -     Vitamin D, Ergocalciferol, (DRISDOL) 1.25 MG (50000 UNIT) CAPS capsule; Take 1 capsule (50,000 Units total) by mouth every 7 (seven) days.

## 2020-04-11 ENCOUNTER — Other Ambulatory Visit: Payer: Self-pay | Admitting: Orthopaedic Surgery

## 2020-04-16 NOTE — Progress Notes (Signed)
Cardiology Office Note:    Date:  04/18/2020   ID:  [name], DOB 09-27-1970, MRN [id number]  PCP:  Elsie Stain, MD  Cardiologist:  No primary care provider on file.  Electrophysiologist:  None   Referring MD: Elsie Stain, MD   Chief Complaint  Patient presents with  . Chest Pain   History of Present Illness:    ATIYAH BAUER is a 49 y.o. female with a hx of fibromyalgia, anxiety/depression, GERD, PVD, CVA who presents for follow-up.  She was referred by Dr. Chapman Fitch for preop evaluation prior to spinal surgery.  She underwent spinal surgery on 09/27/2019, and again on 02/12/1030, without complication.  She reports that she thinks she had a stroke 15 to 20 years ago, but does not remember the details.  States that she had symptoms of numbness.  She does not recall having any imaging done at that time.  She did have a brain MRI in 2014, which showed no abnormalities.  Reported history of PAD.  Since last clinic visit, she reports that she has been having chest pain.  States it has been occurring over the last month.  Reports sharp pain on left-side of chest, occurs about 2-3 times per week.  Typically last for couple minutes and resolves.  Has not noted what brings on the pain, can occur at rest.  Does report she went for a 6 mile walk last week, and while had no pain while walking, did report she noticed the pain in her chest afterwards.  Does report she has been having pain in her legs with walking.  Denies any shortness of breath.  Reports intermittent lightheadedness.  No lower extremity edema.  She continues to smoke 1 pack/day.   Past Medical History:  Diagnosis Date  . Abdominal pain, right lower quadrant   . Anxiety   . Arthritis   . Depression   . Fibromyalgia   . GERD (gastroesophageal reflux disease)   . Migraine   . Obesity   . Peripheral vascular disease (HCC)    RIGHT LEG  . PONV (postoperative nausea and vomiting)   . PTSD (post-traumatic stress disorder)    . Stroke [facility])    many years ago - no residual  . Trichomonas 2012  . Varicose veins of leg with swelling, right    "sometimes swelling."    Past Surgical History:  Procedure Laterality Date  . ANTERIOR CERVICAL DECOMP/DISCECTOMY FUSION N/A 02/16/2020   Procedure: CERVICAL FIVE THROUGH CERVICAL SIX, CERVICAL SIX  THROUGH CERVICAL SEVEN ANTERIOR CERVICAL DECOMPRESSION/DISCECTOMY FUSION, ALLOGRAFT, PLATE;  Surgeon: Marybelle Killings, MD;  Location: [facility];  Service: Orthopedics;  Laterality: N/A;  . CESAREAN SECTION    . FRACTURE SURGERY    . HYSTEROSCOPY WITH NOVASURE N/A 08/08/2014   Procedure:  NOVASURE;  Surgeon: Emily Filbert, MD;  Location: [facility];  Service: Gynecology;  Laterality: N/A;  . LUMBAR LAMINECTOMY/DECOMPRESSION MICRODISCECTOMY N/A 09/27/2019   Procedure: RIGHT LUMBAR FIVE TO SACRAL ONE MICRODISCECTOMY;  Surgeon: Marybelle Killings, MD;  Location: [facility];  Service: Orthopedics;  Laterality: N/A;  . VEIN SURGERY      Current Medications: Current Meds  Medication Sig  . cetirizine (ZYRTEC) 10 MG tablet Take 1 tablet (10 mg total) by mouth at bedtime. To help with congestion (Patient taking differently: Take 10 mg by mouth daily as needed for allergies. )  . gabapentin (NEURONTIN) 300 MG capsule Take 2 capsules (600 mg total) by mouth 3 (three) times  daily.  . metroNIDAZOLE (METROGEL VAGINAL) 0.75 % vaginal gel Place 1 Applicatorful vaginally at bedtime.  . Misc. Devices MISC Please provide patient with insurance approved custom compression socks for PVD.  [name] omeprazole (PRILOSEC) 40 MG capsule Take 1 capsule (40 mg total) by mouth daily.  . polyethylene glycol powder (MIRALAX) 17 GM/SCOOP powder Mix 17 gm with 8 or more ounces of fluid and drink daily to help with constipation; 1 month supply (Patient taking differently: Take 17 g by mouth daily as needed for moderate constipation. )  . vitamin B-12 (CYANOCOBALAMIN) 500 MCG tablet Take 1 tablet (500 mcg total) by mouth daily.  . Vitamin D,  Ergocalciferol, (DRISDOL) 1.25 MG (50000 UNIT) CAPS capsule Take 1 capsule (50,000 Units total) by mouth every 7 (seven) days.     Allergies:   Patient has no known allergies.   Social History   Socioeconomic History  . Marital status: Single    Spouse name: Not on file  . Number of children: Not on file  . Years of education: Not on file  . Highest education level: Not on file  Occupational History  . Not on file  Tobacco Use  . Smoking status: Current Every Day Smoker    Packs/day: 1.00    Years: 5.00    Pack years: 5.00    Types: Cigarettes  . Smokeless tobacco: Never Used  . Tobacco comment: trying to quit  Vaping Use  . Vaping Use: Never used  Substance and Sexual Activity  . Alcohol use: No  . Drug use: Yes    Types: Marijuana    Comment: ocassional use for pain - 02/12/20  . Sexual activity: Yes    Birth control/protection: Condom  Other Topics Concern  . Not on file  Social History Narrative  . Not on file   Social Determinants of Health   Financial Resource Strain:   . Difficulty of Paying Living Expenses: Not on file  Food Insecurity:   . Worried About Charity fundraiser in the Last Year: Not on file  . Ran Out of Food in the Last Year: Not on file  Transportation Needs:   . Lack of Transportation (Medical): Not on file  . Lack of Transportation (Non-Medical): Not on file  Physical Activity:   . Days of Exercise per Week: Not on file  . Minutes of Exercise per Session: Not on file  Stress:   . Feeling of Stress : Not on file  Social Connections:   . Frequency of Communication with Friends and Family: Not on file  . Frequency of Social Gatherings with Friends and Family: Not on file  . Attends Religious Services: Not on file  . Active Member of Clubs or Organizations: Not on file  . Attends Archivist Meetings: Not on file  . Marital Status: Not on file     Family History: The patient's family history includes Breast cancer (age of  onset: 77) in her mother; Diabetes in her father, mother, and another family member; Hypertension in her father, mother, and another family member. There is no history of Other.  ROS:   Please see the history of present illness.     All other systems reviewed and are negative.  EKGs/Labs/Other Studies Reviewed:    The following studies were reviewed today:   EKG:  EKG is  ordered today.  The ekg ordered today demonstrates normal sinus rhythm, rate 72, no ST/T abnormalities  Recent Labs: 04/29/2019: TSH 0.780 02/01/2020: ALT  13; BUN 6; Creatinine, Ser 0.86; Hemoglobin 15.3; Platelets 262; Potassium 4.7; Sodium 139  Recent Lipid Panel    Component Value Date/Time   CHOL 181 12/23/2013 1720   TRIG 103 12/23/2013 1720   HDL 75 12/23/2013 1720   CHOLHDL 2.4 12/23/2013 1720   VLDL 21 12/23/2013 1720   LDLCALC 85 12/23/2013 1720    Physical Exam:    VS:  BP 104/84   Pulse 75   Ht 5' 3"  (1.6 m)   Wt 199 lb (90.3 kg)   SpO2 99%   BMI 35.25 kg/m     Wt Readings from Last 3 Encounters:  04/18/20 199 lb (90.3 kg)  04/10/20 192 lb 13.9 oz (87.5 kg)  04/04/20 196 lb (88.9 kg)     GEN:  Well nourished, well developed in no acute distress HEENT: Normal NECK: No JVD LYMPHATICS: No lymphadenopathy CARDIAC: RRR, no murmurs, rubs, gallops RESPIRATORY:  Clear to auscultation without rales, wheezing or rhonchi  ABDOMEN: Soft, non-tender, non-distended MUSCULOSKELETAL:  No edema; No deformity  SKIN: Warm and dry NEUROLOGIC:  Alert and oriented x 3 PSYCHIATRIC:  Normal affect   ASSESSMENT:    1. Chest pain of uncertain etiology   2. Leg pain, bilateral   3. PVD (peripheral vascular disease) ([place])   4. Tobacco use    PLAN:    Chest pain: Atypical in description, but does have CAD risk factors (tobacco use, reported PAD) and warrants further evaluation for obstructive CAD -Coronary CT.  Will give 50 mg metoprolol prior to exam -Echocardiogram   PAD: reported history, no ABIs  on record.  Does report pain in legs with walking, will check ABIs  Tobacco use: Patient counseled on the risks of tobacco use and cessation strongly encouraged.  Will ask care guide to work with patient to assist with smoking cessation  RTC in 6 months  Medication Adjustments/Labs and Tests Ordered: Current medicines are reviewed at length with the patient today.  Concerns regarding medicines are outlined above.  Orders Placed This Encounter  Procedures  . CT CORONARY MORPH W/CTA COR W/SCORE W/CA W/CM &/OR WO/CM  . CT CORONARY FRACTIONAL FLOW RESERVE DATA PREP  . CT CORONARY FRACTIONAL FLOW RESERVE FLUID ANALYSIS  . EKG 12-Lead  . ECHOCARDIOGRAM COMPLETE  . VAS [place] ABI WITH/WO TBI  . VAS [place] LOWER EXTREMITY ARTERIAL DUPLEX   Meds ordered this encounter  Medications  . metoprolol tartrate (LOPRESSOR) 50 MG tablet    Sig: Take 50 mg (1 tablet) TWO hours prior to CT    Dispense:  1 tablet    Refill:  0    Patient Instructions  Medication Instructions:  Your physician recommends that you continue on your current medications as directed. Please refer to the Current Medication list given to you today.  *If you need a refill on your cardiac medications before your next appointment, please call your pharmacy*  Testing/Procedures: Your physician has requested that you have an ankle brachial index (ABI). During this test an ultrasound and blood pressure cuff are used to evaluate the arteries that supply the arms and legs with blood. Allow thirty minutes for this exam. There are no restrictions or special instructions.  Your physician has requested that you have an echocardiogram. Echocardiography is a painless test that uses sound waves to create images of your heart. It provides your doctor with information about the size and shape of your heart and how well your heart's chambers and valves are working. This procedure takes approximately  one hour. There are no restrictions for this  procedure. This will be done at our [facility] location:  [address]  Coronary CTA-see instructions below  Follow-Up: At [facility], you and your health needs are our priority.  As part of our continuing mission to provide you with exceptional heart care, we have created designated Provider Care Teams.  These Care Teams include your primary Cardiologist (physician) and Advanced Practice Providers (APPs -  Physician Assistants and Nurse Practitioners) who all work together to provide you with the care you need, when you need it.  We recommend signing up for the patient portal called "MyChart".  Sign up information is provided on this After Visit Summary.  MyChart is used to connect with patients for Virtual Visits (Telemedicine).  Patients are able to view lab/test results, encounter notes, upcoming appointments, etc.  Non-urgent messages can be sent to your provider as well.   To learn more about what you can do with MyChart, go to [email or web address].    Your next appointment:   3 month(s)  The format for your next appointment:   In Person  Provider:   Oswaldo Milian, MD   Other Instructions You have been referred to Amy our careguide to assist with smoking cessation    Your cardiac CT will be scheduled at one of the below locations:   [facility] [address] [place], [place] [address] [phone]  [place] [address] [place], [place] [address] [phone]  If scheduled at [facility], please arrive at the [facility] main entrance of [facility] 30 minutes prior to test start time. Proceed to the [facility] Radiology Department (first floor) to check-in and test prep.  If scheduled at [facility], please arrive 15 mins early for check-in and test prep.  Please follow these instructions carefully (unless otherwise  directed):  On the Night Before the Test: . Be sure to Drink plenty of water. . Do not consume any caffeinated/decaffeinated beverages or chocolate 12 hours prior to your test. . Do not take any antihistamines 12 hours prior to your test.  On the Day of the Test: . Drink plenty of water. Do not drink any water within one hour of the test. . Do not eat any food 4 hours prior to the test. . You may take your regular medications prior to the test.  . Take metoprolol (Lopressor) two hours prior to test. . FEMALES- please wear underwire-free bra if available      After the Test: . Drink plenty of water. . After receiving IV contrast, you may experience a mild flushed feeling. This is normal. . On occasion, you may experience a mild rash up to 24 hours after the test. This is not dangerous. If this occurs, you can take Benadryl 25 mg and increase your fluid intake. . If you experience trouble breathing, this can be serious. If it is severe call 911 IMMEDIATELY. If it is mild, please call our office. . If you take any of these medications: Glipizide/Metformin, Avandament, Glucavance, please do not take 48 hours after completing test unless otherwise instructed.   Once we have confirmed authorization from your insurance company, we will call you to set up a date and time for your test. Based on how quickly your insurance processes prior authorizations requests, please allow up to 4 weeks to be contacted for scheduling your Cardiac CT  appointment. Be advised that routine Cardiac CT appointments could be scheduled as many as 8 weeks after your provider has ordered it.  For non-scheduling related questions, please contact the cardiac imaging nurse navigator should you have any questions/concerns: Marchia Bond, Cardiac Imaging Nurse Navigator Burley Saver, Interim Cardiac Imaging Nurse [place] [facility] Direct Office Dial: [phone]   For scheduling needs, including  cancellations and rescheduling, please call Vivien Rota at [phone], option 3.       Signed, Donato Heinz, MD  04/18/2020 10:02 AM    [place]

## 2020-04-18 ENCOUNTER — Other Ambulatory Visit: Payer: Self-pay

## 2020-04-18 ENCOUNTER — Encounter: Payer: Self-pay | Admitting: Cardiology

## 2020-04-18 ENCOUNTER — Ambulatory Visit: Payer: BC Managed Care – PPO | Admitting: Orthopaedic Surgery

## 2020-04-18 ENCOUNTER — Ambulatory Visit (INDEPENDENT_AMBULATORY_CARE_PROVIDER_SITE_OTHER): Payer: BC Managed Care – PPO | Admitting: Cardiology

## 2020-04-18 VITALS — BP 104/84 | HR 75 | Ht 63.0 in | Wt 199.0 lb

## 2020-04-18 DIAGNOSIS — Z720 Tobacco use: Secondary | ICD-10-CM

## 2020-04-18 DIAGNOSIS — I739 Peripheral vascular disease, unspecified: Secondary | ICD-10-CM

## 2020-04-18 DIAGNOSIS — R079 Chest pain, unspecified: Secondary | ICD-10-CM | POA: Diagnosis not present

## 2020-04-18 DIAGNOSIS — M79605 Pain in left leg: Secondary | ICD-10-CM

## 2020-04-18 DIAGNOSIS — M79604 Pain in right leg: Secondary | ICD-10-CM

## 2020-04-18 MED ORDER — METOPROLOL TARTRATE 50 MG PO TABS
ORAL_TABLET | ORAL | 0 refills | Status: DC
Start: 2020-04-18 — End: 2020-05-17

## 2020-04-18 NOTE — Patient Instructions (Addendum)
Medication Instructions:  Your physician recommends that you continue on your current medications as directed. Please refer to the Current Medication list given to you today.  *If you need a refill on your cardiac medications before your next appointment, please call your pharmacy*  Testing/Procedures: Your physician has requested that you have an ankle brachial index (ABI). During this test an ultrasound and blood pressure cuff are used to evaluate the arteries that supply the arms and legs with blood. Allow thirty minutes for this exam. There are no restrictions or special instructions.  Your physician has requested that you have an echocardiogram. Echocardiography is a painless test that uses sound waves to create images of your heart. It provides your doctor with information about the size and shape of your heart and how well your heart's chambers and valves are working. This procedure takes approximately one hour. There are no restrictions for this procedure. This will be done at our [place] location:  [address]  Coronary CTA-see instructions below  Follow-Up: At [facility], you and your health needs are our priority.  As part of our continuing mission to provide you with exceptional heart care, we have created designated Provider Care Teams.  These Care Teams include your primary Cardiologist (physician) and Advanced Practice Providers (APPs -  Physician Assistants and Nurse Practitioners) who all work together to provide you with the care you need, when you need it.  We recommend signing up for the patient portal called "MyChart".  Sign up information is provided on this After Visit Summary.  MyChart is used to connect with patients for Virtual Visits (Telemedicine).  Patients are able to view lab/test results, encounter notes, upcoming appointments, etc.  Non-urgent messages can be sent to your provider as well.   To learn more about what you can do with  MyChart, go to [email or web address].    Your next appointment:   3 month(s)  The format for your next appointment:   In Person  Provider:   Oswaldo Milian, MD   Other Instructions You have been referred to Amy our careguide to assist with smoking cessation    Your cardiac CT will be scheduled at one of the below locations:   [facility] [address] [place], [place] [address] [phone]  [facility] [address] [place], [place] [address] [phone]  If scheduled at [facility], please arrive at the [facility] main entrance of [facility] 30 minutes prior to test start time. Proceed to the [facility] Radiology Department (first floor) to check-in and test prep.  If scheduled at [facility], please arrive 15 mins early for check-in and test prep.  Please follow these instructions carefully (unless otherwise directed):  On the Night Before the Test: . Be sure to Drink plenty of water. . Do not consume any caffeinated/decaffeinated beverages or chocolate 12 hours prior to your test. . Do not take any antihistamines 12 hours prior to your test.  On the Day of the Test: . Drink plenty of water. Do not drink any water within one hour of the test. . Do not eat any food 4 hours prior to the test. . You may take your regular medications prior to the test.  . Take metoprolol (Lopressor) two hours prior to test. . FEMALES- please wear underwire-free bra if available      After the Test: . Drink plenty of water. [name]  After receiving IV contrast, you may experience a mild flushed feeling. This is normal. . On occasion, you may experience a mild rash up to 24 hours after the test. This is not dangerous. If this occurs, you can take Benadryl 25 mg and increase your fluid intake. . If you experience trouble breathing, this can be serious. If it is severe call  911 IMMEDIATELY. If it is mild, please call our office. . If you take any of these medications: Glipizide/Metformin, Avandament, Glucavance, please do not take 48 hours after completing test unless otherwise instructed.   Once we have confirmed authorization from your insurance company, we will call you to set up a date and time for your test. Based on how quickly your insurance processes prior authorizations requests, please allow up to 4 weeks to be contacted for scheduling your Cardiac CT appointment. Be advised that routine Cardiac CT appointments could be scheduled as many as 8 weeks after your provider has ordered it.  For non-scheduling related questions, please contact the cardiac imaging nurse navigator should you have any questions/concerns: Marchia Bond, Cardiac Imaging Nurse Navigator Burley Saver, Interim Cardiac Imaging Nurse [facility] Direct Office Dial: [phone]   For scheduling needs, including cancellations and rescheduling, please call Vivien Rota at [phone], option 3.

## 2020-04-19 ENCOUNTER — Ambulatory Visit (INDEPENDENT_AMBULATORY_CARE_PROVIDER_SITE_OTHER): Payer: BC Managed Care – PPO | Admitting: Orthopaedic Surgery

## 2020-04-19 ENCOUNTER — Encounter: Payer: Self-pay | Admitting: Orthopaedic Surgery

## 2020-04-19 ENCOUNTER — Other Ambulatory Visit: Payer: Self-pay | Admitting: Radiology

## 2020-04-19 DIAGNOSIS — R200 Anesthesia of skin: Secondary | ICD-10-CM

## 2020-04-19 DIAGNOSIS — Z981 Arthrodesis status: Secondary | ICD-10-CM

## 2020-04-19 NOTE — Progress Notes (Signed)
Office Visit Note   Patient: Caitlin Valdez           Date of Birth: 1971/06/26           MRN: [id number] Visit Date: 04/19/2020              Requested by: Elsie Stain, MD [address],  [place] [id number] PCP: Elsie Stain, MD   Assessment & Plan: Visit Diagnoses:  1. Numbness of upper extremity   2. S/P cervical spinal fusion     Plan: Patient relates history that she might have had possibly a stroke 15 years ago.  She had an MRI of her brain which was normal.  She continues to complain of numbness in upper extremities that sometimes wakes her up at night gives her difficulty sleeping.  Will obtain EMGs nerve conduction velocities to rule out peripheral nerve entrapment.  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  She states she was given a work note by another physician keeping her up currently.  Office follow-up after electrical test.  Follow-Up Instructions: return after EMG,NCV UE's  Orders:  No orders of the defined types were placed in this encounter.  No orders of the defined types were placed in this encounter.     Procedures: No procedures performed   Clinical Data: No additional findings.   Subjective: Chief Complaint  Patient presents with  . Neck - Follow-up    HPI 49 year old female returns post two-level cervical fusion C5-6 C6-7 done on 02/16/2020.  Patient states that she does not feel well enough to go back to work.  She is worked at [employer] for 4 years.  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  Patient states she has difficulty sleeping she has numbness in her hands has to shake her hands.  Hands bother during the day when she is active.  She states she has been trying to do some walking and is used some Aleve for discomfort.  Previous x-ray showed no motion on flexion extension good position of graft screws and plate.  Patient does have past history of  problems with anxiety and depression, also fibromyalgia..  She saw cardiology yesterday for follow-up about chest pain.  Review of Systems all other systems are negative other than listed above.   Objective: Vital Signs: BP 119/82 (BP Location: Left Arm, Patient Position: Sitting, Cuff Size: Large)   Pulse 74   Ht 5\' 3"  (1.6 m)   Wt 199 lb (90.3 kg)   BMI 35.25 kg/m   Physical Exam Constitutional:      Appearance: She is well-developed.  HENT:     Head: Normocephalic.     Right Ear: External ear normal.     Left Ear: External ear normal.  Eyes:     Pupils: Pupils are equal, round, and reactive to light.  Neck:     Thyroid: No thyromegaly.     Trachea: No tracheal deviation.  Cardiovascular:     Rate and Rhythm: Normal rate.  Pulmonary:     Effort: Pulmonary effort is normal.  Abdominal:     Palpations: Abdomen is soft.  Skin:    General: Skin is warm and dry.  Neurological:     Mental Status: She is alert and oriented to person, place, and time.  Psychiatric:        Behavior: Behavior normal.  Ortho Exam well-healed lumbar incision post microdiscectomy right L5-S1.  Well-healed cervical incision no brachial plexus tenderness no impingement of the shoulders upper extremity reflexes are 1-2+ and symmetrical.  No thenar or hyperthenar atrophy.  Some tenderness with palpation over the carpal canal and some finger numbness with Phalen's test right and left.  Specialty Comments:  No specialty comments available.  Imaging: No results found.   PMFS History: Patient Active Problem List   Diagnosis Date Noted  . Numbness of upper extremity 04/19/2020  . S/P cervical spinal fusion 04/19/2020  . Cervical spinal stenosis 02/16/2020  . Gastroesophageal reflux disease without esophagitis 02/01/2020  . S/P lumbar microdiscectomy 01/04/2020  . Radiculopathy due to lumbar intervertebral disc disorder 06/22/2019  . Anxiety and depression 03/17/2018  . S/P endometrial  ablation 06/27/2016  . Migraine without status migrainosus, not intractable 06/27/2016  . Vitamin D insufficiency 02/11/2016  . De Quervain's tenosynovitis, left 02/09/2016  . Vitamin B12 deficiency 10/17/2015  . Allergic rhinitis 12/29/2014  . Current smoker 11/22/2014  . Obesity 06/29/2014  . Fibromyalgia 07/15/2009   Past Medical History:  Diagnosis Date  . Abdominal pain, right lower quadrant   . Anxiety   . Arthritis   . Depression   . Fibromyalgia   . GERD (gastroesophageal reflux disease)   . Migraine   . Obesity   . Peripheral vascular disease (HCC)    RIGHT LEG  . PONV (postoperative nausea and vomiting)   . PTSD (post-traumatic stress disorder)   . Stroke [facility])    many years ago - no residual  . Trichomonas 2012  . Varicose veins of leg with swelling, right    "sometimes swelling."    Family History  Problem Relation Age of Onset  . Diabetes Mother   . Hypertension Mother   . Breast cancer Mother 51  . Diabetes Father   . Hypertension Father   . Diabetes Other   . Hypertension Other   . Other Neg Hx     Past Surgical History:  Procedure Laterality Date  . ANTERIOR CERVICAL DECOMP/DISCECTOMY FUSION N/A 02/16/2020   Procedure: CERVICAL FIVE THROUGH CERVICAL SIX, CERVICAL SIX  THROUGH CERVICAL SEVEN ANTERIOR CERVICAL DECOMPRESSION/DISCECTOMY FUSION, ALLOGRAFT, PLATE;  Surgeon: Marybelle Killings, MD;  Location: [facility];  Service: Orthopedics;  Laterality: N/A;  . CESAREAN SECTION    . FRACTURE SURGERY    . HYSTEROSCOPY WITH NOVASURE N/A 08/08/2014   Procedure:  NOVASURE;  Surgeon: Emily Filbert, MD;  Location: [facility];  Service: Gynecology;  Laterality: N/A;  . LUMBAR LAMINECTOMY/DECOMPRESSION MICRODISCECTOMY N/A 09/27/2019   Procedure: RIGHT LUMBAR FIVE TO SACRAL ONE MICRODISCECTOMY;  Surgeon: Marybelle Killings, MD;  Location: [facility];  Service: Orthopedics;  Laterality: N/A;  . VEIN SURGERY     Social History   Occupational History  . Not on file  Tobacco Use  . Smoking  status: Current Every Day Smoker    Packs/day: 1.00    Years: 5.00    Pack years: 5.00    Types: Cigarettes  . Smokeless tobacco: Never Used  . Tobacco comment: trying to quit  Vaping Use  . Vaping Use: Never used  Substance and Sexual Activity  . Alcohol use: No  . Drug use: Yes    Types: Marijuana    Comment: ocassional use for pain - 02/12/20  . Sexual activity: Yes    Birth control/protection: Condom

## 2020-04-24 ENCOUNTER — Telehealth: Payer: Self-pay

## 2020-04-24 DIAGNOSIS — Z0000 Encounter for general adult medical examination without abnormal findings: Secondary | ICD-10-CM

## 2020-04-24 NOTE — Telephone Encounter (Signed)
Called patient to discuss health coaching for smoking cessation per Dr. Newman Nickels referral. Patient is interested in starting health coaching and has been scheduled for an appointment on 05/01/20 at 8:30am.

## 2020-04-26 ENCOUNTER — Other Ambulatory Visit: Payer: Self-pay

## 2020-04-26 ENCOUNTER — Ambulatory Visit: Payer: BC Managed Care – PPO | Attending: Critical Care Medicine

## 2020-04-26 DIAGNOSIS — Z111 Encounter for screening for respiratory tuberculosis: Secondary | ICD-10-CM

## 2020-04-26 NOTE — Progress Notes (Signed)
Pt in clinic for PPD administration, administered to RFA, pt tolerated well, appt scheduled for reading on 04/28/20 @ 11am.

## 2020-04-28 ENCOUNTER — Other Ambulatory Visit: Payer: Self-pay

## 2020-04-28 ENCOUNTER — Ambulatory Visit: Payer: BC Managed Care – PPO | Attending: Critical Care Medicine

## 2020-04-28 DIAGNOSIS — Z111 Encounter for screening for respiratory tuberculosis: Secondary | ICD-10-CM | POA: Diagnosis not present

## 2020-04-28 LAB — TB SKIN TEST
Induration: 0 mm
TB Skin Test: NEGATIVE

## 2020-04-28 NOTE — Progress Notes (Signed)
Pt in clinic for PPD reading, 52mm induration, interpreted as negative, pt given letter

## 2020-04-28 NOTE — Progress Notes (Signed)
Pt returned today for reading, result negative, letter given

## 2020-05-01 ENCOUNTER — Telehealth: Payer: Self-pay

## 2020-05-01 ENCOUNTER — Ambulatory Visit: Payer: Self-pay

## 2020-05-01 DIAGNOSIS — Z0000 Encounter for general adult medical examination without abnormal findings: Secondary | ICD-10-CM

## 2020-05-01 NOTE — Telephone Encounter (Signed)
Called patient to determine if she wanted to reschedule her initial health coaching session. Patient stated that she just got back in town and overslept. Patient wanted to rescheduled later in the week. Next appt is scheduled for 05/04/20 at 9am.

## 2020-05-04 ENCOUNTER — Telehealth: Payer: Self-pay

## 2020-05-04 ENCOUNTER — Ambulatory Visit: Payer: BC Managed Care – PPO

## 2020-05-04 DIAGNOSIS — Z0000 Encounter for general adult medical examination without abnormal findings: Secondary | ICD-10-CM

## 2020-05-04 NOTE — Telephone Encounter (Signed)
Called patient to determine if she was going to arrive for scheduled appt. Left message for patient to return call to Care Guide to reschedule appt at [phone].

## 2020-05-05 ENCOUNTER — Other Ambulatory Visit: Payer: Self-pay

## 2020-05-05 ENCOUNTER — Ambulatory Visit (HOSPITAL_COMMUNITY): Payer: BC Managed Care – PPO | Attending: Cardiology

## 2020-05-05 DIAGNOSIS — R079 Chest pain, unspecified: Secondary | ICD-10-CM | POA: Diagnosis present

## 2020-05-05 LAB — ECHOCARDIOGRAM COMPLETE
Area-P 1/2: 3.83 cm2
S' Lateral: 2.9 cm

## 2020-05-12 ENCOUNTER — Ambulatory Visit (HOSPITAL_COMMUNITY)
Admission: RE | Admit: 2020-05-12 | Payer: BC Managed Care – PPO | Source: Ambulatory Visit | Attending: Cardiology | Admitting: Cardiology

## 2020-05-17 ENCOUNTER — Ambulatory Visit: Payer: BC Managed Care – PPO | Attending: Critical Care Medicine | Admitting: Internal Medicine

## 2020-05-17 ENCOUNTER — Encounter: Payer: Self-pay | Admitting: Internal Medicine

## 2020-05-17 ENCOUNTER — Other Ambulatory Visit: Payer: Self-pay

## 2020-05-17 ENCOUNTER — Telehealth: Payer: Self-pay | Admitting: Orthopaedic Surgery

## 2020-05-17 DIAGNOSIS — M797 Fibromyalgia: Secondary | ICD-10-CM

## 2020-05-17 MED ORDER — AMITRIPTYLINE HCL 10 MG PO TABS: 10.0000 mg | ORAL_TABLET | Freq: Every day | ORAL | 1 refills | Status: DC

## 2020-05-17 NOTE — Progress Notes (Signed)
Pt states she is having pain in her back  [facility] notes-  Patient with complicated orthopedic history. She has had long term low back pain and neck pain. I have reviewed cspine and l-spine surgical history. Patient states that she has continued back pain. She is able to walk 1.5 miles on "good days". She also complains of tingling sensation in both hands when she reads in bed.   Patient with a long hx of fibromyalgia.  Reviewed meds- she states gabapentin does not work for her and therefore she has stopped taking it.   Past Medical History:  Diagnosis Date  . Abdominal pain, right lower quadrant   . Anxiety   . Arthritis   . Depression   . Fibromyalgia   . GERD (gastroesophageal reflux disease)   . Migraine   . Obesity   . Peripheral vascular disease (HCC)    RIGHT LEG  . PONV (postoperative nausea and vomiting)   . PTSD (post-traumatic stress disorder)   . Stroke [facility])    many years ago - no residual  . Trichomonas 2012  . Varicose veins of leg with swelling, right    "sometimes swelling."    Social History   Socioeconomic History  . Marital status: Single    Spouse name: Not on file  . Number of children: Not on file  . Years of education: Not on file  . Highest education level: Not on file  Occupational History  . Not on file  Tobacco Use  . Smoking status: Current Every Day Smoker    Packs/day: 1.00    Years: 5.00    Pack years: 5.00    Types: Cigarettes  . Smokeless tobacco: Never Used  . Tobacco comment: trying to quit  Vaping Use  . Vaping Use: Never used  Substance and Sexual Activity  . Alcohol use: No  . Drug use: Yes    Types: Marijuana    Comment: ocassional use for pain - 02/12/20  . Sexual activity: Yes    Birth control/protection: Condom  Other Topics Concern  . Not on file  Social History Narrative  . Not on file   Social Determinants of Health   Financial Resource Strain:   . Difficulty of Paying Living Expenses: Not on file  Food  Insecurity:   . Worried About Charity fundraiser in the Last Year: Not on file  . Ran Out of Food in the Last Year: Not on file  Transportation Needs:   . Lack of Transportation (Medical): Not on file  . Lack of Transportation (Non-Medical): Not on file  Physical Activity:   . Days of Exercise per Week: Not on file  . Minutes of Exercise per Session: Not on file  Stress:   . Feeling of Stress : Not on file  Social Connections:   . Frequency of Communication with Friends and Family: Not on file  . Frequency of Social Gatherings with Friends and Family: Not on file  . Attends Religious Services: Not on file  . Active Member of Clubs or Organizations: Not on file  . Attends Archivist Meetings: Not on file  . Marital Status: Not on file  Intimate Partner Violence:   . Fear of Current or Ex-Partner: Not on file  . Emotionally Abused: Not on file  . Physically Abused: Not on file  . Sexually Abused: Not on file    Past Surgical History:  Procedure Laterality Date  . ANTERIOR CERVICAL DECOMP/DISCECTOMY FUSION N/A 02/16/2020  Procedure: CERVICAL FIVE THROUGH CERVICAL SIX, CERVICAL SIX  THROUGH CERVICAL SEVEN ANTERIOR CERVICAL DECOMPRESSION/DISCECTOMY FUSION, ALLOGRAFT, PLATE;  Surgeon: Marybelle Killings, MD;  Location: [facility];  Service: Orthopedics;  Laterality: N/A;  . CESAREAN SECTION    . FRACTURE SURGERY    . HYSTEROSCOPY WITH NOVASURE N/A 08/08/2014   Procedure:  NOVASURE;  Surgeon: Emily Filbert, MD;  Location: [facility];  Service: Gynecology;  Laterality: N/A;  . LUMBAR LAMINECTOMY/DECOMPRESSION MICRODISCECTOMY N/A 09/27/2019   Procedure: RIGHT LUMBAR FIVE TO SACRAL ONE MICRODISCECTOMY;  Surgeon: Marybelle Killings, MD;  Location: [facility];  Service: Orthopedics;  Laterality: N/A;  . VEIN SURGERY      Family History  Problem Relation Age of Onset  . Diabetes Mother   . Hypertension Mother   . Breast cancer Mother 84  . Diabetes Father   . Hypertension Father   . Diabetes Other   .  Hypertension Other   . Other Neg Hx     No Known Allergies  Current Outpatient Medications on File Prior to Visit  Medication Sig Dispense Refill  . omeprazole (PRILOSEC) 40 MG capsule Take 1 capsule (40 mg total) by mouth daily. 30 capsule 3  . vitamin B-12 (CYANOCOBALAMIN) 500 MCG tablet Take 1 tablet (500 mcg total) by mouth daily. 30 tablet 3  . Vitamin D, Ergocalciferol, (DRISDOL) 1.25 MG (50000 UNIT) CAPS capsule Take 1 capsule (50,000 Units total) by mouth every 7 (seven) days. 12 capsule 1  . gabapentin (NEURONTIN) 300 MG capsule Take 2 capsules (600 mg total) by mouth 3 (three) times daily. 180 capsule 1  . Misc. Devices MISC Please provide patient with insurance approved custom compression socks for PVD. 1 each 0  . polyethylene glycol powder (MIRALAX) 17 GM/SCOOP powder Mix 17 gm with 8 or more ounces of fluid and drink daily to help with constipation; 1 month supply (Patient taking differently: Take 17 g by mouth daily as needed for moderate constipation. ) 255 g prn   No current facility-administered medications on file prior to visit.     patient denies chest pain, shortness of breath, orthopnea. Denies lower extremity edema, abdominal pain, change in appetite, change in bowel movements. Patient denies rashes, musculoskeletal complaints. No other specific complaints in a complete review of systems.   BP 122/84   Pulse 70   Resp 16   Ht 5\' 3"  (1.6 m)   Wt 199 lb 6.4 oz (90.4 kg)   SpO2 99%   BMI 35.32 kg/m   Well-developed well-nourished female in no acute distress. HEENT exam atraumatic, normocephalic, extraocular muscles are intact. Neck is supple. No jugular venous distention no thyromegaly. Chest clear to auscultation without increased work of breathing. Cardiac exam S1 and S2 are regular. Abdominal exam, overweight,  active bowel sounds, soft, nontender. Extremities no edema. Neurologic exam she is alert without any motor sensory deficits. Gait is normal. She has FROM  both knees, she is tender to palpation over cspine, thoracic and lumbar spine DTRs at knee and ankle are normal.   Fibromyalgia She has a chronic pain syndrome. This will be tough to treat. I have advised that she stay extremely active with stretching and daily walking.  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   Discussed back and neck pain in context of fibromyalgia. Long term narcotics would be a poor solution. Trial amitriptyline 10 mg po qhs- should help with sleep and perhaps pain wihtin the next 8 weeks or so.

## 2020-05-17 NOTE — Assessment & Plan Note (Signed)
She has a chronic pain syndrome. This will be tough to treat. I have advised that she stay extremely active with stretching and daily walking.  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   Discussed back and neck pain in context of fibromyalgia. Long term narcotics would be a poor solution. Trial amitriptyline 10 mg po qhs- should help with sleep and perhaps pain wihtin the next 8 weeks or so.

## 2020-05-17 NOTE — Telephone Encounter (Signed)
[employer] forms received. Sent to [employer] 

## 2020-05-23 ENCOUNTER — Encounter: Payer: BC Managed Care – PPO | Admitting: Physical Medicine and Rehabilitation

## 2020-05-24 ENCOUNTER — Ambulatory Visit (HOSPITAL_COMMUNITY)
Admission: RE | Admit: 2020-05-24 | Payer: BC Managed Care – PPO | Source: Ambulatory Visit | Attending: Cardiology | Admitting: Cardiology

## 2020-05-29 ENCOUNTER — Ambulatory Visit: Payer: BC Managed Care – PPO | Attending: Family Medicine | Admitting: Family Medicine

## 2020-05-29 ENCOUNTER — Encounter: Payer: Self-pay | Admitting: Family Medicine

## 2020-05-29 ENCOUNTER — Other Ambulatory Visit (HOSPITAL_COMMUNITY)
Admission: RE | Admit: 2020-05-29 | Discharge: 2020-05-29 | Disposition: A | Discharge status: Home / Self Care | Payer: BC Managed Care – PPO | Source: Ambulatory Visit | Attending: Family Medicine | Admitting: Family Medicine

## 2020-05-29 ENCOUNTER — Other Ambulatory Visit: Payer: Self-pay

## 2020-05-29 VITALS — BP 125/71 | HR 99 | Ht 63.0 in | Wt 193.0 lb

## 2020-05-29 DIAGNOSIS — Z1211 Encounter for screening for malignant neoplasm of colon: Secondary | ICD-10-CM

## 2020-05-29 DIAGNOSIS — Z1231 Encounter for screening mammogram for malignant neoplasm of breast: Secondary | ICD-10-CM

## 2020-05-29 DIAGNOSIS — Z113 Encounter for screening for infections with a predominantly sexual mode of transmission: Secondary | ICD-10-CM | POA: Diagnosis present

## 2020-05-29 DIAGNOSIS — K219 Gastro-esophageal reflux disease without esophagitis: Secondary | ICD-10-CM

## 2020-05-29 DIAGNOSIS — Z124 Encounter for screening for malignant neoplasm of cervix: Secondary | ICD-10-CM | POA: Diagnosis not present

## 2020-05-29 DIAGNOSIS — Z791 Long term (current) use of non-steroidal anti-inflammatories (NSAID): Secondary | ICD-10-CM

## 2020-05-29 DIAGNOSIS — Z0000 Encounter for general adult medical examination without abnormal findings: Secondary | ICD-10-CM

## 2020-05-29 MED ORDER — OMEPRAZOLE 40 MG PO CPDR: 40.0000 mg | DELAYED_RELEASE_CAPSULE | Freq: Every day | ORAL | 3 refills | Status: DC

## 2020-05-29 MED ORDER — VITAMIN D (ERGOCALCIFEROL) 1.25 MG (50000 UNIT) PO CAPS: 50000.0000 [IU] | ORAL_CAPSULE | ORAL | 1 refills | Status: DC

## 2020-05-29 NOTE — Patient Instructions (Signed)
Health Maintenance After Age 48 After age 48, you are at a higher risk for certain long-term diseases and infections as well as injuries from falls. Falls are a major cause of broken bones and head injuries in people who are older than age 48. Getting regular preventive care can help to keep you healthy and well. Preventive care includes getting regular testing and making lifestyle changes as recommended by your health care provider. Talk with your health care provider about:  Which screenings and tests you should have. A screening is a test that checks for a disease when you have no symptoms.  A diet and exercise plan that is right for you. What should I know about screenings and tests to prevent falls? Screening and testing are the best ways to find a health problem early. Early diagnosis and treatment give you the best chance of managing medical conditions that are common after age 48. Certain conditions and lifestyle choices may make you more likely to have a fall. Your health care provider may recommend:  Regular vision checks. Poor vision and conditions such as cataracts can make you more likely to have a fall. If you wear glasses, make sure to get your prescription updated if your vision changes.  Medicine review. Work with your health care provider to regularly review all of the medicines you are taking, including over-the-counter medicines. Ask your health care provider about any side effects that may make you more likely to have a fall. Tell your health care provider if any medicines that you take make you feel dizzy or sleepy.  Osteoporosis screening. Osteoporosis is a condition that causes the bones to get weaker. This can make the bones weak and cause them to break more easily.  Blood pressure screening. Blood pressure changes and medicines to control blood pressure can make you feel dizzy.  Strength and balance checks. Your health care provider may recommend certain tests to check your  strength and balance while standing, walking, or changing positions.  Foot health exam. Foot pain and numbness, as well as not wearing proper footwear, can make you more likely to have a fall.  Depression screening. You may be more likely to have a fall if you have a fear of falling, feel emotionally low, or feel unable to do activities that you used to do.  Alcohol use screening. Using too much alcohol can affect your balance and may make you more likely to have a fall. What actions can I take to lower my risk of falls? General instructions  Talk with your health care provider about your risks for falling. Tell your health care provider if: ? You fall. Be sure to tell your health care provider about all falls, even ones that seem minor. ? You feel dizzy, sleepy, or off-balance.  Take over-the-counter and prescription medicines only as told by your health care provider. These include any supplements.  Eat a healthy diet and maintain a healthy weight. A healthy diet includes low-fat dairy products, low-fat (lean) meats, and fiber from whole grains, beans, and lots of fruits and vegetables. Home safety  Remove any tripping hazards, such as rugs, cords, and clutter.  Install safety equipment such as grab bars in bathrooms and safety rails on stairs.  Keep rooms and walkways well-lit. Activity   Follow a regular exercise program to stay fit. This will help you maintain your balance. Ask your health care provider what types of exercise are appropriate for you.  If you need a cane or   walker, use it as recommended by your health care provider.  Wear supportive shoes that have nonskid soles. Lifestyle  Do not drink alcohol if your health care provider tells you not to drink.  If you drink alcohol, limit how much you have: ? 0-1 drink a day for women. ? 0-2 drinks a day for men.  Be aware of how much alcohol is in your drink. In the [place] one drink equals one typical bottle of beer (12  oz), one-half glass of wine (5 oz), or one shot of hard liquor (1 oz).  Do not use any products that contain nicotine or tobacco, such as cigarettes and e-cigarettes. If you need help quitting, ask your health care provider. Summary  Having a healthy lifestyle and getting preventive care can help to protect your health and wellness after age 48.  Screening and testing are the best way to find a health problem early and help you avoid having a fall. Early diagnosis and treatment give you the best chance for managing medical conditions that are more common for people who are older than age 48.  Falls are a major cause of broken bones and head injuries in people who are older than age 48. Take precautions to prevent a fall at home.  Work with your health care provider to learn what changes you can make to improve your health and wellness and to prevent falls. This information is not intended to replace advice given to you by your health care provider. Make sure you discuss any questions you have with your health care provider. Document Revised: 10/22/2018 Document Reviewed: 05/14/2017 Elsevier Patient Education  2020 [employer]vier Inc.  

## 2020-05-29 NOTE — Progress Notes (Signed)
Needs referral for colonoscopy 

## 2020-05-29 NOTE — Progress Notes (Signed)
Subjective:  Patient ID: Caitlin Valdez, female    DOB: 1971-01-19  Age: 49 y.o. MRN: [id number]  CC: Annual Exam and Gynecologic Exam   HPI Caitlin Valdez is a 49 year old female with a history of lumbar microdiscectomy in 09/2019, fibromyalgia C5-6 and C6-7 spinal stenosis status post C5-6 and C6-7 ACDF on 02/16/2020 here for a complete physical exam. She is requesting a refill of omeprazole which she uses for GERD.  Also has vitamin D deficiency and was placed on Drisdol which she took for 3 months but has no more refills.  Past Medical History:  Diagnosis Date  . Abdominal pain, right lower quadrant   . Anxiety   . Arthritis   . Depression   . Fibromyalgia   . GERD (gastroesophageal reflux disease)   . Migraine   . Obesity   . Peripheral vascular disease (HCC)    RIGHT LEG  . PONV (postoperative nausea and vomiting)   . PTSD (post-traumatic stress disorder)   . Stroke [facility])    many years ago - no residual  . Trichomonas 2012  . Varicose veins of leg with swelling, right    "sometimes swelling."    Past Surgical History:  Procedure Laterality Date  . ANTERIOR CERVICAL DECOMP/DISCECTOMY FUSION N/A 02/16/2020   Procedure: CERVICAL FIVE THROUGH CERVICAL SIX, CERVICAL SIX  THROUGH CERVICAL SEVEN ANTERIOR CERVICAL DECOMPRESSION/DISCECTOMY FUSION, ALLOGRAFT, PLATE;  Surgeon: Marybelle Killings, MD;  Location: [place];  Service: Orthopedics;  Laterality: N/A;  . CESAREAN SECTION    . FRACTURE SURGERY    . HYSTEROSCOPY WITH NOVASURE N/A 08/08/2014   Procedure:  NOVASURE;  Surgeon: Emily Filbert, MD;  Location: [facility];  Service: Gynecology;  Laterality: N/A;  . LUMBAR LAMINECTOMY/DECOMPRESSION MICRODISCECTOMY N/A 09/27/2019   Procedure: RIGHT LUMBAR FIVE TO SACRAL ONE MICRODISCECTOMY;  Surgeon: Marybelle Killings, MD;  Location: [place];  Service: Orthopedics;  Laterality: N/A;  . VEIN SURGERY      Family History  Problem Relation Age of Onset  . Diabetes Mother   . Hypertension Mother   . Breast  cancer Mother 83  . Diabetes Father   . Hypertension Father   . Diabetes Other   . Hypertension Other   . Other Neg Hx     Allergies  Allergen Reactions  . Lyrica [Pregabalin]     Her report: tongue swelling    Outpatient Medications Prior to Visit  Medication Sig Dispense Refill  . amitriptyline (ELAVIL) 10 MG tablet Take 1 tablet (10 mg total) by mouth at bedtime. 30 tablet 1  . Misc. Devices MISC Please provide patient with insurance approved custom compression socks for PVD. 1 each 0  . polyethylene glycol powder (MIRALAX) 17 GM/SCOOP powder Mix 17 gm with 8 or more ounces of fluid and drink daily to help with constipation; 1 month supply (Patient taking differently: Take 17 g by mouth daily as needed for moderate constipation. ) 255 g prn  . vitamin B-12 (CYANOCOBALAMIN) 500 MCG tablet Take 1 tablet (500 mcg total) by mouth daily. 30 tablet 3  . omeprazole (PRILOSEC) 40 MG capsule Take 1 capsule (40 mg total) by mouth daily. 30 capsule 3  . Vitamin D, Ergocalciferol, (DRISDOL) 1.25 MG (50000 UNIT) CAPS capsule Take 1 capsule (50,000 Units total) by mouth every 7 (seven) days. 12 capsule 1   No facility-administered medications prior to visit.     ROS Review of Systems  Constitutional: Negative for activity change, appetite change and fatigue.  HENT: Negative for congestion, sinus pressure and sore throat.   Eyes: Negative for visual disturbance.  Respiratory: Negative for cough, chest tightness, shortness of breath and wheezing.   Cardiovascular: Negative for chest pain and palpitations.  Gastrointestinal: Negative for abdominal distention, abdominal pain and constipation.  Endocrine: Negative for polydipsia.  Genitourinary: Negative for dysuria and frequency.  Musculoskeletal: Negative for arthralgias and back pain.  Skin: Negative for rash.  Neurological: Negative for tremors, light-headedness and numbness.  Hematological: Does not bruise/bleed easily.   Psychiatric/Behavioral: Negative for agitation and behavioral problems.    Objective:  BP 125/71   Pulse 99   Ht 5\' 3"  (1.6 m)   Wt 193 lb (87.5 kg)   SpO2 96%   BMI 34.19 kg/m   BP/Weight 05/29/2020 05/17/2020 99/08/4266  Systolic BP 341 962 229  Diastolic BP 71 84 82  Wt. (Lbs) 193 199.4 199  BMI 34.19 35.32 35.25      Physical Exam Constitutional:      General: She is not in acute distress.    Appearance: She is well-developed. She is not diaphoretic.  HENT:     Head: Normocephalic.     Right Ear: External ear normal.     Left Ear: External ear normal.     Nose: Nose normal.  Eyes:     Conjunctiva/sclera: Conjunctivae normal.     Pupils: Pupils are equal, round, and reactive to light.  Neck:     Vascular: No JVD.  Cardiovascular:     Rate and Rhythm: Normal rate and regular rhythm.     Heart sounds: Normal heart sounds. No murmur heard.  No gallop.   Pulmonary:     Effort: Pulmonary effort is normal. No respiratory distress.     Breath sounds: Normal breath sounds. No wheezing or rales.  Chest:     Chest wall: No tenderness.  Abdominal:     General: Bowel sounds are normal. There is no distension.     Palpations: Abdomen is soft. There is no mass.     Tenderness: There is no abdominal tenderness.  Genitourinary:    Comments: External genitalia, vagina, cervix-all normal Musculoskeletal:        General: No tenderness. Normal range of motion.     Cervical back: Normal range of motion.  Skin:    General: Skin is warm and dry.  Neurological:     Mental Status: She is alert and oriented to person, place, and time.     Deep Tendon Reflexes: Reflexes are normal and symmetric.     CMP Latest Ref Rng & Units 02/01/2020 09/22/2019 04/29/2019  Glucose 65 - 99 mg/dL 89 90 87  BUN 6 - 24 mg/dL 6 5(L) 8  Creatinine 0.57 - 1.00 mg/dL 0.86 0.78 0.85  Sodium 134 - 144 mmol/L 139 137 138  Potassium 3.5 - 5.2 mmol/L 4.7 3.8 4.3  Chloride 96 - 106 mmol/L 102 107 104   CO2 20 - 29 mmol/L 21 23 24   Calcium 8.7 - 10.2 mg/dL 10.0 9.4 9.5  Total Protein 6.0 - 8.5 g/dL 6.9 7.2 7.1  Total Bilirubin 0.0 - 1.2 mg/dL 0.3 0.4 0.2  Alkaline Phos 48 - 121 IU/L 97 61 84  AST 0 - 40 IU/L 16 16 16   ALT 0 - 32 IU/L 13 17 19     Lipid Panel     Component Value Date/Time   CHOL 181 12/23/2013 1720   TRIG 103 12/23/2013 1720   HDL 75 12/23/2013 1720  CHOLHDL 2.4 12/23/2013 1720   VLDL 21 12/23/2013 1720   LDLCALC 85 12/23/2013 1720    CBC    Component Value Date/Time   WBC 10.0 02/01/2020 1614   WBC 7.8 09/22/2019 1048   RBC 4.81 02/01/2020 1614   RBC 4.31 09/22/2019 1048   HGB 15.3 02/01/2020 1614   HCT 44.8 02/01/2020 1614   PLT 262 02/01/2020 1614   MCV 93 02/01/2020 1614   MCH 31.8 02/01/2020 1614   MCH 32.5 09/22/2019 1048   MCHC 34.2 02/01/2020 1614   MCHC 33.0 09/22/2019 1048   RDW 12.2 02/01/2020 1614   LYMPHSABS 4.4 (H) 02/01/2020 1614   MONOABS 0.6 04/02/2018 2137   EOSABS 0.1 02/01/2020 1614   BASOSABS 0.0 02/01/2020 1614    Lab Results  Component Value Date   HGBA1C 5.4 03/18/2018    Assessment & Plan:  1. Annual physical exam Counseled on 150 minutes of exercise per week, healthy eating (including decreased daily intake of saturated fats, cholesterol, added sugars, sodium), STI prevention, routine healthcare maintenance.  2. Encounter for screening mammogram for malignant neoplasm of breast - MM 3D SCREEN BREAST BILATERAL; Future  3. Gastroesophageal reflux disease without esophagitis - omeprazole (PRILOSEC) 40 MG capsule; Take 1 capsule (40 mg total) by mouth daily.  Dispense: 30 capsule; Refill: 3  4. Screening for cervical cancer - Cytology - PAP([place])  5. Screening for STD (sexually transmitted disease) - Urine cytology ancillary only  6. GERD - omeprazole (PRILOSEC) 40 MG capsule; Take 1 capsule (40 mg total) by mouth daily.  Dispense: 30 capsule; Refill: 3  7. Screening for colon cancer - Ambulatory  referral to Gastroenterology    Meds ordered this encounter  Medications  . omeprazole (PRILOSEC) 40 MG capsule    Sig: Take 1 capsule (40 mg total) by mouth daily.    Dispense:  30 capsule    Refill:  3  . Vitamin D, Ergocalciferol, (DRISDOL) 1.25 MG (50000 UNIT) CAPS capsule    Sig: Take 1 capsule (50,000 Units total) by mouth every 7 (seven) days.    Dispense:  12 capsule    Refill:  1    Follow-up: Return in about 6 months (around 11/26/2020) for PCP-chronic disease management.       Charlott Rakes, MD, FAAFP. [facility] and [place], [place]   05/29/2020, 10:24 AM

## 2020-05-30 LAB — CERVICOVAGINAL ANCILLARY ONLY
Bacterial Vaginitis (gardnerella): NEGATIVE
Candida Glabrata: NEGATIVE
Candida Vaginitis: NEGATIVE
Chlamydia: NEGATIVE
Comment: NEGATIVE
Comment: NEGATIVE
Comment: NEGATIVE
Comment: NEGATIVE
Comment: NEGATIVE
Comment: NORMAL
Neisseria Gonorrhea: NEGATIVE
Trichomonas: NEGATIVE

## 2020-06-02 LAB — CYTOLOGY - PAP
Comment: NEGATIVE
Diagnosis: NEGATIVE
Diagnosis: REACTIVE
High risk HPV: NEGATIVE

## 2020-06-16 ENCOUNTER — Other Ambulatory Visit: Payer: Self-pay

## 2020-06-16 ENCOUNTER — Ambulatory Visit (HOSPITAL_COMMUNITY)
Admission: RE | Admit: 2020-06-16 | Discharge: 2020-06-16 | Disposition: A | Discharge status: Home / Self Care | Payer: BC Managed Care – PPO | Source: Ambulatory Visit | Attending: Cardiovascular Disease | Admitting: Cardiovascular Disease

## 2020-06-16 DIAGNOSIS — I739 Peripheral vascular disease, unspecified: Secondary | ICD-10-CM

## 2020-06-16 DIAGNOSIS — M79604 Pain in right leg: Secondary | ICD-10-CM

## 2020-06-16 DIAGNOSIS — M79605 Pain in left leg: Secondary | ICD-10-CM

## 2020-06-23 ENCOUNTER — Encounter: Payer: Self-pay | Admitting: *Deleted

## 2020-06-27 ENCOUNTER — Telehealth: Payer: Self-pay | Admitting: Orthopaedic Surgery

## 2020-06-27 NOTE — Telephone Encounter (Signed)
Patient's medical records request sent to [facility]

## 2020-07-03 ENCOUNTER — Ambulatory Visit: Payer: Self-pay

## 2020-07-03 ENCOUNTER — Telehealth: Payer: Self-pay | Admitting: Critical Care Medicine

## 2020-07-03 NOTE — Telephone Encounter (Signed)
Please make patient an appointment or direct to mobile unit.

## 2020-07-03 NOTE — Telephone Encounter (Signed)
PT requesting a cortisone shot or medication urgently for immense pain she's having for the past few days(See past encounters). Pt will also be bringing up paperwork to be filled by PCP. Please advise and thank you

## 2020-07-03 NOTE — Telephone Encounter (Signed)
Spoke with patient stated she will go tomorrow to the mobile unit / has scheduled appt at 11am

## 2020-07-03 NOTE — Telephone Encounter (Signed)
  Pt. Reports she has had back and neck surgery this past year. 1 month ago started having swelling and pain to right leg. Swelling goes up to her thigh. States she saw her cardiologist "and they checked me for a clot and said I didn't have one." "The doctor that did my surgery said I'm going to have this pain." Requesting pain medication or appointment. No availability. Pt. Does not want to be on hold. Please advise pt. If she can be seen today or tomorrow.  Answer Assessment - Initial Assessment Questions 1. ONSET: "When did the swelling start?" (e.g., minutes, hours, days)     1 month ago 2. LOCATION: "What part of the leg is swollen?"  "Are both legs swollen or just one leg?"     Right leg 3. SEVERITY: "How bad is the swelling?" (e.g., localized; mild, moderate, severe)  - Localized - small area of swelling localized to one leg  - MILD pedal edema - swelling limited to foot and ankle, pitting edema < 1/4 inch (6 mm) deep, rest and elevation eliminate most or all swelling  - MODERATE edema - swelling of lower leg to knee, pitting edema > 1/4 inch (6 mm) deep, rest and elevation only partially reduce swelling  - SEVERE edema - swelling extends above knee, facial or hand swelling present      Moderate 4. REDNESS: "Does the swelling look red or infected?"     No 5. PAIN: "Is the swelling painful to touch?" If Yes, ask: "How painful is it?"   (Scale 1-10; mild, moderate or severe)     15 6. FEVER: "Do you have a fever?" If Yes, ask: "What is it, how was it measured, and when did it start?"      No 7. CAUSE: "What do you think is causing the leg swelling?"     From my back 8. MEDICAL HISTORY: "Do you have a history of heart failure, kidney disease, liver failure, or cancer?"     No 9. RECURRENT SYMPTOM: "Have you had leg swelling before?" If Yes, ask: "When was the last time?" "What happened that time?"     No 10. OTHER SYMPTOMS: "Do you have any other symptoms?" (e.g., chest pain, difficulty  breathing)       No 11. PREGNANCY: "Is there any chance you are pregnant?" "When was your last menstrual period?"       no  Protocols used: LEG SWELLING AND EDEMA-A-AH

## 2020-07-04 ENCOUNTER — Other Ambulatory Visit: Payer: Self-pay

## 2020-07-04 ENCOUNTER — Telehealth: Payer: Self-pay | Admitting: Critical Care Medicine

## 2020-07-04 ENCOUNTER — Ambulatory Visit: Payer: BC Managed Care – PPO | Admitting: Physician Assistant

## 2020-07-04 VITALS — BP 122/78 | HR 93 | Temp 96.5°F | Resp 18 | Ht 63.0 in | Wt 184.4 lb

## 2020-07-04 DIAGNOSIS — R3129 Other microscopic hematuria: Secondary | ICD-10-CM | POA: Insufficient documentation

## 2020-07-04 DIAGNOSIS — G8929 Other chronic pain: Secondary | ICD-10-CM

## 2020-07-04 DIAGNOSIS — M797 Fibromyalgia: Secondary | ICD-10-CM

## 2020-07-04 DIAGNOSIS — M5440 Lumbago with sciatica, unspecified side: Secondary | ICD-10-CM

## 2020-07-04 DIAGNOSIS — M4802 Spinal stenosis, cervical region: Secondary | ICD-10-CM

## 2020-07-04 LAB — POCT URINALYSIS DIP (CLINITEK)
Bilirubin, UA: NEGATIVE
Glucose, UA: NEGATIVE mg/dL
Ketones, POC UA: NEGATIVE mg/dL
Leukocytes, UA: NEGATIVE
Nitrite, UA: NEGATIVE
POC PROTEIN,UA: NEGATIVE
Spec Grav, UA: >=1.03 — AB (ref 1.010–1.025)
Urobilinogen, UA: 2 E.U./dL — AB
pH, UA: 6.5 (ref 5.0–8.0)

## 2020-07-04 MED ORDER — OXYCODONE-ACETAMINOPHEN 5-325 MG PO TABS: 1.0000 | ORAL_TABLET | Freq: Three times a day (TID) | ORAL | 0 refills | Status: AC | PRN

## 2020-07-04 MED ORDER — KETOROLAC TROMETHAMINE 60 MG/2ML IM SOLN
60.0000 mg | Freq: Once | INTRAMUSCULAR | Status: AC
  Administered 2020-07-04: 60 mg via INTRAMUSCULAR

## 2020-07-04 MED ORDER — METHYLPREDNISOLONE ACETATE 40 MG/ML IJ SUSP
80.0000 mg | Freq: Once | INTRAMUSCULAR | Status: AC
Start: 2020-07-04 — End: 2020-07-04
  Administered 2020-07-04: 80 mg via INTRAMUSCULAR

## 2020-07-04 MED ORDER — AMITRIPTYLINE HCL 10 MG PO TABS: 20.0000 mg | ORAL_TABLET | Freq: Every day | ORAL | 1 refills | Status: DC

## 2020-07-04 NOTE — Telephone Encounter (Signed)
Pt states that she is taking a leave of absence due to her chronic neck pain, pt had surgery in august and states that she is still having lots of pain.  She states that she does not feel she can go back to work until next year due to a lot of upcoming appointments.   Paperwork has been placed in box

## 2020-07-04 NOTE — Telephone Encounter (Signed)
Patient came in to drop off paperwork for her PCP to fill out. Form will be placed in PCP box and patient was informed that once forms are filled out the nurse would call her. Patient would also like a call from Dr. Joya Gaskins.

## 2020-07-04 NOTE — Telephone Encounter (Signed)
Copied from [place] [id number]. Topic: General - Other >> Jul 04, 2020  2:01 PM Leward Quan A wrote: Reason for CRM: Patient called in to say that she came in the office not feeling well was told that an appointment was scheduled for her at 1.20 PM with the mobile bus and when she got there to her disappointment there was no appointment. She is just a bit upset because in the pain that sheis in she have to keep running [place] and [place].

## 2020-07-04 NOTE — Telephone Encounter (Signed)
Pt has appt scheduled at 3:20 on 07/04/2020 at the mobile unit.

## 2020-07-04 NOTE — Progress Notes (Signed)
Reports worsening chronic pain since Sat, radiating to R side/leg w/ swelling, requesting Cortisone injection

## 2020-07-04 NOTE — Patient Instructions (Signed)
You will start taking 20 mg of amitriptyline instead of 10mg .  I did send pain medication to the pharmacy to help you with severe pain.  I recommend following up with pain management as soon as you are able.  We will call you with the results of your urine culture, make sure that you are drinking plenty of water  I hope that you feel better soon  Kennieth Rad, PA-C Physician Assistant [facility] [email or web address]  Amitriptyline tablets What is this medicine? AMITRIPTYLINE (a mee TRIP ti leen) is used to treat depression. This medicine may be used for other purposes; ask your health care provider or pharmacist if you have questions. COMMON BRAND NAME(S): Elavil, Vanatrip What should I tell my health care provider before I take this medicine? They need to know if you have any of these conditions:  an alcohol problem  asthma, difficulty breathing  bipolar disorder or schizophrenia  difficulty passing urine, prostate trouble  glaucoma  heart disease or previous heart attack  liver disease  over active thyroid  seizures  thoughts or plans of suicide, a previous suicide attempt, or family history of suicide attempt  an unusual or allergic reaction to amitriptyline, other medicines, foods, dyes, or preservatives  pregnant or trying to get pregnant  breast-feeding How should I use this medicine? Take this medicine by mouth with a drink of water. Follow the directions on the prescription label. You can take the tablets with or without food. Take your medicine at regular intervals. Do not take it more often than directed. Do not stop taking this medicine suddenly except upon the advice of your doctor. Stopping this medicine too quickly may cause serious side effects or your condition may worsen. A special MedGuide will be given to you by the pharmacist with each prescription and refill. Be sure to read this information  carefully each time. Talk to your pediatrician regarding the use of this medicine in children. Special care may be needed. Overdosage: If you think you have taken too much of this medicine contact a poison control center or emergency room at once. NOTE: This medicine is only for you. Do not share this medicine with others. What if I miss a dose? If you miss a dose, take it as soon as you can. If it is almost time for your next dose, take only that dose. Do not take double or extra doses. What may interact with this medicine? Do not take this medicine with any of the following medications:  arsenic trioxide  certain medicines used to regulate abnormal heartbeat or to treat other heart conditions  cisapride  droperidol  halofantrine  linezolid  MAOIs like Carbex, Eldepryl, Marplan, Nardil, and Parnate  methylene blue  other medicines for mental depression  phenothiazines like perphenazine, thioridazine and chlorpromazine  pimozide  probucol  procarbazine  sparfloxacin  St. John's Wort This medicine may also interact with the following medications:  atropine and related drugs like hyoscyamine, scopolamine, tolterodine and others  barbiturate medicines for inducing sleep or treating seizures, like phenobarbital  cimetidine  disulfiram  ethchlorvynol  thyroid hormones such as levothyroxine  ziprasidone This list may not describe all possible interactions. Give your health care provider a list of all the medicines, herbs, non-prescription drugs, or dietary supplements you use. Also tell them if you smoke, drink alcohol, or use illegal drugs. Some items may interact with your medicine. What should I watch for while using this medicine? Tell your doctor if your symptoms  do not get better or if they get worse. Visit your doctor or health care professional for regular checks on your progress. Because it may take several weeks to see the full effects of this medicine, it  is important to continue your treatment as prescribed by your doctor. Patients and their families should watch out for new or worsening thoughts of suicide or depression. Also watch out for sudden changes in feelings such as feeling anxious, agitated, panicky, irritable, hostile, aggressive, impulsive, severely restless, overly excited and hyperactive, or not being able to sleep. If this happens, especially at the beginning of treatment or after a change in dose, call your health care professional. Dennis Bast may get drowsy or dizzy. Do not drive, use machinery, or do anything that needs mental alertness until you know how this medicine affects you. Do not stand or sit up quickly, especially if you are an older patient. This reduces the risk of dizzy or fainting spells. Alcohol may interfere with the effect of this medicine. Avoid alcoholic drinks. Do not treat yourself for coughs, colds, or allergies without asking your doctor or health care professional for advice. Some ingredients can increase possible side effects. Your mouth may get dry. Chewing sugarless gum or sucking hard candy, and drinking plenty of water will help. Contact your doctor if the problem does not go away or is severe. This medicine may cause dry eyes and blurred vision. If you wear contact lenses you may feel some discomfort. Lubricating drops may help. See your eye doctor if the problem does not go away or is severe. This medicine can cause constipation. Try to have a bowel movement at least every 2 to 3 days. If you do not have a bowel movement for 3 days, call your doctor or health care professional. This medicine can make you more sensitive to the sun. Keep out of the sun. If you cannot avoid being in the sun, wear protective clothing and use sunscreen. Do not use sun lamps or tanning beds/booths. What side effects may I notice from receiving this medicine? Side effects that you should report to your doctor or health care professional as  soon as possible:  allergic reactions like skin rash, itching or hives, swelling of the face, lips, or tongue  anxious  breathing problems  changes in vision  confusion  elevated mood, decreased need for sleep, racing thoughts, impulsive behavior  eye pain  fast, irregular heartbeat  feeling faint or lightheaded, falls  feeling agitated, angry, or irritable  fever with increased sweating  hallucination, loss of contact with reality  seizures  stiff muscles  suicidal thoughts or other mood changes  tingling, pain, or numbness in the feet or hands  trouble passing urine or change in the amount of urine  trouble sleeping  unusually weak or tired  vomiting  yellowing of the eyes or skin Side effects that usually do not require medical attention (report to your doctor or health care professional if they continue or are bothersome):  change in sex drive or performance  change in appetite or weight  constipation  dizziness  dry mouth  nausea  tired  tremors  upset stomach This list may not describe all possible side effects. Call your doctor for medical advice about side effects. You may report side effects to FDA at 1-800-FDA-1088. Where should I keep my medicine? Keep out of the reach of children. Store at room temperature between 20 and 25 degrees C (68 and 77 degrees F). Throw away  any unused medicine after the expiration date. NOTE: This sheet is a summary. It may not cover all possible information. If you have questions about this medicine, talk to your doctor, pharmacist, or health care provider.  2020 Elsevier/Gold Standard (2018-06-23 13:04:32)

## 2020-07-04 NOTE — Progress Notes (Signed)
Established Patient Office Visit  Subjective:  Patient ID: Caitlin Valdez, female    DOB: 1971-05-12  Age: 49 y.o. MRN: [id number]  CC:  Chief Complaint  Patient presents with  . Back Pain    Radiating to R leg   . Flank Pain    HPI [name] reports that she continues to have severe back and neck pain, states that she has been trying to tolerated using muscle relaxers, but states the last 2 days it has been intolerable and was not able to go to work today.  Reports that she has been seen by pain management but is unable to return until she pays an past-due bill with them.  Describes the pain as a 15 on a scale of 1-10, states that it radiates down her right leg, also endorses pain on movement in her right lower back.  Has previously failed Cymbalta, gabapentin, Lyrica, Tylenol 3  Reports that she was started on amitriptyline 10 mg at the beginning November, has been taking it at bedtime, states she has not noticed any improvement since starting   Denies any changes in bowel movements, no dysuria, urinary frequency, fever, tachycardia or chills     Past Medical History:  Diagnosis Date  . Abdominal pain, right lower quadrant   . Anxiety   . Arthritis   . Depression   . Fibromyalgia   . GERD (gastroesophageal reflux disease)   . Migraine   . Obesity   . Peripheral vascular disease (HCC)    RIGHT LEG  . PONV (postoperative nausea and vomiting)   . PTSD (post-traumatic stress disorder)   . Stroke [facility])    many years ago - no residual  . Trichomonas 2012  . Varicose veins of leg with swelling, right    "sometimes swelling."    Past Surgical History:  Procedure Laterality Date  . ANTERIOR CERVICAL DECOMP/DISCECTOMY FUSION N/A 02/16/2020   Procedure: CERVICAL FIVE THROUGH CERVICAL SIX, CERVICAL SIX  THROUGH CERVICAL SEVEN ANTERIOR CERVICAL DECOMPRESSION/DISCECTOMY FUSION, ALLOGRAFT, PLATE;  Surgeon: Marybelle Killings, MD;  Location: [facility];  Service: Orthopedics;   Laterality: N/A;  . CESAREAN SECTION    . FRACTURE SURGERY    . HYSTEROSCOPY WITH NOVASURE N/A 08/08/2014   Procedure:  NOVASURE;  Surgeon: Emily Filbert, MD;  Location: [facility];  Service: Gynecology;  Laterality: N/A;  . LUMBAR LAMINECTOMY/DECOMPRESSION MICRODISCECTOMY N/A 09/27/2019   Procedure: RIGHT LUMBAR FIVE TO SACRAL ONE MICRODISCECTOMY;  Surgeon: Marybelle Killings, MD;  Location: [place];  Service: Orthopedics;  Laterality: N/A;  . VEIN SURGERY      Family History  Problem Relation Age of Onset  . Diabetes Mother   . Hypertension Mother   . Breast cancer Mother 85  . Diabetes Father   . Hypertension Father   . Diabetes Other   . Hypertension Other   . Other Neg Hx     Social History   Socioeconomic History  . Marital status: Single    Spouse name: Not on file  . Number of children: Not on file  . Years of education: Not on file  . Highest education level: Not on file  Occupational History  . Not on file  Tobacco Use  . Smoking status: Current Every Day Smoker    Packs/day: 1.00    Years: 5.00    Pack years: 5.00    Types: Cigarettes  . Smokeless tobacco: Never Used  . Tobacco comment: trying to quit  Vaping Use  .  Vaping Use: Never used  Substance and Sexual Activity  . Alcohol use: No  . Drug use: Yes    Types: Marijuana    Comment: ocassional use for pain - 02/12/20  . Sexual activity: Yes    Birth control/protection: Condom  Other Topics Concern  . Not on file  Social History Narrative  . Not on file   Social Determinants of Health   Financial Resource Strain: Not on file  Food Insecurity: Not on file  Transportation Needs: Not on file  Physical Activity: Not on file  Stress: Not on file  Social Connections: Not on file  Intimate Partner Violence: Not on file    Outpatient Medications Prior to Visit  Medication Sig Dispense Refill  . Misc. Devices MISC Please provide patient with insurance approved custom compression socks for PVD. 1 each 0  .  omeprazole (PRILOSEC) 40 MG capsule Take 1 capsule (40 mg total) by mouth daily. 30 capsule 3  . polyethylene glycol powder (MIRALAX) 17 GM/SCOOP powder Mix 17 gm with 8 or more ounces of fluid and drink daily to help with constipation; 1 month supply (Patient taking differently: Take 17 g by mouth daily as needed for moderate constipation.) 255 g prn  . vitamin B-12 (CYANOCOBALAMIN) 500 MCG tablet Take 1 tablet (500 mcg total) by mouth daily. 30 tablet 3  . Vitamin D, Ergocalciferol, (DRISDOL) 1.25 MG (50000 UNIT) CAPS capsule Take 1 capsule (50,000 Units total) by mouth every 7 (seven) days. 12 capsule 1  . amitriptyline (ELAVIL) 10 MG tablet Take 1 tablet (10 mg total) by mouth at bedtime. 30 tablet 1   No facility-administered medications prior to visit.    Allergies  Allergen Reactions  . Lyrica [Pregabalin]     Her report: tongue swelling    ROS Review of Systems  Constitutional: Negative for chills, fatigue and fever.  HENT: Negative.   Eyes: Negative.   Respiratory: Negative.   Cardiovascular: Negative.   Gastrointestinal: Negative.   Endocrine: Negative.   Genitourinary: Negative for dysuria, frequency, hematuria and vaginal bleeding.  Musculoskeletal: Positive for arthralgias, back pain and neck pain.  Allergic/Immunologic: Negative.   Neurological: Negative.   Hematological: Negative.   Psychiatric/Behavioral: Negative.       Objective:    Physical Exam Vitals and nursing note reviewed.  Constitutional:      Appearance: Normal appearance.  HENT:     Head: Normocephalic and atraumatic.     Right Ear: External ear normal.     Left Ear: External ear normal.     Nose: Nose normal.     Mouth/Throat:     Mouth: Mucous membranes are moist.     Pharynx: Oropharynx is clear.  Eyes:     Extraocular Movements: Extraocular movements intact.     Conjunctiva/sclera: Conjunctivae normal.     Pupils: Pupils are equal, round, and reactive to light.  Cardiovascular:      Rate and Rhythm: Normal rate and regular rhythm.     Pulses: Normal pulses.     Heart sounds: Normal heart sounds.  Pulmonary:     Effort: Pulmonary effort is normal.     Breath sounds: Normal breath sounds.  Abdominal:     General: Abdomen is flat.     Palpations: Abdomen is soft.     Tenderness: There is no abdominal tenderness.  Musculoskeletal:     Right shoulder: Normal.     Left shoulder: Normal.     Cervical back: Tenderness present. Decreased range of  motion.     Thoracic back: Tenderness present. Decreased range of motion.     Lumbar back: Tenderness present. Decreased range of motion.  Skin:    General: Skin is warm and dry.  Neurological:     General: No focal deficit present.     Mental Status: She is alert and oriented to person, place, and time.  Psychiatric:        Mood and Affect: Mood normal.        Behavior: Behavior normal.        Thought Content: Thought content normal.        Judgment: Judgment normal.     BP 122/78 (BP Location: Left Arm, Patient Position: Sitting, Cuff Size: Large)   Pulse 93   Temp (!) 96.5 F (35.8 C) (Oral)   Resp 18   Ht 5\' 3"  (1.6 m)   Wt 184 lb 6.4 oz (83.6 kg)   SpO2 99%   BMI 32.66 kg/m  Wt Readings from Last 3 Encounters:  07/04/20 184 lb 6.4 oz (83.6 kg)  05/29/20 193 lb (87.5 kg)  05/17/20 199 lb 6.4 oz (90.4 kg)     There are no preventive care reminders to display for this patient.  There are no preventive care reminders to display for this patient.  Lab Results  Component Value Date   TSH 0.780 04/29/2019   Lab Results  Component Value Date   WBC 10.0 02/01/2020   HGB 15.3 02/01/2020   HCT 44.8 02/01/2020   MCV 93 02/01/2020   PLT 262 02/01/2020   Lab Results  Component Value Date   NA 139 02/01/2020   K 4.7 02/01/2020   CO2 21 02/01/2020   GLUCOSE 89 02/01/2020   BUN 6 02/01/2020   CREATININE 0.86 02/01/2020   BILITOT 0.3 02/01/2020   ALKPHOS 97 02/01/2020   AST 16 02/01/2020   ALT 13  02/01/2020   PROT 6.9 02/01/2020   ALBUMIN 4.5 02/01/2020   CALCIUM 10.0 02/01/2020   ANIONGAP 7 09/22/2019   Lab Results  Component Value Date   CHOL 181 12/23/2013   Lab Results  Component Value Date   HDL 75 12/23/2013   Lab Results  Component Value Date   LDLCALC 85 12/23/2013   Lab Results  Component Value Date   TRIG 103 12/23/2013   Lab Results  Component Value Date   CHOLHDL 2.4 12/23/2013   Lab Results  Component Value Date   HGBA1C 5.4 03/18/2018      Assessment & Plan:   Problem List Items Addressed This Visit      Nervous and Auditory   Chronic low back pain with sciatica - Primary   Relevant Medications   amitriptyline (ELAVIL) 10 MG tablet   oxyCODONE-acetaminophen (PERCOCET) 5-325 MG tablet   Other Relevant Orders   POCT URINALYSIS DIP (CLINITEK) (Completed)     Genitourinary   Other microscopic hematuria   Relevant Orders   Urine Culture     Other   Fibromyalgia (Chronic)   Relevant Medications   amitriptyline (ELAVIL) 10 MG tablet   oxyCODONE-acetaminophen (PERCOCET) 5-325 MG tablet   Cervical spinal stenosis   Relevant Medications   amitriptyline (ELAVIL) 10 MG tablet   oxyCODONE-acetaminophen (PERCOCET) 5-325 MG tablet    1. Chronic low back pain with sciatica, sciatica laterality unspecified, unspecified back pain laterality  Review of [place] controlled substance registry normal limits, last prescription in August 2021 patient encouraged to follow-up with pain management as soon as well.  Increase amitriptyline 20mg   Red flags given for prompt reevaluation at the emergency department for pain control.    - POCT URINALYSIS DIP (CLINITEK) - amitriptyline (ELAVIL) 10 MG tablet; Take 2 tablets (20 mg total) by mouth at bedtime.  Dispense: 60 tablet; Refill: 1 - oxyCODONE-acetaminophen (PERCOCET) 5-325 MG tablet; Take 1 tablet by mouth every 8 (eight) hours as needed for up to 3 days for severe pain.  Dispense: 9 tablet;  Refill: 0 - ketorolac (TORADOL) injection 60 mg - methylPREDNISolone acetate (DEPO-MEDROL) injection 80 mg  2. Cervical spinal stenosis  - amitriptyline (ELAVIL) 10 MG tablet; Take 2 tablets (20 mg total) by mouth at bedtime.  Dispense: 60 tablet; Refill: 1 - oxyCODONE-acetaminophen (PERCOCET) 5-325 MG tablet; Take 1 tablet by mouth every 8 (eight) hours as needed for up to 3 days for severe pain.  Dispense: 9 tablet; Refill: 0  3. Other microscopic hematuria  - Urine Culture  4. Fibromyalgia  - amitriptyline (ELAVIL) 10 MG tablet; Take 2 tablets (20 mg total) by mouth at bedtime.  Dispense: 60 tablet; Refill: 1 - oxyCODONE-acetaminophen (PERCOCET) 5-325 MG tablet; Take 1 tablet by mouth every 8 (eight) hours as needed for up to 3 days for severe pain.  Dispense: 9 tablet; Refill: 0    I have reviewed the patient's medical history (PMH, PSH, Social History, Family History, Medications, and allergies) , and have been updated if relevant. I spent 30 minutes reviewing chart and  face to face time with patient.    Meds ordered this encounter  Medications  . amitriptyline (ELAVIL) 10 MG tablet    Sig: Take 2 tablets (20 mg total) by mouth at bedtime.    Dispense:  60 tablet    Refill:  1    Dose change    Order Specific Question:   Supervising Provider    Answer:   Asencion Noble E [[id number]]  . oxyCODONE-acetaminophen (PERCOCET) 5-325 MG tablet    Sig: Take 1 tablet by mouth every 8 (eight) hours as needed for up to 3 days for severe pain.    Dispense:  9 tablet    Refill:  0    Order Specific Question:   Supervising Provider    Answer:   Elsie Stain [[id number]]    Follow-up: Return if symptoms worsen or fail to improve.    Loraine Grip Mayers, PA-C

## 2020-07-05 NOTE — Telephone Encounter (Signed)
Pt was called and informed that paperwork is ready for pick up. 

## 2020-07-05 NOTE — Telephone Encounter (Signed)
Please do not share with patients that they have appointments with the [facility] unless it was given directly from the [facility] team. The [facility] is a walk-in clinic that can have patients present to the bus prior to a clinic patient making it to the unit. Patients will be spoken to directly and scheduled for same day or provided [facility] information for later dates by the case manager [facility].

## 2020-07-05 NOTE — Telephone Encounter (Signed)
Completed as best as I can given other providers have been seeing this patient this fall

## 2020-07-06 LAB — URINE CULTURE

## 2020-07-06 NOTE — Telephone Encounter (Signed)
Patient verified DOB Patient is aware of urine culture being negative for infection.

## 2020-07-06 NOTE — Telephone Encounter (Signed)
-----   Message from Kennieth Rad, [place] sent at 07/06/2020  9:30 AM EST ----- Please let patient know that her urine culture was negative

## 2020-07-10 ENCOUNTER — Encounter (HOSPITAL_COMMUNITY): Payer: Self-pay | Admitting: Emergency Medicine

## 2020-07-10 ENCOUNTER — Encounter (HOSPITAL_COMMUNITY): Payer: Self-pay

## 2020-07-10 ENCOUNTER — Ambulatory Visit (HOSPITAL_COMMUNITY)
Admission: EM | Admit: 2020-07-10 | Discharge: 2020-07-10 | Disposition: A | Discharge status: Other Acute Inpatient Hospital | Payer: BC Managed Care – PPO

## 2020-07-10 ENCOUNTER — Emergency Department (HOSPITAL_COMMUNITY)
Admission: EM | Admit: 2020-07-10 | Discharge: 2020-07-10 | Disposition: A | Discharge status: Home / Self Care | Payer: BC Managed Care – PPO | Attending: Emergency Medicine | Admitting: Emergency Medicine

## 2020-07-10 ENCOUNTER — Emergency Department (HOSPITAL_COMMUNITY): Payer: BC Managed Care – PPO

## 2020-07-10 ENCOUNTER — Other Ambulatory Visit: Payer: Self-pay

## 2020-07-10 ENCOUNTER — Telehealth: Payer: Self-pay | Admitting: Orthopaedic Surgery

## 2020-07-10 DIAGNOSIS — F17210 Nicotine dependence, cigarettes, uncomplicated: Secondary | ICD-10-CM | POA: Insufficient documentation

## 2020-07-10 DIAGNOSIS — Z91040 Latex allergy status: Secondary | ICD-10-CM | POA: Diagnosis not present

## 2020-07-10 DIAGNOSIS — M549 Dorsalgia, unspecified: Secondary | ICD-10-CM | POA: Diagnosis present

## 2020-07-10 DIAGNOSIS — M79604 Pain in right leg: Secondary | ICD-10-CM | POA: Diagnosis not present

## 2020-07-10 DIAGNOSIS — M546 Pain in thoracic spine: Secondary | ICD-10-CM | POA: Insufficient documentation

## 2020-07-10 DIAGNOSIS — R109 Unspecified abdominal pain: Secondary | ICD-10-CM | POA: Insufficient documentation

## 2020-07-10 DIAGNOSIS — R1110 Vomiting, unspecified: Secondary | ICD-10-CM | POA: Insufficient documentation

## 2020-07-10 DIAGNOSIS — M5441 Lumbago with sciatica, right side: Secondary | ICD-10-CM | POA: Diagnosis not present

## 2020-07-10 DIAGNOSIS — R10817 Generalized abdominal tenderness: Secondary | ICD-10-CM | POA: Diagnosis not present

## 2020-07-10 LAB — URINALYSIS, ROUTINE W REFLEX MICROSCOPIC
Bilirubin Urine: NEGATIVE
Glucose, UA: NEGATIVE mg/dL
Ketones, ur: 5 mg/dL — AB
Leukocytes,Ua: NEGATIVE
Nitrite: NEGATIVE
Protein, ur: NEGATIVE mg/dL
Specific Gravity, Urine: 1.016 (ref 1.005–1.030)
pH: 5 (ref 5.0–8.0)

## 2020-07-10 LAB — CBC
HCT: 45 % (ref 36.0–46.0)
Hemoglobin: 15.2 g/dL — ABNORMAL HIGH (ref 12.0–15.0)
MCH: 32.3 pg (ref 26.0–34.0)
MCHC: 33.8 g/dL (ref 30.0–36.0)
MCV: 95.5 fL (ref 80.0–100.0)
Platelets: 262 10*3/uL (ref 150–400)
RBC: 4.71 MIL/uL (ref 3.87–5.11)
RDW: 12.6 % (ref 11.5–15.5)
WBC: 8.7 10*3/uL (ref 4.0–10.5)
nRBC: 0 % (ref 0.0–0.2)

## 2020-07-10 LAB — COMPREHENSIVE METABOLIC PANEL
ALT: 15 U/L (ref 0–44)
AST: 15 U/L (ref 15–41)
Albumin: 4 g/dL (ref 3.5–5.0)
Alkaline Phosphatase: 79 U/L (ref 38–126)
Anion gap: 10 (ref 5–15)
BUN: 8 mg/dL (ref 6–20)
CO2: 20 mmol/L — ABNORMAL LOW (ref 22–32)
Calcium: 9.7 mg/dL (ref 8.9–10.3)
Chloride: 105 mmol/L (ref 98–111)
Creatinine, Ser: 0.81 mg/dL (ref 0.44–1.00)
GFR, Estimated: >60 mL/min (ref >60)
Glucose, Bld: 94 mg/dL (ref 70–99)
Potassium: 4.3 mmol/L (ref 3.5–5.1)
Sodium: 135 mmol/L (ref 135–145)
Total Bilirubin: 0.6 mg/dL (ref 0.3–1.2)
Total Protein: 7.4 g/dL (ref 6.5–8.1)

## 2020-07-10 LAB — I-STAT BETA HCG BLOOD, ED (MC, WL, AP ONLY): I-stat hCG, quantitative: <5 m[IU]/mL (ref <5)

## 2020-07-10 LAB — LIPASE, BLOOD: Lipase: 26 U/L (ref 11–51)

## 2020-07-10 MED ORDER — NAPROXEN 375 MG PO TABS: 375.0000 mg | ORAL_TABLET | Freq: Two times a day (BID) | ORAL | 0 refills | Status: DC

## 2020-07-10 MED ORDER — SODIUM CHLORIDE 0.9 % IV BOLUS
1000.0000 mL | Freq: Once | INTRAVENOUS | Status: AC
  Administered 2020-07-10: 1000 mL via INTRAVENOUS

## 2020-07-10 MED ORDER — KETOROLAC TROMETHAMINE 15 MG/ML IJ SOLN
15.0000 mg | Freq: Once | INTRAMUSCULAR | Status: AC
  Administered 2020-07-10: 15 mg via INTRAVENOUS
  Filled 2020-07-10: qty 1

## 2020-07-10 MED ORDER — ONDANSETRON HCL 4 MG/2ML IJ SOLN
4.0000 mg | Freq: Once | INTRAMUSCULAR | Status: AC | PRN
  Administered 2020-07-10: 4 mg via INTRAVENOUS
  Filled 2020-07-10: qty 2

## 2020-07-10 MED ORDER — OXYCODONE-ACETAMINOPHEN 5-325 MG PO TABS
1.0000 | ORAL_TABLET | Freq: Once | ORAL | Status: AC
  Administered 2020-07-10: 1 via ORAL
  Filled 2020-07-10: qty 1

## 2020-07-10 MED ORDER — HYDROMORPHONE HCL 1 MG/ML IJ SOLN
1.0000 mg | Freq: Once | INTRAMUSCULAR | Status: AC
  Administered 2020-07-10: 1 mg via INTRAVENOUS
  Filled 2020-07-10: qty 1

## 2020-07-10 NOTE — ED Provider Notes (Signed)
[facility] EMERGENCY DEPARTMENT Provider Note   CSN: [id number] Arrival date & time: 07/10/20  1119     History Chief Complaint  Patient presents with  . Flank Pain    Caitlin Valdez is a 49 y.o. female.  HPI 49 year old female presents with severe back pain. She has had back pain for 2+ weeks. Doctor told her it was sciatica, but she doesn't think this is the correct diagnosis. Pain goes all the way to her toes, encompassing her entire right leg. Also wraps around to her abdomen and has abdominal pain. Some vomiting this morning. No fevers. Leg hurts, no weakness. No numbness. No urinary symptoms. Pain is severe.    Past Medical History:  Diagnosis Date  . Abdominal pain, right lower quadrant   . Anxiety   . Arthritis   . Depression   . Fibromyalgia   . GERD (gastroesophageal reflux disease)   . Migraine   . Obesity   . Peripheral vascular disease (HCC)    RIGHT LEG  . PONV (postoperative nausea and vomiting)   . PTSD (post-traumatic stress disorder)   . Stroke [facility])    many years ago - no residual  . Trichomonas 2012  . Varicose veins of leg with swelling, right    "sometimes swelling."    Patient Active Problem List   Diagnosis Date Noted  . Other microscopic hematuria 07/04/2020  . Numbness of upper extremity 04/19/2020  . S/P cervical spinal fusion 04/19/2020  . Cervical spinal stenosis 02/16/2020  . Gastroesophageal reflux disease without esophagitis 02/01/2020  . S/P lumbar microdiscectomy 01/04/2020  . Chronic low back pain with sciatica 08/10/2019  . Radiculopathy due to lumbar intervertebral disc disorder 06/22/2019  . Anxiety and depression 03/17/2018  . S/P endometrial ablation 06/27/2016  . Migraine without status migrainosus, not intractable 06/27/2016  . Vitamin D insufficiency 02/11/2016  . De Quervain's tenosynovitis, left 02/09/2016  . Vitamin B12 deficiency 10/17/2015  . Allergic rhinitis 12/29/2014  . Current smoker  11/22/2014  . Obesity 06/29/2014  . Fibromyalgia 07/15/2009    Past Surgical History:  Procedure Laterality Date  . ANTERIOR CERVICAL DECOMP/DISCECTOMY FUSION N/A 02/16/2020   Procedure: CERVICAL FIVE THROUGH CERVICAL SIX, CERVICAL SIX  THROUGH CERVICAL SEVEN ANTERIOR CERVICAL DECOMPRESSION/DISCECTOMY FUSION, ALLOGRAFT, PLATE;  Surgeon: Marybelle Killings, MD;  Location: [place];  Service: Orthopedics;  Laterality: N/A;  . CESAREAN SECTION    . FRACTURE SURGERY    . HYSTEROSCOPY WITH NOVASURE N/A 08/08/2014   Procedure:  NOVASURE;  Surgeon: Emily Filbert, MD;  Location: [place] [facility];  Service: Gynecology;  Laterality: N/A;  . LUMBAR LAMINECTOMY/DECOMPRESSION MICRODISCECTOMY N/A 09/27/2019   Procedure: RIGHT LUMBAR FIVE TO SACRAL ONE MICRODISCECTOMY;  Surgeon: Marybelle Killings, MD;  Location: [place];  Service: Orthopedics;  Laterality: N/A;  . VEIN SURGERY       OB History    Gravida  2   Para  2   Term  2   Preterm      AB      Living  2     SAB      IAB      Ectopic      Multiple      Live Births              Family History  Problem Relation Age of Onset  . Diabetes Mother   . Hypertension Mother   . Breast cancer Mother 2  . Diabetes Father   .  Hypertension Father   . Diabetes Other   . Hypertension Other   . Other Neg Hx     Social History   Tobacco Use  . Smoking status: Current Every Day Smoker    Packs/day: 1.00    Years: 5.00    Pack years: 5.00    Types: Cigarettes  . Smokeless tobacco: Never Used  . Tobacco comment: trying to quit  Vaping Use  . Vaping Use: Never used  Substance Use Topics  . Alcohol use: No  . Drug use: Yes    Types: Marijuana    Comment: ocassional use for pain - 02/12/20    Home Medications Prior to Admission medications   Medication Sig Start Date End Date Taking? Authorizing Provider  amitriptyline (ELAVIL) 10 MG tablet Take 2 tablets (20 mg total) by mouth at bedtime. 07/04/20  Yes Mayers, Cari S, PA-C  Misc. Devices MISC  Please provide patient with insurance approved custom compression socks for PVD. 10/26/18  Yes Claiborne Rigg, NP  naproxen (NAPROSYN) 375 MG tablet Take 1 tablet (375 mg total) by mouth 2 (two) times daily. 07/10/20  Yes Pricilla Loveless, MD  omeprazole (PRILOSEC) 40 MG capsule Take 1 capsule (40 mg total) by mouth daily. 05/29/20  Yes Hoy Register, MD  polyethylene glycol powder (MIRALAX) 17 GM/SCOOP powder Mix 17 gm with 8 or more ounces of fluid and drink daily to help with constipation; 1 month supply Patient taking differently: Take 17 g by mouth daily as needed for moderate constipation. 04/29/19  Yes Fulp, Cammie, MD  Vitamin D, Ergocalciferol, (DRISDOL) 1.25 MG (50000 UNIT) CAPS capsule Take 1 capsule (50,000 Units total) by mouth every 7 (seven) days. 05/29/20  Yes Hoy Register, MD  vitamin B-12 (CYANOCOBALAMIN) 500 MCG tablet Take 1 tablet (500 mcg total) by mouth daily. Patient not taking: Reported on 07/10/2020 02/01/20   Storm Frisk, MD    Allergies    Latex and Lyrica [pregabalin]  Review of Systems   Review of Systems  Constitutional: Negative for fever.  Gastrointestinal: Positive for abdominal pain and vomiting.  Genitourinary: Negative for dysuria.  Musculoskeletal: Positive for back pain.  Neurological: Negative for numbness.  All other systems reviewed and are negative.   Physical Exam Updated Vital Signs BP 118/69 (BP Location: Left Arm)   Pulse 72   Temp 98.1 F (36.7 C) (Oral)   Resp 16   SpO2 100%   Physical Exam Vitals and nursing note reviewed.  Constitutional:      General: She is in acute distress (in pain).     Appearance: She is well-developed and well-nourished. She is not ill-appearing or diaphoretic.  HENT:     Head: Normocephalic and atraumatic.     Right Ear: External ear normal.     Left Ear: External ear normal.     Nose: Nose normal.  Eyes:     General:        Right eye: No discharge.        Left eye: No discharge.   Cardiovascular:     Rate and Rhythm: Normal rate and regular rhythm.     Heart sounds: Normal heart sounds.  Pulmonary:     Effort: Pulmonary effort is normal.     Breath sounds: Normal breath sounds.  Abdominal:     Palpations: Abdomen is soft.     Tenderness: There is generalized abdominal tenderness.  Musculoskeletal:     Thoracic back: Tenderness present.     Lumbar back: Tenderness  present.     Comments: Diffuse midline and right sided lower thoracic and lumbar tenderness  Skin:    General: Skin is warm and dry.  Neurological:     Mental Status: She is alert.     Comments: 5/5 strength in BLE. Normal gross sensation  Psychiatric:        Mood and Affect: Mood is not anxious.     ED Results / Procedures / Treatments   Labs (all labs ordered are listed, but only abnormal results are displayed) Labs Reviewed  COMPREHENSIVE METABOLIC PANEL - Abnormal; Notable for the following components:      Result Value   CO2 20 (*)    All other components within normal limits  CBC - Abnormal; Notable for the following components:   Hemoglobin 15.2 (*)    All other components within normal limits  URINALYSIS, ROUTINE W REFLEX MICROSCOPIC - Abnormal; Notable for the following components:   APPearance HAZY (*)    Hgb urine dipstick LARGE (*)    Ketones, ur 5 (*)    Bacteria, UA RARE (*)    All other components within normal limits  LIPASE, BLOOD  PREGNANCY, URINE  I-STAT BETA HCG BLOOD, ED ([facility], [facility], [facility] ONLY)    EKG None  Radiology CT Renal Stone Study  Result Date: 07/10/2020 CLINICAL DATA:  Flank pain, suspected kidney stone in a 49 year old female EXAM: CT ABDOMEN AND PELVIS WITHOUT CONTRAST TECHNIQUE: Multidetector CT imaging of the abdomen and pelvis was performed following the standard protocol without IV contrast. COMPARISON:  July 17, 2016 FINDINGS: Lower chest: Lung bases are clear without signs of consolidation or pleural effusion. Hepatobiliary: No focal lesion on  noncontrast imaging. Liver contour is smooth. Gallbladder without signs of pericholecystic stranding. No gross biliary duct distension, limited assessment. Pancreas: Question mild stranding about the pancreatic head. There is mild motion in this location which does limit assessment. No signs of contour abnormality of the pancreas. Spleen: Smooth contour of the spleen, normal splenic size. Adrenals/Urinary Tract: Adrenal glands are normal. Renal contours are smooth. No hydronephrosis. No discrete calculus that can be localized to the ureter. No Peri ureteral stranding. Stomach/Bowel: Normal appendix. No signs of acute gastrointestinal process. No pelvic sidewall lymphadenopathy. Vascular/Lymphatic: Normal caliber abdominal aorta. Limited assessment on noncontrast imaging. There is no gastrohepatic or hepatoduodenal ligament lymphadenopathy. No retroperitoneal or mesenteric lymphadenopathy. Reproductive: Uterus and adnexa with stable contour compared to previous imaging, no acute process in the pelvis Other: No ascites.  Small fat containing umbilical hernia. Musculoskeletal: No acute bone finding. No destructive bone process. IMPRESSION: 1. Question mild stranding about the pancreatic head, correlate with any clinical or laboratory evidence of pancreatitis. This could also be due to motion artifact. 2. No signs of hydronephrosis or discrete calculus that can be localized to the ureter. No secondary signs that would support ureteral calculus as well with similar pattern of calcifications due to numerous phleboliths in the pelvis when compared to remote imaging from January of 2018. 3. Normal appendix. 4. Small fat containing umbilical hernia. Electronically Signed   By: Zetta Bills M.D.   On: 07/10/2020 16:06    Procedures Procedures (including critical care time)  Medications Ordered in ED Medications  oxyCODONE-acetaminophen (PERCOCET/ROXICET) 5-325 MG per tablet 1 tablet (1 tablet Oral Given 07/10/20  1203)  HYDROmorphone (DILAUDID) injection 1 mg (1 mg Intravenous Given 07/10/20 1250)  sodium chloride 0.9 % bolus 1,000 mL (0 mLs Intravenous Stopped 07/10/20 1400)  ondansetron (ZOFRAN) injection 4 mg (4  mg Intravenous Given 07/10/20 1259)  ketorolac (TORADOL) 15 MG/ML injection 15 mg (15 mg Intravenous Given 07/10/20 1627)    ED Course  I have reviewed the triage vital signs and the nursing notes.  Pertinent labs & imaging results that were available during my care of the patient were reviewed by me and considered in my medical decision making (see chart for details).    MDM Rules/Calculators/A&P                          Patient's pain is significantly improved.  Labs are reassuring.  CT with possible mild pancreatitis but this does not correlate with her labs or presentation.  I think this was probably artifact.  Overall I think she has sciatica.  She does not have any acute neuro deficits or indication for emergent MRI.  Will treat with NSAIDs in addition to her muscle relaxer and some leftover oxycodone she has.  I think a large part of this is some acute on chronic pain.  Follow-up with her orthopedic surgeon. Final Clinical Impression(s) / ED Diagnoses Final diagnoses:  Acute right-sided low back pain with right-sided sciatica    Rx / DC Orders ED Discharge Orders         Ordered    naproxen (NAPROSYN) 375 MG tablet  2 times daily        07/10/20 1620           Sherwood Gambler, MD 07/10/20 1648

## 2020-07-10 NOTE — Telephone Encounter (Signed)
FYI

## 2020-07-10 NOTE — ED Notes (Signed)
Patient is being discharged from the Urgent Care and sent to the Emergency Department via UC Staff. Per Delton See MD, patient is in need of higher level of care due to Extreme Back to ABD Pain and Necessity for Higher level of Care. Patient is aware and verbalizes understanding of plan of care.  Vitals:   07/10/20 1101  BP: (!) 160/89  Pulse: (!) 117  Resp: (!) 21  Temp: 97.9 F (36.6 C)  SpO2: 100%

## 2020-07-10 NOTE — Telephone Encounter (Signed)
Pt called stating she is In extreme pain due to her sciatica. I set her up an apt with Ophelia Charter this wednesday but pt states she is going to have to go to the hospital due to the pain.

## 2020-07-10 NOTE — ED Triage Notes (Signed)
Pt reports she is here today due to right sided back pain shooting down leg x2 weeks. Pt reports it hurts to sit and stand pt reports the pain is located in her right flank. Pt reports lower right sided abd pain.

## 2020-07-10 NOTE — ED Triage Notes (Signed)
Patient c/o RT sided back pain shooting down leg x 2 weeks.   Patient endorses "extreme worsening pain".   Patient endorses that sitting and standing makes pain worst.   Patient endorses nausea.    Patient had back surgery in August.

## 2020-07-10 NOTE — ED Notes (Signed)
Patient discharge instructions reviewed with the patient. The patient verbalized understanding of instructions. Patient discharged. 

## 2020-07-10 NOTE — Discharge Instructions (Signed)
If you develop worsening, recurrent, or continued back pain, numbness or weakness in the legs, incontinence of your bowels or bladders, numbness of your buttocks, fever, abdominal pain, or any other new/concerning symptoms then return to the ER for evaluation.   You are being prescribed naproxen. Do not take Ibuprofen/Advil/Aleve/Motrin/Goody Powders/BC powders/Meloxicam/Diclofenac/Indomethacin and other Nonsteroidal anti-inflammatory medications

## 2020-07-11 ENCOUNTER — Other Ambulatory Visit: Payer: Self-pay | Admitting: Orthopaedic Surgery

## 2020-07-11 ENCOUNTER — Telehealth: Payer: Self-pay

## 2020-07-11 DIAGNOSIS — M545 Low back pain, unspecified: Secondary | ICD-10-CM

## 2020-07-11 NOTE — Progress Notes (Unsigned)
Office Visit Note   Patient: Caitlin Valdez           Date of Birth: Oct 04, 1970           MRN: [id number] Visit Date: 07/11/2020              Requested by: No referring provider defined for this encounter. PCP: Elsie Stain, MD   Assessment & Plan: Visit Diagnoses: No diagnosis found.  Plan: ***  Follow-Up Instructions: No follow-ups on file.   Orders:  No orders of the defined types were placed in this encounter.  No orders of the defined types were placed in this encounter.     Procedures: No procedures performed   Clinical Data: No additional findings.   Subjective: No chief complaint on file.   HPI  Review of Systems   Objective: Vital Signs: There were no vitals taken for this visit.  Physical Exam  Ortho Exam  Specialty Comments:  No specialty comments available.  Imaging: CT Renal Stone Study  Result Date: 07/10/2020 CLINICAL DATA:  Flank pain, suspected kidney stone in a 49 year old female EXAM: CT ABDOMEN AND PELVIS WITHOUT CONTRAST TECHNIQUE: Multidetector CT imaging of the abdomen and pelvis was performed following the standard protocol without IV contrast. COMPARISON:  July 17, 2016 FINDINGS: Lower chest: Lung bases are clear without signs of consolidation or pleural effusion. Hepatobiliary: No focal lesion on noncontrast imaging. Liver contour is smooth. Gallbladder without signs of pericholecystic stranding. No gross biliary duct distension, limited assessment. Pancreas: Question mild stranding about the pancreatic head. There is mild motion in this location which does limit assessment. No signs of contour abnormality of the pancreas. Spleen: Smooth contour of the spleen, normal splenic size. Adrenals/Urinary Tract: Adrenal glands are normal. Renal contours are smooth. No hydronephrosis. No discrete calculus that can be localized to the ureter. No Peri ureteral stranding. Stomach/Bowel: Normal appendix. No signs of acute gastrointestinal  process. No pelvic sidewall lymphadenopathy. Vascular/Lymphatic: Normal caliber abdominal aorta. Limited assessment on noncontrast imaging. There is no gastrohepatic or hepatoduodenal ligament lymphadenopathy. No retroperitoneal or mesenteric lymphadenopathy. Reproductive: Uterus and adnexa with stable contour compared to previous imaging, no acute process in the pelvis Other: No ascites.  Small fat containing umbilical hernia. Musculoskeletal: No acute bone finding. No destructive bone process. IMPRESSION: 1. Question mild stranding about the pancreatic head, correlate with any clinical or laboratory evidence of pancreatitis. This could also be due to motion artifact. 2. No signs of hydronephrosis or discrete calculus that can be localized to the ureter. No secondary signs that would support ureteral calculus as well with similar pattern of calcifications due to numerous phleboliths in the pelvis when compared to remote imaging from January of 2018. 3. Normal appendix. 4. Small fat containing umbilical hernia. Electronically Signed   By: Zetta Bills M.D.   On: 07/10/2020 16:06     PMFS History: Patient Active Problem List   Diagnosis Date Noted  . Other microscopic hematuria 07/04/2020  . Numbness of upper extremity 04/19/2020  . S/P cervical spinal fusion 04/19/2020  . Cervical spinal stenosis 02/16/2020  . Gastroesophageal reflux disease without esophagitis 02/01/2020  . S/P lumbar microdiscectomy 01/04/2020  . Chronic low back pain with sciatica 08/10/2019  . Radiculopathy due to lumbar intervertebral disc disorder 06/22/2019  . Anxiety and depression 03/17/2018  . S/P endometrial ablation 06/27/2016  . Migraine without status migrainosus, not intractable 06/27/2016  . Vitamin D insufficiency 02/11/2016  . De Quervain's tenosynovitis, left 02/09/2016  . Vitamin  B12 deficiency 10/17/2015  . Allergic rhinitis 12/29/2014  . Current smoker 11/22/2014  . Obesity 06/29/2014  . Fibromyalgia  07/15/2009   Past Medical History:  Diagnosis Date  . Abdominal pain, right lower quadrant   . Anxiety   . Arthritis   . Depression   . Fibromyalgia   . GERD (gastroesophageal reflux disease)   . Migraine   . Obesity   . Peripheral vascular disease (HCC)    RIGHT LEG  . PONV (postoperative nausea and vomiting)   . PTSD (post-traumatic stress disorder)   . Stroke [facility])    many years ago - no residual  . Trichomonas 2012  . Varicose veins of leg with swelling, right    "sometimes swelling."    Family History  Problem Relation Age of Onset  . Diabetes Mother   . Hypertension Mother   . Breast cancer Mother 26  . Diabetes Father   . Hypertension Father   . Diabetes Other   . Hypertension Other   . Other Neg Hx     Past Surgical History:  Procedure Laterality Date  . ANTERIOR CERVICAL DECOMP/DISCECTOMY FUSION N/A 02/16/2020   Procedure: CERVICAL FIVE THROUGH CERVICAL SIX, CERVICAL SIX  THROUGH CERVICAL SEVEN ANTERIOR CERVICAL DECOMPRESSION/DISCECTOMY FUSION, ALLOGRAFT, PLATE;  Surgeon: Eldred Manges, MD;  Location: [facility];  Service: Orthopedics;  Laterality: N/A;  . CESAREAN SECTION    . FRACTURE SURGERY    . HYSTEROSCOPY WITH NOVASURE N/A 08/08/2014   Procedure:  NOVASURE;  Surgeon: Allie Bossier, MD;  Location: [facility];  Service: Gynecology;  Laterality: N/A;  . LUMBAR LAMINECTOMY/DECOMPRESSION MICRODISCECTOMY N/A 09/27/2019   Procedure: RIGHT LUMBAR FIVE TO SACRAL ONE MICRODISCECTOMY;  Surgeon: Eldred Manges, MD;  Location: [facility];  Service: Orthopedics;  Laterality: N/A;  . VEIN SURGERY     Social History   Occupational History  . Not on file  Tobacco Use  . Smoking status: Current Every Day Smoker    Packs/day: 1.00    Years: 5.00    Pack years: 5.00    Types: Cigarettes  . Smokeless tobacco: Never Used  . Tobacco comment: trying to quit  Vaping Use  . Vaping Use: Never used  Substance and Sexual Activity  . Alcohol use: No  . Drug use: Yes    Types: Marijuana     Comment: ocassional use for pain - 02/12/20  . Sexual activity: Yes    Birth control/protection: Condom

## 2020-07-11 NOTE — Telephone Encounter (Signed)
Please advise. Patient was seen in ED yesterday.

## 2020-07-11 NOTE — Telephone Encounter (Signed)
Order entered

## 2020-07-11 NOTE — Addendum Note (Signed)
Addended by: Rogers Seeds on: 07/11/2020 03:40 PM   Modules accepted: Orders

## 2020-07-11 NOTE — Telephone Encounter (Signed)
I called. Discussed. She turned in paper work for [employer] disability papers to [employer]. States she cannot walk, using a cane, hurts from neck to sacrum. CT abdomen neg for kidney stone or other lesion. Did have hematuria same as several months ago. She can discuss hematuria with PCP.  She would like MRI lumbar which will be with and without contrast to rule out recurrent compression. She states pain 10 plus .  Proceed with scan lumbar thanks

## 2020-07-11 NOTE — Telephone Encounter (Signed)
Patient called regarding last message, she stated she is in pain and her side is swollen she stated she cant walk she would like a call back. [phone]

## 2020-07-12 ENCOUNTER — Telehealth: Payer: Self-pay | Admitting: Orthopaedic Surgery

## 2020-07-12 ENCOUNTER — Ambulatory Visit (INDEPENDENT_AMBULATORY_CARE_PROVIDER_SITE_OTHER): Payer: BC Managed Care – PPO | Admitting: Orthopaedic Surgery

## 2020-07-12 ENCOUNTER — Encounter: Payer: Self-pay | Admitting: Orthopaedic Surgery

## 2020-07-12 VITALS — BP 149/82 | HR 96 | Ht 63.0 in | Wt 184.0 lb

## 2020-07-12 DIAGNOSIS — M545 Low back pain, unspecified: Secondary | ICD-10-CM | POA: Diagnosis not present

## 2020-07-12 DIAGNOSIS — M542 Cervicalgia: Secondary | ICD-10-CM

## 2020-07-12 MED ORDER — TRAMADOL HCL 50 MG PO TABS: 50.0000 mg | ORAL_TABLET | Freq: Four times a day (QID) | ORAL | 0 refills | Status: DC | PRN

## 2020-07-12 NOTE — Telephone Encounter (Signed)
Please advise 

## 2020-07-12 NOTE — Telephone Encounter (Signed)
Patient called. Says she can not take Tramadol. Would like something else for pain. She would like Dr. Ophelia Charter to call her. [phone]

## 2020-07-13 ENCOUNTER — Telehealth: Payer: Self-pay | Admitting: Orthopaedic Surgery

## 2020-07-13 ENCOUNTER — Telehealth: Payer: Self-pay

## 2020-07-13 NOTE — Telephone Encounter (Signed)
Patient called requesting a note states she had an appointment on yesterday. Patient is asking to be called when ready for pick up. Patient phone number is [phone].

## 2020-07-13 NOTE — Progress Notes (Signed)
Office Visit Note   Patient: Caitlin Valdez           Date of Birth: 1970-08-09           MRN: [id number] Visit Date: 07/12/2020              Requested by: Caitlin Valdez [address],  [place] [address] PCP: Caitlin Valdez   Assessment & Plan: Visit Diagnoses:  1. Low back pain, unspecified back pain laterality, unspecified chronicity, unspecified whether sciatica present   2. Neck pain     Plan: Patient requested physical therapy requested something for pain Ultram prescribed.  Will obtain new MRI scan to rule out recurrent disc problem since she states her low back is worse than the rest of her body although she has whole body pain.  Long discussion with patient that sciatica would not be given her right upper quadrant pain and she and her son continued to vocalize that she was told she had sciatica by the mobile clinic and she is also been to the emergency room and they did also mention this could be sciatica.  We will proceed with MRI scan with and without contrast to rule out lumbar disc herniation which is already been scheduled.  We will set her up for some physical therapy with that which they also requested.  Follow-Up Instructions: after lumbar MRI   Orders:  Orders Placed This Encounter  Procedures  . Ambulatory referral to Physical Therapy   Meds ordered this encounter  Medications  . traMADol (ULTRAM) 50 MG tablet    Sig: Take 1 tablet (50 mg total) by mouth every 6 (six) hours as needed.    Dispense:  30 tablet    Refill:  0      Procedures: No procedures performed   Clinical Data: No additional findings.   Subjective: Chief Complaint  Patient presents with  . Neck - Pain  . Lower Back - Pain    HPI 49 year old female returns she has had microdiscectomy 09/27/2019 with follow-up scan showing no evidence of recurrence.  She had cervical fusion C5-6 C6-7 which is solid on x-ray 02/16/2020.  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  Today she returns she is here with her son from [place] she has a walker states she cannot walk and has severe pain greater than 10 of the 10 over her entire body.  She has pain from the top of her head to her toes.  She denies numbness or tingling in her hands.  She is laying down on the exam table needs assistance from her son to get from sitting to standing can ambulate with a short gait.  Patient states she was seen by nurse practitioner or PA at a mobile clinic from [facility] and she was told that she had sciatica.  States she is having pain from the top of her head all the way to her feet she has pain that radiates around on the right side of the abdomen.  She had microscopic hematuria which been present on previous urinalysis test.  She has had history of endometriosis in the past.  CT scan was done no evidence of kidney stones were seen.  Patient her son  continued to vocalize that she was told by mobile care unit that she had sciatica.  She and her son wanted know why I had not told her she had sciatica we discussed that she did have nerve compression with sciatica before the lumbar surgery with radiculopathy and we reviewed the MRI scan after the surgery as well as the report for the surgery.  Patient been in a pain clinic in the past she  not been accepted to  return to the pain clinic due to some past bills.  Patient denies bowel bladder symptoms.  Objective: Vital Signs: BP (!) 149/82   Pulse 96   Ht 5\' 3"  (1.6 m)   Wt 184 lb (83.5 kg)   BMI 32.59 kg/m   Physical Exam Constitutional:      Appearance: She is well-developed.     Comments: Patient laying down on the exam table she needs assistance to get from sit to standing.  She complains of back pain and right side abdominal  pain.  HENT:     Head: Normocephalic.     Right Ear: External ear normal.     Left Ear: External ear normal.  Eyes:     Pupils: Pupils are equal, round, and reactive to light.  Neck:     Thyroid: No thyromegaly.     Trachea: No tracheal deviation.  Cardiovascular:     Rate and Rhythm: Normal rate.  Pulmonary:     Effort: Pulmonary effort is normal.  Abdominal:     Palpations: Abdomen is soft.  Skin:    General: Skin is warm and dry.  Neurological:     Mental Status: She is oriented to person, place, and time.  Psychiatric:        Mood and Affect: Mood and affect normal.        Behavior: Behavior normal.     Ortho Exam and ankle jerk are intact upper extremities biceps triceps brachial radialis are intact.  Well-healed lumbar and cervical incision.  Patient is assisted by her son to get from sitting standing she walks with a short stride gait of short distance using a rolling walker.  Positive Waddell signs.  No cellulitis.  No tachycardia.  Specialty Comments:  No specialty comments available.  Imaging: No results found.   PMFS History: Patient Active Problem List   Diagnosis Date Noted  . Other microscopic hematuria 07/04/2020  . Numbness of upper extremity 04/19/2020  . S/P cervical spinal fusion 04/19/2020  . Cervical spinal stenosis 02/16/2020  . Gastroesophageal reflux disease without esophagitis 02/01/2020  . S/P lumbar microdiscectomy 01/04/2020  . Chronic low back pain with sciatica 08/10/2019  . Radiculopathy due to lumbar intervertebral disc disorder 06/22/2019  . Anxiety and depression 03/17/2018  . S/P endometrial ablation 06/27/2016  . Migraine without status migrainosus, not intractable 06/27/2016  . Vitamin D insufficiency 02/11/2016  . De Quervain's tenosynovitis, left 02/09/2016  . Vitamin B12 deficiency 10/17/2015  . Allergic rhinitis 12/29/2014  . Current smoker 11/22/2014  . Obesity 06/29/2014  . Fibromyalgia 07/15/2009   Past Medical  History:  Diagnosis Date  . Abdominal pain, right lower quadrant   . Anxiety   . Arthritis   . Depression   . Fibromyalgia   . GERD (gastroesophageal reflux disease)   . Migraine   . Obesity   . Peripheral vascular disease (HCC)    RIGHT LEG  . PONV (postoperative nausea and vomiting)   . PTSD (post-traumatic stress disorder)   . Stroke [facility])  many years ago - no residual  . Trichomonas 2012  . Varicose veins of leg with swelling, right    "sometimes swelling."    Family History  Problem Relation Age of Onset  . Diabetes Mother   . Hypertension Mother   . Breast cancer Mother 68  . Diabetes Father   . Hypertension Father   . Diabetes Other   . Hypertension Other   . Other Neg Hx     Past Surgical History:  Procedure Laterality Date  . ANTERIOR CERVICAL DECOMP/DISCECTOMY FUSION N/A 02/16/2020   Procedure: CERVICAL FIVE THROUGH CERVICAL SIX, CERVICAL SIX  THROUGH CERVICAL SEVEN ANTERIOR CERVICAL DECOMPRESSION/DISCECTOMY FUSION, ALLOGRAFT, PLATE;  Surgeon: Marybelle Killings, Valdez;  Location: [place];  Service: Orthopedics;  Laterality: N/A;  . CESAREAN SECTION    . FRACTURE SURGERY    . HYSTEROSCOPY WITH NOVASURE N/A 08/08/2014   Procedure:  NOVASURE;  Surgeon: Emily Filbert, Valdez;  Location: [facility];  Service: Gynecology;  Laterality: N/A;  . LUMBAR LAMINECTOMY/DECOMPRESSION MICRODISCECTOMY N/A 09/27/2019   Procedure: RIGHT LUMBAR FIVE TO SACRAL ONE MICRODISCECTOMY;  Surgeon: Marybelle Killings, Valdez;  Location: [place];  Service: Orthopedics;  Laterality: N/A;  . VEIN SURGERY     Social History   Occupational History  . Not on file  Tobacco Use  . Smoking status: Current Every Day Smoker    Packs/day: 1.00    Years: 5.00    Pack years: 5.00    Types: Cigarettes  . Smokeless tobacco: Never Used  . Tobacco comment: trying to quit  Vaping Use  . Vaping Use: Never used  Substance and Sexual Activity  . Alcohol use: No  . Drug use: Yes    Types: Marijuana    Comment: ocassional use for  pain - 02/12/20  . Sexual activity: Yes    Birth control/protection: Condom

## 2020-07-13 NOTE — Telephone Encounter (Signed)
Note entered

## 2020-07-13 NOTE — Telephone Encounter (Signed)
Patient came in she stated she needs the correct medication sent into the pharmacy which is percocet 5 she stated she has never taken tramadol. [phone]

## 2020-07-13 NOTE — Telephone Encounter (Signed)
Sorry. Ardelia Mems to provide. I called her, she is not happy she cannot get the percocet. She is standing at our office now wanting work note RTW, no restrictions which we will provide for her.

## 2020-07-13 NOTE — Telephone Encounter (Signed)
Note provided to patient.  She also discussed some concerns that she had regarding her care with Minerva Areola, office administrator

## 2020-07-13 NOTE — Telephone Encounter (Signed)
I called patient, yesterday when seen she was on a walker.  Patient states she standing at my front desk waiting for work note that states return to work regular work no restrictions.  Patient states she cannot take the Ultram and wanted either Percocet or Norco.  I discussed with her I am unable to provide this and we do not do pain management.  Patient has been in pain management before and has been on long-term narcotics.  She did get narcotics short-term immediately after procedures.  Patient voiced that she is upset and states she is not happy I did not provide her with the narcotics that she requested.  We discussed that we provided the narcotics immediately after procedure she states she had had narcotics given at other times by other providers not related to surgical procedures .

## 2020-07-13 NOTE — Telephone Encounter (Signed)
Ok for note 

## 2020-07-13 NOTE — Telephone Encounter (Signed)
Note entered. Please print for patient. She informed Dr. Ophelia Charter she is at the office now.

## 2020-07-13 NOTE — Telephone Encounter (Signed)
noted 

## 2020-07-13 NOTE — Telephone Encounter (Signed)
Patient is asking for an emergency work note. Note also need to say return to work with no restrictions. Please see first note.

## 2020-07-13 NOTE — Telephone Encounter (Signed)
Okay to provide note the patient return to work no restrictions as she has requested.  Thank you

## 2020-07-16 NOTE — Progress Notes (Deleted)
Cardiology Office Note:    Date:  07/16/2020   ID:  Caitlin Valdez, DOB 1970/07/30, MRN [id number]  PCP:  Caitlin Stain, MD  Cardiologist:  No primary care provider on file.  Electrophysiologist:  None   Referring MD: Caitlin Stain, MD   No chief complaint on file.  History of Present Illness:    Caitlin Valdez is a 50 y.o. female with a hx of fibromyalgia, anxiety/depression, GERD, PVD, CVA who presents for follow-up.  She was referred by Dr. Chapman Fitch for preop evaluation prior to spinal surgery.  She underwent spinal surgery on 09/27/2019, and again on [id number], without complication.  She reports that she thinks she had a stroke 15 to 20 years ago, but does not remember the details.  States that she had symptoms of numbness.  She does not recall having any imaging done at that time.  She did have a brain MRI in 2014, which showed no abnormalities.  Reported history of PAD.  At clinic visit on 04/18/2020, reported she been having chest pain.  Coronary CTA was ordered but has not been done yet.  Echocardiogram on 05/05/2020 showed normal biventricular function, no significant valvular disease.  Since last clinic visit,   she reports that she has been having chest pain.  States it has been occurring over the last month.  Reports sharp pain on left-side of chest, occurs about 2-3 times per week.  Typically last for couple minutes and resolves.  Has not noted what brings on the pain, can occur at rest.  Does report she went for a 6 mile walk last week, and while had no pain while walking, did report she noticed the pain in her chest afterwards.  Does report she has been having pain in her legs with walking.  Denies any shortness of breath.  Reports intermittent lightheadedness.  No lower extremity edema.  She continues to smoke 1 pack/day.   Past Medical History:  Diagnosis Date  . Abdominal pain, right lower quadrant   . Anxiety   . Arthritis   . Depression   . Fibromyalgia   . GERD  (gastroesophageal reflux disease)   . Migraine   . Obesity   . Peripheral vascular disease (HCC)    RIGHT LEG  . PONV (postoperative nausea and vomiting)   . PTSD (post-traumatic stress disorder)   . Stroke [facility])    many years ago - no residual  . Trichomonas 2012  . Varicose veins of leg with swelling, right    "sometimes swelling."    Past Surgical History:  Procedure Laterality Date  . ANTERIOR CERVICAL DECOMP/DISCECTOMY FUSION N/A 02/16/2020   Procedure: CERVICAL FIVE THROUGH CERVICAL SIX, CERVICAL SIX  THROUGH CERVICAL SEVEN ANTERIOR CERVICAL DECOMPRESSION/DISCECTOMY FUSION, ALLOGRAFT, PLATE;  Surgeon: Caitlin Killings, MD;  Location: [place];  Service: Orthopedics;  Laterality: N/A;  . CESAREAN SECTION    . FRACTURE SURGERY    . HYSTEROSCOPY WITH NOVASURE N/A 08/08/2014   Procedure:  NOVASURE;  Surgeon: Caitlin Filbert, MD;  Location: [facility];  Service: Gynecology;  Laterality: N/A;  . LUMBAR LAMINECTOMY/DECOMPRESSION MICRODISCECTOMY N/A 09/27/2019   Procedure: RIGHT LUMBAR FIVE TO SACRAL ONE MICRODISCECTOMY;  Surgeon: Caitlin Killings, MD;  Location: [place];  Service: Orthopedics;  Laterality: N/A;  . VEIN SURGERY      Current Medications: No outpatient medications have been marked as taking for the 07/19/20 encounter (Appointment) with Caitlin Heinz, MD.     Allergies:   Latex and  Lyrica [pregabalin]   Social History   Socioeconomic History  . Marital status: Single    Spouse name: Not on file  . Number of children: Not on file  . Years of education: Not on file  . Highest education level: Not on file  Occupational History  . Not on file  Tobacco Use  . Smoking status: Current Every Day Smoker    Packs/day: 1.00    Years: 5.00    Pack years: 5.00    Types: Cigarettes  . Smokeless tobacco: Never Used  . Tobacco comment: trying to quit  Vaping Use  . Vaping Use: Never used  Substance and Sexual Activity  . Alcohol use: No  . Drug use: Yes    Types: Marijuana     Comment: ocassional use for pain - 02/12/20  . Sexual activity: Yes    Birth control/protection: Condom  Other Topics Concern  . Not on file  Social History Narrative  . Not on file   Social Determinants of Health   Financial Resource Strain: Not on file  Food Insecurity: Not on file  Transportation Needs: Not on file  Physical Activity: Not on file  Stress: Not on file  Social Connections: Not on file     Family History: The patient's family history includes Breast cancer (age of onset: 96) in her mother; Diabetes in her father, mother, and another family member; Hypertension in her father, mother, and another family member. There is no history of Other.  ROS:   Please see the history of present illness.     All other systems reviewed and are negative.  EKGs/Labs/Other Studies Reviewed:    The following studies were reviewed today:   EKG:  EKG is  ordered today.  The ekg ordered today demonstrates normal sinus rhythm, rate 72, no ST/T abnormalities  Recent Labs: 07/10/2020: ALT 15; BUN 8; Creatinine, Ser 0.81; Hemoglobin 15.2; Platelets 262; Potassium 4.3; Sodium 135  Recent Lipid Panel    Component Value Date/Time   CHOL 181 12/23/2013 1720   TRIG 103 12/23/2013 1720   HDL 75 12/23/2013 1720   CHOLHDL 2.4 12/23/2013 1720   VLDL 21 12/23/2013 1720   LDLCALC 85 12/23/2013 1720    Physical Exam:    VS:  There were no vitals taken for this visit.    Wt Readings from Last 3 Encounters:  07/12/20 184 lb (83.5 kg)  07/04/20 184 lb 6.4 oz (83.6 kg)  05/29/20 193 lb (87.5 kg)     GEN:  Well nourished, well developed in no acute distress HEENT: Normal NECK: No JVD LYMPHATICS: No lymphadenopathy CARDIAC: RRR, no murmurs, rubs, gallops RESPIRATORY:  Clear to auscultation without rales, wheezing or rhonchi  ABDOMEN: Soft, non-tender, non-distended MUSCULOSKELETAL:  No edema; No deformity  SKIN: Warm and dry NEUROLOGIC:  Alert and oriented x 3 PSYCHIATRIC:  Normal  affect   ASSESSMENT:    No diagnosis found. PLAN:    Chest pain: Atypical in description, but does have CAD risk factors (tobacco use, reported PAD) and warrants further evaluation for obstructive CAD.  Echocardiogram on 05/05/2020 showed normal biventricular function, no significant valvular disease. -Coronary CT.  Will give 50 mg metoprolol prior to exam -Echocardiogram   PAD: reported history, but ABIs normal on 06/16/2020  Tobacco use: Patient counseled on the risks of tobacco use and cessation strongly encouraged.  Will ask care guide to work with patient to assist with smoking cessation  RTC in***  Medication Adjustments/Labs and Tests Ordered: Current  medicines are reviewed at length with the patient today.  Concerns regarding medicines are outlined above.  No orders of the defined types were placed in this encounter.  No orders of the defined types were placed in this encounter.   There are no Patient Instructions on file for this visit.   Signed, Little Ishikawa, MD  07/16/2020 8:30 PM    [facility]

## 2020-07-19 ENCOUNTER — Ambulatory Visit: Payer: Self-pay | Admitting: Cardiology

## 2020-07-19 ENCOUNTER — Telehealth: Payer: Self-pay | Admitting: *Deleted

## 2020-07-19 NOTE — Telephone Encounter (Signed)
I called pt to see about getting insurance information to get PA because her insurance is showing ineligible since 07/13/20, pt states she is going to call them to see what is going on. Pt is suppose to call me back with information

## 2020-07-25 ENCOUNTER — Emergency Department (HOSPITAL_COMMUNITY)
Admission: EM | Admit: 2020-07-25 | Discharge: 2020-07-25 | Disposition: A | Discharge status: Home / Self Care | Payer: Self-pay | Attending: Emergency Medicine | Admitting: Emergency Medicine

## 2020-07-25 ENCOUNTER — Other Ambulatory Visit: Payer: Self-pay

## 2020-07-25 ENCOUNTER — Emergency Department (HOSPITAL_COMMUNITY): Payer: Self-pay

## 2020-07-25 ENCOUNTER — Encounter (HOSPITAL_COMMUNITY): Payer: Self-pay

## 2020-07-25 DIAGNOSIS — S199XXA Unspecified injury of neck, initial encounter: Secondary | ICD-10-CM | POA: Diagnosis present

## 2020-07-25 DIAGNOSIS — S161XXA Strain of muscle, fascia and tendon at neck level, initial encounter: Secondary | ICD-10-CM | POA: Diagnosis not present

## 2020-07-25 DIAGNOSIS — S39012A Strain of muscle, fascia and tendon of lower back, initial encounter: Secondary | ICD-10-CM | POA: Diagnosis not present

## 2020-07-25 DIAGNOSIS — R55 Syncope and collapse: Secondary | ICD-10-CM | POA: Diagnosis not present

## 2020-07-25 DIAGNOSIS — F17210 Nicotine dependence, cigarettes, uncomplicated: Secondary | ICD-10-CM | POA: Insufficient documentation

## 2020-07-25 DIAGNOSIS — Y92410 Unspecified street and highway as the place of occurrence of the external cause: Secondary | ICD-10-CM | POA: Diagnosis not present

## 2020-07-25 DIAGNOSIS — Z91040 Latex allergy status: Secondary | ICD-10-CM | POA: Diagnosis not present

## 2020-07-25 MED ORDER — OXYCODONE-ACETAMINOPHEN 5-325 MG PO TABS
1.0000 | ORAL_TABLET | Freq: Once | ORAL | Status: AC
  Administered 2020-07-25: 1 via ORAL
  Filled 2020-07-25: qty 1

## 2020-07-25 NOTE — ED Notes (Signed)
Pt transported to CT ?
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
## 2020-07-26 NOTE — Telephone Encounter (Signed)
Pt insurance is still showing in eligible since 07/15/20, pt was suppose to have called her insurance to get it straighten out. Have not heard back from pt

## 2020-07-27 NOTE — Telephone Encounter (Signed)
Per [facility]: I spoke to her, she is in appeal with her insurance they dropped her and shouldn't have she said. She said she would reschedule when it gets corrected.

## 2020-07-28 ENCOUNTER — Ambulatory Visit: Payer: Self-pay | Attending: Nurse Practitioner | Admitting: Nurse Practitioner

## 2020-07-28 ENCOUNTER — Other Ambulatory Visit: Payer: BC Managed Care – PPO

## 2020-07-28 ENCOUNTER — Other Ambulatory Visit: Payer: Self-pay

## 2020-07-28 ENCOUNTER — Encounter: Payer: Self-pay | Admitting: Nurse Practitioner

## 2020-07-28 VITALS — BP 104/64 | HR 82 | Temp 98.2°F | Ht 63.0 in | Wt 191.4 lb

## 2020-07-28 DIAGNOSIS — G43909 Migraine, unspecified, not intractable, without status migrainosus: Secondary | ICD-10-CM

## 2020-07-28 DIAGNOSIS — G8929 Other chronic pain: Secondary | ICD-10-CM

## 2020-07-28 DIAGNOSIS — E559 Vitamin D deficiency, unspecified: Secondary | ICD-10-CM

## 2020-07-28 DIAGNOSIS — K219 Gastro-esophageal reflux disease without esophagitis: Secondary | ICD-10-CM

## 2020-07-28 DIAGNOSIS — M5440 Lumbago with sciatica, unspecified side: Secondary | ICD-10-CM

## 2020-07-28 DIAGNOSIS — Z1211 Encounter for screening for malignant neoplasm of colon: Secondary | ICD-10-CM

## 2020-07-28 DIAGNOSIS — Z791 Long term (current) use of non-steroidal anti-inflammatories (NSAID): Secondary | ICD-10-CM

## 2020-07-28 MED ORDER — OMEPRAZOLE 40 MG PO CPDR
40.0000 mg | DELAYED_RELEASE_CAPSULE | Freq: Every day | ORAL | 3 refills | Status: DC
Start: 2020-07-28 — End: 2020-12-12

## 2020-07-28 MED ORDER — BUTALBITAL-APAP-CAFFEINE 50-325-40 MG PO TABS: 1.0000 | ORAL_TABLET | Freq: Four times a day (QID) | ORAL | 0 refills | Status: AC | PRN

## 2020-07-28 MED ORDER — METHOCARBAMOL 500 MG PO TABS
500.0000 mg | ORAL_TABLET | Freq: Three times a day (TID) | ORAL | 1 refills | Status: DC | PRN
Start: 2020-07-28 — End: 2020-12-12

## 2020-07-28 MED ORDER — VITAMIN D (ERGOCALCIFEROL) 1.25 MG (50000 UNIT) PO CAPS: 50000.0000 [IU] | ORAL_CAPSULE | ORAL | 1 refills | Status: DC
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
## 2020-07-29 IMAGING — CR DG LUMBAR SPINE 2-3V
2 series · 2 of 2 positions shown · non-contrast
Comparison: None.

CLINICAL DATA: Intraoperative localization for microdiscectomy.

EXAM:
LUMBAR SPINE - 2-3 VIEW

[lateral (1 of 2)]
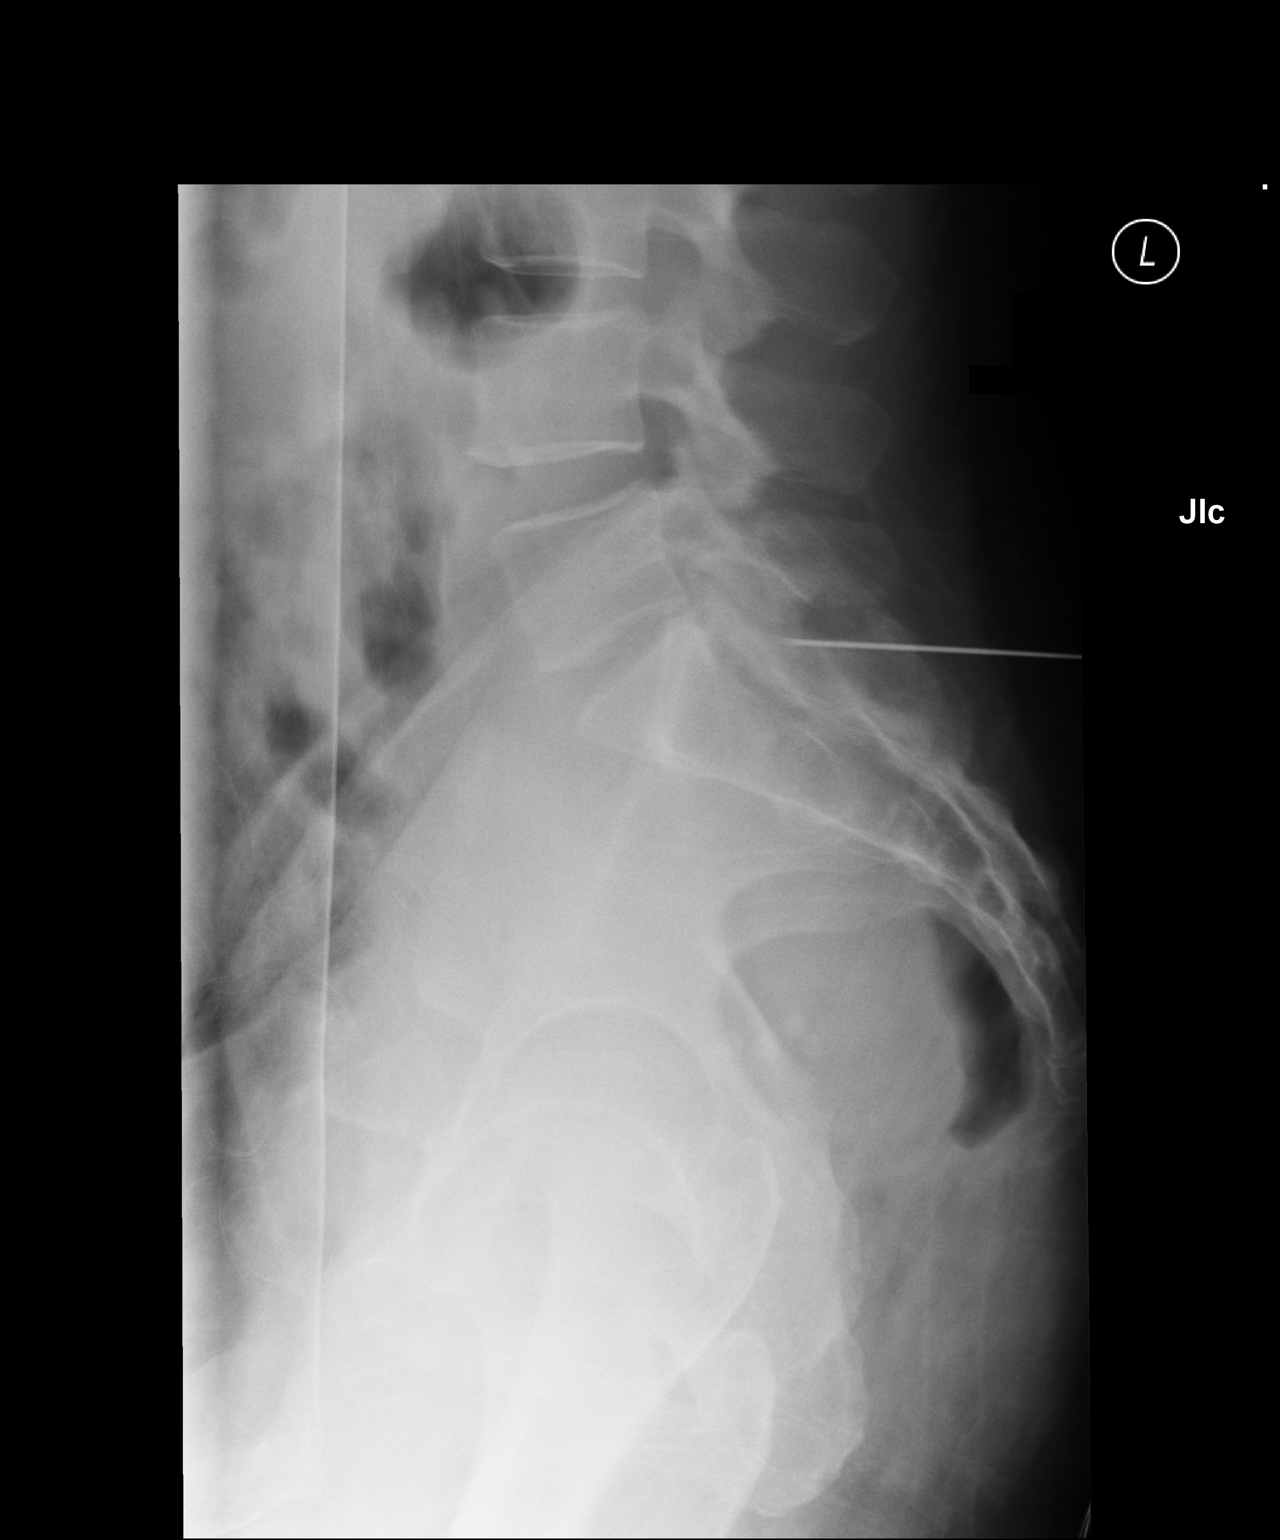

[lateral (2 of 2)]
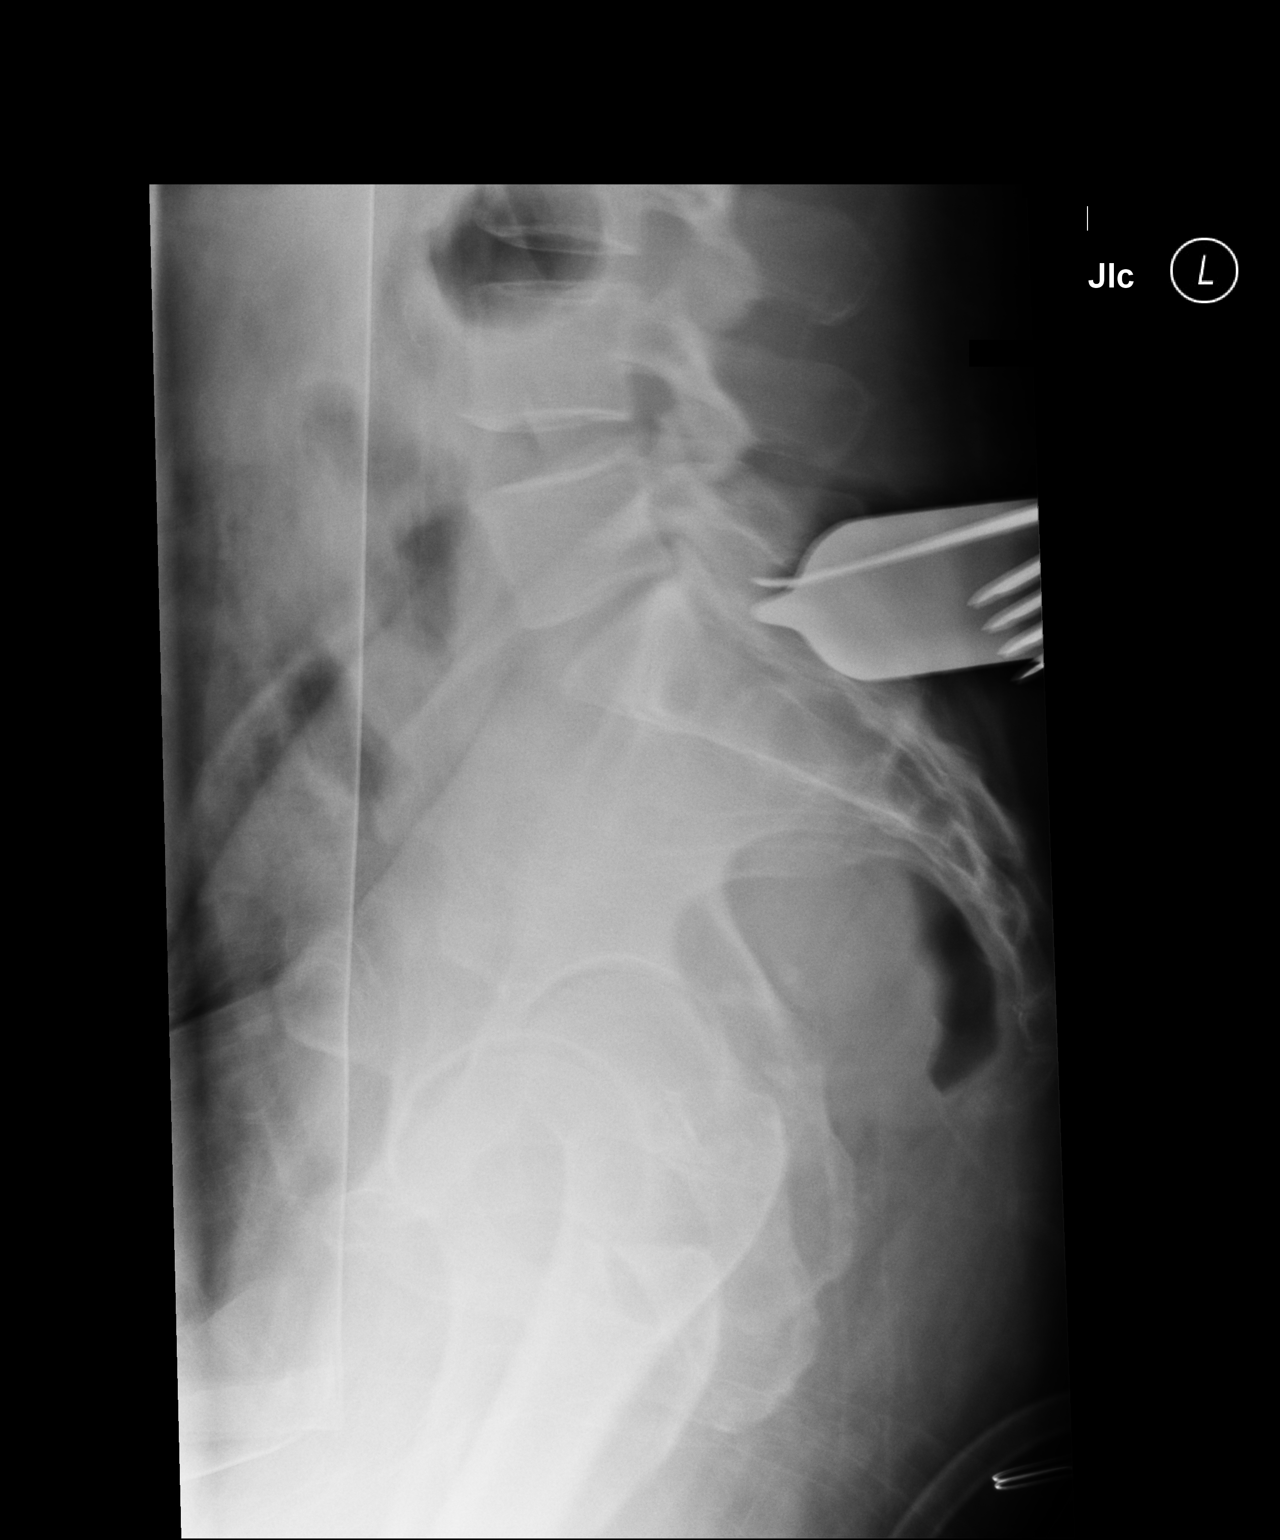

[2 of 2 positions shown; findings below may reference images not displayed]

FINDINGS: Cross-table lateral radiograph #1 shows a needle overlying the
posterior interspinous space at L5-S1.

Cross-table lateral radiograph #2 shows posterior retractors with
probe overlying the posterior interspinous space at L5-S1.
IMPRESSION: Intraoperative localization of L5-S1.

## 2020-08-04 ENCOUNTER — Encounter: Payer: BC Managed Care – PPO | Admitting: Physical Medicine and Rehabilitation

## 2020-08-04 ENCOUNTER — Encounter: Payer: Self-pay | Admitting: Physical Medicine and Rehabilitation

## 2020-08-04 ENCOUNTER — Ambulatory Visit: Payer: Self-pay | Attending: Orthopaedic Surgery | Admitting: Physical Therapy

## 2020-08-08 ENCOUNTER — Encounter: Payer: Self-pay | Admitting: Physical Medicine and Rehabilitation

## 2020-08-16 ENCOUNTER — Other Ambulatory Visit: Payer: Self-pay

## 2020-08-16 ENCOUNTER — Ambulatory Visit: Payer: Self-pay | Attending: Orthopaedic Surgery | Admitting: Physical Therapy

## 2020-08-16 ENCOUNTER — Encounter: Payer: Self-pay | Admitting: Physical Therapy

## 2020-08-16 ENCOUNTER — Ambulatory Visit
Payer: No Typology Code available for payment source | Attending: Critical Care Medicine | Admitting: Critical Care Medicine

## 2020-08-16 ENCOUNTER — Encounter: Payer: Self-pay | Admitting: Critical Care Medicine

## 2020-08-16 VITALS — BP 110/72 | HR 81 | Temp 98.7°F | Ht 63.0 in | Wt 186.0 lb

## 2020-08-16 DIAGNOSIS — M5442 Lumbago with sciatica, left side: Secondary | ICD-10-CM

## 2020-08-16 DIAGNOSIS — M5441 Lumbago with sciatica, right side: Secondary | ICD-10-CM

## 2020-08-16 DIAGNOSIS — F17200 Nicotine dependence, unspecified, uncomplicated: Secondary | ICD-10-CM

## 2020-08-16 DIAGNOSIS — Z6832 Body mass index (BMI) 32.0-32.9, adult: Secondary | ICD-10-CM

## 2020-08-16 DIAGNOSIS — G8929 Other chronic pain: Secondary | ICD-10-CM

## 2020-08-16 DIAGNOSIS — M6281 Muscle weakness (generalized): Secondary | ICD-10-CM | POA: Insufficient documentation

## 2020-08-16 DIAGNOSIS — Z1211 Encounter for screening for malignant neoplasm of colon: Secondary | ICD-10-CM

## 2020-08-16 DIAGNOSIS — R2689 Other abnormalities of gait and mobility: Secondary | ICD-10-CM | POA: Insufficient documentation

## 2020-08-16 DIAGNOSIS — E669 Obesity, unspecified: Secondary | ICD-10-CM

## 2020-08-16 DIAGNOSIS — R252 Cramp and spasm: Secondary | ICD-10-CM | POA: Insufficient documentation

## 2020-08-16 DIAGNOSIS — E559 Vitamin D deficiency, unspecified: Secondary | ICD-10-CM

## 2020-08-16 DIAGNOSIS — Z202 Contact with and (suspected) exposure to infections with a predominantly sexual mode of transmission: Secondary | ICD-10-CM

## 2020-08-16 NOTE — Assessment & Plan Note (Signed)
  .   Current smoking consumption amount: 1/2 pack a day  . Dicsussion on advise to quit smoking and smoking impacts: Cardiovascular impacts . Patient's willingness to quit: Not yet willing to quit  . Methods to quit smoking discussed: Behavioral modification  . Medication management of smoking session drugs discussed: None recommended  . Resources provided:  AVS   . Setting quit date not established  . Follow-up arranged 2 months   Time spent counseling the patient: 5 minutes  

## 2020-08-16 NOTE — Patient Instructions (Addendum)
Keep physical therapy appointment No change in medications Screening today for sexual transmitted diseases was performed along with HIV study and you will obtain a fecal kit to screen for colon cancer  Please pick up and start the vitamin D weekly  Return to see Dr. Wright 3 months 

## 2020-08-16 NOTE — Assessment & Plan Note (Signed)
Patient's lost 15 pounds I congratulated her asked her to continue to pursue weight loss

## 2020-08-16 NOTE — Assessment & Plan Note (Signed)
Patient to fill her vitamin D prescription

## 2020-08-16 NOTE — Patient Instructions (Signed)
Access Code: [email or web address] URL: [email or web address] Date: 08/16/2020 Prepared by: Amador Cunas  Exercises Supine Lower Trunk Rotation - 1 x daily - 7 x weekly - 3 sets - 10 reps - 5-10 sec hold Supine Posterior Pelvic Tilt - 1 x daily - 7 x weekly - 3 sets - 10 reps - 3 sec hold Supine Single Knee to Chest Stretch - 1 x daily - 7 x weekly - 3 sets - 5 reps - 10-15 sec hold Seated Table Hamstring Stretch - 1 x daily - 7 x weekly - 3 sets - 2 reps - 15-20 sec hold
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
## 2020-08-16 NOTE — Assessment & Plan Note (Signed)
Chronic low back pain  I recommended physical therapy be continued and I gave the patient back exercises to perform at home  I told her I cannot write for opiates at this time
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
## 2020-08-16 NOTE — Assessment & Plan Note (Signed)
We will screen with cervical vaginal swab and HIV

## 2020-08-17 LAB — CERVICOVAGINAL ANCILLARY ONLY
Bacterial Vaginitis (gardnerella): POSITIVE — AB
Candida Glabrata: NEGATIVE
Candida Vaginitis: NEGATIVE
Chlamydia: NEGATIVE
Comment: NEGATIVE
Comment: NEGATIVE
Comment: NEGATIVE
Comment: NEGATIVE
Comment: NEGATIVE
Comment: NORMAL
Neisseria Gonorrhea: NEGATIVE
Trichomonas: NEGATIVE

## 2020-08-17 LAB — HIV ANTIBODY (ROUTINE TESTING W REFLEX): HIV Screen 4th Generation wRfx: NONREACTIVE

## 2020-08-18 LAB — FECAL OCCULT BLOOD, IMMUNOCHEMICAL: Fecal Occult Bld: NEGATIVE

## 2020-08-21 ENCOUNTER — Other Ambulatory Visit: Payer: Self-pay | Admitting: Critical Care Medicine

## 2020-08-21 ENCOUNTER — Telehealth: Payer: Self-pay | Admitting: Critical Care Medicine

## 2020-08-21 DIAGNOSIS — N760 Acute vaginitis: Secondary | ICD-10-CM

## 2020-08-21 DIAGNOSIS — B9689 Other specified bacterial agents as the cause of diseases classified elsewhere: Secondary | ICD-10-CM

## 2020-08-21 MED ORDER — METRONIDAZOLE 500 MG PO TABS: 500.0000 mg | ORAL_TABLET | Freq: Three times a day (TID) | ORAL | 0 refills | Status: AC

## 2020-08-21 NOTE — Progress Notes (Signed)
Flagyl sent to [facility] for BV

## 2020-08-21 NOTE — Telephone Encounter (Signed)
Copied from [place] [id number]. Topic: Quick Communication - See Telephone Encounter >> Aug 21, 2020  9:02 AM Loma [place] wrote: CRM for notification. See Telephone encounter for: 08/21/20. Pt has seen and being treated  by Dr Joya Gaskins,  He has submitted FMLA papers, [employer] on release form states # 5  to be filled out, pt  wants the nurse to cb as she is wanting specific date for return with further discussion [phone]

## 2020-08-22 ENCOUNTER — Other Ambulatory Visit: Payer: Self-pay

## 2020-08-22 ENCOUNTER — Encounter: Payer: Self-pay | Admitting: Physical Therapy

## 2020-08-22 ENCOUNTER — Ambulatory Visit: Payer: Self-pay | Admitting: Physical Therapy

## 2020-08-22 DIAGNOSIS — G8929 Other chronic pain: Secondary | ICD-10-CM

## 2020-08-22 DIAGNOSIS — M6281 Muscle weakness (generalized): Secondary | ICD-10-CM

## 2020-08-22 DIAGNOSIS — R2689 Other abnormalities of gait and mobility: Secondary | ICD-10-CM

## 2020-08-22 DIAGNOSIS — R252 Cramp and spasm: Secondary | ICD-10-CM

## 2020-08-22 DIAGNOSIS — M5441 Lumbago with sciatica, right side: Secondary | ICD-10-CM

## 2020-08-22 NOTE — Therapy (Signed)
[place]. [place], [place], [address] Phone: [phone]   Fax:  [phone]  Physical Therapy Treatment  Patient Details  Name: Caitlin Valdez MRN: [id number] Date of Birth: September 26, 1970 Referring Provider (PT): Chaney Born Date: 08/22/2020   PT End of Session - 08/22/20 1551    Visit Number 2    Date for PT Re-Evaluation 10/14/20    PT Start Time 1515    PT Stop Time 1550    PT Time Calculation (min) 35 min    Activity Tolerance Patient tolerated treatment well    Behavior During Therapy [facility] for tasks assessed/performed           Past Medical History:  Diagnosis Date  . Abdominal pain, right lower quadrant   . Anxiety   . Arthritis   . Depression   . Fibromyalgia   . GERD (gastroesophageal reflux disease)   . Migraine   . Obesity   . Peripheral vascular disease (HCC)    RIGHT LEG  . PONV (postoperative nausea and vomiting)   . PTSD (post-traumatic stress disorder)   . Stroke [facility])    many years ago - no residual  . Trichomonas 2012  . Varicose veins of leg with swelling, right    "sometimes swelling."    Past Surgical History:  Procedure Laterality Date  . ANTERIOR CERVICAL DECOMP/DISCECTOMY FUSION N/A 02/16/2020   Procedure: CERVICAL FIVE THROUGH CERVICAL SIX, CERVICAL SIX  THROUGH CERVICAL SEVEN ANTERIOR CERVICAL DECOMPRESSION/DISCECTOMY FUSION, ALLOGRAFT, PLATE;  Surgeon: Marybelle Killings, MD;  Location: [place];  Service: Orthopedics;  Laterality: N/A;  . CESAREAN SECTION    . FRACTURE SURGERY    . HYSTEROSCOPY WITH NOVASURE N/A 08/08/2014   Procedure:  NOVASURE;  Surgeon: Emily Filbert, MD;  Location: [place] [facility];  Service: Gynecology;  Laterality: N/A;  . LUMBAR LAMINECTOMY/DECOMPRESSION MICRODISCECTOMY N/A 09/27/2019   Procedure: RIGHT LUMBAR FIVE TO SACRAL ONE MICRODISCECTOMY;  Surgeon: Marybelle Killings, MD;  Location: [place];  Service: Orthopedics;  Laterality: N/A;  . VEIN SURGERY      There were  no vitals filed for this visit.   Subjective Assessment - 08/22/20 1519    Subjective "I am always in pain"    Currently in Pain? Yes    Pain Score 8     Pain Location --   entire body                            [facility] Adult PT Treatment/Exercise - 08/22/20 0001      Exercises   Exercises Neck      Neck Exercises: Standing   Other Standing Exercises Shoulder flex & Abd 2lb 2x10      Lumbar Exercises: Aerobic   Nustep L4 x 6 min      Lumbar Exercises: Standing   Row Strengthening;Theraband;20 reps;Both    Theraband Level (Row) Level 2 (Red)    Shoulder Extension Theraband;20 reps;Both;Strengthening    Theraband Level (Shoulder Extension) Level 2 (Red)      Lumbar Exercises: Seated   Other Seated Lumbar Exercises ER red 2x10    Other Seated Lumbar Exercises Cervical retraction 2x10 2 sec                    PT Short Term Goals - 08/16/20 1757      PT SHORT TERM GOAL #1   Title Pt will be I  with initial HEP    Time 2    Period Weeks    Status New    Target Date 08/30/20             PT Long Term Goals - 08/16/20 1757      PT LONG TERM GOAL #1   Title Pt will be I with advanced HEP    Time 6    Period Weeks    Status New    Target Date 09/27/20      PT LONG TERM GOAL #2   Title Pt will demo lumbar AROM WFL with no reports of increased LBP    Time 6    Period Weeks    Status New    Target Date 09/28/20      PT LONG TERM GOAL #3   Title Pt will report resolution of radiating pain in RLE    Time 6    Period Weeks    Status New    Target Date 09/28/20      PT LONG TERM GOAL #4   Title 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 Tactile cues for arm placement  needed with external rotation. She has some UE burning with standing flexion and abduction. Posterior cervical tightness reports with cervical retractions.    Examination-Activity Limitations Bend;Locomotion Level;Stand;Stairs;Lift    Examination-Participation Restrictions Occupation;Community Activity;Interpersonal Relationship    Stability/Clinical Decision Making Evolving/Moderate complexity    Rehab Potential Good    PT Frequency 2x / week    PT Treatment/Interventions ADLs/Self Care Home Management;Electrical Stimulation;Iontophoresis 4mg /ml Dexamethasone;Moist Heat;Neuromuscular re-education;Therapeutic exercise;Therapeutic activities;Stair training;Patient/family education;Manual techniques;Dry needling;Passive range of motion    PT Next Visit Plan gentle progression to strength ex's, LE flexibility ex's, manual/modalities as indicated           Patient will benefit from skilled therapeutic intervention in order to improve the following deficits and impairments:  Abnormal gait,Difficulty walking,Decreased range of motion,Increased muscle spasms,Pain,Hypomobility,Impaired flexibility,Decreased strength,Postural dysfunction  Visit Diagnosis: Cramp and spasm  Muscle weakness (generalized)  Chronic bilateral low back pain with right-sided sciatica  Other abnormalities of gait and mobility     Problem List Patient Active Problem List   Diagnosis Date Noted  . Other microscopic hematuria 07/04/2020  . Numbness of upper extremity 04/19/2020  . S/P cervical spinal fusion 04/19/2020  . Cervical spinal stenosis 02/16/2020  . Gastroesophageal reflux disease without esophagitis 02/01/2020  . S/P lumbar microdiscectomy 01/04/2020  . Chronic low back pain with sciatica 08/10/2019  . Radiculopathy due to lumbar intervertebral disc disorder 06/22/2019  . Anxiety and depression 03/17/2018  . Bacterial vaginitis 02/25/2017  . S/P endometrial ablation 06/27/2016  . Migraine without  status migrainosus, not intractable 06/27/2016  . Vitamin D insufficiency 02/11/2016  . De Quervain's tenosynovitis, left 02/09/2016  . Vitamin B12 deficiency 10/17/2015  . Allergic rhinitis 12/29/2014  . Current smoker 11/22/2014  . Obesity 06/29/2014  . Exposure to STD 05/09/2014  . Fibromyalgia 07/15/2009    Scot Jun, PTA 08/22/2020, 3:56 PM  [facility] [address]  [place]. [place], [place], [address] Phone: [phone]   Fax:  [phone]  Name: Caitlin Valdez MRN: [id number] Date of Birth: Feb 17, 1971

## 2020-08-22 NOTE — Telephone Encounter (Signed)
Noted.  I am out of town after Friday until Feb 21 FYI

## 2020-08-23 ENCOUNTER — Telehealth: Payer: Self-pay | Admitting: Critical Care Medicine

## 2020-08-23 ENCOUNTER — Encounter: Payer: Self-pay | Admitting: Critical Care Medicine

## 2020-08-23 ENCOUNTER — Encounter (INDEPENDENT_AMBULATORY_CARE_PROVIDER_SITE_OTHER): Payer: Self-pay | Admitting: *Deleted

## 2020-08-23 NOTE — Telephone Encounter (Signed)
Patient dropped off forms to be filled out and requests a call from nurse.

## 2020-08-23 NOTE — Telephone Encounter (Signed)
See other message

## 2020-08-23 NOTE — Telephone Encounter (Signed)
Noted   See attached letter to amend item 5 on orig form filled out in December

## 2020-08-24 ENCOUNTER — Ambulatory Visit: Payer: Self-pay | Admitting: Physical Therapy

## 2020-08-24 NOTE — Telephone Encounter (Signed)
Copy of forms made and faxed to [phone].  Patient informed forms are available at the front desk. Given office hours.

## 2020-08-28 ENCOUNTER — Ambulatory Visit: Payer: Self-pay

## 2020-08-30 ENCOUNTER — Ambulatory Visit: Payer: Self-pay

## 2020-09-04 ENCOUNTER — Ambulatory Visit: Payer: Self-pay

## 2020-09-04 ENCOUNTER — Encounter: Payer: Self-pay | Admitting: *Deleted

## 2020-09-04 ENCOUNTER — Other Ambulatory Visit: Payer: Self-pay

## 2020-09-04 DIAGNOSIS — M6281 Muscle weakness (generalized): Secondary | ICD-10-CM

## 2020-09-04 DIAGNOSIS — R2689 Other abnormalities of gait and mobility: Secondary | ICD-10-CM

## 2020-09-04 DIAGNOSIS — G8929 Other chronic pain: Secondary | ICD-10-CM

## 2020-09-04 DIAGNOSIS — R252 Cramp and spasm: Secondary | ICD-10-CM

## 2020-09-04 NOTE — Therapy (Signed)
[place]. [place], [place], [address] Phone: [phone]   Fax:  [phone]  Physical Therapy Treatment  Patient Details  Name: Caitlin Valdez MRN: [id number] Date of Birth: 1971/03/18 Referring Provider (PT): Chaney Born Date: 09/04/2020   PT End of Session - 09/04/20 1725    Visit Number 3    Date for PT Re-Evaluation 10/14/20    PT Start Time 1700    PT Stop Time 1745    PT Time Calculation (min) 45 min           Past Medical History:  Diagnosis Date  . Abdominal pain, right lower quadrant   . Anxiety   . Arthritis   . Depression   . Fibromyalgia   . GERD (gastroesophageal reflux disease)   . Migraine   . Obesity   . Peripheral vascular disease (HCC)    RIGHT LEG  . PONV (postoperative nausea and vomiting)   . PTSD (post-traumatic stress disorder)   . Stroke [facility])    many years ago - no residual  . Trichomonas 2012  . Varicose veins of leg with swelling, right    "sometimes swelling."  . Vitamin D deficiency     Past Surgical History:  Procedure Laterality Date  . ANTERIOR CERVICAL DECOMP/DISCECTOMY FUSION N/A 02/16/2020   Procedure: CERVICAL FIVE THROUGH CERVICAL SIX, CERVICAL SIX  THROUGH CERVICAL SEVEN ANTERIOR CERVICAL DECOMPRESSION/DISCECTOMY FUSION, ALLOGRAFT, PLATE;  Surgeon: Marybelle Killings, MD;  Location: [place];  Service: Orthopedics;  Laterality: N/A;  . CESAREAN SECTION    . FRACTURE SURGERY    . HYSTEROSCOPY WITH NOVASURE N/A 08/08/2014   Procedure:  NOVASURE;  Surgeon: Emily Filbert, MD;  Location: [place] [facility];  Service: Gynecology;  Laterality: N/A;  . LUMBAR LAMINECTOMY/DECOMPRESSION MICRODISCECTOMY N/A 09/27/2019   Procedure: RIGHT LUMBAR FIVE TO SACRAL ONE MICRODISCECTOMY;  Surgeon: Marybelle Killings, MD;  Location: [place];  Service: Orthopedics;  Laterality: N/A;  . VEIN SURGERY      There were no vitals filed for this visit.   Subjective Assessment - 09/04/20 1720    Subjective  "This weekend was hectic"    Pertinent History hx of cervical fusion (2021) and lumbar discectomy (2021)    Limitations Sitting;Standing;Walking    How long can you walk comfortably? 10-15 min    Diagnostic tests xrays lumbar spine    Patient Stated Goals return to PLOF    Currently in Pain? Yes    Pain Score 7     Pain Location --   Left side of neck and low back                            [facility] Adult PT Treatment/Exercise - 09/04/20 0001      Transfers   Five time sit to stand comments  12.99 seconds, no pain      Exercises   Exercises Neck;Lumbar      Neck Exercises: Standing   Other Standing Exercises Shoulder flex & Abd 2lb 2x10    Other Standing Exercises Standing shoulder extension and rows red TB 10 x 2      Lumbar Exercises: Stretches   Active Hamstring Stretch Right;Left;1 rep;20 seconds   seated   Single Knee to Chest Stretch Right;Left;1 rep;10 seconds    Lower Trunk Rotation 5 reps;10 seconds   10 reps B   Other Lumbar Stretch Exercise Seated QL stretch 30  sec B      Lumbar Exercises: Aerobic   Nustep L4 x 6 min      Lumbar Exercises: Seated   Other Seated Lumbar Exercises ER red 2x10    Other Seated Lumbar Exercises Cervical retraction 2x10 2 sec      Lumbar Exercises: Supine   Bridge 10 reps   2 sets     Manual Therapy   Manual Therapy Soft tissue mobilization    Manual therapy comments Neck: left upper trap, levator scap, proximal SCM, B suboccipitals.  lumbar:  parapsinals, left Q/L. Multiple tender points responded well to sustained pressure.      Neck Exercises: Stretches   Upper Trapezius Stretch 2 reps;30 seconds;Right;Left    Levator Stretch Left;Right;2 reps;20 seconds                    PT Short Term Goals - 08/16/20 1757      PT SHORT TERM GOAL #1   Title Pt will be I with initial HEP    Time 2    Period Weeks    Status New    Target Date 08/30/20             PT Long Term Goals - 08/16/20 1757       PT LONG TERM GOAL #1   Title Pt will be I with advanced HEP    Time 6    Period Weeks    Status New    Target Date 09/27/20      PT LONG TERM GOAL #2   Title Pt will demo lumbar AROM WFL with no reports of increased LBP    Time 6    Period Weeks    Status New    Target Date 09/28/20      PT LONG TERM GOAL #3   Title Pt will report resolution of radiating pain in RLE    Time 6    Period Weeks    Status New    Target Date 09/28/20      PT LONG TERM GOAL #4   Title 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 Reports getting some back spasms at home and got one on the right mid back (palpable) with [facility] stretch, relieved with bridging. Responded nicely to manual therapy to neck and lumbar, multiple tender points/points of spasm that decreased post.  Educated her on trying home exercises/stretches before getting out of bed to start the day to help with tightness/spasms pt in agreement. Continue per POC.    Examination-Activity Limitations Bend;Locomotion Level;Stand;Stairs;Lift    Examination-Participation Restrictions Occupation;Community Activity;Interpersonal Relationship    Rehab Potential Good    PT Frequency 2x / week    PT Duration 6 weeks    PT Treatment/Interventions ADLs/Self Care Home Management;Electrical Stimulation;Iontophoresis 4mg /ml Dexamethasone;Moist Heat;Neuromuscular re-education;Therapeutic exercise;Therapeutic activities;Stair training;Patient/family education;Manual techniques;Dry needling;Passive range of motion    PT Next Visit Plan gentle progression to strength ex's, LE flexibility ex's, manual/modalities as indicated    PT Home Exercise Plan see pt instructions    Consulted and Agree with Plan of Care Patient  Patient will benefit from skilled therapeutic intervention in order to improve the following deficits and impairments:  Abnormal gait,Difficulty walking,Decreased range of motion,Increased muscle spasms,Pain,Hypomobility,Impaired flexibility,Decreased strength,Postural dysfunction  Visit Diagnosis: Cramp and spasm  Muscle weakness (generalized)  Chronic bilateral low back pain with right-sided sciatica  Other abnormalities of gait and mobility     Problem List Patient Active Problem List   Diagnosis Date Noted  . Other microscopic hematuria 07/04/2020  . Numbness of upper extremity 04/19/2020  . S/P cervical spinal fusion 04/19/2020  . Cervical spinal stenosis 02/16/2020  . Gastroesophageal reflux disease without esophagitis 02/01/2020  . S/P lumbar microdiscectomy 01/04/2020  . Chronic low back pain with sciatica 08/10/2019  . Radiculopathy due to lumbar intervertebral disc disorder 06/22/2019  . Anxiety and depression 03/17/2018  . Bacterial vaginitis 02/25/2017  . S/P endometrial ablation 06/27/2016  . Migraine without status migrainosus, not intractable 06/27/2016  . Vitamin D insufficiency 02/11/2016  . De Quervain's tenosynovitis, left 02/09/2016  . Vitamin B12 deficiency 10/17/2015  . Allergic rhinitis 12/29/2014  . Current smoker 11/22/2014  . Obesity 06/29/2014  . Exposure to STD 05/09/2014  . Fibromyalgia 07/15/2009    Hall Busing, PT, DPT 09/04/2020, 5:48 PM  [place]. [place], [place], [address] Phone: [phone]   Fax:  [phone]  Name: Caitlin Valdez MRN: [id number] Date of Birth: May 02, 1971

## 2020-09-06 ENCOUNTER — Other Ambulatory Visit: Payer: Self-pay

## 2020-09-06 ENCOUNTER — Ambulatory Visit: Payer: Self-pay

## 2020-09-06 DIAGNOSIS — R2689 Other abnormalities of gait and mobility: Secondary | ICD-10-CM

## 2020-09-06 DIAGNOSIS — G8929 Other chronic pain: Secondary | ICD-10-CM

## 2020-09-06 DIAGNOSIS — R252 Cramp and spasm: Secondary | ICD-10-CM

## 2020-09-06 DIAGNOSIS — M5441 Lumbago with sciatica, right side: Secondary | ICD-10-CM

## 2020-09-06 DIAGNOSIS — M6281 Muscle weakness (generalized): Secondary | ICD-10-CM

## 2020-09-06 NOTE — Therapy (Signed)
[place]. [place], [place], [address] Phone: [phone]   Fax:  [phone]  Physical Therapy Treatment  Patient Details  Name: Caitlin Valdez MRN: [id number] Date of Birth: [birth year] Referring Provider (PT): Chaney Born Date: 09/06/2020   PT End of Session - 09/06/20 1709    Visit Number 4    Date for PT Re-Evaluation 10/14/20    PT Start Time 1700    PT Stop Time 1740    PT Time Calculation (min) 40 min    Activity Tolerance Patient tolerated treatment well    Behavior During Therapy [facility] for tasks assessed/performed           Past Medical History:  Diagnosis Date  . Abdominal pain, right lower quadrant   . Anxiety   . Arthritis   . Depression   . Fibromyalgia   . GERD (gastroesophageal reflux disease)   . Migraine   . Obesity   . Peripheral vascular disease (HCC)    RIGHT LEG  . PONV (postoperative nausea and vomiting)   . PTSD (post-traumatic stress disorder)   . Stroke [facility])    many years ago - no residual  . Trichomonas 2012  . Varicose veins of leg with swelling, right    "sometimes swelling."  . Vitamin D deficiency     Past Surgical History:  Procedure Laterality Date  . ANTERIOR CERVICAL DECOMP/DISCECTOMY FUSION N/A 02/16/2020   Procedure: CERVICAL FIVE THROUGH CERVICAL SIX, CERVICAL SIX  THROUGH CERVICAL SEVEN ANTERIOR CERVICAL DECOMPRESSION/DISCECTOMY FUSION, ALLOGRAFT, PLATE;  Surgeon: Marybelle Killings, MD;  Location: [place];  Service: Orthopedics;  Laterality: N/A;  . CESAREAN SECTION    . FRACTURE SURGERY    . HYSTEROSCOPY WITH NOVASURE N/A 08/08/2014   Procedure:  NOVASURE;  Surgeon: Emily Filbert, MD;  Location: [facility] [facility];  Service: Gynecology;  Laterality: N/A;  . LUMBAR LAMINECTOMY/DECOMPRESSION MICRODISCECTOMY N/A 09/27/2019   Procedure: RIGHT LUMBAR FIVE TO SACRAL ONE MICRODISCECTOMY;  Surgeon: Marybelle Killings, MD;  Location: [place];  Service: Orthopedics;  Laterality: N/A;  . VEIN  SURGERY      There were no vitals filed for this visit.   Subjective Assessment - 09/06/20 1707    Subjective Felt looser after last PT session, slept good. Had increased back spasms today at work today    Pertinent History hx of cervical fusion (2021) and lumbar discectomy (2021)    Limitations Sitting;Standing;Walking    How long can you walk comfortably? 10-15 min    Diagnostic tests xrays lumbar spine    Patient Stated Goals return to PLOF    Currently in Pain? Yes    Pain Score 7     Pain Location --   Left neck back and both legs   Pain Descriptors / Indicators Aching;Sore;Spasm                  [facility] Adult PT Treatment/Exercise - 09/06/20 0001      Neck Exercises: Theraband   Shoulder Extension 10 reps;Red    Rows 10 reps;Red      Neck Exercises: Standing   Other Standing Exercises Shoulder flex & Abd 2lb 2x10      Lumbar Exercises: Stretches   Active Hamstring Stretch Right;Left;1 rep;20 seconds    Lower Trunk Rotation 5 reps;10 seconds    Other Lumbar Stretch Exercise Seated QL stretch 30 sec x 2 B. Seated trunk rotation with breathing 3 B    Other  Lumbar Stretch Exercise Seated pball rollouts x 10 FF, R and L      Lumbar Exercises: Aerobic   Nustep L5 x 6 min      Lumbar Exercises: Supine   Bridge 20 reps    Basic Lumbar Stabilization --   Supine marches x 10 alternating     Modalities   Modalities Moist Heat      Moist Heat Therapy   Number Minutes Moist Heat 8 Minutes    Moist Heat Location --   L thoracolumbar , end of session     Manual Therapy   Manual Therapy Soft tissue mobilization    Manual therapy comments lumbar:  parapsinals, left QL, intercostals. Multiple tender points responded well to sustained pressure.      Neck Exercises: Stretches   Upper Trapezius Stretch 2 reps;30 seconds;Right;Left    Levator Stretch Left;Right;2 reps;20 seconds                    PT Short Term Goals - 08/16/20 1757      PT SHORT TERM GOAL  #1   Title Pt will be I with initial HEP    Time 2    Period Weeks    Status New    Target Date 08/30/20             PT Long Term Goals - 08/16/20 1757      PT LONG TERM GOAL #1   Title Pt will be I with advanced HEP    Time 6    Period Weeks    Status New    Target Date 09/27/20      PT LONG TERM GOAL #2   Title Pt will demo lumbar AROM WFL with no reports of increased LBP    Time 6    Period Weeks    Status New    Target Date 09/28/20      PT LONG TERM GOAL #3   Title Pt will report resolution of radiating pain in RLE    Time 6    Period Weeks    Status New    Target Date 09/28/20      PT LONG TERM GOAL #4   Title 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 No increased spasms or pain with exercises, mainly muscle fatigue.  increased spasms at proximal QL, lumbar paraspinals, and left lower intercostals. Decreased ms spasms post stretching and manual tx  today. Continue per POC.    Examination-Activity Limitations Bend;Locomotion Level;Stand;Stairs;Lift    Examination-Participation Restrictions Occupation;Community Activity;Interpersonal Relationship    Rehab Potential Good    PT Frequency 2x / week    PT Duration 6 weeks    PT Treatment/Interventions ADLs/Self Care Home Management;Electrical Stimulation;Iontophoresis 4mg /ml Dexamethasone;Moist Heat;Neuromuscular re-education;Therapeutic exercise;Therapeutic activities;Stair training;Patient/family education;Manual techniques;Dry needling;Passive range of motion    PT Next Visit Plan gentle progression to strength ex's, LE flexibility ex's, manual/modalities as indicated    Consulted and Agree with Plan of Care Patient           Patient will benefit from skilled therapeutic intervention  in order to improve the  following deficits and impairments:  Abnormal gait,Difficulty walking,Decreased range of motion,Increased muscle spasms,Pain,Hypomobility,Impaired flexibility,Decreased strength,Postural dysfunction  Visit Diagnosis: Cramp and spasm  Muscle weakness (generalized)  Chronic bilateral low back pain with right-sided sciatica  Other abnormalities of gait and mobility     Problem List Patient Active Problem List   Diagnosis Date Noted  . Other microscopic hematuria 07/04/2020  . Numbness of upper extremity 04/19/2020  . S/P cervical spinal fusion 04/19/2020  . Cervical spinal stenosis 02/16/2020  . Gastroesophageal reflux disease without esophagitis 02/01/2020  . S/P lumbar microdiscectomy 01/04/2020  . Chronic low back pain with sciatica 08/10/2019  . Radiculopathy due to lumbar intervertebral disc disorder 06/22/2019  . Anxiety and depression 03/17/2018  . Bacterial vaginitis 02/25/2017  . S/P endometrial ablation 06/27/2016  . Migraine without status migrainosus, not intractable 06/27/2016  . Vitamin D insufficiency 02/11/2016  . De Quervain's tenosynovitis, left 02/09/2016  . Vitamin B12 deficiency 10/17/2015  . Allergic rhinitis 12/29/2014  . Current smoker 11/22/2014  . Obesity 06/29/2014  . Exposure to STD 05/09/2014  . Fibromyalgia 07/15/2009    Hall Busing , PT, DPT 09/06/2020, 5:42 PM  [place]. [place], [place], [address] Phone: [phone]   Fax:  [phone]  Name: Caitlin Valdez MRN: [id number] Date of Birth: 09/17/1970

## 2020-09-07 ENCOUNTER — Telehealth: Payer: Self-pay | Admitting: Critical Care Medicine

## 2020-09-07 NOTE — Telephone Encounter (Signed)
This encounter can be disregarded. Patient called in reference to forms and letter Dr. Joya Gaskins filled out and was faxed on 2/10. Patient asked for forms to be refaxed which is done. Patient should be coming by tomorrow morning to get the paperwork.

## 2020-09-08 ENCOUNTER — Encounter: Payer: Self-pay | Admitting: Physical Medicine and Rehabilitation

## 2020-09-11 ENCOUNTER — Ambulatory Visit: Payer: Self-pay

## 2020-09-13 ENCOUNTER — Ambulatory Visit: Payer: Self-pay

## 2020-09-18 ENCOUNTER — Ambulatory Visit: Payer: Self-pay

## 2020-09-20 ENCOUNTER — Other Ambulatory Visit: Payer: Self-pay

## 2020-09-20 ENCOUNTER — Ambulatory Visit: Payer: Self-pay | Attending: Orthopaedic Surgery

## 2020-09-20 DIAGNOSIS — R252 Cramp and spasm: Secondary | ICD-10-CM

## 2020-09-20 DIAGNOSIS — M6281 Muscle weakness (generalized): Secondary | ICD-10-CM

## 2020-09-20 DIAGNOSIS — G8929 Other chronic pain: Secondary | ICD-10-CM

## 2020-09-20 DIAGNOSIS — M5441 Lumbago with sciatica, right side: Secondary | ICD-10-CM | POA: Insufficient documentation

## 2020-09-20 DIAGNOSIS — R2689 Other abnormalities of gait and mobility: Secondary | ICD-10-CM

## 2020-09-20 NOTE — Therapy (Signed)
[facility] [place], [place], [address] Phone: [phone]   Fax:  [phone]  Physical Therapy Treatment  Patient Details  Name: Caitlin Valdez MRN: [id number] Date of Birth: [birth year] Referring Provider (PT): Chaney Born Date: 09/20/2020   PT End of Session - 09/20/20 1726    Visit Number 5    Date for PT Re-Evaluation 10/14/20    PT Start Time 1702    PT Stop Time 1730    PT Time Calculation (min) 28 min    Activity Tolerance Patient tolerated treatment well    Behavior During Therapy [facility] for tasks assessed/performed           Past Medical History:  Diagnosis Date  . Abdominal pain, right lower quadrant   . Anxiety   . Arthritis   . Depression   . Fibromyalgia   . GERD (gastroesophageal reflux disease)   . Migraine   . Obesity   . Peripheral vascular disease (HCC)    RIGHT LEG  . PONV (postoperative nausea and vomiting)   . PTSD (post-traumatic stress disorder)   . Stroke [facility])    many years ago - no residual  . Trichomonas 2012  . Varicose veins of leg with swelling, right    "sometimes swelling."  . Vitamin D deficiency     Past Surgical History:  Procedure Laterality Date  . ANTERIOR CERVICAL DECOMP/DISCECTOMY FUSION N/A 02/16/2020   Procedure: CERVICAL FIVE THROUGH CERVICAL SIX, CERVICAL SIX  THROUGH CERVICAL SEVEN ANTERIOR CERVICAL DECOMPRESSION/DISCECTOMY FUSION, ALLOGRAFT, PLATE;  Surgeon: Marybelle Killings, MD;  Location: [facility];  Service: Orthopedics;  Laterality: N/A;  . CESAREAN SECTION    . FRACTURE SURGERY    . HYSTEROSCOPY WITH NOVASURE N/A 08/08/2014   Procedure:  NOVASURE;  Surgeon: Emily Filbert, MD;  Location: [facility];  Service: Gynecology;  Laterality: N/A;  . LUMBAR LAMINECTOMY/DECOMPRESSION MICRODISCECTOMY N/A 09/27/2019   Procedure: RIGHT LUMBAR FIVE TO SACRAL ONE MICRODISCECTOMY;  Surgeon: Marybelle Killings, MD;  Location: [place];  Service: Orthopedics;  Laterality: N/A;  . VEIN  SURGERY      There were no vitals filed for this visit.   Subjective Assessment - 09/20/20 1702    Subjective Pain has ben much worse. 10/10 today left neck into shoulder and throughout back. has felt sick all day.    Pertinent History hx of cervical fusion (2021) and lumbar discectomy (2021)    Limitations Sitting;Standing;Walking    Patient Stated Goals return to PLOF    Currently in Pain? Yes    Pain Score 10-Worst pain ever            [facility] Adult PT Treatment/Exercise - 09/20/20 1705      Lumbar Exercises: Stretches   Lower Trunk Rotation 5 reps;10 seconds      Lumbar Exercises: Seated   Other Seated Lumbar Exercises seated ball squeeze 15" x 4      Lumbar Exercises: Supine   Other Supine Lumbar Exercises DKTC with orange pball 2 x10      Lumbar Exercises: Prone   Other Prone Lumbar Exercises crocodile breaths x 10. mini cobra prone pushup x 10. prone on elbows 30 sec x 3      Manual Therapy   Manual Therapy Soft tissue mobilization    Manual therapy comments lumbar:  parapsinals, left QL, intercostals. Multiple tender points responded well to sustained pressure.  PT Short Term Goals - 08/16/20 1757      PT SHORT TERM GOAL #1   Title Pt will be I with initial HEP    Time 2    Period Weeks    Status New    Target Date 08/30/20             PT Long Term Goals - 08/16/20 1757      PT LONG TERM GOAL #1   Title Pt will be I with advanced HEP    Time 6    Period Weeks    Status New    Target Date 09/27/20      PT LONG TERM GOAL #2   Title Pt will demo lumbar AROM WFL with no reports of increased LBP    Time 6    Period Weeks    Status New    Target Date 09/28/20      PT LONG TERM GOAL #3   Title Pt will report resolution of radiating pain in RLE    Time 6    Period Weeks    Status New    Target Date 09/28/20      PT LONG TERM GOAL #4   Title 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 She presented withsignificant right sided low back and left neck pain today which she said has worsened past few days - worse with weather and recent stress with car troubles. Session with focus on gentle stretching, education on stretching with lumbar extension bias at home to relieve pain, and manual tx    Examination-Activity Limitations Bend;Locomotion Level;Stand;Stairs;Lift    Examination-Participation Restrictions Occupation;Community Activity;Interpersonal Relationship    Rehab Potential Good    PT Frequency 2x / week    PT Duration 6 weeks    PT Treatment/Interventions ADLs/Self Care Home Management;Electrical Stimulation;Iontophoresis 4mg /ml Dexamethasone;Moist Heat;Neuromuscular re-education;Therapeutic exercise;Therapeutic activities;Stair training;Patient/family education;Manual techniques;Dry needling;Passive range of motion    PT Next Visit Plan gentle progression to strength ex's, LE flexibility ex's, manual/modalities as indicated    Consulted and Agree with Plan of Care Patient           Patient will benefit from skilled therapeutic intervention in order to improve the following deficits and impairments:  Abnormal gait,Difficulty walking,Decreased range of motion,Increased muscle spasms,Pain,Hypomobility,Impaired flexibility,Decreased strength,Postural dysfunction  Visit Diagnosis: Cramp and spasm  Muscle weakness (generalized)  Chronic bilateral low back pain with right-sided sciatica  Other abnormalities of gait and mobility     Problem List Patient Active Problem List   Diagnosis Date Noted  . Other microscopic hematuria 07/04/2020  . Numbness of upper extremity 04/19/2020  . S/P cervical spinal fusion 04/19/2020  . Cervical spinal stenosis  02/16/2020  . Gastroesophageal reflux disease without esophagitis 02/01/2020  . S/P lumbar microdiscectomy 01/04/2020  . Chronic low back pain with sciatica 08/10/2019  . Radiculopathy due to lumbar intervertebral disc disorder 06/22/2019  . Anxiety and depression 03/17/2018  . Bacterial vaginitis 02/25/2017  . S/P endometrial ablation 06/27/2016  . Migraine without status migrainosus, not intractable 06/27/2016  . Vitamin D insufficiency 02/11/2016  . Tennis Must  Quervain's tenosynovitis, left 02/09/2016  . Vitamin B12 deficiency 10/17/2015  . Allergic rhinitis 12/29/2014  . Current smoker 11/22/2014  . Obesity 06/29/2014  . Exposure to STD 05/09/2014  . Fibromyalgia 07/15/2009    Hall Busing, PT, DPT 09/20/2020, 5:41 PM  [place]. [place], [place], [address] Phone: [phone]   Fax:  [phone]  Name: Caitlin Valdez MRN: [id number] Date of Birth: 1971-03-01

## 2020-09-22 ENCOUNTER — Ambulatory Visit: Payer: Self-pay | Admitting: Internal Medicine

## 2020-09-22 NOTE — Telephone Encounter (Signed)
Pt was calling regarding her forms, please advise.

## 2020-09-25 ENCOUNTER — Ambulatory Visit: Payer: Self-pay

## 2020-09-28 NOTE — Telephone Encounter (Signed)
Called patient to follow up on forms. [place] spoke with patient 09/07/20 advising her forms had been refaxed and were ready for pickup. Galin and I remember patient coming to pick them up the next day or shortly after.   Left voicemail for patient advising her that I was calling to follow up on her forms and if she had already picked them up she could disregard her message. I asked her if she had other questions or concerns to please follow up with [place] regarding those at [phone].

## 2020-10-02 ENCOUNTER — Ambulatory Visit: Payer: Self-pay

## 2020-10-03 ENCOUNTER — Telehealth: Payer: Self-pay | Admitting: Orthopaedic Surgery

## 2020-10-03 NOTE — Telephone Encounter (Signed)
Pt called stating she has went back to work and would like a back brace; she would like a CB from [employer].  [phone]

## 2020-10-03 NOTE — Telephone Encounter (Signed)
OK for lumbar corset.

## 2020-10-03 NOTE — Telephone Encounter (Signed)
I called pt and advised and she stated understanding

## 2020-10-03 NOTE — Telephone Encounter (Signed)
Do you recommend a back brace for this pt? If so anything specific?

## 2020-10-04 ENCOUNTER — Ambulatory Visit: Payer: Self-pay

## 2020-10-09 ENCOUNTER — Ambulatory Visit: Payer: Self-pay

## 2020-10-11 ENCOUNTER — Ambulatory Visit: Payer: Self-pay | Admitting: Physical Therapy

## 2020-10-16 ENCOUNTER — Ambulatory Visit: Payer: Self-pay | Admitting: Physical Therapy

## 2020-10-18 ENCOUNTER — Other Ambulatory Visit: Payer: Self-pay

## 2020-10-18 ENCOUNTER — Ambulatory Visit: Payer: Self-pay | Attending: Orthopaedic Surgery | Admitting: Physical Therapy

## 2020-10-18 ENCOUNTER — Encounter: Payer: Self-pay | Admitting: Physical Therapy

## 2020-10-18 DIAGNOSIS — R252 Cramp and spasm: Secondary | ICD-10-CM | POA: Insufficient documentation

## 2020-10-18 DIAGNOSIS — M6281 Muscle weakness (generalized): Secondary | ICD-10-CM | POA: Insufficient documentation

## 2020-10-18 DIAGNOSIS — G8929 Other chronic pain: Secondary | ICD-10-CM | POA: Insufficient documentation

## 2020-10-18 DIAGNOSIS — R2689 Other abnormalities of gait and mobility: Secondary | ICD-10-CM | POA: Insufficient documentation

## 2020-10-18 DIAGNOSIS — M5441 Lumbago with sciatica, right side: Secondary | ICD-10-CM | POA: Insufficient documentation

## 2020-10-18 NOTE — Therapy (Signed)
[place]. [place], [place], [address] Phone: [phone]   Fax:  [phone]  Physical Therapy Treatment  Patient Details  Name: Caitlin Valdez MRN: [id number] Date of Birth: [birth year] Referring Provider (PT): Chaney Born Date: 10/18/2020   PT End of Session - 10/18/20 0804    Visit Number 6    Date for PT Re-Evaluation 11/29/20    PT Start Time 0804    PT Stop Time 5427    PT Time Calculation (min) 40 min    Activity Tolerance Patient tolerated treatment well    Behavior During Therapy [facility] for tasks assessed/performed           Past Medical History:  Diagnosis Date  . Abdominal pain, right lower quadrant   . Anxiety   . Arthritis   . Depression   . Fibromyalgia   . GERD (gastroesophageal reflux disease)   . Migraine   . Obesity   . Peripheral vascular disease (HCC)    RIGHT LEG  . PONV (postoperative nausea and vomiting)   . PTSD (post-traumatic stress disorder)   . Stroke [facility])    many years ago - no residual  . Trichomonas 2012  . Varicose veins of leg with swelling, right    "sometimes swelling."  . Vitamin D deficiency     Past Surgical History:  Procedure Laterality Date  . ANTERIOR CERVICAL DECOMP/DISCECTOMY FUSION N/A 02/16/2020   Procedure: CERVICAL FIVE THROUGH CERVICAL SIX, CERVICAL SIX  THROUGH CERVICAL SEVEN ANTERIOR CERVICAL DECOMPRESSION/DISCECTOMY FUSION, ALLOGRAFT, PLATE;  Surgeon: Marybelle Killings, MD;  Location: [facility];  Service: Orthopedics;  Laterality: N/A;  . CESAREAN SECTION    . FRACTURE SURGERY    . HYSTEROSCOPY WITH NOVASURE N/A 08/08/2014   Procedure:  NOVASURE;  Surgeon: Emily Filbert, MD;  Location: [facility];  Service: Gynecology;  Laterality: N/A;  . LUMBAR LAMINECTOMY/DECOMPRESSION MICRODISCECTOMY N/A 09/27/2019   Procedure: RIGHT LUMBAR FIVE TO SACRAL ONE MICRODISCECTOMY;  Surgeon: Marybelle Killings, MD;  Location: [facility];  Service: Orthopedics;  Laterality: N/A;  . VEIN  SURGERY      There were no vitals filed for this visit.   Subjective Assessment - 10/18/20 0805    Subjective Pain is bad all over. 8/10 in the back. she has a back brace and knee brace.    Pertinent History hx of cervical fusion (2021) and lumbar discectomy (2021)    Patient Stated Goals return to PLOF    Currently in Pain? Yes                             [facility] Adult PT Treatment/Exercise - 10/18/20 0001      Lumbar Exercises: Standing   Functional Squats 5 reps    Functional Squats Limitations at mat table    Other Standing Lumbar Exercises wall slides x 15; dead lift 10# 2x 10, attempted standing lunges but pt with quad weakness      Lumbar Exercises: Supine   Bridge 5 reps    Bridge with clamshell 10 reps    Other Supine Lumbar Exercises SLR with ab set x 10 ea      Lumbar Exercises: Prone   Straight Leg Raise 10 reps    Straight Leg Raises Limitations bil    Other Prone Lumbar Exercises scap retraction 90/90 x 10, superman x 5 5 sec hold      Lumbar Exercises:  Quadruped   Plank 4 reps max hold                    PT Short Term Goals - 10/18/20 1713      PT SHORT TERM GOAL #1   Title Pt will be I with initial HEP    Status Achieved             PT Long Term Goals - 10/18/20 1716      PT LONG TERM GOAL #1   Title Pt will be I with advanced HEP    Status On-going      PT LONG TERM GOAL #2   Title Pt will demo lumbar AROM WFL with no reports of increased LBP    Status On-going      PT LONG TERM GOAL #3   Title Pt will report resolution of radiating pain in RLE    Status On-going      PT LONG TERM GOAL #4   Title Pt will report 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 She purchased a lumbar brace last night and was wearing it today. We discussed using the brace only as needed for pain and working more on strengthening. She did well with new TE including dead lifts and planks. She reported no change in pain at end of session however. She reports compliance with her HEP. LTGs are ongoing.    PT Frequency 2x / week    PT Duration 6 weeks    PT Treatment/Interventions ADLs/Self Care Home Management;Electrical Stimulation;Iontophoresis 4mg /ml Dexamethasone;Moist Heat;Neuromuscular re-education;Therapeutic exercise;Therapeutic activities;Stair training;Patient/family education;Manual techniques;Dry needling;Passive range of motion    PT Next Visit Plan gentle progression to strength ex's, LE flexibility ex's, manual/modalities as indicated    Consulted and Agree with Plan of Care Patient           Patient will benefit from skilled therapeutic intervention in order to improve the following deficits and impairments:  Abnormal gait,Difficulty walking,Decreased range of motion,Increased muscle spasms,Pain,Hypomobility,Impaired flexibility,Decreased strength,Postural dysfunction  Visit Diagnosis: Cramp and spasm - Plan: PT plan of care cert/re-cert  Muscle weakness (generalized) - Plan: PT plan of care cert/re-cert  Chronic bilateral low back pain with right-sided sciatica - Plan: PT plan of care cert/re-cert  Other abnormalities of gait and mobility - Plan: PT plan of care cert/re-cert     Problem List Patient Active Problem List   Diagnosis Date Noted  . Other microscopic hematuria 07/04/2020  . Numbness of upper extremity 04/19/2020  . S/P cervical spinal fusion 04/19/2020  . Cervical spinal stenosis 02/16/2020  . Gastroesophageal reflux disease without esophagitis 02/01/2020  . S/P lumbar microdiscectomy 01/04/2020  . Chronic low back pain with sciatica 08/10/2019  . Radiculopathy due to lumbar intervertebral disc disorder 06/22/2019  .  Anxiety and depression 03/17/2018  . Bacterial vaginitis 02/25/2017  . S/P endometrial ablation 06/27/2016  . Migraine without status migrainosus, not intractable 06/27/2016  . Vitamin D insufficiency 02/11/2016  . De Quervain's tenosynovitis, left 02/09/2016  . Vitamin B12 deficiency 10/17/2015  . Allergic rhinitis 12/29/2014  . Current smoker 11/22/2014  .  Obesity 06/29/2014  . Exposure to STD 05/09/2014  . Fibromyalgia 07/15/2009   Madelyn Flavors PT 10/18/2020, 5:20 PM  [place]. [place], [place], [address] Phone: [phone]   Fax:  [phone]  Name: Caitlin Valdez MRN: [id number] Date of Birth: 1970-10-31

## 2020-10-25 ENCOUNTER — Ambulatory Visit: Payer: Self-pay | Admitting: Physical Therapy

## 2020-10-30 ENCOUNTER — Ambulatory Visit: Payer: Self-pay | Admitting: Physical Therapy

## 2020-10-31 ENCOUNTER — Ambulatory Visit: Payer: Self-pay | Admitting: Physical Therapy

## 2020-11-01 ENCOUNTER — Ambulatory Visit: Payer: Self-pay | Admitting: Physical Therapy

## 2020-11-02 ENCOUNTER — Ambulatory Visit: Payer: Self-pay | Admitting: Physical Therapy

## 2020-11-02 IMAGING — MR MR LUMBAR SPINE WO/W CM
4 of 7 series · 18 of 48 positions shown · IV contrast (multihance)
Comparison: MR lumbar spine 04/30/2019

CLINICAL DATA: Right L5-S1 microdiscectomy. Persistent radicular
pain.

EXAM:
MRI LUMBAR SPINE WITHOUT AND WITH CONTRAST
TECHNIQUE: Multiplanar and multiecho pulse sequences of the lumbar spine were
obtained without and with intravenous contrast.
CONTRAST:  17mL MULTIHANCE GADOBENATE DIMEGLUMINE 529 MG/ML IV SOLN

[Series 5: T1 · sagittal · 4.0mm · 0.73mm/px · 3 of 13 slices shown (1 of 2)]
[im 1/13]
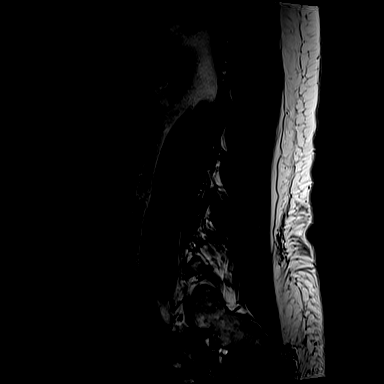
[im 7/13]
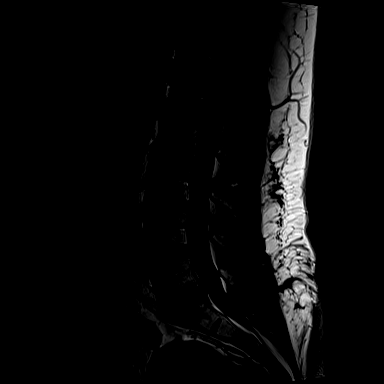
[im 13/13]
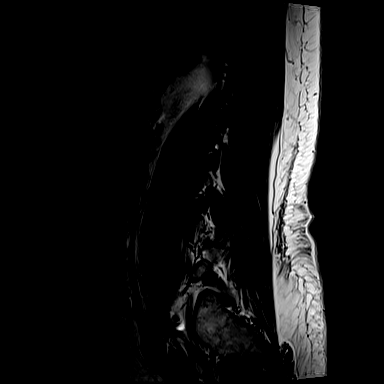

[Series 9: T1 · axial · 4.0mm · 0.28mm/px · z∈[-109,+68]mm · 3 of 40 slices shown (2 of 2)]
[im 4/40]
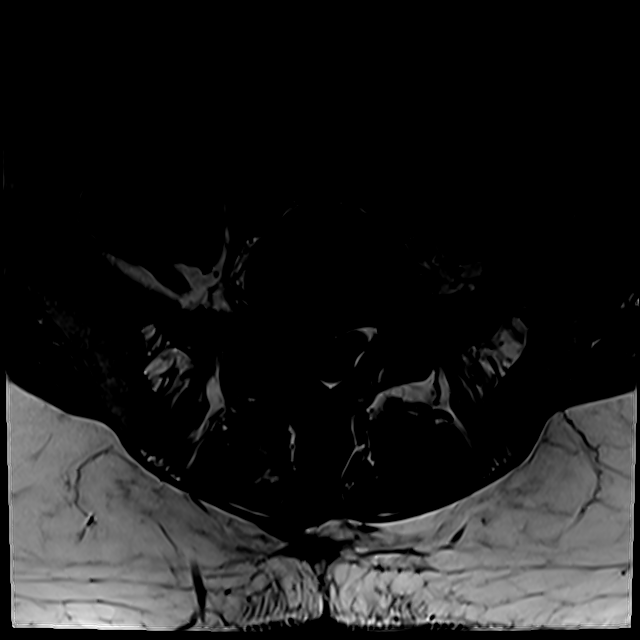
[im 20/40]
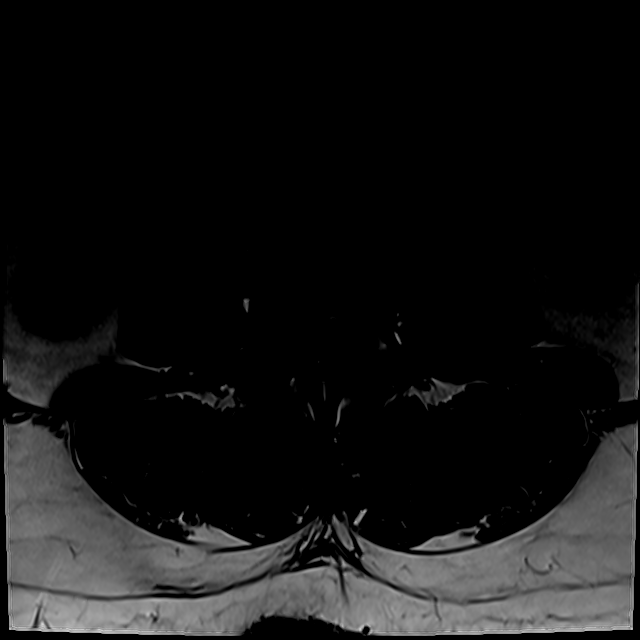
[im 36/40]
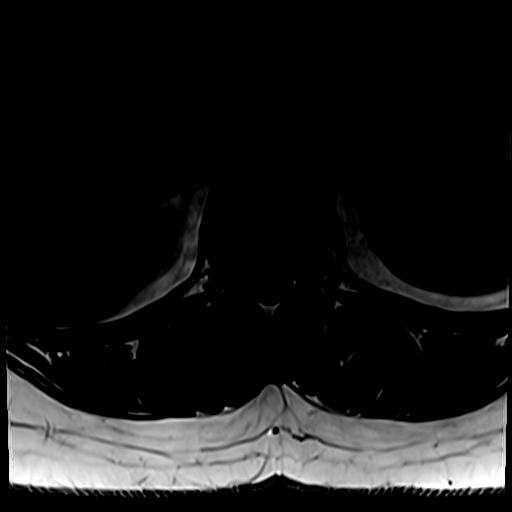

[Series 12: T2 · axial · 4.0mm · 0.28mm/px · z∈[-124,+68]mm · 9 of 40 slices shown (1 of 2)]
[im 1/40]
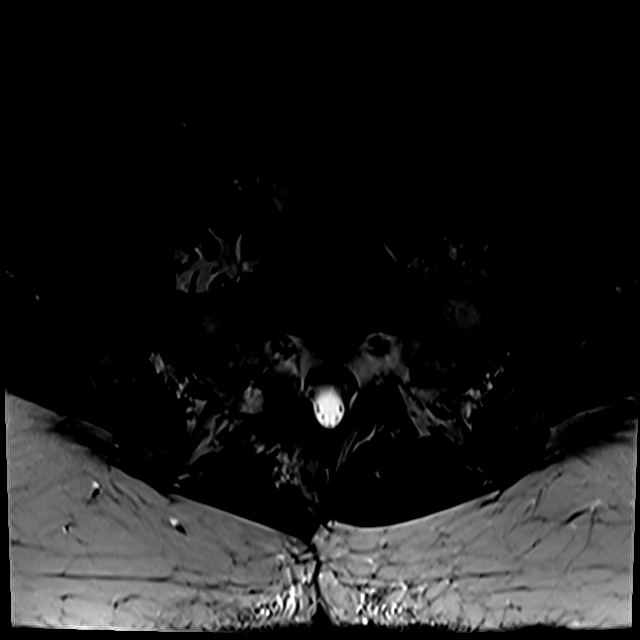
[im 4/40]
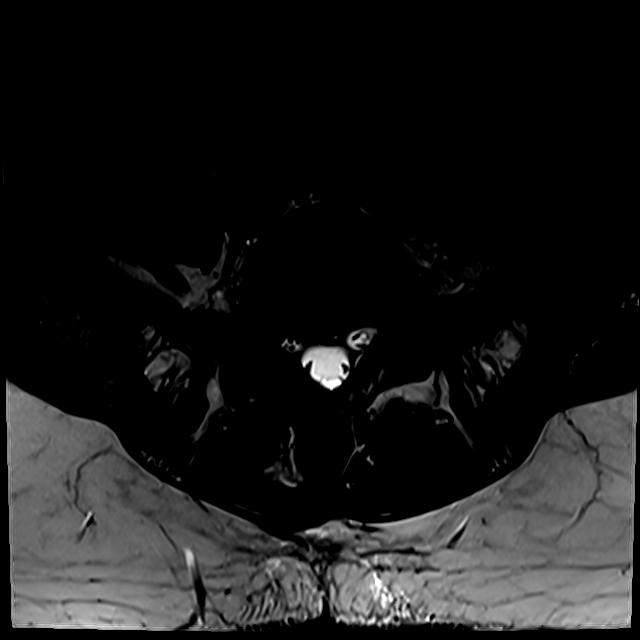
[im 8/40]
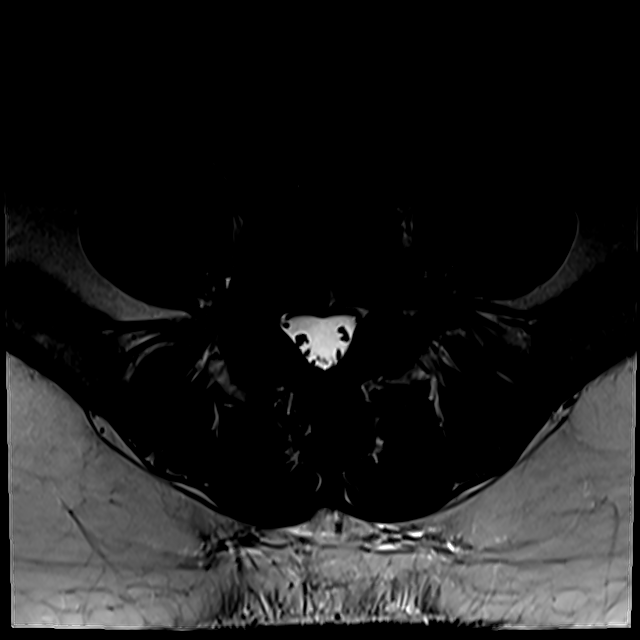
[im 12/40]
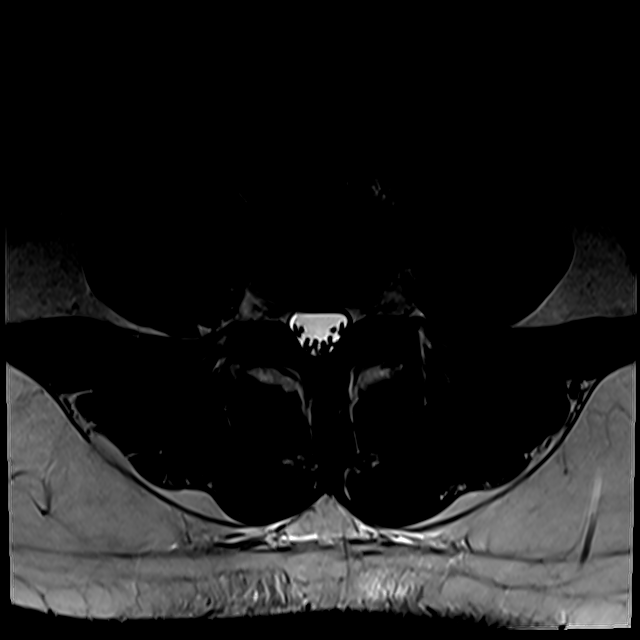
[im 16/40]
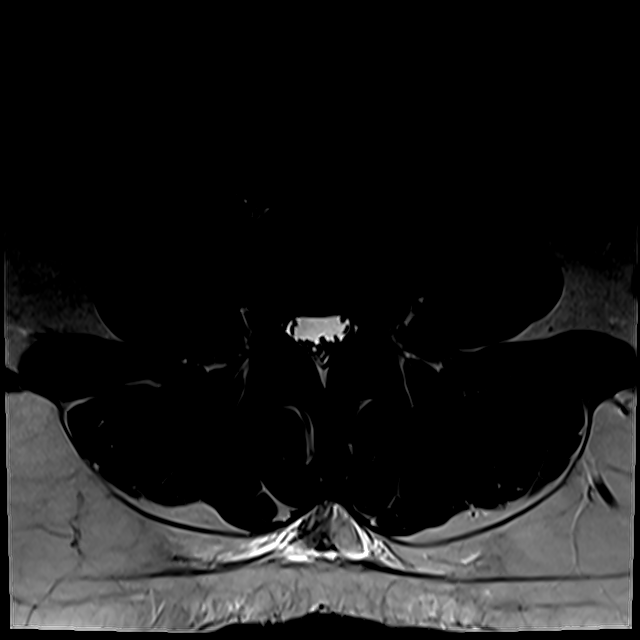
[im 20/40]
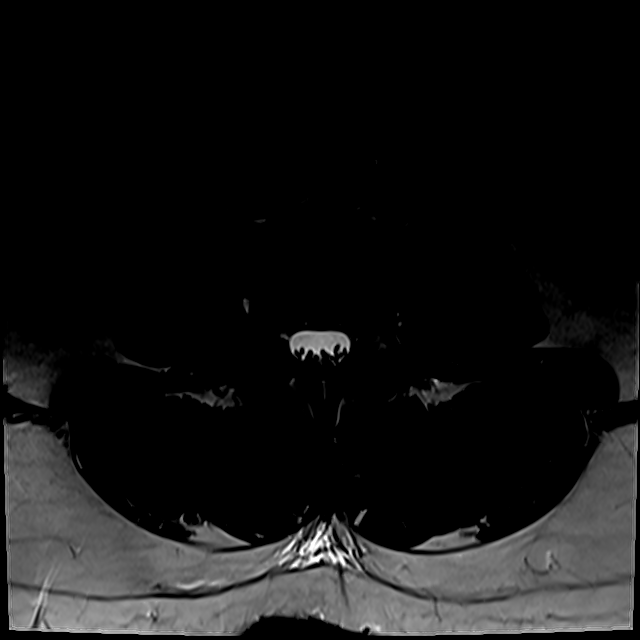
[im 24/40]
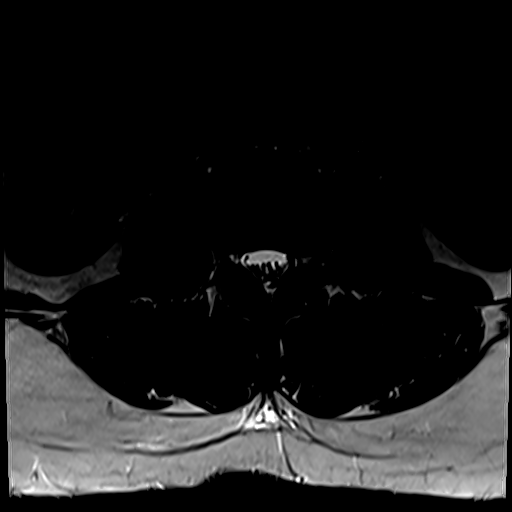
[im 28/40]
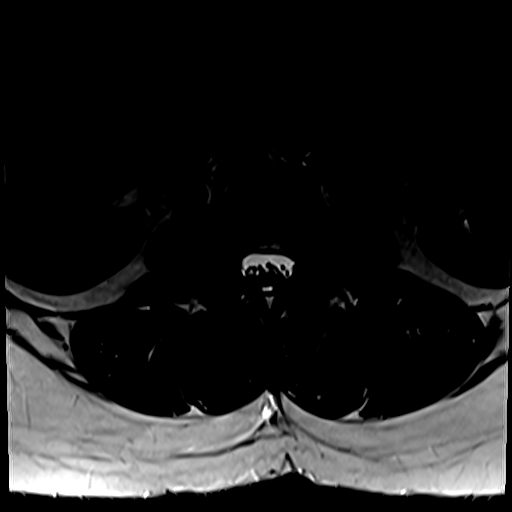
[im 36/40]
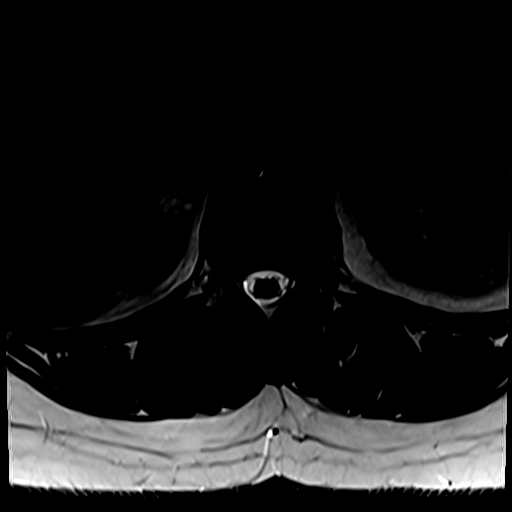

[Series 13: T2 · sagittal · 4.0mm · 0.73mm/px · 3 of 13 slices shown (2 of 2)]
[im 1/13]
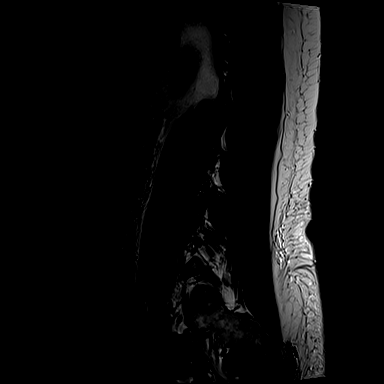
[im 9/13]
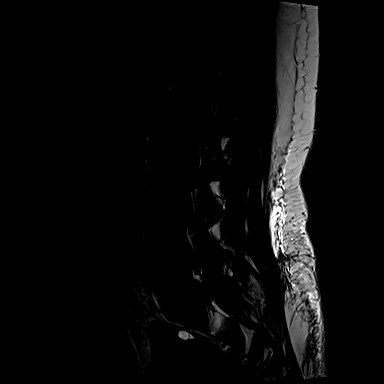
[im 13/13]
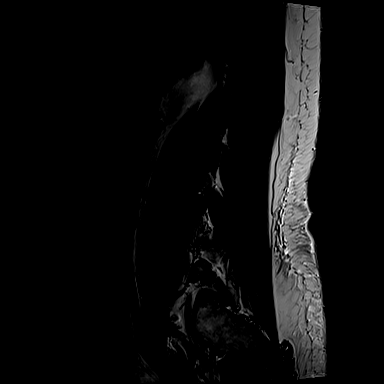

[18 of 48 positions shown; findings below may reference images not displayed]

FINDINGS: Segmentation: 5 non rib-bearing lumbar type vertebral bodies are
present. The lowest fully formed vertebral body is L5.

Alignment:  No significant listhesis is present.

Vertebrae: Minimal endplate changes are present at L5-S1. Marrow
signal and vertebral body heights are otherwise normal.

Conus medullaris and cauda equina: Conus extends to the L2 level.
Conus appears normal. The right S1 nerve root is enhancing. It is
not enlarged.

Paraspinal and other soft tissues: Limited imaging the abdomen is
unremarkable. There is no significant adenopathy. No solid organ
lesions are present.

Disc levels:

This levels at L3-4 and above are normal.

L4-5: Mild facet hypertrophy is again noted. No focal protrusion or
stenosis is present.

L5-S1: Right laminectomy and microdiscectomy are noted. No residual
or recurrent disc protrusion or focal stenosis is present.
Granulation tissue is present on the right. The right S1 nerve root
is enhancing.
IMPRESSION: 1. Postoperative changes at L5-S1 without evidence for residual or
recurrent disc protrusion or stenosis.
2. Enhancement of the right S1 nerve root may be related to
inflammation.
3. No other significant disc disease or stenosis.

## 2020-11-06 ENCOUNTER — Ambulatory Visit: Payer: Self-pay | Admitting: Physical Therapy

## 2020-11-07 ENCOUNTER — Ambulatory Visit: Payer: Self-pay | Admitting: Physical Therapy

## 2020-11-09 ENCOUNTER — Ambulatory Visit: Payer: Self-pay | Admitting: Physical Therapy

## 2020-11-10 ENCOUNTER — Encounter (HOSPITAL_COMMUNITY): Payer: Self-pay

## 2020-11-10 ENCOUNTER — Emergency Department (HOSPITAL_COMMUNITY)
Admission: EM | Admit: 2020-11-10 | Discharge: 2020-11-10 | Disposition: A | Discharge status: Home / Self Care | Payer: BC Managed Care – PPO | Attending: Emergency Medicine | Admitting: Emergency Medicine

## 2020-11-10 ENCOUNTER — Emergency Department (HOSPITAL_COMMUNITY): Payer: BC Managed Care – PPO

## 2020-11-10 ENCOUNTER — Ambulatory Visit: Payer: Self-pay | Admitting: *Deleted

## 2020-11-10 ENCOUNTER — Other Ambulatory Visit: Payer: Self-pay

## 2020-11-10 DIAGNOSIS — F17210 Nicotine dependence, cigarettes, uncomplicated: Secondary | ICD-10-CM | POA: Diagnosis not present

## 2020-11-10 DIAGNOSIS — Z202 Contact with and (suspected) exposure to infections with a predominantly sexual mode of transmission: Secondary | ICD-10-CM

## 2020-11-10 DIAGNOSIS — N898 Other specified noninflammatory disorders of vagina: Secondary | ICD-10-CM | POA: Diagnosis not present

## 2020-11-10 DIAGNOSIS — G43909 Migraine, unspecified, not intractable, without status migrainosus: Secondary | ICD-10-CM | POA: Diagnosis not present

## 2020-11-10 DIAGNOSIS — G43809 Other migraine, not intractable, without status migrainosus: Secondary | ICD-10-CM

## 2020-11-10 DIAGNOSIS — Z91040 Latex allergy status: Secondary | ICD-10-CM | POA: Diagnosis not present

## 2020-11-10 DIAGNOSIS — R519 Headache, unspecified: Secondary | ICD-10-CM | POA: Diagnosis present

## 2020-11-10 LAB — CBC WITH DIFFERENTIAL/PLATELET
Abs Immature Granulocytes: 0.02 10*3/uL (ref 0.00–0.07)
Basophils Absolute: 0 10*3/uL (ref 0.0–0.1)
Basophils Relative: 0 %
Eosinophils Absolute: 0.1 10*3/uL (ref 0.0–0.5)
Eosinophils Relative: 2 %
HCT: 40.3 % (ref 36.0–46.0)
Hemoglobin: 13.5 g/dL (ref 12.0–15.0)
Immature Granulocytes: 0 %
Lymphocytes Relative: 49 %
Lymphs Abs: 4 10*3/uL (ref 0.7–4.0)
MCH: 32.6 pg (ref 26.0–34.0)
MCHC: 33.5 g/dL (ref 30.0–36.0)
MCV: 97.3 fL (ref 80.0–100.0)
Monocytes Absolute: 0.6 10*3/uL (ref 0.1–1.0)
Monocytes Relative: 8 %
Neutro Abs: 3.4 10*3/uL (ref 1.7–7.7)
Neutrophils Relative %: 41 %
Platelets: 242 10*3/uL (ref 150–400)
RBC: 4.14 MIL/uL (ref 3.87–5.11)
RDW: 12.1 % (ref 11.5–15.5)
WBC: 8.2 10*3/uL (ref 4.0–10.5)
nRBC: 0 % (ref 0.0–0.2)

## 2020-11-10 LAB — URINALYSIS, ROUTINE W REFLEX MICROSCOPIC
Bacteria, UA: NONE SEEN
Bilirubin Urine: NEGATIVE
Glucose, UA: NEGATIVE mg/dL
Ketones, ur: NEGATIVE mg/dL
Leukocytes,Ua: NEGATIVE
Nitrite: NEGATIVE
Protein, ur: NEGATIVE mg/dL
Specific Gravity, Urine: 1.01 (ref 1.005–1.030)
pH: 6 (ref 5.0–8.0)

## 2020-11-10 LAB — WET PREP, GENITAL
Clue Cells Wet Prep HPF POC: NONE SEEN
Sperm: NONE SEEN
Trich, Wet Prep: NONE SEEN
Yeast Wet Prep HPF POC: NONE SEEN

## 2020-11-10 LAB — BASIC METABOLIC PANEL
Anion gap: 7 (ref 5–15)
BUN: 7 mg/dL (ref 6–20)
CO2: 23 mmol/L (ref 22–32)
Calcium: 9.4 mg/dL (ref 8.9–10.3)
Chloride: 108 mmol/L (ref 98–111)
Creatinine, Ser: 0.74 mg/dL (ref 0.44–1.00)
GFR, Estimated: >60 mL/min (ref >60)
Glucose, Bld: 109 mg/dL — ABNORMAL HIGH (ref 70–99)
Potassium: 3.3 mmol/L — ABNORMAL LOW (ref 3.5–5.1)
Sodium: 138 mmol/L (ref 135–145)

## 2020-11-10 LAB — PREGNANCY, URINE: Preg Test, Ur: NEGATIVE

## 2020-11-10 MED ORDER — SODIUM CHLORIDE 0.9 % IV BOLUS
1000.0000 mL | Freq: Once | INTRAVENOUS | Status: AC
  Administered 2020-11-10: 1000 mL via INTRAVENOUS

## 2020-11-10 MED ORDER — DOXYCYCLINE HYCLATE 100 MG PO TABS
100.0000 mg | ORAL_TABLET | Freq: Once | ORAL | Status: AC
  Administered 2020-11-10: 100 mg via ORAL
  Filled 2020-11-10: qty 1

## 2020-11-10 MED ORDER — FENTANYL CITRATE (PF) 100 MCG/2ML IJ SOLN
50.0000 ug | Freq: Once | INTRAMUSCULAR | Status: AC
  Administered 2020-11-10: 50 ug via INTRAVENOUS
  Filled 2020-11-10: qty 2

## 2020-11-10 MED ORDER — DIPHENHYDRAMINE HCL 50 MG/ML IJ SOLN
25.0000 mg | Freq: Once | INTRAMUSCULAR | Status: AC
  Administered 2020-11-10: 25 mg via INTRAVENOUS
  Filled 2020-11-10: qty 1

## 2020-11-10 MED ORDER — DOXYCYCLINE HYCLATE 100 MG PO CAPS: 100.0000 mg | ORAL_CAPSULE | Freq: Two times a day (BID) | ORAL | 0 refills | Status: DC

## 2020-11-10 MED ORDER — METOCLOPRAMIDE HCL 10 MG PO TABS: 10.0000 mg | ORAL_TABLET | Freq: Four times a day (QID) | ORAL | 0 refills | Status: DC | PRN

## 2020-11-10 MED ORDER — CEFTRIAXONE SODIUM 1 G IJ SOLR
500.0000 mg | Freq: Once | INTRAMUSCULAR | Status: AC
  Administered 2020-11-10: 500 mg via INTRAMUSCULAR
  Filled 2020-11-10: qty 10

## 2020-11-10 MED ORDER — POTASSIUM CHLORIDE CRYS ER 20 MEQ PO TBCR
40.0000 meq | EXTENDED_RELEASE_TABLET | Freq: Once | ORAL | Status: AC
  Administered 2020-11-10: 40 meq via ORAL
  Filled 2020-11-10: qty 2

## 2020-11-10 MED ORDER — METOCLOPRAMIDE HCL 5 MG/ML IJ SOLN
10.0000 mg | Freq: Once | INTRAMUSCULAR | Status: AC
  Administered 2020-11-10: 10 mg via INTRAVENOUS
  Filled 2020-11-10: qty 2

## 2020-11-10 NOTE — Telephone Encounter (Signed)
Per initial encounter, "Pt is calling and has been having headaches for past 3 days also back pain. Pt did have surgery last yr on her back. The mobile bus is not running today and no openings at [facility]: contacted pt to discuss her symptoms; the pt says she has a constant headache rated 10 out of 10; the pain is located behind her eyes; she has taken Tylenol, Goody Powder, and allergy medication; she has nasal congestion; she denies cough and fever; the pt also complains of sciatic pain that radiates down her legs L>R; recommendations made per nurse triage protocol; she verbalized understanding and states she can not drive to the ED and she does not want to go to the ED; she declined this triage RN calling EMS; pt declined Televisit and says she will go to Urgent Care; the pt is seen at [facility]; will route to office for notification.  Reason for Disposition . Patient sounds very sick or weak to the triager  Answer Assessment - Initial Assessment Questions 1. LOCATION: "Where does it hurt?"      Behind eyes 2. ONSET: "When did the headache start?" (Minutes, hours or days)      11/07/20 3. PATTERN: "Does the pain come and go, or has it been constant since it started?"     constant 4. SEVERITY: "How bad is the pain?" and "What does it keep you from doing?"  (e.g., Scale 1-10; mild, moderate, or severe)   - MILD (1-3): doesn't interfere with normal activities    - MODERATE (4-7): interferes with normal activities or awakens from sleep    - SEVERE (8-10): excruciating pain, unable to do any normal activities     10 out of 10 5. RECURRENT SYMPTOM: "Have you ever had headaches before?" If Yes, ask: "When was the last time?" and "What happened that time?"      Yes migraines 6. CAUSE: "What do you think is causing the headache?"     Not sure, originally thought was allergies 7. MIGRAINE: "Have you been diagnosed with migraine headaches?" If Yes, ask: "Is this headache similar?"     Hx  Migraines 8. HEAD INJURY: "Has there been any recent injury to the head?"   no 9. OTHER SYMPTOMS: "Do you have any other symptoms?" (fever, stiff neck, eye pain, sore throat, cold symptoms)     Nasal congestion 10. PREGNANCY: "Is there any chance you are pregnant?" "When was your last menstrual period?"  Protocols used: HEADACHE-A-AH

## 2020-11-10 NOTE — Discharge Instructions (Signed)
Take Reglan as needed for headaches.  Continue taking Tylenol or Motrin for headache  Your gonorrhea chlamydia test is pending right now.  Please check the results on your phone.  Continue doxycycline until your result comes back.  If your results are negative then you can stop doxycycline.  If the result is positive you need to notify your partner.  Please have your partner use condoms next time   See your doctor   Return to ER if you have worse headaches, neck pain, fever, vomiting, worse pelvic discharge

## 2020-11-10 NOTE — ED Provider Notes (Signed)
[facility] DEPT Provider Note   CSN: [id number] Arrival date & time: 11/10/20  1348     History Chief Complaint  Patient presents with  . Migraine  . Exposure to STD    Caitlin Valdez is a 50 y.o. female history of fibromyalgia, migraines here presenting with worsening headaches and possible STD exposure.  Patient states that for the last 3 to 4 days, she has been having worsening left frontal headaches.  She states that the pain is behind her left eye and goes to the back of her head.  She states that This is worse than her usual migraines.  She has photophobia and phonophobia as well.  Patient denies any fever or neck pain or stiffness.  She states that she had unprotected sex with a female 5 days ago and she has some vaginal discharge since then.  Denies any abdominal pain.  The history is provided by the patient.       Past Medical History:  Diagnosis Date  . Abdominal pain, right lower quadrant   . Anxiety   . Arthritis   . Depression   . Fibromyalgia   . GERD (gastroesophageal reflux disease)   . Migraine   . Obesity   . Peripheral vascular disease (HCC)    RIGHT LEG  . PONV (postoperative nausea and vomiting)   . PTSD (post-traumatic stress disorder)   . Stroke [facility])    many years ago - no residual  . Trichomonas 2012  . Varicose veins of leg with swelling, right    "sometimes swelling."  . Vitamin D deficiency     Patient Active Problem List   Diagnosis Date Noted  . Other microscopic hematuria 07/04/2020  . Numbness of upper extremity 04/19/2020  . S/P cervical spinal fusion 04/19/2020  . Cervical spinal stenosis 02/16/2020  . Gastroesophageal reflux disease without esophagitis 02/01/2020  . S/P lumbar microdiscectomy 01/04/2020  . Chronic low back pain with sciatica 08/10/2019  . Radiculopathy due to lumbar intervertebral disc disorder 06/22/2019  . Anxiety and depression 03/17/2018  . Bacterial vaginitis 02/25/2017  . S/P  endometrial ablation 06/27/2016  . Migraine without status migrainosus, not intractable 06/27/2016  . Vitamin D insufficiency 02/11/2016  . De Quervain's tenosynovitis, left 02/09/2016  . Vitamin B12 deficiency 10/17/2015  . Allergic rhinitis 12/29/2014  . Current smoker 11/22/2014  . Obesity 06/29/2014  . Exposure to STD 05/09/2014  . Fibromyalgia 07/15/2009    Past Surgical History:  Procedure Laterality Date  . ANTERIOR CERVICAL DECOMP/DISCECTOMY FUSION N/A 02/16/2020   Procedure: CERVICAL FIVE THROUGH CERVICAL SIX, CERVICAL SIX  THROUGH CERVICAL SEVEN ANTERIOR CERVICAL DECOMPRESSION/DISCECTOMY FUSION, ALLOGRAFT, PLATE;  Surgeon: Marybelle Killings, MD;  Location: [facility];  Service: Orthopedics;  Laterality: N/A;  . CESAREAN SECTION    . FRACTURE SURGERY    . HYSTEROSCOPY WITH NOVASURE N/A 08/08/2014   Procedure:  NOVASURE;  Surgeon: Emily Filbert, MD;  Location: [facility];  Service: Gynecology;  Laterality: N/A;  . LUMBAR LAMINECTOMY/DECOMPRESSION MICRODISCECTOMY N/A 09/27/2019   Procedure: RIGHT LUMBAR FIVE TO SACRAL ONE MICRODISCECTOMY;  Surgeon: Marybelle Killings, MD;  Location: [facility];  Service: Orthopedics;  Laterality: N/A;  . VEIN SURGERY       OB History    Gravida  2   Para  2   Term  2   Preterm      AB      Living  2     SAB  IAB      Ectopic      Multiple      Live Births              Family History  Problem Relation Age of Onset  . Diabetes Mother   . Hypertension Mother   . Breast cancer Mother 2  . Diabetes Father   . Hypertension Father   . Diabetes Other   . Hypertension Other   . Other Neg Hx     Social History   Tobacco Use  . Smoking status: Current Every Day Smoker    Packs/day: 1.00    Years: 5.00    Pack years: 5.00    Types: Cigarettes  . Smokeless tobacco: Never Used  . Tobacco comment: trying to quit  Vaping Use  . Vaping Use: Never used  Substance Use Topics  . Alcohol use: No  . Drug use: Yes    Types: Marijuana     Comment: ocassional use for pain - 02/12/20    Home Medications Prior to Admission medications   Medication Sig Start Date End Date Taking? Authorizing Provider  amitriptyline (ELAVIL) 10 MG tablet Take 2 tablets (20 mg total) by mouth at bedtime. 07/04/20   Mayers, Cari S, PA-C  methocarbamol (ROBAXIN) 500 MG tablet Take 1 tablet (500 mg total) by mouth every 8 (eight) hours as needed for muscle spasms. 07/28/20   Claiborne Rigg, NP  Multiple Vitamin (MULTIVITAMIN WITH MINERALS) TABS tablet Take 1 tablet by mouth daily. Patient not taking: No sig reported    [provider]  omeprazole (PRILOSEC) 40 MG capsule Take 1 capsule (40 mg total) by mouth daily. 07/28/20   Claiborne Rigg, NP  Vitamin D, Ergocalciferol, (DRISDOL) 1.25 MG (50000 UNIT) CAPS capsule Take 1 capsule (50,000 Units total) by mouth every 7 (seven) days. 07/28/20   Claiborne Rigg, NP    Allergies    Latex, Lyrica [pregabalin], and Tramadol  Review of Systems   Review of Systems  Genitourinary: Positive for vaginal discharge.  Neurological: Positive for headaches.  All other systems reviewed and are negative.   Physical Exam Updated Vital Signs BP (!) 145/89 (BP Location: Left Arm)   Pulse 73   Temp 98.2 F (36.8 C) (Oral)   Resp 18   SpO2 100%   Physical Exam Vitals and nursing note reviewed.  Constitutional:      Comments: Uncomfortable  HENT:     Head: Normocephalic.     Nose: Nose normal.     Mouth/Throat:     Mouth: Mucous membranes are dry.  Eyes:     Extraocular Movements: Extraocular movements intact.     Pupils: Pupils are equal, round, and reactive to light.  Neck:     Comments: No meningeal signs Cardiovascular:     Rate and Rhythm: Normal rate and regular rhythm.     Pulses: Normal pulses.     Heart sounds: Normal heart sounds.  Pulmonary:     Effort: Pulmonary effort is normal.     Breath sounds: Normal breath sounds.  Abdominal:     General: Abdomen is flat.      Palpations: Abdomen is soft.  Musculoskeletal:        General: Normal range of motion.     Cervical back: Normal range of motion and neck supple.  Skin:    General: Skin is warm.     Capillary Refill: Capillary refill takes less than 2 seconds.  Neurological:  General: No focal deficit present.     Mental Status: She is alert.     Comments: Cranial nerves II to XII intact.  Patient has normal finger-to-nose bilaterally.  Psychiatric:        Mood and Affect: Mood normal.        Behavior: Behavior normal.     ED Results / Procedures / Treatments   Labs (all labs ordered are listed, but only abnormal results are displayed) Labs Reviewed  WET PREP, GENITAL - Abnormal; Notable for the following components:      Result Value   WBC, Wet Prep HPF POC FEW (*)    All other components within normal limits  URINALYSIS, ROUTINE W REFLEX MICROSCOPIC - Abnormal; Notable for the following components:   Hgb urine dipstick LARGE (*)    All other components within normal limits  BASIC METABOLIC PANEL - Abnormal; Notable for the following components:   Potassium 3.3 (*)    Glucose, Bld 109 (*)    All other components within normal limits  PREGNANCY, URINE  CBC WITH DIFFERENTIAL/PLATELET  I-STAT BETA HCG BLOOD, ED ([facility], [facility], [facility] ONLY)  GC/CHLAMYDIA PROBE AMP ([facility]) NOT AT [facility]    EKG None  Radiology CT Head Wo Contrast  Result Date: 11/10/2020 CLINICAL DATA:  Headache, intracranial hemorrhage suspected EXAM: CT HEAD WITHOUT CONTRAST TECHNIQUE: Contiguous axial images were obtained from the base of the skull through the vertex without intravenous contrast. COMPARISON:  12/18/2011 FINDINGS: Brain: No evidence of acute infarction, hemorrhage, hydrocephalus, extra-axial collection or mass lesion/mass effect. Mild mineralization in the basal ganglia bilaterally. Vascular: No hyperdense vessel or unexpected calcification. Skull: No osseous abnormality. Sinuses/Orbits: Visualized paranasal  sinuses are clear. Visualized mastoid sinuses are clear. Visualized orbits demonstrate no focal abnormality. Other: None IMPRESSION: 1. No acute intracranial pathology. Electronically Signed   By: Kathreen Devoid   On: 11/10/2020 18:05    Procedures Procedures   Medications Ordered in ED Medications  potassium chloride SA (KLOR-CON) CR tablet 40 mEq (has no administration in time range)  metoCLOPramide (REGLAN) injection 10 mg (10 mg Intravenous Given 11/10/20 1734)  diphenhydrAMINE (BENADRYL) injection 25 mg (25 mg Intravenous Given 11/10/20 1735)  sodium chloride 0.9 % bolus 1,000 mL (1,000 mLs Intravenous New Bag/Given 11/10/20 1735)  fentaNYL (SUBLIMAZE) injection 50 mcg (50 mcg Intravenous Given 11/10/20 1735)  cefTRIAXone (ROCEPHIN) injection 500 mg (500 mg Intramuscular Given 11/10/20 1757)  doxycycline (VIBRA-TABS) tablet 100 mg (100 mg Oral Given 11/10/20 1735)    ED Course  I have reviewed the triage vital signs and the nursing notes.  Pertinent labs & imaging results that were available during my care of the patient were reviewed by me and considered in my medical decision making (see chart for details).    MDM Rules/Calculators/A&P                         Caitlin Valdez is a 50 y.o. female here presenting with pelvic discharge and headaches.  She had unprotected sex and was concerned for STDs.  Will empirically treat for STDs and have her self swab.  She has no abdominal tenderness. She has history of migraines but has worsening headache so we will get a CT head.  We will also give migraine cocktail.  6:49 PM Labs show potassium 3.3 that is supplemented.  CT head unremarkable.  Given migraine cocktail and felt better.  Patient was also treated for GC and chlamydia.  Her wet  prep is unremarkable.  Will discharge home with a course of doxycycline and told her to check to see if she has chlamydia.  If she has chlamydia then she needs to finish the course of treatment and notify her partner     Final Clinical Impression(s) / ED Diagnoses Final diagnoses:  None    Rx / DC Orders ED Discharge Orders    None       Drenda Freeze, MD 11/10/20 1850

## 2020-11-10 NOTE — ED Triage Notes (Signed)
Emergency Medicine Provider Triage Evaluation Note  Caitlin Valdez , a 50 y.o. female  was evaluated in triage.  Pt complains of headaches x3 days, has associated photophobia, increased sensitivity to noise, no change in vision, paresthesia or weakness upper lower extremities.  Denies recent head trauma, not on anticoagulant.  Patient has a history of migraines feels stable to this.  She also has vaginal discharge denies vaginal bleeding, no pelvic pain.  Thinks disease infection..  Review of Systems  Positive: Headaches, nausea, vomiting Negative: Change in vision, paresthesia or weakness  Physical Exam  BP (!) 141/93 (BP Location: Left Arm)   Pulse 74   Temp 98.2 F (36.8 C) (Oral)   Resp 16   SpO2 100%  Gen:   Awake, no distress   HEENT:  Atraumatic  Resp:  Normal effort  Cardiac:  Normal rate  MSK:   Moves extremities without difficulty  Neuro:  Cranial nerves II through XII grossly intact, equal strength in the upper lower extremities,  Medical Decision Making  Medically screening exam initiated at 2:51 PM.  Appropriate orders placed.  Caitlin Valdez was informed that the remainder of the evaluation will be completed by another provider, this initial triage assessment does not replace that evaluation, and the importance of remaining in the ED until their evaluation is complete.  Clinical Impression  Headaches, vaginal discharge, lab work has been ordered, patient will need further work-up here in the emergency department.   Marcello Fennel, PA-C 11/10/20 1453

## 2020-11-10 NOTE — ED Triage Notes (Addendum)
Pt arrived via POV, c/o migraine x2 days, light sensitivity, worsening pain in head. Hx of migraines.   Also concern for STD exposure, states unprotected sex mon, colored discharge since.

## 2020-11-10 NOTE — Telephone Encounter (Signed)
I returned pt's call not realizing she had just spoken to another triad nurse regarding the same issues.   Pt had called in c/o a headache for 3 days and back pain.  Pt c/o a headache which she has had for 3 days and back pain which she said is chronic from a nerve injury.  See triage notes below.  I have referred her to the ED per the protocol since she is not getting relief from her headache (history of migraines, this feels similar) with OTC medication and she is out of her migraine medication.  I forwarded my notes to [facility].  Pt did not say one way or the other if she would go to the ED or not.      Reason for Disposition . Patient sounds very sick or weak to the triager    Headache for 3 days and lower back pain  Answer Assessment - Initial Assessment Questions 1. LOCATION: "Where does it hurt?"      I returned pt's call not realizing she had talked with another nurse just a few minutes ago. Pt c/o having a headache behind her eyes and a runny nose.    2. ONSET: "When did the headache start?" (Minutes, hours or days)      3 days ago. She is also c/o lower back pain with the pain radiating down her legs.   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 3. PATTERN: "Does the pain come and go, or has it been constant since it started?"     The headache is constant for the last 3 days.   I asked if she had a history of migraines and she said,  "Yes".   She is out of her migraine medicine and the OTC medication is not helping.    "I took something for my headache last night but it didn't help".    "My head still hurts this morning".   "I just don't feel good".   "My back hurts and I have a headache". 4. SEVERITY: "How bad is the pain?" and "What does it keep you from doing?"  (e.g., Scale 1-10; mild, moderate, or severe)   - MILD (1-3): doesn't interfere with normal activities    - MODERATE (4-7): interferes with normal activities or awakens from sleep     - SEVERE (8-10): excruciating pain, unable to do any normal activities        "12" on pain scale 5. RECURRENT SYMPTOM: "Have you ever had headaches before?" If Yes, ask: "When was the last time?" and "What happened that time?"      Yes gets migraines.   The back pain is chronic.   She was getting treatment for the back pain at [facility].   "I owe them money and I don't have the money to pay to them right now".   "I haven't seen them in a little over a year". 6. CAUSE: "What do you think is causing the headache?"     "A migraine, I guess" 7. MIGRAINE: "Have you been diagnosed with migraine headaches?" If Yes, ask: "Is this headache similar?"      Yes   This headache is similar when I asked her. 8. HEAD INJURY: "Has there been any recent injury to the head?"      No 9. OTHER SYMPTOMS: "Do you have any other symptoms?" (fever, stiff neck, eye pain, sore throat, cold symptoms)     Runny nose  and chronic lower back pain.    10. PREGNANCY: "Is there any chance you are pregnant?" "When was your last menstrual period?"       Not asked  Protocols used: HEADACHE-A-AH

## 2020-11-13 LAB — GC/CHLAMYDIA PROBE AMP (~~LOC~~) NOT AT ARMC
Chlamydia: POSITIVE — AB
Comment: NEGATIVE
Comment: NORMAL
Neisseria Gonorrhea: NEGATIVE

## 2020-11-29 ENCOUNTER — Telehealth: Payer: Self-pay | Admitting: Critical Care Medicine

## 2020-11-29 NOTE — Telephone Encounter (Signed)
Pt called and given appointment for 12/12/20 to complete FMLA paperwork.

## 2020-11-29 NOTE — Telephone Encounter (Signed)
PT is dropping off Disability Docs for Dr. Joya Gaskins. Pt is  ASKING FOR COPIES WHEN IT'S DONE. PT is also asking to get them back on Monday. Pt was advise it can take from 7-14 days. Please advise and thank you.

## 2020-12-06 ENCOUNTER — Ambulatory Visit: Payer: Self-pay | Attending: Orthopaedic Surgery | Admitting: Physical Therapy

## 2020-12-06 ENCOUNTER — Other Ambulatory Visit: Payer: Self-pay

## 2020-12-06 ENCOUNTER — Encounter: Payer: Self-pay | Admitting: Physical Therapy

## 2020-12-06 DIAGNOSIS — R2689 Other abnormalities of gait and mobility: Secondary | ICD-10-CM | POA: Insufficient documentation

## 2020-12-06 DIAGNOSIS — R252 Cramp and spasm: Secondary | ICD-10-CM | POA: Insufficient documentation

## 2020-12-06 DIAGNOSIS — M5441 Lumbago with sciatica, right side: Secondary | ICD-10-CM | POA: Insufficient documentation

## 2020-12-06 DIAGNOSIS — G8929 Other chronic pain: Secondary | ICD-10-CM | POA: Insufficient documentation

## 2020-12-06 DIAGNOSIS — M6281 Muscle weakness (generalized): Secondary | ICD-10-CM | POA: Insufficient documentation

## 2020-12-06 NOTE — Therapy (Signed)
[place] [place], [place], [address] Phone: [phone]   Fax:  [phone]  Physical Therapy Treatment  Patient Details  Name: Caitlin Valdez MRN: [id number] Date of Birth: 1971/03/23 Referring Provider (PT): Chaney Born Date: 12/06/2020   PT End of Session - 12/06/20 1204    Visit Number 7    Date for PT Re-Evaluation 11/29/20    PT Start Time 6962    PT Stop Time 1214    PT Time Calculation (min) 29 min    Behavior During Therapy [facility] for tasks assessed/performed           Past Medical History:  Diagnosis Date  . Abdominal pain, right lower quadrant   . Anxiety   . Arthritis   . Depression   . Fibromyalgia   . GERD (gastroesophageal reflux disease)   . Migraine   . Obesity   . Peripheral vascular disease (HCC)    RIGHT LEG  . PONV (postoperative nausea and vomiting)   . PTSD (post-traumatic stress disorder)   . Stroke [facility])    many years ago - no residual  . Trichomonas 2012  . Varicose veins of leg with swelling, right    "sometimes swelling."  . Vitamin D deficiency     Past Surgical History:  Procedure Laterality Date  . ANTERIOR CERVICAL DECOMP/DISCECTOMY FUSION N/A 02/16/2020   Procedure: CERVICAL FIVE THROUGH CERVICAL SIX, CERVICAL SIX  THROUGH CERVICAL SEVEN ANTERIOR CERVICAL DECOMPRESSION/DISCECTOMY FUSION, ALLOGRAFT, PLATE;  Surgeon: Marybelle Killings, MD;  Location: [facility];  Service: Orthopedics;  Laterality: N/A;  . CESAREAN SECTION    . FRACTURE SURGERY    . HYSTEROSCOPY WITH NOVASURE N/A 08/08/2014   Procedure:  NOVASURE;  Surgeon: Emily Filbert, MD;  Location: [facility];  Service: Gynecology;  Laterality: N/A;  . LUMBAR LAMINECTOMY/DECOMPRESSION MICRODISCECTOMY N/A 09/27/2019   Procedure: RIGHT LUMBAR FIVE TO SACRAL ONE MICRODISCECTOMY;  Surgeon: Marybelle Killings, MD;  Location: [facility];  Service: Orthopedics;  Laterality: N/A;  . VEIN SURGERY      There were no vitals filed for this  visit.   Subjective Assessment - 12/06/20 1145    Subjective Been working two jobs and unable to get into PT. Not feeling good at all, this past weekend could not get out of bed. Pain in the L shoulder down into the L arm.    Pertinent History hx of cervical fusion (2021) and lumbar discectomy (2021)    Currently in Pain? Yes    Pain Score 10-Worst pain ever    Pain Location --   all over             [facility] PT Assessment - 12/06/20 0001      AROM   AROM Assessment Site Lumbar    Lumbar Flexion 25% limited + pain    Lumbar Extension 75% limited + pain    Lumbar - Right Side Bend 25% limited    Lumbar - Left Side Bend 25% limited    Lumbar - Right Rotation 50% limited    Lumbar - Left Rotation 50% limited      Strength   Overall Strength Comments BLE strength 5/5 except hip abd/ext 4/5      Transfers   Five time sit to stand comments  17.55  seconds, no pain  [facility] PT Treatment/Exercise - 12/06/20 0001      Lumbar Exercises: Aerobic   Nustep L5 x 3 min      Modalities   Modalities Moist Heat      Moist Heat Therapy   Number Minutes Moist Heat 10 Minutes    Moist Heat Location Lumbar Spine;Cervical                    PT Short Term Goals - 10/18/20 1713      PT SHORT TERM GOAL #1   Title Pt will be I with initial HEP    Status Achieved             PT Long Term Goals - 12/06/20 1153      PT LONG TERM GOAL #2   Title Pt will demo lumbar AROM WFL with no reports of increased LBP    Status On-going      PT LONG TERM GOAL #3   Title Pt will report resolution of radiating pain in RLE    Status On-going      PT LONG TERM GOAL #4   Title Pt will report able to 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 She reports that she has been in really bad  pain all over her body. She reports pain all over her body down her arms and legs, some days she stated she can not get out of bed. 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 She reports recently quitting sitting with the elderly job due to pain. She reports increase pain in both shoulders with light touch. Lumbar ROM was limited and painful, Nu step increased pain. Pt reports having an MD apt Tuesday.    Examination-Activity Limitations Bend;Locomotion Level;Stand;Stairs;Lift    Examination-Participation Restrictions Occupation;Community Activity;Interpersonal Relationship    Stability/Clinical Decision Making Evolving/Moderate complexity    Rehab Potential Fair    PT Frequency 2x / week    PT Duration 6 weeks    PT Treatment/Interventions ADLs/Self Care Home Management;Electrical Stimulation;Iontophoresis 4mg /ml Dexamethasone;Moist Heat;Neuromuscular re-education;Therapeutic exercise;Therapeutic activities;Stair training;Patient/family education;Manual techniques;Dry needling;Passive range of motion    PT Next Visit Plan Advised to return to MD.           Patient will benefit from skilled therapeutic intervention in order to improve the following deficits and impairments:  Abnormal gait,Difficulty walking,Decreased range of motion,Increased muscle spasms,Pain,Hypomobility,Impaired flexibility,Decreased strength,Postural dysfunction  Visit Diagnosis: Muscle weakness (generalized)  Cramp and spasm  Other abnormalities of gait and mobility  Chronic bilateral low back pain with right-sided sciatica     Problem List Patient Active Problem List   Diagnosis Date Noted  . Other microscopic hematuria 07/04/2020  . Numbness of upper extremity 04/19/2020  . S/P cervical spinal fusion 04/19/2020  . Cervical spinal stenosis 02/16/2020  . Gastroesophageal reflux disease without esophagitis  02/01/2020  . S/P lumbar microdiscectomy 01/04/2020  . Chronic low back pain with sciatica 08/10/2019  . Radiculopathy due to lumbar intervertebral disc disorder 06/22/2019  . Anxiety and depression 03/17/2018  . Bacterial vaginitis 02/25/2017  . S/P endometrial ablation 06/27/2016  . Migraine without status migrainosus, not  intractable 06/27/2016  . Vitamin D insufficiency 02/11/2016  . De Quervain's tenosynovitis, left 02/09/2016  . Vitamin B12 deficiency 10/17/2015  . Allergic rhinitis 12/29/2014  . Current smoker 11/22/2014  . Obesity 06/29/2014  . Exposure to STD 05/09/2014  . Fibromyalgia 07/15/2009    Scot Jun 12/06/2020, 12:10 PM  [place] [place], [place], [address] Phone: [phone]   Fax:  [phone]  Name: APPOLLONIA KLEE MRN: [id number] Date of Birth: June 03, 1971
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
## 2020-12-12 ENCOUNTER — Ambulatory Visit
Payer: No Typology Code available for payment source | Attending: Critical Care Medicine | Admitting: Critical Care Medicine

## 2020-12-12 ENCOUNTER — Other Ambulatory Visit: Payer: Self-pay

## 2020-12-12 ENCOUNTER — Encounter: Payer: Self-pay | Admitting: Critical Care Medicine

## 2020-12-12 VITALS — BP 126/75 | HR 82 | Ht 63.0 in

## 2020-12-12 DIAGNOSIS — K219 Gastro-esophageal reflux disease without esophagitis: Secondary | ICD-10-CM | POA: Diagnosis not present

## 2020-12-12 DIAGNOSIS — M797 Fibromyalgia: Secondary | ICD-10-CM | POA: Diagnosis not present

## 2020-12-12 DIAGNOSIS — Z1231 Encounter for screening mammogram for malignant neoplasm of breast: Secondary | ICD-10-CM

## 2020-12-12 DIAGNOSIS — M4802 Spinal stenosis, cervical region: Secondary | ICD-10-CM

## 2020-12-12 DIAGNOSIS — Z981 Arthrodesis status: Secondary | ICD-10-CM

## 2020-12-12 DIAGNOSIS — E559 Vitamin D deficiency, unspecified: Secondary | ICD-10-CM

## 2020-12-12 DIAGNOSIS — M5116 Intervertebral disc disorders with radiculopathy, lumbar region: Secondary | ICD-10-CM

## 2020-12-12 DIAGNOSIS — M542 Cervicalgia: Secondary | ICD-10-CM

## 2020-12-12 DIAGNOSIS — G8929 Other chronic pain: Secondary | ICD-10-CM

## 2020-12-12 DIAGNOSIS — M5440 Lumbago with sciatica, unspecified side: Secondary | ICD-10-CM | POA: Diagnosis not present

## 2020-12-12 DIAGNOSIS — R200 Anesthesia of skin: Secondary | ICD-10-CM

## 2020-12-12 DIAGNOSIS — Z791 Long term (current) use of non-steroidal anti-inflammatories (NSAID): Secondary | ICD-10-CM

## 2020-12-12 MED ORDER — AMITRIPTYLINE HCL 10 MG PO TABS: 20.0000 mg | ORAL_TABLET | Freq: Every day | ORAL | 1 refills | Status: DC

## 2020-12-12 MED ORDER — METOCLOPRAMIDE HCL 10 MG PO TABS: 10.0000 mg | ORAL_TABLET | Freq: Four times a day (QID) | ORAL | 0 refills | Status: DC | PRN

## 2020-12-12 MED ORDER — METOCLOPRAMIDE HCL 10 MG PO TABS: 10.0000 mg | ORAL_TABLET | Freq: Four times a day (QID) | ORAL | 1 refills | Status: DC | PRN

## 2020-12-12 MED ORDER — METHOCARBAMOL 500 MG PO TABS
500.0000 mg | ORAL_TABLET | Freq: Three times a day (TID) | ORAL | 3 refills | Status: DC | PRN
Start: 2020-12-12 — End: 2021-09-18

## 2020-12-12 MED ORDER — VITAMIN D (ERGOCALCIFEROL) 1.25 MG (50000 UNIT) PO CAPS: 50000.0000 [IU] | ORAL_CAPSULE | ORAL | 1 refills | Status: DC

## 2020-12-12 MED ORDER — OMEPRAZOLE 40 MG PO CPDR: 40.0000 mg | DELAYED_RELEASE_CAPSULE | Freq: Every day | ORAL | 3 refills | Status: DC

## 2020-12-12 NOTE — Assessment & Plan Note (Signed)
Refill on vitamin D replacement given

## 2020-12-12 NOTE — Assessment & Plan Note (Signed)
Reflux disease for which she is controlled on proton pump inhibitor refill given

## 2020-12-12 NOTE — Assessment & Plan Note (Signed)
Plan imaging of the neck with CT scan without contrast

## 2020-12-12 NOTE — Patient Instructions (Signed)
Please make a future appointment with the pain management clinic when the provider returns from leave  A CT scan of the neck will be obtained  We provided a work accommodation form for you today that was filled out  Refills on all your medications sent to your [place] will be scheduled and ordered  Return to see Dr. Joya Gaskins 3 months

## 2020-12-12 NOTE — Assessment & Plan Note (Signed)
Chronic left upper extremity with cervical spinal stenosis status post cervical spine fuson

## 2020-12-12 NOTE — Assessment & Plan Note (Signed)
Lumbar radiculopathy status post microdiscectomy of the lumbar spine patient left with chronic pain  Encouraged increased use of the cane  Workplace accommodation form completed with some accommodations listed mainly a stool that has a backrest with cushioning and frequent breaks at least one 15-minute break per  shift

## 2020-12-19 ENCOUNTER — Other Ambulatory Visit: Payer: Self-pay | Admitting: Critical Care Medicine

## 2020-12-19 ENCOUNTER — Ambulatory Visit
Admission: RE | Admit: 2020-12-19 | Discharge: 2020-12-19 | Disposition: A | Discharge status: Home / Self Care | Payer: BC Managed Care – PPO | Source: Ambulatory Visit | Attending: Critical Care Medicine | Admitting: Critical Care Medicine

## 2020-12-19 ENCOUNTER — Other Ambulatory Visit: Payer: Self-pay

## 2020-12-19 DIAGNOSIS — N644 Mastodynia: Secondary | ICD-10-CM

## 2020-12-19 DIAGNOSIS — Z1231 Encounter for screening mammogram for malignant neoplasm of breast: Secondary | ICD-10-CM

## 2020-12-20 ENCOUNTER — Telehealth: Payer: Self-pay | Admitting: Critical Care Medicine

## 2020-12-20 DIAGNOSIS — R928 Other abnormal and inconclusive findings on diagnostic imaging of breast: Secondary | ICD-10-CM | POA: Insufficient documentation

## 2020-12-20 DIAGNOSIS — N6452 Nipple discharge: Secondary | ICD-10-CM | POA: Insufficient documentation

## 2020-12-20 NOTE — Telephone Encounter (Signed)
Mammogram shows nipple possible R ductal dilation  Recommended MRI of breast   Will order  Has biltateral nipple d/c

## 2020-12-20 NOTE — Telephone Encounter (Signed)
Patient has been called and informed of MRI appointment.

## 2020-12-27 ENCOUNTER — Telehealth: Payer: Self-pay | Admitting: Critical Care Medicine

## 2020-12-27 ENCOUNTER — Ambulatory Visit (HOSPITAL_COMMUNITY): Admission: RE | Admit: 2020-12-27 | Payer: BC Managed Care – PPO | Source: Ambulatory Visit

## 2020-12-27 DIAGNOSIS — M797 Fibromyalgia: Secondary | ICD-10-CM

## 2020-12-27 DIAGNOSIS — R928 Other abnormal and inconclusive findings on diagnostic imaging of breast: Secondary | ICD-10-CM

## 2020-12-27 DIAGNOSIS — N6452 Nipple discharge: Secondary | ICD-10-CM

## 2020-12-27 NOTE — Telephone Encounter (Signed)
Original order was generated by the recommendation from Radiology and I will change the order

## 2020-12-27 NOTE — Telephone Encounter (Signed)
Order needs to be changed per radiology.

## 2020-12-27 NOTE — Telephone Encounter (Signed)
Copied from [place] [id number]. Topic: General - Other >> Dec 27, 2020  9:47 AM Antonieta Iba C wrote: Reason for CRM: Elberta Fortis with Radiology is calling in for assistance. He says that pt was scheduled today for imaging but the order need to be updated. Elberta Fortis would like to have MRI order updated to breast with /with out contrast. Order also need to have prior authorization completed.      Radiology says that this is needed before imaging is completed.    Phone: [phone]

## 2020-12-29 ENCOUNTER — Telehealth: Payer: Self-pay | Admitting: Critical Care Medicine

## 2020-12-29 NOTE — Telephone Encounter (Signed)
Copied from [place] [id number]. Topic: General - Other >> Dec 29, 2020 11:54 AM Leward Quan A wrote: Reason for CRM: Patient called in to inquire of Dr Joya Gaskins about getting a handicapped placard form to take to [facility] so that she can get a replacement parking permit for the one that she had which expired. Patient asking for a call back today please at Ph#  [phone]

## 2021-01-01 NOTE — Telephone Encounter (Signed)
Pt was called and informed of letter being ready for pick up.

## 2021-01-01 NOTE — Telephone Encounter (Signed)
Will route to PCP for review. 

## 2021-01-01 NOTE — Telephone Encounter (Signed)
Will sign one and give to you Alycia

## 2021-01-03 ENCOUNTER — Ambulatory Visit: Payer: BC Managed Care – PPO | Attending: Critical Care Medicine | Admitting: Critical Care Medicine

## 2021-01-03 ENCOUNTER — Encounter: Payer: Self-pay | Admitting: Critical Care Medicine

## 2021-01-03 ENCOUNTER — Other Ambulatory Visit: Payer: Self-pay

## 2021-01-03 VITALS — BP 128/88 | HR 72 | Resp 16 | Wt 181.6 lb

## 2021-01-03 DIAGNOSIS — F32A Depression, unspecified: Secondary | ICD-10-CM

## 2021-01-03 DIAGNOSIS — F419 Anxiety disorder, unspecified: Secondary | ICD-10-CM

## 2021-01-03 DIAGNOSIS — M2550 Pain in unspecified joint: Secondary | ICD-10-CM | POA: Diagnosis not present

## 2021-01-03 MED ORDER — CITALOPRAM HYDROBROMIDE 20 MG PO TABS: 20.0000 mg | ORAL_TABLET | Freq: Every day | ORAL | 3 refills | Status: DC

## 2021-01-03 NOTE — Assessment & Plan Note (Signed)
Chronic pain with no improvement on current treatment.  Advised to follow up with pain management clinic.  Consider contacting social security office to begin the process of applying for disability. Rheumatoid labs drawn today. Follow up in 4 months.

## 2021-01-03 NOTE — Patient Instructions (Signed)
Begin citalopram 1 tablet daily for anxiety and depression Please go to the [facility] on open access days for assessment see the handout we gave you  Please reconnect with your chronic pain management practice  Please pick up a financial assistance packet and if your [facility] insurance has been canceled then apply for financial assistance  Please consider beginning the process for disability you will have to go to [place] office to register they will send this documentation we will send them reports from your chart including today's visit  Stop by the lab and obtain full set of lab draws for evaluation of your arthritis  No other changes in your medications  Return Dr. Joya Gaskins 4 months for primary care

## 2021-01-03 NOTE — Assessment & Plan Note (Signed)
Worsening anxiety and depression recently due to chronic pain and difficult financial situation. Begin citalopram 20 mg daily. Go to the open access [facility] to see a mental health provider on open access days. Follow up in 4 months.

## 2021-01-03 NOTE — Progress Notes (Signed)
Established Patient Office Visit  Subjective:  Patient ID: ABBIGAL RADICH, female    DOB: [birth year]  Age: 50 y.o. MRN: [id number]  CC: No chief complaint on file.   HPI Caitlin Valdez presents for follow up regarding diffuse bodily pain. The patient has a history of cervical spinal fusion, cervical spinal stenosis, sciatic nerve pain, radiculopathy due to lumbar intervertebral disc disorder, and fibromyalgia. The patient has been experiencing chronic, 10/10 pain in all areas of her body. The pain is worse when moving, changing positions, and touching any of the areas. The pain is so severe she is unable to sleep at all. The patient takes goodie powder and Aleve to treat the pain which is somewhat helpful. Taking Robaxin three times daily has not been helpful. The patient saw a pain management clinic in the past and was prescribed Percocet. However, the patient is now unable to afford the copay for her visits to the pain clinic. The patient recently was placed on a leave of absence from her job until September. She would like to apply for disability.   The patient has been experiencing significant anxiety and depression due to her financial struggles and pain. She would like to see a mental health specialist to manage her symptoms. The patient states she took a medication for her mental health about 6 years ago but does not remember what this medication was.   Past Medical History:  Diagnosis Date   Abdominal pain, right lower quadrant    Anxiety    Arthritis    Depression    Fibromyalgia    GERD (gastroesophageal reflux disease)    Migraine    Obesity    Peripheral vascular disease (HCC)    RIGHT LEG   PONV (postoperative nausea and vomiting)    PTSD (post-traumatic stress disorder)    Stroke ([place])    many years ago - no residual   Trichomonas 2012   Varicose veins of leg with swelling, right    "sometimes swelling."   Vitamin D deficiency     Past Surgical History:  Procedure  Laterality Date   ANTERIOR CERVICAL DECOMP/DISCECTOMY FUSION N/A 02/16/2020   Procedure: CERVICAL FIVE THROUGH CERVICAL SIX, CERVICAL SIX  THROUGH CERVICAL SEVEN ANTERIOR CERVICAL DECOMPRESSION/DISCECTOMY FUSION, ALLOGRAFT, PLATE;  Surgeon: Marybelle Killings, MD;  Location: [facility];  Service: Orthopedics;  Laterality: N/A;   CESAREAN SECTION     FRACTURE SURGERY     HYSTEROSCOPY WITH NOVASURE N/A 08/08/2014   Procedure:  NOVASURE;  Surgeon: Emily Filbert, MD;  Location: [facility];  Service: Gynecology;  Laterality: N/A;   LUMBAR LAMINECTOMY/DECOMPRESSION MICRODISCECTOMY N/A 09/27/2019   Procedure: RIGHT LUMBAR FIVE TO SACRAL ONE MICRODISCECTOMY;  Surgeon: Marybelle Killings, MD;  Location: [facility];  Service: Orthopedics;  Laterality: N/A;   VEIN SURGERY      Family History  Problem Relation Age of Onset   Diabetes Mother    Hypertension Mother    Breast cancer Mother 47   Diabetes Father    Hypertension Father    Diabetes Other    Hypertension Other    Other Neg Hx     Social History   Socioeconomic History   Marital status: Single    Spouse name: Not on file   Number of children: Not on file   Years of education: Not on file   Highest education level: Not on file  Occupational History   Not on file  Tobacco Use   Smoking status: Every  Day    Packs/day: 1.00    Years: 5.00    Pack years: 5.00    Types: Cigarettes   Smokeless tobacco: Never   Tobacco comments:    trying to quit  Vaping Use   Vaping Use: Never used  Substance and Sexual Activity   Alcohol use: No   Drug use: Yes    Types: Marijuana    Comment: ocassional use for pain - 02/12/20   Sexual activity: Yes    Birth control/protection: Condom  Other Topics Concern   Not on file  Social History Narrative   Not on file   Social Determinants of Health   Financial Resource Strain: Not on file  Food Insecurity: Not on file  Transportation Needs: Not on file  Physical Activity: Not on file  Stress: Not on file  Social  Connections: Not on file  Intimate Partner Violence: Not on file    Outpatient Medications Prior to Visit  Medication Sig Dispense Refill   amitriptyline (ELAVIL) 10 MG tablet Take 2 tablets (20 mg total) by mouth at bedtime. 60 tablet 1   methocarbamol (ROBAXIN) 500 MG tablet Take 1 tablet (500 mg total) by mouth every 8 (eight) hours as needed for muscle spasms. 90 tablet 3   metoCLOPramide (REGLAN) 10 MG tablet Take 1 tablet (10 mg total) by mouth every 6 (six) hours as needed for nausea (nausea/headache). 30 tablet 1   Multiple Vitamin (MULTIVITAMIN WITH MINERALS) TABS tablet Take 1 tablet by mouth daily. (Patient not taking: No sig reported)     omeprazole (PRILOSEC) 40 MG capsule Take 1 capsule (40 mg total) by mouth daily. 30 capsule 3   Vitamin D, Ergocalciferol, (DRISDOL) 1.25 MG (50000 UNIT) CAPS capsule Take 1 capsule (50,000 Units total) by mouth every 7 (seven) days. 12 capsule 1   No facility-administered medications prior to visit.    Allergies  Allergen Reactions   Latex Hives   Lyrica [Pregabalin] Swelling and Other (See Comments)    Her report: tongue swelling   Tramadol Nausea Only    Feel sick    ROS Review of Systems  Constitutional:  Negative for chills and fever.  HENT:  Negative for congestion and rhinorrhea.   Eyes: Negative.   Respiratory:  Negative for cough, shortness of breath and wheezing.   Cardiovascular:  Negative for chest pain.  Gastrointestinal:  Negative for abdominal pain, diarrhea, nausea and vomiting.  Genitourinary: Negative.   Musculoskeletal:  Positive for arthralgias, back pain, gait problem, joint swelling and myalgias.  Neurological:  Positive for weakness. Negative for headaches.  Psychiatric/Behavioral:  Negative for suicidal ideas. The patient is nervous/anxious.      Objective:    Physical Exam Vitals and nursing note reviewed.  Constitutional:      General: She is not in acute distress. HENT:     Head: Normocephalic.      Mouth/Throat:     Mouth: Mucous membranes are moist.     Pharynx: Oropharynx is clear.  Eyes:     Conjunctiva/sclera: Conjunctivae normal.  Cardiovascular:     Rate and Rhythm: Normal rate and regular rhythm.     Heart sounds: Normal heart sounds. No murmur heard.   No friction rub. No gallop.  Pulmonary:     Effort: Pulmonary effort is normal. No respiratory distress.     Breath sounds: Normal breath sounds. No wheezing.  Abdominal:     General: There is no distension.     Palpations: Abdomen is soft.  Tenderness: There is no abdominal tenderness.  Musculoskeletal:        General: Tenderness (Diffuse tenderness to palpation of all extremities, back, and torso) present.     Cervical back: Normal range of motion.     Comments: 5/5 strength of all extremities Full range of motion of all extremities. Spasticity noted of paraspinal muscles.   Skin:    General: Skin is warm.  Neurological:     Mental Status: She is alert and oriented to person, place, and time.  Psychiatric:        Mood and Affect: Mood is depressed. Affect is tearful.    BP 128/88   Pulse 72   Resp 16   Wt 181 lb 9.6 oz (82.4 kg)   SpO2 100%   BMI 32.17 kg/m  Wt Readings from Last 3 Encounters:  01/03/21 181 lb 9.6 oz (82.4 kg)  08/16/20 186 lb (84.4 kg)  07/28/20 191 lb 6.4 oz (86.8 kg)     Health Maintenance Due  Topic Date Due   Pneumococcal Vaccine 71-72 Years old (1 - PCV) Never done   COVID-19 Vaccine (3 - Booster) 08/15/2020    There are no preventive care reminders to display for this patient.  Lab Results  Component Value Date   TSH 0.780 04/29/2019   Lab Results  Component Value Date   WBC 8.2 11/10/2020   HGB 13.5 11/10/2020   HCT 40.3 11/10/2020   MCV 97.3 11/10/2020   PLT 242 11/10/2020   Lab Results  Component Value Date   NA 138 11/10/2020   K 3.3 (L) 11/10/2020   CO2 23 11/10/2020   GLUCOSE 109 (H) 11/10/2020   BUN 7 11/10/2020   CREATININE 0.74 11/10/2020    BILITOT 0.6 07/10/2020   ALKPHOS 79 07/10/2020   AST 15 07/10/2020   ALT 15 07/10/2020   PROT 7.4 07/10/2020   ALBUMIN 4.0 07/10/2020   CALCIUM 9.4 11/10/2020   ANIONGAP 7 11/10/2020   Lab Results  Component Value Date   CHOL 181 12/23/2013   Lab Results  Component Value Date   HDL 75 12/23/2013   Lab Results  Component Value Date   LDLCALC 85 12/23/2013   Lab Results  Component Value Date   TRIG 103 12/23/2013   Lab Results  Component Value Date   CHOLHDL 2.4 12/23/2013   Lab Results  Component Value Date   HGBA1C 5.4 03/18/2018      Assessment & Plan:   Problem List Items Addressed This Visit       Other   Anxiety and depression    Worsening anxiety and depression recently due to chronic pain and difficult financial situation. Begin citalopram 20 mg daily. Go to the open access [facility] to see a mental health provider on open access days. Follow up in 4 months.        Relevant Medications   citalopram (CELEXA) 20 MG tablet   Arthralgia - Primary    Chronic pain with no improvement on current treatment.  Advised to follow up with pain management clinic.  Consider contacting social security office to begin the process of applying for disability. Rheumatoid labs drawn today. Follow up in 4 months.        Relevant Orders   C-reactive protein   ANA,IFA RA Diag Pnl w/rflx Tit/Patn   Sedimentation Rate    Meds ordered this encounter  Medications   citalopram (CELEXA) 20 MG tablet    Sig: Take 1  tablet (20 mg total) by mouth daily.    Dispense:  30 tablet    Refill:  3    Follow-up: Return in about 4 months (around 05/05/2021).   36 min spent on interview, exam, education, complex decision making Asencion [facility]

## 2021-01-03 NOTE — Progress Notes (Signed)
Pt states her pain is coming from her whole body

## 2021-01-09 ENCOUNTER — Telehealth: Payer: Self-pay | Admitting: Critical Care Medicine

## 2021-01-09 NOTE — Telephone Encounter (Signed)
Copied from [place] [id number]. Topic: General - Other >> Jan 09, 2021  9:57 AM Leward Quan A wrote: Reason for CRM: Patient called in to inquire of Dr Joya Gaskins why have [facility] still not received the information requested since February 2022. Patient states that she have not worked in several weeks due to her condition states that since she is not working and have not been paid due to Dr Joya Gaskins not getting the information to [employer] she is on the verge of being homeless. Asking Dr Joya Gaskins or his nurse to please call her today this is very very important. Call  Ph# [phone]

## 2021-01-10 NOTE — Telephone Encounter (Signed)
Please assist with paperwork. I am not onsite to assist.

## 2021-01-11 LAB — ANA,IFA RA DIAG PNL W/RFLX TIT/PATN
ANA Titer 1: NEGATIVE
Cyclic Citrullin Peptide Ab: 14 units (ref 0–19)
Rheumatoid fact SerPl-aCnc: <10 IU/mL (ref <14.0)

## 2021-01-11 LAB — SEDIMENTATION RATE: Sed Rate: 5 mm/hr (ref 0–32)

## 2021-01-11 LAB — C-REACTIVE PROTEIN: CRP: 8 mg/L (ref 0–10)

## 2021-01-11 NOTE — Telephone Encounter (Signed)
Returned pt call. Pt states she was told by [employer] that they have faxed [place] the papers several times. Made pt aware that we haven't received any paperwork for her. Made pt aware that it could have possibly been where we was receiving a fax and it didn't come through. Pt states the person that was helping her is on vacation. Pt states she will apply for disability. Pt asked about MRI for breast made pt aware that order was in and she can contact [facility] to schedule. Pt doesn't have any questions or concerns

## 2021-01-11 NOTE — Telephone Encounter (Signed)
I have not received any paperwork from [facility] [place] ,  I saw the patient 6/22 and recommended she proceed with disability determinations.   Her labs do not show inflammation in joints so must be osteoarthritis only.  She was asked to go to her pain management practice again.  We did not have any paper work from [place] [place] to fill out at that visit.

## 2021-01-12 ENCOUNTER — Telehealth: Payer: Self-pay | Admitting: Critical Care Medicine

## 2021-01-12 NOTE — Telephone Encounter (Signed)
Copied from [place] [id number]. Topic: General - Other >> Jan 12, 2021  3:09 PM Leward Quan A wrote: Reason for CRM: Claiborne Billings with [facility] asking for Dr Joya Gaskins to give a call back in the next week to discuss patient can be reached at Ph# [phone] claim # [id number]

## 2021-01-18 NOTE — Telephone Encounter (Signed)
Caitlin Valdez with [facility] is calling to check on the status of the medical records. Advised that the request was received.  Wanting to know was the pt seen between 67/31/21- Present for Neck & Back Condition. Pt is stating that she unable to work.CB- [phone]

## 2021-01-19 NOTE — Telephone Encounter (Signed)
Spoke with Lattie Haw with [facility] and advise them to call [phone] the HIM dept does our medical records.

## 2021-01-24 ENCOUNTER — Telehealth: Payer: Self-pay | Admitting: Specialist

## 2021-01-24 ENCOUNTER — Other Ambulatory Visit: Payer: Self-pay | Admitting: Orthopaedic Surgery

## 2021-01-24 ENCOUNTER — Other Ambulatory Visit: Payer: Self-pay | Admitting: Specialist

## 2021-01-24 NOTE — Telephone Encounter (Signed)
Ok to schedule with Dr. Louanne Skye if he approves?

## 2021-01-24 NOTE — Telephone Encounter (Signed)
Patient came into office requesting an appointment with Dr.Nitka. Pt states she do not want to see Dr.Yates again. Please advise if we can schedule with Nitka.

## 2021-01-30 ENCOUNTER — Telehealth: Payer: Self-pay | Admitting: Critical Care Medicine

## 2021-01-30 NOTE — Telephone Encounter (Signed)
Pt has been called and a VM was left informing her that virtual visit is needed to do paperwork. Appointment has been set.

## 2021-01-30 NOTE — Telephone Encounter (Signed)
Received faxed from [facility]. The records have been release and the form has been placed in Dr. Joya Gaskins box to review and complete.

## 2021-02-01 ENCOUNTER — Other Ambulatory Visit: Payer: Self-pay

## 2021-02-01 ENCOUNTER — Ambulatory Visit: Payer: BC Managed Care – PPO | Attending: Critical Care Medicine | Admitting: Critical Care Medicine

## 2021-02-01 ENCOUNTER — Encounter: Payer: Self-pay | Admitting: Critical Care Medicine

## 2021-02-01 DIAGNOSIS — G894 Chronic pain syndrome: Secondary | ICD-10-CM

## 2021-02-01 DIAGNOSIS — Z9889 Other specified postprocedural states: Secondary | ICD-10-CM

## 2021-02-01 DIAGNOSIS — G839 Paralytic syndrome, unspecified: Secondary | ICD-10-CM | POA: Diagnosis not present

## 2021-02-01 DIAGNOSIS — M5116 Intervertebral disc disorders with radiculopathy, lumbar region: Secondary | ICD-10-CM

## 2021-02-01 DIAGNOSIS — G8929 Other chronic pain: Secondary | ICD-10-CM | POA: Insufficient documentation

## 2021-02-01 DIAGNOSIS — Z981 Arthrodesis status: Secondary | ICD-10-CM

## 2021-02-01 DIAGNOSIS — M797 Fibromyalgia: Secondary | ICD-10-CM | POA: Diagnosis not present

## 2021-02-01 NOTE — Assessment & Plan Note (Signed)
Chronic pain due to multiple etiologies including cervical spine stenosis lumbar radiculopathy fibromyalgia.  Note rheumatologic studies are negative  Patient also has severe anxiety depression as an overlay.  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  She already had work accommodation paperwork filled out May 31 additional documentation sent into the insurance

## 2021-02-01 NOTE — Progress Notes (Signed)
Established Patient Office Visit  Subjective:  Patient ID: Caitlin Valdez, female    DOB: Nov 28, 1970  Age: 50 y.o. MRN: [id number] Virtual Visit via Telephone Note  I connected with Caitlin Valdez on 02/01/21 at  8:30 AM EDT by telephone and verified that I am speaking with the correct person using two identifiers.   Consent:  I discussed the limitations, risks, security and privacy concerns of performing an evaluation and management service by telephone and the availability of in person appointments. I also discussed with the patient that there may be a patient responsible charge related to this service. The patient expressed understanding and agreed to proceed.  Location of patient: Patient is in her car parked  Location of provider: I am in my office  Persons participating in the televisit with the patient.    No one else on the call   History of Present Illness: CC: chronic pain   HPI 01/03/21 Caitlin Valdez presents for follow up regarding diffuse bodily pain. The patient has a history of cervical spinal fusion, cervical spinal stenosis, sciatic nerve pain, radiculopathy due to lumbar intervertebral disc disorder, and fibromyalgia. The patient has been experiencing chronic, 10/10 pain in all areas of her body. The pain is worse when moving, changing positions, and touching any of the areas. The pain is so severe she is unable to sleep at all. The patient takes goodie powder and Aleve to treat the pain which is somewhat helpful. Taking Robaxin three times daily has not been helpful. The patient saw a pain management clinic in the past and was prescribed Percocet. However, the patient is now unable to afford the copay for her visits to the pain clinic. The patient recently was placed on a leave of absence from her job until September. She would like to apply for disability.   The patient has been experiencing significant anxiety and depression due to her financial struggles and pain. She  would like to see a mental health specialist to manage her symptoms. The patient states she took a medication for her mental health about 6 years ago but does not remember what this medication was.  02/01/2021 This patient is seen in return follow-up by way of a phone visit.  Due to lack of finances she is yet to get an appointment with chronic pain management clinic.  Patient has paperwork from her long-term disability insurance company she needs to have filled out.  I went over with her all of her current symptoms.  She is not able to reach grab her grass because of hand pain and arm pain.  She cannot sit for long periods of time she cannot lift more than 5 pounds for only brief periods  Patient has a history of fibromyalgia previously diagnosed she also has cervical spine stenosis and lumbar disc disease.  She has had lumbar microdiscectomy and cervical spine fusion and discectomies.  This was all done by Dr. Rodell Perna of orthospine.  Despite this she still has chronic pain.  We did a complete rheumatologic survey and was negative.  Patient is concerned about the possibility of multiple sclerosis although her symptoms are very atypical for this.  She is requesting a neurology second opinion.   Past Medical History:  Diagnosis Date   Abdominal pain, right lower quadrant    Anxiety    Arthritis    Depression    Fibromyalgia    GERD (gastroesophageal reflux disease)    Migraine    Obesity  Peripheral vascular disease (HCC)    RIGHT LEG   PONV (postoperative nausea and vomiting)    PTSD (post-traumatic stress disorder)    Stroke ([place])    many years ago - no residual   Trichomonas 2012   Varicose veins of leg with swelling, right    "sometimes swelling."   Vitamin D deficiency     Past Surgical History:  Procedure Laterality Date   ANTERIOR CERVICAL DECOMP/DISCECTOMY FUSION N/A 02/16/2020   Procedure: CERVICAL FIVE THROUGH CERVICAL SIX, CERVICAL SIX  THROUGH CERVICAL SEVEN ANTERIOR  CERVICAL DECOMPRESSION/DISCECTOMY FUSION, ALLOGRAFT, PLATE;  Surgeon: Marybelle Killings, MD;  Location: [facility];  Service: Orthopedics;  Laterality: N/A;   CESAREAN SECTION     FRACTURE SURGERY     HYSTEROSCOPY WITH NOVASURE N/A 08/08/2014   Procedure:  NOVASURE;  Surgeon: Emily Filbert, MD;  Location: [facility];  Service: Gynecology;  Laterality: N/A;   LUMBAR LAMINECTOMY/DECOMPRESSION MICRODISCECTOMY N/A 09/27/2019   Procedure: RIGHT LUMBAR FIVE TO SACRAL ONE MICRODISCECTOMY;  Surgeon: Marybelle Killings, MD;  Location: [facility];  Service: Orthopedics;  Laterality: N/A;   VEIN SURGERY      Family History  Problem Relation Age of Onset   Diabetes Mother    Hypertension Mother    Breast cancer Mother 12   Diabetes Father    Hypertension Father    Diabetes Other    Hypertension Other    Other Neg Hx     Social History   Socioeconomic History   Marital status: Single    Spouse name: Not on file   Number of children: Not on file   Years of education: Not on file   Highest education level: Not on file  Occupational History   Not on file  Tobacco Use   Smoking status: Every Day    Packs/day: 1.00    Years: 5.00    Pack years: 5.00    Types: Cigarettes   Smokeless tobacco: Never   Tobacco comments:    trying to quit  Vaping Use   Vaping Use: Never used  Substance and Sexual Activity   Alcohol use: No   Drug use: Yes    Types: Marijuana    Comment: ocassional use for pain - 02/12/20   Sexual activity: Yes    Birth control/protection: Condom  Other Topics Concern   Not on file  Social History Narrative   Not on file   Social Determinants of Health   Financial Resource Strain: Not on file  Food Insecurity: Not on file  Transportation Needs: Not on file  Physical Activity: Not on file  Stress: Not on file  Social Connections: Not on file  Intimate Partner Violence: Not on file    Outpatient Medications Prior to Visit  Medication Sig Dispense Refill   amitriptyline (ELAVIL) 10 MG  tablet Take 2 tablets (20 mg total) by mouth at bedtime. 60 tablet 1   citalopram (CELEXA) 20 MG tablet Take 1 tablet (20 mg total) by mouth daily. 30 tablet 3   diclofenac (CATAFLAM) 50 MG tablet Take 50 mg by mouth 3 (three) times daily.     methocarbamol (ROBAXIN) 500 MG tablet Take 1 tablet (500 mg total) by mouth every 8 (eight) hours as needed for muscle spasms. 90 tablet 3   metoCLOPramide (REGLAN) 10 MG tablet Take 1 tablet (10 mg total) by mouth every 6 (six) hours as needed for nausea (nausea/headache). 30 tablet 1   Multiple Vitamin (MULTIVITAMIN WITH MINERALS) TABS tablet Take  1 tablet by mouth daily. (Patient not taking: No sig reported)     omeprazole (PRILOSEC) 40 MG capsule Take 1 capsule (40 mg total) by mouth daily. 30 capsule 3   Vitamin D, Ergocalciferol, (DRISDOL) 1.25 MG (50000 UNIT) CAPS capsule Take 1 capsule (50,000 Units total) by mouth every 7 (seven) days. 12 capsule 1   No facility-administered medications prior to visit.    Allergies  Allergen Reactions   Latex Hives   Lyrica [Pregabalin] Swelling and Other (See Comments)    Her report: tongue swelling   Tramadol Nausea Only    Feel sick    ROS Review of Systems  Constitutional:  Negative for chills and fever.  HENT:  Negative for congestion and rhinorrhea.   Eyes: Negative.   Respiratory:  Negative for cough, shortness of breath and wheezing.   Cardiovascular:  Negative for chest pain.  Gastrointestinal:  Negative for abdominal pain, diarrhea, nausea and vomiting.  Genitourinary: Negative.   Musculoskeletal:  Positive for arthralgias, back pain, gait problem, joint swelling and myalgias.  Neurological:  Positive for weakness. Negative for headaches.  Psychiatric/Behavioral:  Negative for suicidal ideas. The patient is nervous/anxious.      Objective:    No exam this is a phone visit  There were no vitals taken for this visit. Wt Readings from Last 3 Encounters:  01/03/21 181 lb 9.6 oz (82.4 kg)   08/16/20 186 lb (84.4 kg)  07/28/20 191 lb 6.4 oz (86.8 kg)     Health Maintenance Due  Topic Date Due   Pneumococcal Vaccine 67-44 Years old (1 - PCV) Never done   COVID-19 Vaccine (3 - Booster) 08/15/2020    There are no preventive care reminders to display for this patient.  Lab Results  Component Value Date   TSH 0.780 04/29/2019   Lab Results  Component Value Date   WBC 8.2 11/10/2020   HGB 13.5 11/10/2020   HCT 40.3 11/10/2020   MCV 97.3 11/10/2020   PLT 242 11/10/2020   Lab Results  Component Value Date   NA 138 11/10/2020   K 3.3 (L) 11/10/2020   CO2 23 11/10/2020   GLUCOSE 109 (H) 11/10/2020   BUN 7 11/10/2020   CREATININE 0.74 11/10/2020   BILITOT 0.6 07/10/2020   ALKPHOS 79 07/10/2020   AST 15 07/10/2020   ALT 15 07/10/2020   PROT 7.4 07/10/2020   ALBUMIN 4.0 07/10/2020   CALCIUM 9.4 11/10/2020   ANIONGAP 7 11/10/2020   Lab Results  Component Value Date   CHOL 181 12/23/2013   Lab Results  Component Value Date   HDL 75 12/23/2013   Lab Results  Component Value Date   LDLCALC 85 12/23/2013   Lab Results  Component Value Date   TRIG 103 12/23/2013   Lab Results  Component Value Date   CHOLHDL 2.4 12/23/2013   Lab Results  Component Value Date   HGBA1C 5.4 03/18/2018      Assessment & Plan:   Problem List Items Addressed This Visit       Nervous and Auditory   Radiculopathy due to lumbar intervertebral disc disorder   Relevant Orders   Ambulatory referral to Neurology     Other   Fibromyalgia - Primary (Chronic)   Relevant Medications   diclofenac (CATAFLAM) 50 MG tablet   Other Relevant Orders   Ambulatory referral to Neurology   S/P lumbar microdiscectomy   Relevant Orders   Ambulatory referral to Neurology   S/P cervical spinal fusion  Relevant Orders   Ambulatory referral to Neurology   Chronic pain    Chronic pain due to multiple etiologies including cervical spine stenosis lumbar radiculopathy fibromyalgia.   Note rheumatologic studies are negative  Patient also has severe anxiety depression as an overlay.  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  She already had work accommodation paperwork filled out May 31 additional documentation sent into the insurance       Relevant Medications   diclofenac (CATAFLAM) 50 MG tablet   Other Visit Diagnoses     Motor level spinal weakness ([place])       Relevant Orders   Ambulatory referral to Neurology      No orders of the defined types were placed in this encounter.    Follow Up Instructions: Patient knows a neurology referral will occur and will fill out her paperwork and send it to the insurance cavity return follow-up here in this clinic in 3 months   I discussed the assessment and treatment plan with the patient. The patient was provided an opportunity to ask questions and all were answered. The patient agreed with the plan and demonstrated an understanding of the instructions.   The patient was advised to call back or seek an in-person evaluation if the symptoms worsen or if the condition fails to improve as anticipated.  I provided 21 minutes of non-face-to-face time during this encounter  including  median intraservice time , review of notes, labs, imaging, medications  and explaining diagnosis and management to the patient .    Asencion Noble, MD   Follow-up: Return in about 3 months (around 05/04/2021).

## 2021-02-06 ENCOUNTER — Telehealth: Payer: BC Managed Care – PPO | Admitting: Critical Care Medicine

## 2021-02-21 ENCOUNTER — Ambulatory Visit: Payer: BC Managed Care – PPO | Admitting: Critical Care Medicine

## 2021-04-11 ENCOUNTER — Ambulatory Visit: Payer: BC Managed Care – PPO | Admitting: Specialist

## 2021-04-26 ENCOUNTER — Other Ambulatory Visit (HOSPITAL_COMMUNITY)
Admission: RE | Admit: 2021-04-26 | Discharge: 2021-04-26 | Disposition: A | Discharge status: Home / Self Care | Payer: BC Managed Care – PPO | Source: Ambulatory Visit | Attending: Internal Medicine | Admitting: Internal Medicine

## 2021-04-26 ENCOUNTER — Other Ambulatory Visit: Payer: Self-pay

## 2021-04-26 ENCOUNTER — Ambulatory Visit: Payer: BC Managed Care – PPO | Attending: Internal Medicine | Admitting: Internal Medicine

## 2021-04-26 VITALS — BP 141/85 | HR 86 | Resp 16 | Wt 186.8 lb

## 2021-04-26 DIAGNOSIS — N898 Other specified noninflammatory disorders of vagina: Secondary | ICD-10-CM

## 2021-04-26 DIAGNOSIS — M5441 Lumbago with sciatica, right side: Secondary | ICD-10-CM

## 2021-04-26 DIAGNOSIS — Z114 Encounter for screening for human immunodeficiency virus [HIV]: Secondary | ICD-10-CM

## 2021-04-26 DIAGNOSIS — M5442 Lumbago with sciatica, left side: Secondary | ICD-10-CM

## 2021-04-26 DIAGNOSIS — G8929 Other chronic pain: Secondary | ICD-10-CM

## 2021-04-26 MED ORDER — KETOROLAC TROMETHAMINE 60 MG/2ML IM SOLN: 60.0000 mg | Freq: Once | INTRAMUSCULAR | Status: AC

## 2021-04-26 NOTE — Progress Notes (Signed)
Patient ID: KELLIANNE Valdez, female    DOB: May 08, 1971  MRN: [id number]  CC: std screening   Subjective: Caitlin Valdez is a 50 y.o. female who presents for urgent care visit for std screen Her concerns today include:   Patient presents today requesting STD screen.  She is sexually active with 1 female partner.  A few days ago during intercourse, she thinks his condom slipped off and is retained in the vagina.  She endorses vaginal odor and white discharge.    Request toradol shot for chronic pain from sciatica.  Patient Active Problem List   Diagnosis Date Noted   Chronic pain 02/01/2021   Arthralgia 01/03/2021   Nipple discharge 12/20/2020   Abnormal ultrasound of breast 12/20/2020   Numbness of upper extremity 04/19/2020   S/P cervical spinal fusion 04/19/2020   Cervical spinal stenosis 02/16/2020   Gastroesophageal reflux disease without esophagitis 02/01/2020   S/P lumbar microdiscectomy 01/04/2020   Chronic low back pain with sciatica 08/10/2019   Radiculopathy due to lumbar intervertebral disc disorder 06/22/2019   Anxiety and depression 03/17/2018   S/P endometrial ablation 06/27/2016   Migraine without status migrainosus, not intractable 06/27/2016   Vitamin D insufficiency 02/11/2016   De Quervain's tenosynovitis, left 02/09/2016   Vitamin B12 deficiency 10/17/2015   Allergic rhinitis 12/29/2014   Current smoker 11/22/2014   Obesity 06/29/2014   Exposure to STD 05/09/2014   Fibromyalgia 07/15/2009     Current Outpatient Medications on File Prior to Visit  Medication Sig Dispense Refill   amitriptyline (ELAVIL) 10 MG tablet Take 2 tablets (20 mg total) by mouth at bedtime. 60 tablet 1   citalopram (CELEXA) 20 MG tablet Take 1 tablet (20 mg total) by mouth daily. 30 tablet 3   diclofenac (CATAFLAM) 50 MG tablet Take 50 mg by mouth 3 (three) times daily.     methocarbamol (ROBAXIN) 500 MG tablet Take 1 tablet (500 mg total) by mouth every 8 (eight) hours as needed for  muscle spasms. 90 tablet 3   metoCLOPramide (REGLAN) 10 MG tablet Take 1 tablet (10 mg total) by mouth every 6 (six) hours as needed for nausea (nausea/headache). 30 tablet 1   Multiple Vitamin (MULTIVITAMIN WITH MINERALS) TABS tablet Take 1 tablet by mouth daily. (Patient not taking: No sig reported)     omeprazole (PRILOSEC) 40 MG capsule Take 1 capsule (40 mg total) by mouth daily. 30 capsule 3   Vitamin D, Ergocalciferol, (DRISDOL) 1.25 MG (50000 UNIT) CAPS capsule Take 1 capsule (50,000 Units total) by mouth every 7 (seven) days. 12 capsule 1   No current facility-administered medications on file prior to visit.    Allergies  Allergen Reactions   Latex Hives   Lyrica [Pregabalin] Swelling and Other (See Comments)    Her report: tongue swelling   Tramadol Nausea Only    Feel sick    Social History   Socioeconomic History   Marital status: Single    Spouse name: Not on file   Number of children: Not on file   Years of education: Not on file   Highest education level: Not on file  Occupational History   Not on file  Tobacco Use   Smoking status: Every Day    Packs/day: 1.00    Years: 5.00    Pack years: 5.00    Types: Cigarettes   Smokeless tobacco: Never   Tobacco comments:    trying to quit  Vaping Use   Vaping Use: Never used  Substance and Sexual Activity   Alcohol use: No   Drug use: Yes    Types: Marijuana    Comment: ocassional use for pain - 02/12/20   Sexual activity: Yes    Birth control/protection: Condom  Other Topics Concern   Not on file  Social History Narrative   Not on file   Social Determinants of Health   Financial Resource Strain: Not on file  Food Insecurity: Not on file  Transportation Needs: Not on file  Physical Activity: Not on file  Stress: Not on file  Social Connections: Not on file  Intimate Partner Violence: Not on file    Family History  Problem Relation Age of Onset   Diabetes Mother    Hypertension Mother    Breast  cancer Mother 68   Diabetes Father    Hypertension Father    Diabetes Other    Hypertension Other    Other Neg Hx     Past Surgical History:  Procedure Laterality Date   ANTERIOR CERVICAL DECOMP/DISCECTOMY FUSION N/A 02/16/2020   Procedure: CERVICAL FIVE THROUGH CERVICAL SIX, CERVICAL SIX  THROUGH CERVICAL SEVEN ANTERIOR CERVICAL DECOMPRESSION/DISCECTOMY FUSION, ALLOGRAFT, PLATE;  Surgeon: Marybelle Killings, MD;  Location: [facility];  Service: Orthopedics;  Laterality: N/A;   CESAREAN SECTION     FRACTURE SURGERY     HYSTEROSCOPY WITH NOVASURE N/A 08/08/2014   Procedure:  NOVASURE;  Surgeon: Emily Filbert, MD;  Location: [facility];  Service: Gynecology;  Laterality: N/A;   LUMBAR LAMINECTOMY/DECOMPRESSION MICRODISCECTOMY N/A 09/27/2019   Procedure: RIGHT LUMBAR FIVE TO SACRAL ONE MICRODISCECTOMY;  Surgeon: Marybelle Killings, MD;  Location: [facility];  Service: Orthopedics;  Laterality: N/A;   VEIN SURGERY      ROS: Review of Systems Negative except as stated above  PHYSICAL EXAM: BP (!) 141/85   Pulse 86   Resp 16   Wt 186 lb 12.8 oz (84.7 kg)   SpO2 97%   BMI 33.09 kg/m   Physical Exam  General appearance - alert, well appearing, and in no distress Pelvic -CMA Pollock present for exam: No external vaginal lesions noted.  No condom or other routine object seen in the vaginal vault.  She has small amount of thin yellowish discharge in the vagina.  Cervix appears grossly normal.   CMP Latest Ref Rng & Units 11/10/2020 07/10/2020 02/01/2020  Glucose 70 - 99 mg/dL 109(H) 94 89  BUN 6 - 20 mg/dL 7 8 6   Creatinine 0.44 - 1.00 mg/dL 0.74 0.81 0.86  Sodium 135 - 145 mmol/L 138 135 139  Potassium 3.5 - 5.1 mmol/L 3.3(L) 4.3 4.7  Chloride 98 - 111 mmol/L 108 105 102  CO2 22 - 32 mmol/L 23 20(L) 21  Calcium 8.9 - 10.3 mg/dL 9.4 9.7 10.0  Total Protein 6.5 - 8.1 g/dL - 7.4 6.9  Total Bilirubin 0.3 - 1.2 mg/dL - 0.6 0.3  Alkaline Phos 38 - 126 U/L - 79 97  AST 15 - 41 U/L - 15 16  ALT 0 - 44 U/L - 15 13    Lipid Panel     Component Value Date/Time   CHOL 181 12/23/2013 1720   TRIG 103 12/23/2013 1720   HDL 75 12/23/2013 1720   CHOLHDL 2.4 12/23/2013 1720   VLDL 21 12/23/2013 1720   LDLCALC 85 12/23/2013 1720    CBC    Component Value Date/Time   WBC 8.2 11/10/2020 1720   RBC 4.14 11/10/2020 1720   HGB 13.5 11/10/2020  1720   HGB 15.3 02/01/2020 1614   HCT 40.3 11/10/2020 1720   HCT 44.8 02/01/2020 1614   PLT 242 11/10/2020 1720   PLT 262 02/01/2020 1614   MCV 97.3 11/10/2020 1720   MCV 93 02/01/2020 1614   MCH 32.6 11/10/2020 1720   MCHC 33.5 11/10/2020 1720   RDW 12.1 11/10/2020 1720   RDW 12.2 02/01/2020 1614   LYMPHSABS 4.0 11/10/2020 1720   LYMPHSABS 4.4 (H) 02/01/2020 1614   MONOABS 0.6 11/10/2020 1720   EOSABS 0.1 11/10/2020 1720   EOSABS 0.1 02/01/2020 1614   BASOSABS 0.0 11/10/2020 1720   BASOSABS 0.0 02/01/2020 1614    ASSESSMENT AND PLAN: 1. Vaginal discharge -Patient informed that no retained condom found in the vagina. - Cervicovaginal ancillary only  2. Screening for HIV (human immunodeficiency virus) Patient agreeable to HIV screening. - HIV antibody (with reflex)  3. Chronic midline low back pain with bilateral sciatica - ketorolac (TORADOL) injection 60 mg    Patient was given the opportunity to ask questions.  Patient verbalized understanding of the plan and was able to repeat key elements of the plan.   No orders of the defined types were placed in this encounter.    Requested Prescriptions    No prescriptions requested or ordered in this encounter    No follow-ups on file.  Karle Plumber, MD, FACP

## 2021-04-26 NOTE — Progress Notes (Signed)
Pt would like a pelvic exam pt states she think she has a condom stuck

## 2021-04-27 LAB — HIV ANTIBODY (ROUTINE TESTING W REFLEX): HIV Screen 4th Generation wRfx: NONREACTIVE

## 2021-04-30 ENCOUNTER — Other Ambulatory Visit: Payer: Self-pay | Admitting: Internal Medicine

## 2021-04-30 LAB — CERVICOVAGINAL ANCILLARY ONLY
Bacterial Vaginitis (gardnerella): POSITIVE — AB
Candida Glabrata: NEGATIVE
Candida Vaginitis: NEGATIVE
Chlamydia: NEGATIVE
Comment: NEGATIVE
Comment: NEGATIVE
Comment: NEGATIVE
Comment: NEGATIVE
Comment: NEGATIVE
Comment: NORMAL
Neisseria Gonorrhea: NEGATIVE
Trichomonas: NEGATIVE

## 2021-04-30 MED ORDER — METRONIDAZOLE 500 MG PO TABS: 500.0000 mg | ORAL_TABLET | Freq: Two times a day (BID) | ORAL | 0 refills | Status: DC

## 2021-05-15 ENCOUNTER — Telehealth: Payer: Self-pay | Admitting: Critical Care Medicine

## 2021-05-15 ENCOUNTER — Other Ambulatory Visit: Payer: Self-pay

## 2021-05-15 MED ORDER — FLUCONAZOLE 100 MG PO TABS
ORAL_TABLET | ORAL | 0 refills | Status: DC
  Filled 2021-05-15: qty 7, 5d supply, fill #0

## 2021-05-15 MED ORDER — CLINDAMYCIN HCL 300 MG PO CAPS
300.0000 mg | ORAL_CAPSULE | Freq: Two times a day (BID) | ORAL | 0 refills | Status: DC
Start: 2021-05-15 — End: 2021-05-15
  Filled 2021-05-15: qty 14, 7d supply, fill #0

## 2021-05-15 MED ORDER — CLINDAMYCIN HCL 300 MG PO CAPS: 300.0000 mg | ORAL_CAPSULE | Freq: Two times a day (BID) | ORAL | 0 refills | Status: DC

## 2021-05-15 MED ORDER — FLUCONAZOLE 100 MG PO TABS
ORAL_TABLET | ORAL | 0 refills | Status: DC
Start: 2021-05-15 — End: 2021-09-18

## 2021-05-15 NOTE — Telephone Encounter (Signed)
Pt unimproved. Sent clindamycin bid x 7days and oral fluconazole for BV and vaginal yeast  I did speak to the pt. Carly let her know rx went to our pharmacy

## 2021-05-15 NOTE — Telephone Encounter (Signed)
Copied from [place] [id number]. Topic: General - Other >> May 14, 2021 10:12 AM Valere Dross wrote: Reason for CRM: Pt stated the medication she was giving did not help, and wants to see about getting something different, please advise.   She also has a yeast infection with vaginal itching and burning from the medication and needs something called in right away / please advise

## 2021-05-15 NOTE — Telephone Encounter (Signed)
Pt was called and voicemail was left.

## 2021-05-15 NOTE — Addendum Note (Signed)
Addended by: Asencion Noble E on: 05/15/2021 01:47 PM   Modules accepted: Orders

## 2021-05-18 ENCOUNTER — Encounter: Payer: BC Managed Care – PPO | Admitting: Internal Medicine

## 2021-06-20 ENCOUNTER — Telehealth: Payer: Self-pay | Admitting: Critical Care Medicine

## 2021-06-20 NOTE — Telephone Encounter (Signed)
I am not there will do tomorrow

## 2021-06-20 NOTE — Telephone Encounter (Signed)
Copied from [place] [id number]. Topic: General - Other >> Jun 20, 2021  8:53 AM Leward Quan A wrote: Reason for CRM: Patient called in asking for a handicapped placard form to be completed for pick up today from Dr Joya Gaskins since she is in [place]. Please call patient at Ph# [phone]

## 2021-06-20 NOTE — Telephone Encounter (Signed)
Will send to PCP 

## 2021-06-21 ENCOUNTER — Telehealth: Payer: Self-pay | Admitting: Critical Care Medicine

## 2021-06-21 NOTE — Telephone Encounter (Signed)
Placement card was not in the paperwork picked up by pt and would like to know when will it be ready

## 2021-06-21 NOTE — Telephone Encounter (Signed)
Will call patient to let her know it will be 7-10 business days

## 2021-07-12 ENCOUNTER — Telehealth: Payer: Self-pay

## 2021-07-12 NOTE — Telephone Encounter (Signed)
Pt came yesterday paperwork and also mentioned that she need a referral to neurology and Gastroenterology, talked with Dr.Wright and he is requesting a office visit for referral. I called and left vm

## 2021-07-15 NOTE — Progress Notes (Incomplete)
Established Patient Office Visit  Subjective:  Patient ID: Caitlin Valdez, female    DOB: 1970/09/07  Age: 51 y.o. MRN: [id number]  CC: No chief complaint on file.   HPI Caitlin Valdez presents for  Flu pcv  Wants Neuro/GI referral  Had STD visit 04/2021  Past Medical History:  Diagnosis Date   Abdominal pain, right lower quadrant    Anxiety    Arthritis    Depression    Fibromyalgia    GERD (gastroesophageal reflux disease)    Migraine    Obesity    Peripheral vascular disease (HCC)    RIGHT LEG   PONV (postoperative nausea and vomiting)    PTSD (post-traumatic stress disorder)    Stroke ([place])    many years ago - no residual   Trichomonas 2012   Varicose veins of leg with swelling, right    "sometimes swelling."   Vitamin D deficiency     Past Surgical History:  Procedure Laterality Date   ANTERIOR CERVICAL DECOMP/DISCECTOMY FUSION N/A 02/16/2020   Procedure: CERVICAL FIVE THROUGH CERVICAL SIX, CERVICAL SIX  THROUGH CERVICAL SEVEN ANTERIOR CERVICAL DECOMPRESSION/DISCECTOMY FUSION, ALLOGRAFT, PLATE;  Surgeon: Marybelle Killings, MD;  Location: [facility];  Service: Orthopedics;  Laterality: N/A;   CESAREAN SECTION     FRACTURE SURGERY     HYSTEROSCOPY WITH NOVASURE N/A 08/08/2014   Procedure:  NOVASURE;  Surgeon: Emily Filbert, MD;  Location: [facility];  Service: Gynecology;  Laterality: N/A;   LUMBAR LAMINECTOMY/DECOMPRESSION MICRODISCECTOMY N/A 09/27/2019   Procedure: RIGHT LUMBAR FIVE TO SACRAL ONE MICRODISCECTOMY;  Surgeon: Marybelle Killings, MD;  Location: [facility];  Service: Orthopedics;  Laterality: N/A;   VEIN SURGERY      Family History  Problem Relation Age of Onset   Diabetes Mother    Hypertension Mother    Breast cancer Mother 76   Diabetes Father    Hypertension Father    Diabetes Other    Hypertension Other    Other Neg Hx     Social History   Socioeconomic History   Marital status: Single    Spouse name: Not on file   Number of  children: Not on file   Years of education: Not on file   Highest education level: Not on file  Occupational History   Not on file  Tobacco Use   Smoking status: Every Day    Packs/day: 1.00    Years: 5.00    Pack years: 5.00    Types: Cigarettes   Smokeless tobacco: Never   Tobacco comments:    trying to quit  Vaping Use   Vaping Use: Never used  Substance and Sexual Activity   Alcohol use: No   Drug use: Yes    Types: Marijuana    Comment: ocassional use for pain - 02/12/20   Sexual activity: Yes    Birth control/protection: Condom  Other Topics Concern   Not on file  Social History Narrative   Not on file   Social Determinants of Health   Financial Resource Strain: Not on file  Food Insecurity: Not on file  Transportation Needs: Not on file  Physical Activity: Not on file  Stress: Not on file  Social Connections: Not on file  Intimate Partner Violence: Not on file    Outpatient Medications Prior to Visit  Medication Sig Dispense Refill   amitriptyline (ELAVIL) 10 MG tablet Take 2 tablets (20 mg total) by mouth at bedtime. 60 tablet 1  citalopram (CELEXA) 20 MG tablet Take 1 tablet (20 mg total) by mouth daily. 30 tablet 3   clindamycin (CLEOCIN) 300 MG capsule Take 1 capsule (300 mg total) by mouth 2 (two) times daily. 14 capsule 0   diclofenac (CATAFLAM) 50 MG tablet Take 50 mg by mouth 3 (three) times daily.     fluconazole (DIFLUCAN) 100 MG tablet Take two once then one daily until gone 7 tablet 0   methocarbamol (ROBAXIN) 500 MG tablet Take 1 tablet (500 mg total) by mouth every 8 (eight) hours as needed for muscle spasms. 90 tablet 3   metoCLOPramide (REGLAN) 10 MG tablet Take 1 tablet (10 mg total) by mouth every 6 (six) hours as needed for nausea (nausea/headache). 30 tablet 1   Multiple Vitamin (MULTIVITAMIN WITH MINERALS) TABS tablet Take 1 tablet by mouth daily. (Patient not taking: No sig reported)     omeprazole (PRILOSEC) 40 MG  capsule Take 1 capsule (40 mg total) by mouth daily. 30 capsule 3   Vitamin D, Ergocalciferol, (DRISDOL) 1.25 MG (50000 UNIT) CAPS capsule Take 1 capsule (50,000 Units total) by mouth every 7 (seven) days. 12 capsule 1   No facility-administered medications prior to visit.    Allergies  Allergen Reactions   Latex Hives   Lyrica [Pregabalin] Swelling and Other (See Comments)    Her report: tongue swelling   Tramadol Nausea Only    Feel sick    ROS Review of Systems    Objective:    Physical Exam  There were no vitals taken for this visit. Wt Readings from Last 3 Encounters:  04/26/21 186 lb 12.8 oz (84.7 kg)  01/03/21 181 lb 9.6 oz (82.4 kg)  08/16/20 186 lb (84.4 kg)     Health Maintenance Due  Topic Date Due   Pneumococcal Vaccine 23-52 Years old (1 - PCV) Never done   COVID-19 Vaccine (3 - Booster) 05/10/2020   INFLUENZA VACCINE  02/12/2021    There are no preventive care reminders to display for this patient.  Lab Results  Component Value Date   TSH 0.780 04/29/2019   Lab Results  Component Value Date   WBC 8.2 11/10/2020   HGB 13.5 11/10/2020   HCT 40.3 11/10/2020   MCV 97.3 11/10/2020   PLT 242 11/10/2020   Lab Results  Component Value Date   NA 138 11/10/2020   K 3.3 (L) 11/10/2020   CO2 23 11/10/2020   GLUCOSE 109 (H) 11/10/2020   BUN 7 11/10/2020   CREATININE 0.74 11/10/2020   BILITOT 0.6 07/10/2020   ALKPHOS 79 07/10/2020   AST 15 07/10/2020   ALT 15 07/10/2020   PROT 7.4 07/10/2020   ALBUMIN 4.0 07/10/2020   CALCIUM 9.4 11/10/2020   ANIONGAP 7 11/10/2020   Lab Results  Component Value Date   CHOL 181 12/23/2013   Lab Results  Component Value Date   HDL 75 12/23/2013   Lab Results  Component Value Date   LDLCALC 85 12/23/2013   Lab Results  Component Value Date   TRIG 103 12/23/2013   Lab Results  Component Value Date   CHOLHDL 2.4 12/23/2013   Lab Results  Component Value Date   HGBA1C 5.4 03/18/2018       Assessment & Plan:   Problem List Items Addressed This Visit   None   No orders of the defined types were placed in this encounter.   Follow-up: No follow-ups on file.    Asencion Noble, MD

## 2021-07-17 ENCOUNTER — Ambulatory Visit: Payer: Self-pay | Admitting: Critical Care Medicine

## 2021-09-16 NOTE — Progress Notes (Signed)
Established Patient Office Visit  Subjective:  Patient ID: Caitlin Valdez, female    DOB: May 23, 1971  Age: 51 y.o. MRN: [id number]  CC:  Chief Complaint  Patient presents with   Medication Refill    HPI  01/03/21 [name] presents for follow up regarding diffuse bodily pain. The patient has a history of cervical spinal fusion, cervical spinal stenosis, sciatic nerve pain, radiculopathy due to lumbar intervertebral disc disorder, and fibromyalgia. The patient has been experiencing chronic, 10/10 pain in all areas of her body. The pain is worse when moving, changing positions, and touching any of the areas. The pain is so severe she is unable to sleep at all. The patient takes goodie powder and Aleve to treat the pain which is somewhat helpful. Taking Robaxin three times daily has not been helpful. The patient saw a pain management clinic in the past and was prescribed Percocet. However, the patient is now unable to afford the copay for her visits to the pain clinic. The patient recently was placed on a leave of absence from her job until September. She would like to apply for disability.    The patient has been experiencing significant anxiety and depression due to her financial struggles and pain. She would like to see a mental health specialist to manage her symptoms. The patient states she took a medication for her mental health about 6 years ago but does not remember what this medication was.  02/01/2021 This patient is seen in return follow-up by way of a phone visit.  Due to lack of finances she is yet to get an appointment with chronic pain management clinic.  Patient has paperwork from her long-term disability insurance company she needs to have filled out.  I went over with her all of her current symptoms.  She is not able to reach grab her grass because of hand pain and arm pain.  She cannot sit for long periods of time she cannot lift more than 5 pounds for only brief  periods  Patient has a history of fibromyalgia previously diagnosed she also has cervical spine stenosis and lumbar disc disease.  She has had lumbar microdiscectomy and cervical spine fusion and discectomies.  This was all done by Dr. Rodell Perna of [facility]ospine.  Despite this she still has chronic pain.  We did a complete rheumatologic survey and was negative.  Patient is concerned about the possibility of multiple sclerosis although her symptoms are very atypical for this.  She is requesting a neurology second opinion.   09/18/2021  Caitlin Valdez presents for primary care follow-up.  Patient continues to have diffuse pain in the back neck and lower lumbar areas.  The patient also complains of frequent vaginal yeast infections as well and would like a refill on her fluconazole.  She remains constipated at times.  She is not eating much in the way of high fiber in the diet.  She is requesting potential MRI but would like to wait till she can assure herself that she has insurance which she is applying for now.  Patient does need a colon cancer screening she agrees to repeat this.  When she was seen in October she received a Toradol injection which helped.  She would like another injection and does not agree to receive a flu vaccine. The patient no longer is working she is trying to apply for disability.  She does have heartburn at times.  We try to refer her to neurology in  July but they wanted an MRI of the brain done first.  The patient was concerned about multiple sclerosis.  Past Medical History:  Diagnosis Date   Abdominal pain, right lower quadrant    Anxiety    Arthritis    Depression    Fibromyalgia    GERD (gastroesophageal reflux disease)    Migraine    Obesity    Peripheral vascular disease (HCC)    RIGHT LEG   PONV (postoperative nausea and vomiting)    PTSD (post-traumatic stress disorder)    Stroke ([place])    many years ago - no residual   Trichomonas 2012   Varicose veins of leg  with swelling, right    "sometimes swelling."   Vitamin D deficiency     Past Surgical History:  Procedure Laterality Date   ANTERIOR CERVICAL DECOMP/DISCECTOMY FUSION N/A 02/16/2020   Procedure: CERVICAL FIVE THROUGH CERVICAL SIX, CERVICAL SIX  THROUGH CERVICAL SEVEN ANTERIOR CERVICAL DECOMPRESSION/DISCECTOMY FUSION, ALLOGRAFT, PLATE;  Surgeon: Marybelle Killings, MD;  Location: [facility];  Service: Orthopedics;  Laterality: N/A;   CESAREAN SECTION     FRACTURE SURGERY     HYSTEROSCOPY WITH NOVASURE N/A 08/08/2014   Procedure:  NOVASURE;  Surgeon: Emily Filbert, MD;  Location: [facility];  Service: Gynecology;  Laterality: N/A;   LUMBAR LAMINECTOMY/DECOMPRESSION MICRODISCECTOMY N/A 09/27/2019   Procedure: RIGHT LUMBAR FIVE TO SACRAL ONE MICRODISCECTOMY;  Surgeon: Marybelle Killings, MD;  Location: [facility];  Service: Orthopedics;  Laterality: N/A;   VEIN SURGERY      Family History  Problem Relation Age of Onset   Diabetes Mother    Hypertension Mother    Breast cancer Mother 60   Diabetes Father    Hypertension Father    Diabetes Other    Hypertension Other    Other Neg Hx     Social History   Socioeconomic History   Marital status: Single    Spouse name: Not on file   Number of children: Not on file   Years of education: Not on file   Highest education level: Not on file  Occupational History   Not on file  Tobacco Use   Smoking status: Every Day    Packs/day: 1.00    Years: 5.00    Pack years: 5.00    Types: Cigarettes   Smokeless tobacco: Never   Tobacco comments:    trying to quit  Vaping Use   Vaping Use: Never used  Substance and Sexual Activity   Alcohol use: No   Drug use: Yes    Types: Marijuana    Comment: ocassional use for pain - 02/12/20   Sexual activity: Yes    Birth control/protection: Condom  Other Topics Concern   Not on file  Social History Narrative   Not on file   Social Determinants of Health   Financial Resource Strain: Not on file  Food Insecurity: Not  on file  Transportation Needs: Not on file  Physical Activity: Not on file  Stress: Not on file  Social Connections: Not on file  Intimate Partner Violence: Not on file    Outpatient Medications Prior to Visit  Medication Sig Dispense Refill   Multiple Vitamin (MULTIVITAMIN WITH MINERALS) TABS tablet Take 1 tablet by mouth daily.     amitriptyline (ELAVIL) 10 MG tablet Take 2 tablets (20 mg total) by mouth at bedtime. 60 tablet 1   citalopram (CELEXA) 20 MG tablet Take 1 tablet (20 mg total) by mouth daily. [place]  tablet 3   clindamycin (CLEOCIN) 300 MG capsule Take 1 capsule (300 mg total) by mouth 2 (two) times daily. 14 capsule 0   diclofenac (CATAFLAM) 50 MG tablet Take 50 mg by mouth 3 (three) times daily.     fluconazole (DIFLUCAN) 100 MG tablet Take two once then one daily until gone 7 tablet 0   methocarbamol (ROBAXIN) 500 MG tablet Take 1 tablet (500 mg total) by mouth every 8 (eight) hours as needed for muscle spasms. 90 tablet 3   metoCLOPramide (REGLAN) 10 MG tablet Take 1 tablet (10 mg total) by mouth every 6 (six) hours as needed for nausea (nausea/headache). 30 tablet 1   omeprazole (PRILOSEC) 40 MG capsule Take 1 capsule (40 mg total) by mouth daily. 30 capsule 3   Vitamin D, Ergocalciferol, (DRISDOL) 1.25 MG (50000 UNIT) CAPS capsule Take 1 capsule (50,000 Units total) by mouth every 7 (seven) days. 12 capsule 1   No facility-administered medications prior to visit.    Allergies  Allergen Reactions   Latex Hives   Lyrica [Pregabalin] Swelling and Other (See Comments)    Her report: tongue swelling   Tramadol Nausea Only    Feel sick    ROS Review of Systems  Constitutional: Negative.   HENT: Negative.  Negative for ear pain, postnasal drip, rhinorrhea, sinus pressure, sore throat, trouble swallowing and voice change.   Eyes: Negative.   Respiratory: Negative.  Negative for apnea, cough, choking, chest tightness, shortness of breath, wheezing and stridor.    Cardiovascular: Negative.  Negative for chest pain, palpitations and leg swelling.  Gastrointestinal:  Positive for abdominal distention, abdominal pain and constipation. Negative for nausea and vomiting.  Genitourinary: Negative.   Musculoskeletal:  Positive for arthralgias, back pain, gait problem, myalgias, neck pain and neck stiffness.  Skin: Negative.  Negative for rash.  Allergic/Immunologic: Negative.  Negative for environmental allergies and food allergies.  Neurological:  Negative for dizziness, syncope, weakness and headaches.  Hematological: Negative.  Negative for adenopathy. Does not bruise/bleed easily.  Psychiatric/Behavioral: Negative.  Negative for agitation, sleep disturbance and suicidal ideas. The patient is not nervous/anxious.      Objective:    Physical Exam Vitals reviewed.  Constitutional:      Appearance: Normal appearance. She is well-developed. She is obese. She is not diaphoretic.  HENT:     Head: Normocephalic and atraumatic.     Nose: No nasal deformity, septal deviation, mucosal edema or rhinorrhea.     Right Sinus: No maxillary sinus tenderness or frontal sinus tenderness.     Left Sinus: No maxillary sinus tenderness or frontal sinus tenderness.     Mouth/Throat:     Mouth: Mucous membranes are moist.     Pharynx: Oropharynx is clear. No oropharyngeal exudate.  Eyes:     General: No scleral icterus.    Conjunctiva/sclera: Conjunctivae normal.     Pupils: Pupils are equal, round, and reactive to light.  Neck:     Thyroid: No thyromegaly.     Vascular: No carotid bruit or JVD.     Trachea: Trachea normal. No tracheal tenderness or tracheal deviation.  Cardiovascular:     Rate and Rhythm: Normal rate and regular rhythm.     Chest Wall: PMI is not displaced.     Pulses: Normal pulses. No decreased pulses.     Heart sounds: Normal heart sounds, S1 normal and S2 normal. Heart sounds not distant. No murmur heard. No systolic murmur is present.  No  diastolic murmur  is present.    No friction rub. No gallop. No S3 or S4 sounds.  Pulmonary:     Effort: No tachypnea, accessory muscle usage or respiratory distress.     Breath sounds: No stridor. No decreased breath sounds, wheezing, rhonchi or rales.  Chest:     Chest wall: No tenderness.  Abdominal:     General: Bowel sounds are normal. There is no distension.     Palpations: Abdomen is soft. Abdomen is not rigid.     Tenderness: There is no abdominal tenderness. There is no guarding or rebound.  Musculoskeletal:        General: Tenderness present. Normal range of motion.     Cervical back: Normal range of motion and neck supple. No edema, erythema or rigidity. No muscular tenderness. Normal range of motion.     Comments: Posterior neck tenderness  Lymphadenopathy:     Head:     Right side of head: No submental or submandibular adenopathy.     Left side of head: No submental or submandibular adenopathy.     Cervical: No cervical adenopathy.  Skin:    General: Skin is warm and dry.     Coloration: Skin is not pale.     Findings: No rash.     Nails: There is no clubbing.  Neurological:     Mental Status: She is alert and oriented to person, place, and time.     Sensory: No sensory deficit.  Psychiatric:        Mood and Affect: Mood normal.        Speech: Speech normal.        Behavior: Behavior normal.        Thought Content: Thought content normal.    BP 113/77    Pulse 80    Wt 196 lb 6.4 oz (89.1 kg)    SpO2 99%    BMI 34.79 kg/m  Wt Readings from Last 3 Encounters:  09/18/21 196 lb 6.4 oz (89.1 kg)  04/26/21 186 lb 12.8 oz (84.7 kg)  01/03/21 181 lb 9.6 oz (82.4 kg)     Health Maintenance Due  Topic Date Due   COLON CANCER SCREENING ANNUAL FOBT  08/17/2021    There are no preventive care reminders to display for this patient.  Lab Results  Component Value Date   TSH 0.780 04/29/2019   Lab Results  Component Value Date   WBC 8.2 11/10/2020   HGB 13.5  11/10/2020   HCT 40.3 11/10/2020   MCV 97.3 11/10/2020   PLT 242 11/10/2020   Lab Results  Component Value Date   NA 138 11/10/2020   K 3.3 (L) 11/10/2020   CO2 23 11/10/2020   GLUCOSE 109 (H) 11/10/2020   BUN 7 11/10/2020   CREATININE 0.74 11/10/2020   BILITOT 0.6 07/10/2020   ALKPHOS 79 07/10/2020   AST 15 07/10/2020   ALT 15 07/10/2020   PROT 7.4 07/10/2020   ALBUMIN 4.0 07/10/2020   CALCIUM 9.4 11/10/2020   ANIONGAP 7 11/10/2020   Lab Results  Component Value Date   CHOL 181 12/23/2013   Lab Results  Component Value Date   HDL 75 12/23/2013   Lab Results  Component Value Date   LDLCALC 85 12/23/2013   Lab Results  Component Value Date   TRIG 103 12/23/2013   Lab Results  Component Value Date   CHOLHDL 2.4 12/23/2013   Lab Results  Component Value Date   HGBA1C 5.4 03/18/2018   2021  Lumbar MRI IMPRESSION: 1. Postoperative changes at L5-S1 without evidence for residual or recurrent disc protrusion or stenosis. 2. Enhancement of the right S1 nerve root may be related to inflammation. 3. No other significant disc disease or stenosis.  2022 Cervical spine CT IMPRESSION: 1. No CT evidence for acute intracranial abnormality. 2. Straightening of the cervical spine with postsurgical changes at C5 through C7. No acute osseous abnormality.  2022 CT HEAD Negative     Assessment & Plan:   Problem List Items Addressed This Visit       Cardiovascular and Mediastinum   Migraine without status migrainosus, not intractable (Chronic)    Would benefit from an MRI of the brain but does not have insurance at this time we will hold off on this      Relevant Medications   amitriptyline (ELAVIL) 10 MG tablet   citalopram (CELEXA) 20 MG tablet   diclofenac (CATAFLAM) 50 MG tablet   methocarbamol (ROBAXIN) 500 MG tablet     Digestive   Gastroesophageal reflux disease without esophagitis   Relevant Medications   metoCLOPramide (REGLAN) 10 MG tablet    omeprazole (PRILOSEC) 40 MG capsule     Nervous and Auditory   Radiculopathy due to lumbar intervertebral disc disorder    Recurrent low back pain we will administer Toradol x1 injection 60 mg  We will refill diclofenac      Relevant Medications   amitriptyline (ELAVIL) 10 MG tablet   citalopram (CELEXA) 20 MG tablet   methocarbamol (ROBAXIN) 500 MG tablet   Chronic low back pain with sciatica   Relevant Medications   amitriptyline (ELAVIL) 10 MG tablet   citalopram (CELEXA) 20 MG tablet   diclofenac (CATAFLAM) 50 MG tablet   methocarbamol (ROBAXIN) 500 MG tablet     Other   Fibromyalgia (Chronic)   Relevant Medications   amitriptyline (ELAVIL) 10 MG tablet   citalopram (CELEXA) 20 MG tablet   diclofenac (CATAFLAM) 50 MG tablet   methocarbamol (ROBAXIN) 500 MG tablet   Current smoker (Chronic)       Current smoking consumption amount: 1/2 pack a day  Dicsussion on advise to quit smoking and smoking impacts: Cardiovascular impacts Patient's willingness to quit: Not yet willing to quit  Methods to quit smoking discussed: Behavioral modification  Medication management of smoking session drugs discussed: None recommended  Resources provided:  AVS   Setting quit date not established  Follow-up arranged 2 months   Time spent counseling the patient: 5 minutes       Cervical spinal stenosis   Relevant Medications   amitriptyline (ELAVIL) 10 MG tablet   Other Visit Diagnoses     Colon cancer screening    -  Primary   Relevant Orders   Fecal occult blood, imunochemical   NSAID long-term use       Relevant Medications   omeprazole (PRILOSEC) 40 MG capsule   Vitamin D deficiency disease       Relevant Medications   Vitamin D, Ergocalciferol, (DRISDOL) 1.25 MG (50000 UNIT) CAPS capsule   Need for immunization against influenza       Relevant Orders   Flu Vaccine QUAD 77moIM (Fluarix, Fluzone & Alfiuria Quad PF) (Completed)       Meds ordered this encounter   Medications   ketorolac (TORADOL) injection 60 mg   amitriptyline (ELAVIL) 10 MG tablet    Sig: Take 2 tablets (20 mg total) by mouth at bedtime.    Dispense:  60 tablet  Refill:  1    Dose change   citalopram (CELEXA) 20 MG tablet    Sig: Take 1 tablet (20 mg total) by mouth daily.    Dispense:  30 tablet    Refill:  3   diclofenac (CATAFLAM) 50 MG tablet    Sig: Take 1 tablet (50 mg total) by mouth 3 (three) times daily.    Dispense:  90 tablet    Refill:  2   fluconazole (DIFLUCAN) 100 MG tablet    Sig: Take two once then one daily until gone    Dispense:  7 tablet    Refill:  0   methocarbamol (ROBAXIN) 500 MG tablet    Sig: Take 1 tablet (500 mg total) by mouth every 8 (eight) hours as needed for muscle spasms.    Dispense:  90 tablet    Refill:  3   metoCLOPramide (REGLAN) 10 MG tablet    Sig: Take 1 tablet (10 mg total) by mouth every 6 (six) hours as needed for nausea (nausea/headache).    Dispense:  30 tablet    Refill:  1   omeprazole (PRILOSEC) 40 MG capsule    Sig: Take 1 capsule (40 mg total) by mouth daily.    Dispense:  30 capsule    Refill:  3   Vitamin D, Ergocalciferol, (DRISDOL) 1.25 MG (50000 UNIT) CAPS capsule    Sig: Take 1 capsule (50,000 Units total) by mouth every 7 (seven) days.    Dispense:  12 capsule    Refill:  1  Patient given a lifestyle medicine handout to review to try to improve diet to increase fiber in the diet Obtain colon cancer screening with fecal occult Follow-up: Return in about 3 months (around 12/19/2021).    Asencion Noble, MD

## 2021-09-18 ENCOUNTER — Encounter: Payer: Self-pay | Admitting: Critical Care Medicine

## 2021-09-18 ENCOUNTER — Other Ambulatory Visit: Payer: Self-pay

## 2021-09-18 ENCOUNTER — Ambulatory Visit: Payer: Self-pay | Attending: Critical Care Medicine | Admitting: Critical Care Medicine

## 2021-09-18 VITALS — BP 113/77 | HR 80 | Wt 196.4 lb

## 2021-09-18 DIAGNOSIS — M4802 Spinal stenosis, cervical region: Secondary | ICD-10-CM

## 2021-09-18 DIAGNOSIS — M5440 Lumbago with sciatica, unspecified side: Secondary | ICD-10-CM

## 2021-09-18 DIAGNOSIS — E559 Vitamin D deficiency, unspecified: Secondary | ICD-10-CM

## 2021-09-18 DIAGNOSIS — K219 Gastro-esophageal reflux disease without esophagitis: Secondary | ICD-10-CM

## 2021-09-18 DIAGNOSIS — Z791 Long term (current) use of non-steroidal anti-inflammatories (NSAID): Secondary | ICD-10-CM

## 2021-09-18 DIAGNOSIS — M5116 Intervertebral disc disorders with radiculopathy, lumbar region: Secondary | ICD-10-CM

## 2021-09-18 DIAGNOSIS — G8929 Other chronic pain: Secondary | ICD-10-CM

## 2021-09-18 DIAGNOSIS — Z23 Encounter for immunization: Secondary | ICD-10-CM

## 2021-09-18 DIAGNOSIS — Z1211 Encounter for screening for malignant neoplasm of colon: Secondary | ICD-10-CM

## 2021-09-18 DIAGNOSIS — G43909 Migraine, unspecified, not intractable, without status migrainosus: Secondary | ICD-10-CM

## 2021-09-18 DIAGNOSIS — F17200 Nicotine dependence, unspecified, uncomplicated: Secondary | ICD-10-CM

## 2021-09-18 DIAGNOSIS — M797 Fibromyalgia: Secondary | ICD-10-CM

## 2021-09-18 MED ORDER — METOCLOPRAMIDE HCL 10 MG PO TABS
10.0000 mg | ORAL_TABLET | Freq: Four times a day (QID) | ORAL | 1 refills | Status: DC | PRN
  Filled 2021-09-18: qty 30, 8d supply, fill #0

## 2021-09-18 MED ORDER — AMITRIPTYLINE HCL 10 MG PO TABS
20.0000 mg | ORAL_TABLET | Freq: Every day | ORAL | 1 refills | Status: DC
  Filled 2021-09-18: qty 60, 30d supply, fill #0

## 2021-09-18 MED ORDER — VITAMIN D (ERGOCALCIFEROL) 1.25 MG (50000 UNIT) PO CAPS
50000.0000 [IU] | ORAL_CAPSULE | ORAL | 1 refills | Status: DC
  Filled 2021-09-18: qty 12, 84d supply, fill #0

## 2021-09-18 MED ORDER — CITALOPRAM HYDROBROMIDE 20 MG PO TABS
20.0000 mg | ORAL_TABLET | Freq: Every day | ORAL | 3 refills | Status: DC
  Filled 2021-09-18: qty 30, 30d supply, fill #0

## 2021-09-18 MED ORDER — FLUCONAZOLE 100 MG PO TABS
ORAL_TABLET | ORAL | 0 refills | Status: DC
  Filled 2021-09-18: qty 7, 6d supply, fill #0

## 2021-09-18 MED ORDER — KETOROLAC TROMETHAMINE 60 MG/2ML IM SOLN
60.0000 mg | Freq: Once | INTRAMUSCULAR | Status: AC
  Administered 2021-09-18: 60 mg via INTRAMUSCULAR

## 2021-09-18 MED ORDER — OMEPRAZOLE 40 MG PO CPDR
40.0000 mg | DELAYED_RELEASE_CAPSULE | Freq: Every day | ORAL | 3 refills | Status: DC
  Filled 2021-09-18: qty 30, 30d supply, fill #0

## 2021-09-18 MED ORDER — DICLOFENAC POTASSIUM 50 MG PO TABS
50.0000 mg | ORAL_TABLET | Freq: Three times a day (TID) | ORAL | 2 refills | Status: DC
  Filled 2021-09-18: qty 90, 30d supply, fill #0

## 2021-09-18 MED ORDER — METHOCARBAMOL 500 MG PO TABS
500.0000 mg | ORAL_TABLET | Freq: Three times a day (TID) | ORAL | 3 refills | Status: DC | PRN
  Filled 2021-09-18: qty 90, 30d supply, fill #0

## 2021-09-18 NOTE — Patient Instructions (Addendum)
A toradol injection was given ? ?Refill on all medications was given ? ?Focus on reducing tobacco intake ? ?Follow healthy lifestyle medicine as per handout given ? ?Pick up colon cancer screen kit ? ?A Flu vaccine was given ? ?Return Dr Joya Gaskins 3 months ? ?Call if you get insurance then will obtain MRI of brain ?

## 2021-09-18 NOTE — Assessment & Plan Note (Signed)
Would benefit from an MRI of the brain but does not have insurance at this time we will hold off on this ?

## 2021-09-18 NOTE — Assessment & Plan Note (Signed)
? ? ??   Current smoking consumption amount: 1/2 pack a day  . Dicsussion on advise to quit smoking and smoking impacts: Cardiovascular impacts . Patient's willingness to quit: Not yet willing to quit  . Methods to quit smoking discussed: Behavioral modification  . Medication management of smoking session drugs discussed: None recommended  . Resources provided:  AVS   . Setting quit date not established  . Follow-up arranged 2 months   Time spent counseling the patient: 5 minutes  

## 2021-09-18 NOTE — Assessment & Plan Note (Signed)
Recurrent low back pain we will administer Toradol x1 injection 60 mg ? ?We will refill diclofenac ?

## 2021-09-19 ENCOUNTER — Other Ambulatory Visit: Payer: Self-pay

## 2021-09-26 ENCOUNTER — Other Ambulatory Visit: Payer: Self-pay

## 2021-09-26 ENCOUNTER — Telehealth: Payer: Self-pay

## 2021-09-26 LAB — FECAL OCCULT BLOOD, IMMUNOCHEMICAL: Fecal Occult Bld: NEGATIVE

## 2021-09-26 NOTE — Telephone Encounter (Signed)
Pt was called and vm was left, Information has been sent to nurse pool.   

## 2021-09-26 NOTE — Telephone Encounter (Signed)
-----   Message from Elsie Stain, MD sent at 09/26/2021  5:54 AM EDT ----- ?Let ms Savoia know fecal occult was NEG  no colon cancer rechk in one year ?

## 2021-11-19 ENCOUNTER — Telehealth: Payer: Self-pay | Admitting: Critical Care Medicine

## 2021-11-19 NOTE — Telephone Encounter (Signed)
Copied from [place]. Topic: General - Inquiry ?>> Nov 19, 2021 10:24 AM [place] wrote: ?Reason for CRM: PT wanting to let Dr Joya Gaskins know that she is getting the financial paperwork that was supposed to be sent in faxed over today PT cb # [phone] ?

## 2021-11-21 NOTE — Telephone Encounter (Signed)
I called pt and stated that she sent over financial paperwork for you to sign. ?

## 2021-11-26 NOTE — Progress Notes (Signed)
? ?Established Patient Office Visit ? ?Subjective:  ?Patient ID: Caitlin Valdez, female    DOB: 15-Jan-1971  Age: 51 y.o. MRN: [id number] ? ?CC:  ?Chief Complaint  ?Patient presents with  ? Generalized Body Aches  ? ? ?HPI ? ?01/03/21 ?Caitlin Valdez presents for follow up regarding diffuse bodily pain. The patient has a history of cervical spinal fusion, cervical spinal stenosis, sciatic nerve pain, radiculopathy due to lumbar intervertebral disc disorder, and fibromyalgia. The patient has been experiencing chronic, 10/10 pain in all areas of her body. The pain is worse when moving, changing positions, and touching any of the areas. The pain is so severe she is unable to sleep at all. The patient takes goodie powder and Aleve to treat the pain which is somewhat helpful. Taking Robaxin three times daily has not been helpful. The patient saw a pain management clinic in the past and was prescribed Percocet. However, the patient is now unable to afford the copay for her visits to the pain clinic. The patient recently was placed on a leave of absence from her job until September. She would like to apply for disability.  ?  ?The patient has been experiencing significant anxiety and depression due to her financial struggles and pain. She would like to see a mental health specialist to manage her symptoms. The patient states she took a medication for her mental health about 6 years ago but does not remember what this medication was.  ?02/01/2021 ?This patient is seen in return follow-up by way of a phone visit.  Due to lack of finances she is yet to get an appointment with chronic pain management clinic.  Patient has paperwork from her long-term disability insurance company she needs to have filled out.  I went over with her all of her current symptoms.  She is not able to reach grab her grass because of hand pain and arm pain.  She cannot sit for long periods of time she cannot lift more than 5 pounds for only brief  periods ? ?Patient has a history of fibromyalgia previously diagnosed she also has cervical spine stenosis and lumbar disc disease.  She has had lumbar microdiscectomy and cervical spine fusion and discectomies.  This was all done by Dr. Rodell Perna of orthospine.  Despite this she still has chronic pain.  We did a complete rheumatologic survey and was negative.  Patient is concerned about the possibility of multiple sclerosis although her symptoms are very atypical for this.  She is requesting a neurology second opinion. ? ? ?09/18/2021 ? ?Caitlin Valdez presents for primary care follow-up.  Patient continues to have diffuse pain in the back neck and lower lumbar areas.  The patient also complains of frequent vaginal yeast infections as well and would like a refill on her fluconazole.  She remains constipated at times.  She is not eating much in the way of high fiber in the diet.  She is requesting potential MRI but would like to wait till she can assure herself that she has insurance which she is applying for now.  Patient does need a colon cancer screening she agrees to repeat this.  When she was seen in October she received a Toradol injection which helped.  She would like another injection and does not agree to receive a flu vaccine. ?The patient no longer is working she is trying to apply for disability.  She does have heartburn at times.  We try to refer her to neurology  in July but they wanted an MRI of the brain done first.  The patient was concerned about multiple sclerosis. ? ?5/16 ?The patient presents today and is now living in [place] with her mother.  She lost her job.  She needed paperwork to see if he can get into a house that has no steps.  On arrival blood pressure 127/84.  She has had vaginal pain with whitish discharge for 2 weeks.  She fell at 1 point.  She still has chronic pain lower back and legs and arms.  She was going to the [facility] pain clinic but does not have funding for  this now.  She states the diclofenac we gave her last time was helpful but the Toradol injections were not.  She would like her vitamin D rechecked.  There are no other complaints at this time. ?Note patient did have fecal occult kit performed and it was negative for colon cancer ? ?Past Medical History:  ?Diagnosis Date  ? Abdominal pain, right lower quadrant   ? Anxiety   ? Arthritis   ? Depression   ? Fibromyalgia   ? GERD (gastroesophageal reflux disease)   ? Migraine   ? Obesity   ? Peripheral vascular disease ([place])   ? RIGHT LEG  ? PONV (postoperative nausea and vomiting)   ? PTSD (post-traumatic stress disorder)   ? Stroke [facility])   ? many years ago - no residual  ? Trichomonas 2012  ? Varicose veins of leg with swelling, right   ? "sometimes swelling."  ? Vitamin D deficiency   ? ? ?Past Surgical History:  ?Procedure Laterality Date  ? ANTERIOR CERVICAL DECOMP/DISCECTOMY FUSION N/A 02/16/2020  ? Procedure: CERVICAL FIVE THROUGH CERVICAL SIX, CERVICAL SIX  THROUGH CERVICAL SEVEN ANTERIOR CERVICAL DECOMPRESSION/DISCECTOMY FUSION, ALLOGRAFT, PLATE;  Surgeon: Marybelle Killings, MD;  Location: [facility];  Service: Orthopedics;  Laterality: N/A;  ? CESAREAN SECTION    ? FRACTURE SURGERY    ? HYSTEROSCOPY WITH NOVASURE N/A 08/08/2014  ? Procedure:  NOVASURE;  Surgeon: Emily Filbert, MD;  Location: [facility];  Service: Gynecology;  Laterality: N/A;  ? LUMBAR LAMINECTOMY/DECOMPRESSION MICRODISCECTOMY N/A 09/27/2019  ? Procedure: RIGHT LUMBAR FIVE TO SACRAL ONE MICRODISCECTOMY;  Surgeon: Marybelle Killings, MD;  Location: [facility];  Service: Orthopedics;  Laterality: N/A;  ? VEIN SURGERY    ? ? ?Family History  ?Problem Relation Age of Onset  ? Diabetes Mother   ? Hypertension Mother   ? Breast cancer Mother 30  ? Diabetes Father   ? Hypertension Father   ? Diabetes Other   ? Hypertension Other   ? Other Neg Hx   ? ? ?Social History  ? ?Socioeconomic History  ? Marital status: Single  ?  Spouse name: Not on file  ? Number of children: Not on  file  ? Years of education: Not on file  ? Highest education level: Not on file  ?Occupational History  ? Not on file  ?Tobacco Use  ? Smoking status: Every Day  ?  Packs/day: 1.00  ?  Years: 5.00  ?  Pack years: 5.00  ?  Types: Cigarettes  ? Smokeless tobacco: Never  ? Tobacco comments:  ?  trying to quit  ?Vaping Use  ? Vaping Use: Never used  ?Substance and Sexual Activity  ? Alcohol use: No  ? Drug use: Yes  ?  Types: Marijuana  ?  Comment: ocassional use for pain - 02/12/20  ? Sexual  activity: Yes  ?  Birth control/protection: Condom  ?Other Topics Concern  ? Not on file  ?Social History Narrative  ? Not on file  ? ?Social Determinants of Health  ? ?Financial Resource Strain: Not on file  ?Food Insecurity: Not on file  ?Transportation Needs: Not on file  ?Physical Activity: Not on file  ?Stress: Not on file  ?Social Connections: Not on file  ?Intimate Partner Violence: Not on file  ? ? ?Outpatient Medications Prior to Visit  ?Medication Sig Dispense Refill  ? Multiple Vitamin (MULTIVITAMIN WITH MINERALS) TABS tablet Take 1 tablet by mouth daily.    ? amitriptyline (ELAVIL) 10 MG tablet Take 2 tablets (20 mg total) by mouth at bedtime. 60 tablet 1  ? citalopram (CELEXA) 20 MG tablet Take 1 tablet (20 mg total) by mouth daily. 30 tablet 3  ? diclofenac (CATAFLAM) 50 MG tablet Take 1 tablet (50 mg total) by mouth 3 (three) times daily. 90 tablet 2  ? fluconazole (DIFLUCAN) 100 MG tablet Take two once then one daily until gone 7 tablet 0  ? methocarbamol (ROBAXIN) 500 MG tablet Take 1 tablet (500 mg total) by mouth every 8 (eight) hours as needed for muscle spasms. 90 tablet 3  ? metoCLOPramide (REGLAN) 10 MG tablet Take 1 tablet (10 mg total) by mouth every 6 (six) hours as needed for nausea (nausea/headache). 30 tablet 1  ? omeprazole (PRILOSEC) 40 MG capsule Take 1 capsule (40 mg total) by mouth daily. 30 capsule 3  ? Vitamin D, Ergocalciferol, (DRISDOL) 1.25 MG (50000 UNIT) CAPS capsule Take 1 capsule (50,000  Units total) by mouth every 7 (seven) days. 12 capsule 1  ? ?No facility-administered medications prior to visit.  ? ? ?Allergies  ?Allergen Reactions  ? Latex Hives  ? Lyrica [Pregabalin] Swelling and Other (See Com

## 2021-11-27 ENCOUNTER — Other Ambulatory Visit (HOSPITAL_COMMUNITY)
Admission: RE | Admit: 2021-11-27 | Discharge: 2021-11-27 | Disposition: A | Discharge status: Home / Self Care | Payer: Self-pay | Source: Ambulatory Visit | Attending: Critical Care Medicine | Admitting: Critical Care Medicine

## 2021-11-27 ENCOUNTER — Encounter: Payer: Self-pay | Admitting: Critical Care Medicine

## 2021-11-27 ENCOUNTER — Ambulatory Visit: Payer: Self-pay | Attending: Critical Care Medicine | Admitting: Critical Care Medicine

## 2021-11-27 VITALS — BP 127/84 | HR 87 | Wt 189.8 lb

## 2021-11-27 DIAGNOSIS — M5116 Intervertebral disc disorders with radiculopathy, lumbar region: Secondary | ICD-10-CM

## 2021-11-27 DIAGNOSIS — E559 Vitamin D deficiency, unspecified: Secondary | ICD-10-CM

## 2021-11-27 DIAGNOSIS — Z791 Long term (current) use of non-steroidal anti-inflammatories (NSAID): Secondary | ICD-10-CM

## 2021-11-27 DIAGNOSIS — G8929 Other chronic pain: Secondary | ICD-10-CM

## 2021-11-27 DIAGNOSIS — N898 Other specified noninflammatory disorders of vagina: Secondary | ICD-10-CM

## 2021-11-27 DIAGNOSIS — R300 Dysuria: Secondary | ICD-10-CM | POA: Insufficient documentation

## 2021-11-27 DIAGNOSIS — F17200 Nicotine dependence, unspecified, uncomplicated: Secondary | ICD-10-CM

## 2021-11-27 DIAGNOSIS — M797 Fibromyalgia: Secondary | ICD-10-CM

## 2021-11-27 DIAGNOSIS — Z13220 Encounter for screening for lipoid disorders: Secondary | ICD-10-CM

## 2021-11-27 DIAGNOSIS — M4802 Spinal stenosis, cervical region: Secondary | ICD-10-CM

## 2021-11-27 DIAGNOSIS — M5440 Lumbago with sciatica, unspecified side: Secondary | ICD-10-CM

## 2021-11-27 DIAGNOSIS — K219 Gastro-esophageal reflux disease without esophagitis: Secondary | ICD-10-CM

## 2021-11-27 MED ORDER — AMITRIPTYLINE HCL 10 MG PO TABS: 20.0000 mg | ORAL_TABLET | Freq: Every day | ORAL | 1 refills | Status: DC

## 2021-11-27 MED ORDER — DICLOFENAC POTASSIUM 50 MG PO TABS: 50.0000 mg | ORAL_TABLET | Freq: Three times a day (TID) | ORAL | 2 refills | Status: DC

## 2021-11-27 MED ORDER — OMEPRAZOLE 40 MG PO CPDR: 40.0000 mg | DELAYED_RELEASE_CAPSULE | Freq: Every day | ORAL | 3 refills | Status: DC

## 2021-11-27 MED ORDER — METHOCARBAMOL 500 MG PO TABS: 500.0000 mg | ORAL_TABLET | Freq: Three times a day (TID) | ORAL | 3 refills | Status: DC | PRN

## 2021-11-27 MED ORDER — METOCLOPRAMIDE HCL 10 MG PO TABS: 10.0000 mg | ORAL_TABLET | Freq: Four times a day (QID) | ORAL | 1 refills | Status: AC | PRN

## 2021-11-27 MED ORDER — CITALOPRAM HYDROBROMIDE 20 MG PO TABS: 20.0000 mg | ORAL_TABLET | Freq: Every day | ORAL | 3 refills | Status: DC

## 2021-11-27 MED ORDER — VITAMIN D (ERGOCALCIFEROL) 1.25 MG (50000 UNIT) PO CAPS: 50000.0000 [IU] | ORAL_CAPSULE | ORAL | 1 refills | Status: DC

## 2021-11-27 NOTE — Assessment & Plan Note (Signed)
Recurrent vaginal discharge will check cervical vaginal swab urinalysis and urine culture ?

## 2021-11-27 NOTE — Assessment & Plan Note (Signed)
? ? ??   Current smoking consumption amount: 1/2 pack a day  . Dicsussion on advise to quit smoking and smoking impacts: Cardiovascular impacts . Patient's willingness to quit: Not yet willing to quit  . Methods to quit smoking discussed: Behavioral modification  . Medication management of smoking session drugs discussed: None recommended  . Resources provided:  AVS   . Setting quit date not established  . Follow-up arranged 2 months   Time spent counseling the patient: 5 minutes  

## 2021-11-27 NOTE — Assessment & Plan Note (Signed)
Plan to renew methocarbamol and diclofenac ? ?Gabapentin was of no benefit this has been discontinued ?

## 2021-11-27 NOTE — Assessment & Plan Note (Signed)
Recommended for the patient to be seen again by pain management ?

## 2021-11-27 NOTE — Patient Instructions (Addendum)
Refills on all your medications sent to [facility] in [place] ? ?You may need to go back to the pain clinic as we suggested ? ?Consider gabapentin just try the diclofenac 1 pill daily for pain in the back ? ?Complete screening labs obtained today along with urine studies and vaginal swab we will call you results ? ?We will ensure your paperwork is sent to [address] in [place] ? ?We will give you originals of this paperwork ? ?Next visit with Dr. Joya Gaskins in 4 months will be a video virtual visit ?

## 2021-11-28 LAB — CBC WITH DIFFERENTIAL/PLATELET
Basophils Absolute: 0 10*3/uL (ref 0.0–0.2)
Basos: 0 %
EOS (ABSOLUTE): 0.1 10*3/uL (ref 0.0–0.4)
Eos: 1 %
Hematocrit: 40 % (ref 34.0–46.6)
Hemoglobin: 14.3 g/dL (ref 11.1–15.9)
Immature Grans (Abs): 0 10*3/uL (ref 0.0–0.1)
Immature Granulocytes: 0 %
Lymphocytes Absolute: 3.3 10*3/uL — ABNORMAL HIGH (ref 0.7–3.1)
Lymphs: 48 %
MCH: 32.9 pg (ref 26.6–33.0)
MCHC: 35.8 g/dL — ABNORMAL HIGH (ref 31.5–35.7)
MCV: 92 fL (ref 79–97)
Monocytes Absolute: 0.6 10*3/uL (ref 0.1–0.9)
Monocytes: 9 %
Neutrophils Absolute: 2.9 10*3/uL (ref 1.4–7.0)
Neutrophils: 42 %
Platelets: 249 10*3/uL (ref 150–450)
RBC: 4.34 x10E6/uL (ref 3.77–5.28)
RDW: 11.9 % (ref 11.7–15.4)
WBC: 7 10*3/uL (ref 3.4–10.8)

## 2021-11-28 LAB — CERVICOVAGINAL ANCILLARY ONLY
Bacterial Vaginitis (gardnerella): POSITIVE — AB
Candida Glabrata: NEGATIVE
Candida Vaginitis: NEGATIVE
Chlamydia: NEGATIVE
Comment: NEGATIVE
Comment: NEGATIVE
Comment: NEGATIVE
Comment: NEGATIVE
Comment: NEGATIVE
Comment: NORMAL
Neisseria Gonorrhea: NEGATIVE
Trichomonas: NEGATIVE

## 2021-11-28 LAB — COMPREHENSIVE METABOLIC PANEL
ALT: 12 IU/L (ref 0–32)
AST: 11 IU/L (ref 0–40)
Albumin/Globulin Ratio: 1.7 (ref 1.2–2.2)
Albumin: 4.5 g/dL (ref 3.8–4.8)
Alkaline Phosphatase: 89 IU/L (ref 44–121)
BUN/Creatinine Ratio: 8 — ABNORMAL LOW (ref 9–23)
BUN: 7 mg/dL (ref 6–24)
Bilirubin Total: 0.3 mg/dL (ref 0.0–1.2)
CO2: 21 mmol/L (ref 20–29)
Calcium: 9.5 mg/dL (ref 8.7–10.2)
Chloride: 106 mmol/L (ref 96–106)
Creatinine, Ser: 0.83 mg/dL (ref 0.57–1.00)
Globulin, Total: 2.6 g/dL (ref 1.5–4.5)
Glucose: 87 mg/dL (ref 70–99)
Potassium: 4.6 mmol/L (ref 3.5–5.2)
Sodium: 140 mmol/L (ref 134–144)
Total Protein: 7.1 g/dL (ref 6.0–8.5)
eGFR: 86 mL/min/{1.73_m2} (ref >59)

## 2021-11-28 LAB — LIPID PANEL
Chol/HDL Ratio: 2.6 ratio (ref 0.0–4.4)
Cholesterol, Total: 201 mg/dL — ABNORMAL HIGH (ref 100–199)
HDL: 77 mg/dL (ref >39)
LDL Chol Calc (NIH): 108 mg/dL — ABNORMAL HIGH (ref 0–99)
Triglycerides: 90 mg/dL (ref 0–149)
VLDL Cholesterol Cal: 16 mg/dL (ref 5–40)

## 2021-11-29 ENCOUNTER — Other Ambulatory Visit: Payer: Self-pay | Admitting: Critical Care Medicine

## 2021-11-29 ENCOUNTER — Telehealth: Payer: Self-pay

## 2021-11-29 LAB — URINE CULTURE: Organism ID, Bacteria: NO GROWTH

## 2021-11-29 MED ORDER — DOXYCYCLINE HYCLATE 100 MG PO TABS: 100.0000 mg | ORAL_TABLET | Freq: Two times a day (BID) | ORAL | 0 refills | Status: DC

## 2021-11-29 MED ORDER — METRONIDAZOLE 500 MG PO TABS
500.0000 mg | ORAL_TABLET | Freq: Three times a day (TID) | ORAL | 0 refills | Status: DC
Start: 2021-11-29 — End: 2021-11-29

## 2021-11-29 MED ORDER — ATORVASTATIN CALCIUM 10 MG PO TABS: 10.0000 mg | ORAL_TABLET | Freq: Every day | ORAL | 3 refills | Status: DC

## 2021-11-29 NOTE — Telephone Encounter (Signed)
-----   Message from Elsie Stain, MD sent at 11/29/2021  5:03 AM EDT ----- Let pt know she has bacteria in vagina, ABX sent to her pharmacy in [place], cholesterol is very high, cholesterol pill sent to pharmacy Kidney liver normal blood count normal

## 2021-11-29 NOTE — Telephone Encounter (Signed)
-----   Message from Elsie Stain, MD sent at 11/29/2021 11:37 AM EDT ----- Urine culture is not grwoth

## 2021-11-29 NOTE — Telephone Encounter (Signed)
Pt was called and is aware of results, DOB was confirmed.  ?

## 2021-11-29 NOTE — Telephone Encounter (Signed)
Then doxycycline sent  FYI flagyl is not on her allergy list ask her whate is her allergy

## 2021-11-29 NOTE — Addendum Note (Signed)
Addended by: Asencion Noble E on: 11/29/2021 04:30 PM   Modules accepted: Orders

## 2021-11-30 NOTE — Telephone Encounter (Signed)
Called and pt is aware, Pt said she would call back

## 2022-01-29 ENCOUNTER — Telehealth: Payer: Self-pay | Admitting: Emergency Medicine

## 2022-01-29 NOTE — Telephone Encounter (Signed)
Copied from [place]. Topic: General - Other >> Jan 29, 2022 10:53 AM [place] C wrote: Reason for CRM: The patient has called to request updated handicap placard letter from their PCP  The patient's current handicap placard will expire 02/11/22  Please contact the patient further if needed >> Jan 29, 2022 10:55 AM [place] C wrote: The patient would like to pick up their paperwork tomorrow 01/30/22

## 2022-01-29 NOTE — Telephone Encounter (Signed)
I will place paperwork in mail box and will call pt when done

## 2022-01-30 NOTE — Telephone Encounter (Signed)
Called pt, she is aware of completed paperwork

## 2022-02-04 NOTE — Progress Notes (Signed)
Established Patient Office Visit  Subjective:  Patient ID: Caitlin Valdez, female    DOB: 1971-03-30  Age: 51 y.o. MRN: [id number]  CC:  No chief complaint on file.   HPI  01/03/21 Caitlin Valdez presents for follow up regarding diffuse bodily pain. The patient has a history of cervical spinal fusion, cervical spinal stenosis, sciatic nerve pain, radiculopathy due to lumbar intervertebral disc disorder, and fibromyalgia. The patient has been experiencing chronic, 10/10 pain in all areas of her body. The pain is worse when moving, changing positions, and touching any of the areas. The pain is so severe she is unable to sleep at all. The patient takes goodie powder and Aleve to treat the pain which is somewhat helpful. Taking Robaxin three times daily has not been helpful. The patient saw a pain management clinic in the past and was prescribed Percocet. However, the patient is now unable to afford the copay for her visits to the pain clinic. The patient recently was placed on a leave of absence from her job until September. She would like to apply for disability.    The patient has been experiencing significant anxiety and depression due to her financial struggles and pain. She would like to see a mental health specialist to manage her symptoms. The patient states she took a medication for her mental health about 6 years ago but does not remember what this medication was.  02/01/2021 This patient is seen in return follow-up by way of a phone visit.  Due to lack of finances she is yet to get an appointment with chronic pain management clinic.  Patient has paperwork from her long-term disability insurance company she needs to have filled out.  I went over with her all of her current symptoms.  She is not able to reach grab her grass because of hand pain and arm pain.  She cannot sit for long periods of time she cannot lift more than 5 pounds for only brief periods  Patient has a history of fibromyalgia  previously diagnosed she also has cervical spine stenosis and lumbar disc disease.  She has had lumbar microdiscectomy and cervical spine fusion and discectomies.  This was all done by Dr. Rodell Perna of orthospine.  Despite this she still has chronic pain.  We did a complete rheumatologic survey and was negative.  Patient is concerned about the possibility of multiple sclerosis although her symptoms are very atypical for this.  She is requesting a neurology second opinion.   09/18/2021  Caitlin Valdez presents for primary care follow-up.  Patient continues to have diffuse pain in the back neck and lower lumbar areas.  The patient also complains of frequent vaginal yeast infections as well and would like a refill on her fluconazole.  She remains constipated at times.  She is not eating much in the way of high fiber in the diet.  She is requesting potential MRI but would like to wait till she can assure herself that she has insurance which she is applying for now.  Patient does need a colon cancer screening she agrees to repeat this.  When she was seen in October she received a Toradol injection which helped.  She would like another injection and does not agree to receive a flu vaccine. The patient no longer is working she is trying to apply for disability.  She does have heartburn at times.  We try to refer her to neurology in July but they wanted an MRI  of the brain done first.  The patient was concerned about multiple sclerosis.  5/16 The patient presents today and is now living in [place] with her mother.  She lost her job.  She needed paperwork to see if he can get into a house that has no steps.  On arrival blood pressure 127/84.  She has had vaginal pain with whitish discharge for 2 weeks.  She fell at 1 point.  She still has chronic pain lower back and legs and arms.  She was going to the [facility] pain clinic but does not have funding for this now.  She states the diclofenac we gave her  last time was helpful but the Toradol injections were not.  She would like her vitamin D rechecked.  There are no other complaints at this time. Note patient did have fecal occult kit performed and it was negative for colon cancer  Past Medical History:  Diagnosis Date  . Abdominal pain, right lower quadrant   . Anxiety   . Arthritis   . Depression   . Fibromyalgia   . GERD (gastroesophageal reflux disease)   . Migraine   . Obesity   . Peripheral vascular disease (HCC)    RIGHT LEG  . PONV (postoperative nausea and vomiting)   . PTSD (post-traumatic stress disorder)   . Stroke [facility])    many years ago - no residual  . Trichomonas 2012  . Varicose veins of leg with swelling, right    "sometimes swelling."  . Vitamin D deficiency     Past Surgical History:  Procedure Laterality Date  . ANTERIOR CERVICAL DECOMP/DISCECTOMY FUSION N/A 02/16/2020   Procedure: CERVICAL FIVE THROUGH CERVICAL SIX, CERVICAL SIX  THROUGH CERVICAL SEVEN ANTERIOR CERVICAL DECOMPRESSION/DISCECTOMY FUSION, ALLOGRAFT, PLATE;  Surgeon: Marybelle Killings, MD;  Location: [place];  Service: Orthopedics;  Laterality: N/A;  . CESAREAN SECTION    . FRACTURE SURGERY    . HYSTEROSCOPY WITH NOVASURE N/A 08/08/2014   Procedure:  NOVASURE;  Surgeon: Emily Filbert, MD;  Location: [facility];  Service: Gynecology;  Laterality: N/A;  . LUMBAR LAMINECTOMY/DECOMPRESSION MICRODISCECTOMY N/A 09/27/2019   Procedure: RIGHT LUMBAR FIVE TO SACRAL ONE MICRODISCECTOMY;  Surgeon: Marybelle Killings, MD;  Location: [place];  Service: Orthopedics;  Laterality: N/A;  . VEIN SURGERY      Family History  Problem Relation Age of Onset  . Diabetes Mother   . Hypertension Mother   . Breast cancer Mother 21  . Diabetes Father   . Hypertension Father   . Diabetes Other   . Hypertension Other   . Other Neg Hx     Social History   Socioeconomic History  . Marital status: Single    Spouse name: Not on file  . Number of children: Not on file  . Years of  education: Not on file  . Highest education level: Not on file  Occupational History  . Not on file  Tobacco Use  . Smoking status: Every Day    Packs/day: 1.00    Years: 5.00    Total pack years: 5.00    Types: Cigarettes  . Smokeless tobacco: Never  . Tobacco comments:    trying to quit  Vaping Use  . Vaping Use: Never used  Substance and Sexual Activity  . Alcohol use: No  . Drug use: Yes    Types: Marijuana    Comment: ocassional use for pain - 02/12/20  . Sexual activity: Yes    Birth  control/protection: Condom  Other Topics Concern  . Not on file  Social History Narrative  . Not on file   Social Determinants of Health   Financial Resource Strain: Not on file  Food Insecurity: Not on file  Transportation Needs: Not on file  Physical Activity: Not on file  Stress: Not on file  Social Connections: Not on file  Intimate Partner Violence: Not on file    Outpatient Medications Prior to Visit  Medication Sig Dispense Refill  . amitriptyline (ELAVIL) 10 MG tablet Take 2 tablets (20 mg total) by mouth at bedtime. 60 tablet 1  . atorvastatin (LIPITOR) 10 MG tablet Take 1 tablet (10 mg total) by mouth daily. 60 tablet 3  . citalopram (CELEXA) 20 MG tablet Take 1 tablet (20 mg total) by mouth daily. 30 tablet 3  . diclofenac (CATAFLAM) 50 MG tablet Take 1 tablet (50 mg total) by mouth 3 (three) times daily. 90 tablet 2  . doxycycline (VIBRA-TABS) 100 MG tablet Take 1 tablet (100 mg total) by mouth 2 (two) times daily. 14 tablet 0  . methocarbamol (ROBAXIN) 500 MG tablet Take 1 tablet (500 mg total) by mouth every 8 (eight) hours as needed for muscle spasms. 90 tablet 3  . metoCLOPramide (REGLAN) 10 MG tablet Take 1 tablet (10 mg total) by mouth every 6 (six) hours as needed for nausea (nausea/headache). 30 tablet 1  . Multiple Vitamin (MULTIVITAMIN WITH MINERALS) TABS tablet Take 1 tablet by mouth daily.    [name] omeprazole (PRILOSEC) 40 MG capsule Take 1 capsule (40 mg total) by  mouth daily. 30 capsule 3  . Vitamin D, Ergocalciferol, (DRISDOL) 1.25 MG (50000 UNIT) CAPS capsule Take 1 capsule (50,000 Units total) by mouth every 7 (seven) days. 12 capsule 1   No facility-administered medications prior to visit.    Allergies  Allergen Reactions  . Latex Hives  . Lyrica [Pregabalin] Swelling and Other (See Comments)    Her report: tongue swelling  . Tramadol Nausea Only    Feel sick    ROS Review of Systems  Constitutional: Negative.   HENT: Negative.  Negative for ear pain, postnasal drip, rhinorrhea, sinus pressure, sore throat, trouble swallowing and voice change.   Eyes: Negative.   Respiratory: Negative.  Negative for apnea, cough, choking, chest tightness, shortness of breath, wheezing and stridor.   Cardiovascular: Negative.  Negative for chest pain, palpitations and leg swelling.  Gastrointestinal:  Positive for abdominal distention, abdominal pain and constipation. Negative for nausea and vomiting.  Genitourinary: Negative.   Musculoskeletal:  Positive for arthralgias, back pain, gait problem, myalgias, neck pain and neck stiffness.  Skin: Negative.  Negative for rash.  Allergic/Immunologic: Negative.  Negative for environmental allergies and food allergies.  Neurological:  Negative for dizziness, syncope, weakness and headaches.  Hematological: Negative.  Negative for adenopathy. Does not bruise/bleed easily.  Psychiatric/Behavioral: Negative.  Negative for agitation, sleep disturbance and suicidal ideas. The patient is not nervous/anxious.       Objective:    Physical Exam Vitals reviewed.  Constitutional:      Appearance: Normal appearance. She is well-developed. She is obese. She is not diaphoretic.  HENT:     Head: Normocephalic and atraumatic.     Nose: No nasal deformity, septal deviation, mucosal edema or rhinorrhea.     Right Sinus: No maxillary sinus tenderness or frontal sinus tenderness.     Left Sinus: No maxillary sinus  tenderness or frontal sinus tenderness.     Mouth/Throat:  Mouth: Mucous membranes are moist.     Pharynx: Oropharynx is clear. No oropharyngeal exudate.  Eyes:     General: No scleral icterus.    Conjunctiva/sclera: Conjunctivae normal.     Pupils: Pupils are equal, round, and reactive to light.  Neck:     Thyroid: No thyromegaly.     Vascular: No carotid bruit or JVD.     Trachea: Trachea normal. No tracheal tenderness or tracheal deviation.  Cardiovascular:     Rate and Rhythm: Normal rate and regular rhythm.     Chest Wall: PMI is not displaced.     Pulses: Normal pulses. No decreased pulses.     Heart sounds: Normal heart sounds, S1 normal and S2 normal. Heart sounds not distant. No murmur heard.    No systolic murmur is present.     No diastolic murmur is present.     No friction rub. No gallop. No S3 or S4 sounds.  Pulmonary:     Effort: No tachypnea, accessory muscle usage or respiratory distress.     Breath sounds: No stridor. No decreased breath sounds, wheezing, rhonchi or rales.  Chest:     Chest wall: No tenderness.  Abdominal:     General: Bowel sounds are normal. There is no distension.     Palpations: Abdomen is soft. Abdomen is not rigid.     Tenderness: There is no abdominal tenderness. There is no guarding or rebound.  Musculoskeletal:        General: Tenderness present. Normal range of motion.     Cervical back: Normal range of motion and neck supple. No edema, erythema or rigidity. No muscular tenderness. Normal range of motion.     Comments: Posterior neck tenderness  Lymphadenopathy:     Head:     Right side of head: No submental or submandibular adenopathy.     Left side of head: No submental or submandibular adenopathy.     Cervical: No cervical adenopathy.  Skin:    General: Skin is warm and dry.     Coloration: Skin is not pale.     Findings: No rash.     Nails: There is no clubbing.  Neurological:     Mental Status: She is alert and oriented  to person, place, and time.     Sensory: No sensory deficit.  Psychiatric:        Mood and Affect: Mood normal.        Speech: Speech normal.        Behavior: Behavior normal.        Thought Content: Thought content normal.    There were no vitals taken for this visit. Wt Readings from Last 3 Encounters:  11/27/21 189 lb 12.8 oz (86.1 kg)  09/18/21 196 lb 6.4 oz (89.1 kg)  04/26/21 186 lb 12.8 oz (84.7 kg)     There are no preventive care reminders to display for this patient.   There are no preventive care reminders to display for this patient.  Lab Results  Component Value Date   TSH 0.780 04/29/2019   Lab Results  Component Value Date   WBC 7.0 11/27/2021   HGB 14.3 11/27/2021   HCT 40.0 11/27/2021   MCV 92 11/27/2021   PLT 249 11/27/2021   Lab Results  Component Value Date   NA 140 11/27/2021   K 4.6 11/27/2021   CO2 21 11/27/2021   GLUCOSE 87 11/27/2021   BUN 7 11/27/2021   CREATININE 0.83 11/27/2021  BILITOT 0.3 11/27/2021   ALKPHOS 89 11/27/2021   AST 11 11/27/2021   ALT 12 11/27/2021   PROT 7.1 11/27/2021   ALBUMIN 4.5 11/27/2021   CALCIUM 9.5 11/27/2021   ANIONGAP 7 11/10/2020   EGFR 86 11/27/2021   Lab Results  Component Value Date   CHOL 201 (H) 11/27/2021   Lab Results  Component Value Date   HDL 77 11/27/2021   Lab Results  Component Value Date   LDLCALC 108 (H) 11/27/2021   Lab Results  Component Value Date   TRIG 90 11/27/2021   Lab Results  Component Value Date   CHOLHDL 2.6 11/27/2021   Lab Results  Component Value Date   HGBA1C 5.4 03/18/2018   2021 Lumbar MRI IMPRESSION: 1. Postoperative changes at L5-S1 without evidence for residual or recurrent disc protrusion or stenosis. 2. Enhancement of the right S1 nerve root may be related to inflammation. 3. No other significant disc disease or stenosis.  2022 Cervical spine CT IMPRESSION: 1. No CT evidence for acute intracranial abnormality. 2. Straightening of the  cervical spine with postsurgical changes at C5 through C7. No acute osseous abnormality.  2022 CT HEAD Negative     Assessment & Plan:   Problem List Items Addressed This Visit   None No orders of the defined types were placed in this encounter. Follow-up: No follow-ups on file.    Asencion Noble, MD

## 2022-02-05 ENCOUNTER — Other Ambulatory Visit (HOSPITAL_COMMUNITY)
Admission: RE | Admit: 2022-02-05 | Discharge: 2022-02-05 | Disposition: A | Discharge status: Home / Self Care | Payer: Self-pay | Source: Ambulatory Visit | Attending: Critical Care Medicine | Admitting: Critical Care Medicine

## 2022-02-05 ENCOUNTER — Encounter: Payer: Self-pay | Admitting: Critical Care Medicine

## 2022-02-05 ENCOUNTER — Ambulatory Visit: Payer: Self-pay | Attending: Critical Care Medicine | Admitting: Critical Care Medicine

## 2022-02-05 VITALS — BP 134/89 | HR 86 | Ht 63.0 in | Wt 182.3 lb

## 2022-02-05 DIAGNOSIS — Z113 Encounter for screening for infections with a predominantly sexual mode of transmission: Secondary | ICD-10-CM | POA: Insufficient documentation

## 2022-02-05 DIAGNOSIS — N898 Other specified noninflammatory disorders of vagina: Secondary | ICD-10-CM

## 2022-02-05 DIAGNOSIS — M797 Fibromyalgia: Secondary | ICD-10-CM

## 2022-02-05 DIAGNOSIS — E559 Vitamin D deficiency, unspecified: Secondary | ICD-10-CM

## 2022-02-05 DIAGNOSIS — K219 Gastro-esophageal reflux disease without esophagitis: Secondary | ICD-10-CM

## 2022-02-05 DIAGNOSIS — M5442 Lumbago with sciatica, left side: Secondary | ICD-10-CM

## 2022-02-05 DIAGNOSIS — F419 Anxiety disorder, unspecified: Secondary | ICD-10-CM

## 2022-02-05 DIAGNOSIS — G8929 Other chronic pain: Secondary | ICD-10-CM

## 2022-02-05 DIAGNOSIS — M4802 Spinal stenosis, cervical region: Secondary | ICD-10-CM

## 2022-02-05 DIAGNOSIS — Z202 Contact with and (suspected) exposure to infections with a predominantly sexual mode of transmission: Secondary | ICD-10-CM

## 2022-02-05 DIAGNOSIS — G894 Chronic pain syndrome: Secondary | ICD-10-CM

## 2022-02-05 DIAGNOSIS — M5116 Intervertebral disc disorders with radiculopathy, lumbar region: Secondary | ICD-10-CM

## 2022-02-05 DIAGNOSIS — M5440 Lumbago with sciatica, unspecified side: Secondary | ICD-10-CM

## 2022-02-05 DIAGNOSIS — F32A Depression, unspecified: Secondary | ICD-10-CM

## 2022-02-05 DIAGNOSIS — F17200 Nicotine dependence, unspecified, uncomplicated: Secondary | ICD-10-CM

## 2022-02-05 DIAGNOSIS — Z791 Long term (current) use of non-steroidal anti-inflammatories (NSAID): Secondary | ICD-10-CM

## 2022-02-05 DIAGNOSIS — M5441 Lumbago with sciatica, right side: Secondary | ICD-10-CM

## 2022-02-05 MED ORDER — AMITRIPTYLINE HCL 10 MG PO TABS: 20.0000 mg | ORAL_TABLET | Freq: Every day | ORAL | 1 refills | Status: AC

## 2022-02-05 MED ORDER — METHOCARBAMOL 500 MG PO TABS: 500.0000 mg | ORAL_TABLET | Freq: Three times a day (TID) | ORAL | 3 refills | Status: AC | PRN

## 2022-02-05 MED ORDER — CITALOPRAM HYDROBROMIDE 20 MG PO TABS: 20.0000 mg | ORAL_TABLET | Freq: Every day | ORAL | 3 refills | Status: AC

## 2022-02-05 MED ORDER — VITAMIN D (ERGOCALCIFEROL) 1.25 MG (50000 UNIT) PO CAPS: 50000.0000 [IU] | ORAL_CAPSULE | ORAL | 1 refills | Status: DC

## 2022-02-05 MED ORDER — KETOROLAC TROMETHAMINE 60 MG/2ML IM SOLN
60.0000 mg | Freq: Once | INTRAMUSCULAR | Status: AC
  Administered 2022-02-05: 60 mg via INTRAMUSCULAR

## 2022-02-05 MED ORDER — DICLOFENAC POTASSIUM 50 MG PO TABS: 50.0000 mg | ORAL_TABLET | Freq: Three times a day (TID) | ORAL | 2 refills | Status: DC

## 2022-02-05 MED ORDER — ATORVASTATIN CALCIUM 10 MG PO TABS: 10.0000 mg | ORAL_TABLET | Freq: Every day | ORAL | 3 refills | Status: AC

## 2022-02-05 MED ORDER — DOXYCYCLINE HYCLATE 100 MG PO TABS: 100.0000 mg | ORAL_TABLET | Freq: Two times a day (BID) | ORAL | 0 refills | Status: AC

## 2022-02-05 MED ORDER — OMEPRAZOLE 40 MG PO CPDR: 40.0000 mg | DELAYED_RELEASE_CAPSULE | Freq: Every day | ORAL | 3 refills | Status: AC

## 2022-02-05 NOTE — Assessment & Plan Note (Addendum)
Referral to pain clinic issued  Patient given a Toradol injection 60 mg

## 2022-02-05 NOTE — Assessment & Plan Note (Signed)
Screen with HIV RPR and herpes antibodies

## 2022-02-05 NOTE — Assessment & Plan Note (Signed)
Patient to continue with vitamin D supplement

## 2022-02-05 NOTE — Patient Instructions (Addendum)
Toradol injection was given this visit  Letter for food stamp program was issued  Try to reduce your tobacco content as we have discussed see attachment  Doxycycline was sent to your [facility] for vaginal infection and we will follow-up with you on your STD screenings today  Return to Dr. Joya Gaskins 3 months  Refills on all your medications for future refills sent to your [facility] pharmacy in [place]  Referral to pain clinic was sent to our number

## 2022-02-05 NOTE — Assessment & Plan Note (Signed)
Patient with vaginal discharge is recurrent I offered her boric acid vaginal's suppository but she declined this  We will reassess cervical vaginal swab and do generalized STD screening with herpes antibiotic as RPR and HIV  We will see this patient back in return follow-up and prescribe empiric doxycycline

## 2022-02-05 NOTE — Assessment & Plan Note (Signed)
Continue with Celexa 

## 2022-02-05 NOTE — Assessment & Plan Note (Signed)
  .   Current smoking consumption amount: 1/2 pack a day  . Dicsussion on advise to quit smoking and smoking impacts: Cardiovascular impacts . Patient's willingness to quit: Not yet willing to quit  . Methods to quit smoking discussed: Behavioral modification  . Medication management of smoking session drugs discussed: None recommended  . Resources provided:  AVS   . Setting quit date not established  . Follow-up arranged 2 months   Time spent counseling the patient: 5 minutes

## 2022-02-05 NOTE — Assessment & Plan Note (Signed)
Severe lumbar radiculopathy will continue with oral diclofenac and Robaxin and will also refer patient to pain clinic in [facility]  Patient given a letter for the food stamp program indicating she cannot work due to her orthopedic conditions

## 2022-02-05 NOTE — Assessment & Plan Note (Signed)
Plan to continue with amitriptyline

## 2022-02-06 LAB — HIV ANTIBODY (ROUTINE TESTING W REFLEX): HIV Screen 4th Generation wRfx: NONREACTIVE

## 2022-02-06 LAB — CERVICOVAGINAL ANCILLARY ONLY
Bacterial Vaginitis (gardnerella): NEGATIVE
Candida Glabrata: NEGATIVE
Candida Vaginitis: NEGATIVE
Chlamydia: NEGATIVE
Comment: NEGATIVE
Comment: NEGATIVE
Comment: NEGATIVE
Comment: NEGATIVE
Comment: NEGATIVE
Comment: NORMAL
Neisseria Gonorrhea: NEGATIVE
Trichomonas: NEGATIVE

## 2022-02-06 LAB — HSV-2 IGG SUPPLEMENTAL TEST

## 2022-02-06 LAB — HSV(HERPES SIMPLEX VRS) I + II AB-IGG
HSV 1 Glycoprotein G Ab, IgG: 33.6 index — ABNORMAL HIGH (ref 0.00–0.90)
HSV 2 IgG, Type Spec: 2.14 index — ABNORMAL HIGH (ref 0.00–0.90)

## 2022-02-06 LAB — RPR: RPR Ser Ql: NONREACTIVE

## 2022-02-07 ENCOUNTER — Telehealth: Payer: Self-pay | Admitting: Critical Care Medicine

## 2022-02-07 ENCOUNTER — Ambulatory Visit: Payer: Self-pay | Admitting: *Deleted

## 2022-02-07 ENCOUNTER — Other Ambulatory Visit: Payer: Self-pay | Admitting: Critical Care Medicine

## 2022-02-07 MED ORDER — ONDANSETRON HCL 4 MG PO TABS: 4.0000 mg | ORAL_TABLET | Freq: Three times a day (TID) | ORAL | 0 refills | Status: AC | PRN

## 2022-02-07 MED ORDER — VALACYCLOVIR HCL 1 G PO TABS: 1000.0000 mg | ORAL_TABLET | Freq: Two times a day (BID) | ORAL | 0 refills | Status: AC

## 2022-02-07 MED ORDER — DICLOFENAC POTASSIUM 50 MG PO TABS
ORAL_TABLET | ORAL | 2 refills | Status: AC
Start: 2022-02-07 — End: ?

## 2022-02-07 NOTE — Telephone Encounter (Signed)
  Lauren from [facility] seeking clarity regarding diclofenac (CATAFLAM) 50 MG tablet directions.   [place], [place] - [address] [place] [address] Phone:  [phone]  Fax:  [phone]

## 2022-02-07 NOTE — Telephone Encounter (Signed)
Pt was given toradol yesterday in clinic she is having nausea and emesis so I want her TO HOLD DICLOFENAC for now and NOT refill

## 2022-02-07 NOTE — Telephone Encounter (Signed)
Medication is needing currently on hold and they are needing clarification please

## 2022-02-07 NOTE — Progress Notes (Signed)
Pt aware of results.   I sent valtrex to her pharmacy  she is having nausea and vomiting from toradol , told her to stay on PPI, stop diclofenac and I will send in zofran

## 2022-02-07 NOTE — Telephone Encounter (Signed)
Patient states she just talked to her provider Answer Assessment - Initial Assessment Questions 1. REASON FOR CALL or QUESTION: "What is your reason for calling today?" or "How can I best help you?" or "What question do you have that I can help answer?"     Patient called with symptoms from vaccine this week- call disconnected before transfer to nurse triage. Call to patient- she states she has just spoken with her provider.  Protocols used: Information Only Call - No Triage-A-AH

## 2022-02-08 NOTE — Telephone Encounter (Signed)
Ok noted  

## 2022-02-12 ENCOUNTER — Telehealth: Payer: Self-pay | Admitting: Emergency Medicine

## 2022-02-12 NOTE — Telephone Encounter (Signed)
Copied from [place] [id number]. Topic: Referral - Question >> Feb 12, 2022 12:10 PM Caitlin Valdez P wrote: Reason for CRM: Pt called saying she does not want to go to the pain clinic that Dr. Joya Gaskins referred her to.  She would like to go somewhere else.  She also has questions about Social Security admin wants to know about the status of her disability  CB@  [phone] >> Feb 12, 2022 12:18 PM Everette C wrote: The patient has called to share that the referral was correct and there's no need for further contact

## 2022-02-12 NOTE — Telephone Encounter (Signed)
Copied from [place]. Topic: Referral - Status >> Feb 12, 2022  3:44 PM Ja-Kwan M wrote: Reason for CRM: Pt stated she would prefer that the referral for pain clinic be sent to the location is [place] due to the reviews of the [place] location

## 2022-02-14 NOTE — Telephone Encounter (Signed)
Called patient and the referral is handled. Patient did state that [place] office had sent a fax yesterday, I did let her know as of now I have not seen it and once its received I will call patient

## 2022-02-20 ENCOUNTER — Ambulatory Visit: Payer: Self-pay | Admitting: Critical Care Medicine

## 2022-02-26 ENCOUNTER — Ambulatory Visit: Payer: Self-pay | Attending: Critical Care Medicine | Admitting: Critical Care Medicine

## 2022-02-26 ENCOUNTER — Encounter: Payer: Self-pay | Admitting: Critical Care Medicine

## 2022-02-26 VITALS — BP 134/86 | HR 64 | Wt 182.0 lb

## 2022-02-26 DIAGNOSIS — M5116 Intervertebral disc disorders with radiculopathy, lumbar region: Secondary | ICD-10-CM

## 2022-02-26 DIAGNOSIS — E559 Vitamin D deficiency, unspecified: Secondary | ICD-10-CM

## 2022-02-26 MED ORDER — GABAPENTIN 300 MG PO CAPS: 300.0000 mg | ORAL_CAPSULE | Freq: Three times a day (TID) | ORAL | 3 refills | Status: AC

## 2022-02-26 MED ORDER — VITAMIN D (ERGOCALCIFEROL) 1.25 MG (50000 UNIT) PO CAPS: 50000.0000 [IU] | ORAL_CAPSULE | ORAL | 1 refills | Status: AC

## 2022-02-26 NOTE — Patient Instructions (Signed)
Paperwork was completed  Refills on gabapentin and vitamin D were produced  Return to Dr. Joya Gaskins 4 months  Keep your pain management appointment as scheduled

## 2022-02-26 NOTE — Progress Notes (Signed)
Established Patient Office Visit  Subjective:  Patient ID: Caitlin Valdez, female    DOB: 09/24/1970  Age: 51 y.o. MRN: [id number]  CC:  Chief Complaint  Patient presents with   paperwork   Medication Refill    HPI  01/03/21 Caitlin Valdez presents for follow up regarding diffuse bodily pain. The patient has a history of cervical spinal fusion, cervical spinal stenosis, sciatic nerve pain, radiculopathy due to lumbar intervertebral disc disorder, and fibromyalgia. The patient has been experiencing chronic, 10/10 pain in all areas of her body. The pain is worse when moving, changing positions, and touching any of the areas. The pain is so severe she is unable to sleep at all. The patient takes goodie powder and Aleve to treat the pain which is somewhat helpful. Taking Robaxin three times daily has not been helpful. The patient saw a pain management clinic in the past and was prescribed Percocet. However, the patient is now unable to afford the copay for her visits to the pain clinic. The patient recently was placed on a leave of absence from her job until September. She would like to apply for disability.    The patient has been experiencing significant anxiety and depression due to her financial struggles and pain. She would like to see a mental health specialist to manage her symptoms. The patient states she took a medication for her mental health about 6 years ago but does not remember what this medication was.  02/01/2021 This patient is seen in return follow-up by way of a phone visit.  Due to lack of finances she is yet to get an appointment with chronic pain management clinic.  Patient has paperwork from her long-term disability insurance company she needs to have filled out.  I went over with her all of her current symptoms.  She is not able to reach grab her grass because of hand pain and arm pain.  She cannot sit for long periods of time she cannot lift more than 5 pounds for only brief  periods  Patient has a history of fibromyalgia previously diagnosed she also has cervical spine stenosis and lumbar disc disease.  She has had lumbar microdiscectomy and cervical spine fusion and discectomies.  This was all done by Dr. Rodell Perna of orthospine.  Despite this she still has chronic pain.  We did a complete rheumatologic survey and was negative.  Patient is concerned about the possibility of multiple sclerosis although her symptoms are very atypical for this.  She is requesting a neurology second opinion.   09/18/2021  Caitlin Valdez presents for primary care follow-up.  Patient continues to have diffuse pain in the back neck and lower lumbar areas.  The patient also complains of frequent vaginal yeast infections as well and would like a refill on her fluconazole.  She remains constipated at times.  She is not eating much in the way of high fiber in the diet.  She is requesting potential MRI but would like to wait till she can assure herself that she has insurance which she is applying for now.  Patient does need a colon cancer screening she agrees to repeat this.  When she was seen in October she received a Toradol injection which helped.  She would like another injection and does not agree to receive a flu vaccine. The patient no longer is working she is trying to apply for disability.  She does have heartburn at times.  We try to refer her  to neurology in July but they wanted an MRI of the brain done first.  The patient was concerned about multiple sclerosis.  5/16 The patient presents today and is now living in [place] with her mother.  She lost her job.  She needed paperwork to see if he can get into a house that has no steps.  On arrival blood pressure 127/84.  She has had vaginal pain with whitish discharge for 2 weeks.  She fell at 1 point.  She still has chronic pain lower back and legs and arms.  She was going to the [facility] pain clinic but does not have funding for  this now.  She states the diclofenac we gave her last time was helpful but the Toradol injections were not.  She would like her vitamin D rechecked.  There are no other complaints at this time. Note patient did have fecal occult kit performed and it was negative for colon cancer  7/25 Patient seen in return visit and she notes increased loss of balance worsening back pain with radiation down the back of both legs and weakness in the legs.  She has underlying fibromyalgia and now has spinal stenosis in the lumbar area cervical spine disease as well she has had surgery in both areas without significant improvement in her myelopathy.  Patient now is living in [place] with her mother and she is looking for a pain management clinic in [place].  She thinks she may be getting insurance off the insurance exchange very soon using [place].  She complains of vaginal discharge.  She states the doxycycline helped when she received that in May.  She started having recurring symptoms about 3 weeks ago.  Patient would like generalized STD screening as she is finishing with a relationship and would like to be screened.  She still smokes 1 pack of cigarettes every 2 to 3 days.  On arrival blood pressure is 134/89. 8/15 Patient seen for paperwork assessment.  She brings paperwork to try to preserve her automobile insurance.  She continues to have chronic pain.  There are no changes in her complaints compared to the last visit 2 weeks ago  Past Medical History:  Diagnosis Date   Abdominal pain, right lower quadrant    Anxiety    Arthritis    Depression    Fibromyalgia    GERD (gastroesophageal reflux disease)    Migraine    Obesity    Peripheral vascular disease (HCC)    RIGHT LEG   PONV (postoperative nausea and vomiting)    PTSD (post-traumatic stress disorder)    Stroke ([place])    many years ago - no residual   Trichomonas 2012   Varicose veins of leg with swelling, right    "sometimes swelling."    Vitamin D deficiency     Past Surgical History:  Procedure Laterality Date   ANTERIOR CERVICAL DECOMP/DISCECTOMY FUSION N/A 02/16/2020   Procedure: CERVICAL FIVE THROUGH CERVICAL SIX, CERVICAL SIX  THROUGH CERVICAL SEVEN ANTERIOR CERVICAL DECOMPRESSION/DISCECTOMY FUSION, ALLOGRAFT, PLATE;  Surgeon: Marybelle Killings, MD;  Location: [facility];  Service: Orthopedics;  Laterality: N/A;   CESAREAN SECTION     FRACTURE SURGERY     HYSTEROSCOPY WITH NOVASURE N/A 08/08/2014   Procedure:  NOVASURE;  Surgeon: Emily Filbert, MD;  Location: [facility];  Service: Gynecology;  Laterality: N/A;   LUMBAR LAMINECTOMY/DECOMPRESSION MICRODISCECTOMY N/A 09/27/2019   Procedure: RIGHT LUMBAR FIVE TO SACRAL ONE MICRODISCECTOMY;  Surgeon: Marybelle Killings, MD;  Location: [facility];  Service: Orthopedics;  Laterality: N/A;   VEIN SURGERY      Family History  Problem Relation Age of Onset   Diabetes Mother    Hypertension Mother    Breast cancer Mother 82   Diabetes Father    Hypertension Father    Diabetes Other    Hypertension Other    Other Neg Hx     Social History   Socioeconomic History   Marital status: Single    Spouse name: Not on file   Number of children: Not on file   Years of education: Not on file   Highest education level: Not on file  Occupational History   Not on file  Tobacco Use   Smoking status: Every Day    Packs/day: 1.00    Years: 5.00    Total pack years: 5.00    Types: Cigarettes   Smokeless tobacco: Never   Tobacco comments:    trying to quit  Vaping Use   Vaping Use: Never used  Substance and Sexual Activity   Alcohol use: No   Drug use: Yes    Types: Marijuana    Comment: ocassional use for pain - 02/12/20   Sexual activity: Yes    Birth control/protection: Condom  Other Topics Concern   Not on file  Social History Narrative   Not on file   Social Determinants of Health   Financial Resource Strain: Not on file  Food Insecurity: Not on file  Transportation Needs: Not on file   Physical Activity: Not on file  Stress: Not on file  Social Connections: Not on file  Intimate Partner Violence: Not on file    Outpatient Medications Prior to Visit  Medication Sig Dispense Refill   amitriptyline (ELAVIL) 10 MG tablet Take 2 tablets (20 mg total) by mouth at bedtime. 60 tablet 1   atorvastatin (LIPITOR) 10 MG tablet Take 1 tablet (10 mg total) by mouth daily. 60 tablet 3   citalopram (CELEXA) 20 MG tablet Take 1 tablet (20 mg total) by mouth daily. 30 tablet 3   diclofenac (CATAFLAM) 50 MG tablet HOLD 90 tablet 2   methocarbamol (ROBAXIN) 500 MG tablet Take 1 tablet (500 mg total) by mouth every 8 (eight) hours as needed for muscle spasms. 90 tablet 3   metoCLOPramide (REGLAN) 10 MG tablet Take 1 tablet (10 mg total) by mouth every 6 (six) hours as needed for nausea (nausea/headache). 30 tablet 1   Multiple Vitamin (MULTIVITAMIN WITH MINERALS) TABS tablet Take 1 tablet by mouth daily.     omeprazole (PRILOSEC) 40 MG capsule Take 1 capsule (40 mg total) by mouth daily. 30 capsule 3   ondansetron (ZOFRAN) 4 MG tablet Take 1 tablet (4 mg total) by mouth every 8 (eight) hours as needed for nausea or vomiting. 20 tablet 0   Vitamin D, Ergocalciferol, (DRISDOL) 1.25 MG (50000 UNIT) CAPS capsule Take 1 capsule (50,000 Units total) by mouth every 7 (seven) days. 12 capsule 1   No facility-administered medications prior to visit.    Allergies  Allergen Reactions   Latex Hives   Lyrica [Pregabalin] Swelling and Other (See Comments)    Her report: tongue swelling   Tramadol Nausea Only    Feel sick    ROS Review of Systems  Constitutional: Negative.   HENT: Negative.  Negative for ear pain, postnasal drip, rhinorrhea, sinus pressure, sore throat, trouble swallowing and voice change.   Eyes: Negative.   Respiratory: Negative.  Negative  for apnea, cough, choking, chest tightness, shortness of breath, wheezing and stridor.   Cardiovascular: Negative.  Negative for chest  pain, palpitations and leg swelling.  Gastrointestinal:  Negative for abdominal distention, abdominal pain, nausea and vomiting.  Genitourinary:  Positive for pelvic pain and vaginal discharge.  Musculoskeletal:  Positive for arthralgias, back pain, gait problem, myalgias, neck pain and neck stiffness.  Skin: Negative.  Negative for rash.  Allergic/Immunologic: Negative.  Negative for environmental allergies and food allergies.  Neurological:  Negative for dizziness, syncope, weakness and headaches.  Hematological: Negative.  Negative for adenopathy. Does not bruise/bleed easily.  Psychiatric/Behavioral:  Positive for dysphoric mood. Negative for agitation, sleep disturbance and suicidal ideas. The patient is not nervous/anxious.       Objective:    Physical Exam Vitals reviewed.  Constitutional:      Appearance: Normal appearance. She is well-developed. She is obese. She is not diaphoretic.  HENT:     Head: Normocephalic and atraumatic.     Nose: No nasal deformity, septal deviation, mucosal edema or rhinorrhea.     Right Sinus: No maxillary sinus tenderness or frontal sinus tenderness.     Left Sinus: No maxillary sinus tenderness or frontal sinus tenderness.     Mouth/Throat:     Mouth: Mucous membranes are moist.     Pharynx: Oropharynx is clear. No oropharyngeal exudate.  Eyes:     General: No scleral icterus.    Conjunctiva/sclera: Conjunctivae normal.     Pupils: Pupils are equal, round, and reactive to light.  Neck:     Thyroid: No thyromegaly.     Vascular: No carotid bruit or JVD.     Trachea: Trachea normal. No tracheal tenderness or tracheal deviation.  Cardiovascular:     Rate and Rhythm: Normal rate and regular rhythm.     Chest Wall: PMI is not displaced.     Pulses: Normal pulses. No decreased pulses.     Heart sounds: Normal heart sounds, S1 normal and S2 normal. Heart sounds not distant. No murmur heard.    No systolic murmur is present.     No diastolic  murmur is present.     No friction rub. No gallop. No S3 or S4 sounds.  Pulmonary:     Effort: No tachypnea, accessory muscle usage or respiratory distress.     Breath sounds: No stridor. No decreased breath sounds, wheezing, rhonchi or rales.  Chest:     Chest wall: No tenderness.  Abdominal:     General: Bowel sounds are normal. There is no distension.     Palpations: Abdomen is soft. Abdomen is not rigid.     Tenderness: There is no abdominal tenderness. There is no guarding or rebound.  Musculoskeletal:        General: Tenderness present. Normal range of motion.     Cervical back: Normal range of motion and neck supple. No edema, erythema or rigidity. No muscular tenderness. Normal range of motion.     Comments: Posterior neck tenderness  Lymphadenopathy:     Head:     Right side of head: No submental or submandibular adenopathy.     Left side of head: No submental or submandibular adenopathy.     Cervical: No cervical adenopathy.  Skin:    General: Skin is warm and dry.     Coloration: Skin is not pale.     Findings: No rash.     Nails: There is no clubbing.  Neurological:     Mental Status:  She is alert and oriented to person, place, and time.     Sensory: No sensory deficit.  Psychiatric:        Mood and Affect: Mood normal.        Speech: Speech normal.        Behavior: Behavior normal.        Thought Content: Thought content normal.     BP 134/86   Pulse 64   Wt 182 lb (82.6 kg)   SpO2 100%   BMI 32.24 kg/m  Wt Readings from Last 3 Encounters:  02/26/22 182 lb (82.6 kg)  02/05/22 182 lb 4.8 oz (82.7 kg)  11/27/21 189 lb 12.8 oz (86.1 kg)     Health Maintenance Due  Topic Date Due   INFLUENZA VACCINE  02/12/2022     There are no preventive care reminders to display for this patient.  Lab Results  Component Value Date   TSH 0.780 04/29/2019   Lab Results  Component Value Date   WBC 7.0 11/27/2021   HGB 14.3 11/27/2021   HCT 40.0 11/27/2021    MCV 92 11/27/2021   PLT 249 11/27/2021   Lab Results  Component Value Date   NA 140 11/27/2021   K 4.6 11/27/2021   CO2 21 11/27/2021   GLUCOSE 87 11/27/2021   BUN 7 11/27/2021   CREATININE 0.83 11/27/2021   BILITOT 0.3 11/27/2021   ALKPHOS 89 11/27/2021   AST 11 11/27/2021   ALT 12 11/27/2021   PROT 7.1 11/27/2021   ALBUMIN 4.5 11/27/2021   CALCIUM 9.5 11/27/2021   ANIONGAP 7 11/10/2020   EGFR 86 11/27/2021   Lab Results  Component Value Date   CHOL 201 (H) 11/27/2021   Lab Results  Component Value Date   HDL 77 11/27/2021   Lab Results  Component Value Date   LDLCALC 108 (H) 11/27/2021   Lab Results  Component Value Date   TRIG 90 11/27/2021   Lab Results  Component Value Date   CHOLHDL 2.6 11/27/2021   Lab Results  Component Value Date   HGBA1C 5.4 03/18/2018   2021 Lumbar MRI IMPRESSION: 1. Postoperative changes at L5-S1 without evidence for residual or recurrent disc protrusion or stenosis. 2. Enhancement of the right S1 nerve root may be related to inflammation. 3. No other significant disc disease or stenosis.  2022 Cervical spine CT IMPRESSION: 1. No CT evidence for acute intracranial abnormality. 2. Straightening of the cervical spine with postsurgical changes at C5 through C7. No acute osseous abnormality.  2022 CT HEAD Negative     Assessment & Plan:   Problem List Items Addressed This Visit       Nervous and Auditory   Radiculopathy due to lumbar intervertebral disc disorder    Chronic low back pain with radiculopathy  Cervical neck pain  Paperwork filled out for patient      Relevant Medications   gabapentin (NEURONTIN) 300 MG capsule   Other Visit Diagnoses     Vitamin D deficiency disease       Relevant Medications   Vitamin D, Ergocalciferol, (DRISDOL) 1.25 MG (50000 UNIT) CAPS capsule      Meds ordered this encounter  Medications   Vitamin D, Ergocalciferol, (DRISDOL) 1.25 MG (50000 UNIT) CAPS capsule    Sig:  Take 1 capsule (50,000 Units total) by mouth every 7 (seven) days.    Dispense:  12 capsule    Refill:  1    Future refill   gabapentin (NEURONTIN) 300  MG capsule    Sig: Take 1 capsule (300 mg total) by mouth 3 (three) times daily.    Dispense:  90 capsule    Refill:  3  Follow-up: Return in about 4 months (around 06/28/2022).   Asencion Noble, MD

## 2022-02-26 NOTE — Assessment & Plan Note (Signed)
Chronic low back pain with radiculopathy  Cervical neck pain  Paperwork filled out for patient

## 2022-03-06 ENCOUNTER — Telehealth: Payer: Self-pay | Admitting: Emergency Medicine

## 2022-03-06 NOTE — Telephone Encounter (Signed)
Copied from [place] [id number]. Topic: General - Other >> Mar 06, 2022 12:31 PM Cyndi Bender wrote: Reason for CRM: Izora Gala with [employer] requests office notes from July 2021 - Nov 16, 2020. Izora Gala also stated she needs more specific details on cause. Cb# [phone]

## 2022-03-06 NOTE — Telephone Encounter (Signed)
Copied from [place] [phone]. Topic: General - Call Back - No Documentation >> Mar 06, 2022  1:11 PM Sabas Sous wrote: Reason for CRM: Pt called requesting to speak to Dr. Joya Gaskins as soon as possible regarding paperwork. She needs a call back today she says.  Best contact: [phone]

## 2022-03-07 ENCOUNTER — Encounter: Payer: Self-pay | Admitting: Critical Care Medicine

## 2022-03-07 NOTE — Telephone Encounter (Signed)
I called patient and she said she needed a letter stating the dates when she was diagnosed with the sciatic nerve problem for her insurance.

## 2022-03-07 NOTE — Telephone Encounter (Signed)
Despite the insurance company having all of our records and all this information is contained within them I have produced the letter

## 2022-03-08 NOTE — Telephone Encounter (Signed)
Called patient and she is aware

## 2022-03-08 NOTE — Telephone Encounter (Signed)
Called patient and made aware of note and also read it to her, she stated that she wanted the specific dates as to when the sciatic nerve problems began. (Is her condition including sciatic problems?)   She is aware that you are out and is ok with getting a call back Monday

## 2022-03-08 NOTE — Telephone Encounter (Signed)
Leaving note at front

## 2022-03-08 NOTE — Telephone Encounter (Signed)
The dates reflect onset of the sciatic problem  keep in mind it is an estimate as I was not her pcp when this all started , I picked her up from Dr Chapman Fitch

## 2022-03-20 NOTE — Telephone Encounter (Addendum)
Patient would like a follow up call stating the note needs to reflect the date the sciatic nerve damage started (specific date) for insurance to cover her claim. Patient states the date was between July or November of last year unable to remember the date. Patient would like to speak with PCP or nurse as soon as possible and would like the revised letter sent through My Chart.

## 2022-03-22 NOTE — Telephone Encounter (Signed)
Called patient about letter and she will pick letter up at the front
